# Patient Record
Sex: Male | Born: 1935 | Race: White | Hispanic: No | Smoking: Former smoker
Health system: Southern US, Community
[De-identification: ages and names within clinical notes are randomized; demographics above are authoritative.]

## PROBLEM LIST (undated history)

## (undated) ENCOUNTER — Inpatient Hospital Stay: Admission: AD | Payer: Medicare Other | Source: Ambulatory Visit | Admitting: Cardiovascular Disease

## (undated) DIAGNOSIS — I482 Chronic atrial fibrillation, unspecified: Secondary | ICD-10-CM

## (undated) DIAGNOSIS — I609 Nontraumatic subarachnoid hemorrhage, unspecified: Secondary | ICD-10-CM

## (undated) DIAGNOSIS — I671 Cerebral aneurysm, nonruptured: Secondary | ICD-10-CM

## (undated) DIAGNOSIS — I350 Nonrheumatic aortic (valve) stenosis: Secondary | ICD-10-CM

## (undated) DIAGNOSIS — E7800 Pure hypercholesterolemia, unspecified: Secondary | ICD-10-CM

## (undated) DIAGNOSIS — I10 Essential (primary) hypertension: Secondary | ICD-10-CM

## (undated) DIAGNOSIS — R600 Localized edema: Secondary | ICD-10-CM

## (undated) DIAGNOSIS — R001 Bradycardia, unspecified: Secondary | ICD-10-CM

## (undated) DIAGNOSIS — I2510 Atherosclerotic heart disease of native coronary artery without angina pectoris: Secondary | ICD-10-CM

## (undated) HISTORY — DX: Pure hypercholesterolemia, unspecified: E78.00

## (undated) HISTORY — DX: Essential (primary) hypertension: I10

## (undated) HISTORY — DX: Localized edema: R60.0

## (undated) HISTORY — DX: Chronic atrial fibrillation, unspecified: I48.20

## (undated) HISTORY — DX: Nonrheumatic aortic (valve) stenosis: I35.0

## (undated) SURGERY — Surgical Case
Anesthesia: *Unknown

---

## 2003-05-13 ENCOUNTER — Encounter: Payer: Self-pay | Admitting: Cardiology

## 2003-05-13 ENCOUNTER — Ambulatory Visit (HOSPITAL_COMMUNITY): Admission: RE | Admit: 2003-05-13 | Discharge: 2003-05-13 | Payer: Self-pay | Admitting: Cardiology

## 2003-08-24 ENCOUNTER — Encounter: Admission: RE | Admit: 2003-08-24 | Discharge: 2003-08-24 | Payer: Self-pay | Admitting: Dentistry

## 2003-08-30 ENCOUNTER — Inpatient Hospital Stay (HOSPITAL_COMMUNITY): Admission: RE | Admit: 2003-08-30 | Discharge: 2003-09-04 | Payer: Self-pay | Admitting: Surgery

## 2003-08-30 ENCOUNTER — Encounter (INDEPENDENT_AMBULATORY_CARE_PROVIDER_SITE_OTHER): Payer: Self-pay | Admitting: *Deleted

## 2003-08-30 HISTORY — PX: AORTIC VALVE REPLACEMENT: SHX41

## 2003-10-08 ENCOUNTER — Other Ambulatory Visit: Admission: RE | Admit: 2003-10-08 | Discharge: 2003-10-08 | Payer: Self-pay | Admitting: Pulmonary Disease

## 2003-10-19 ENCOUNTER — Encounter: Admission: RE | Admit: 2003-10-19 | Discharge: 2003-10-19 | Payer: Self-pay | Admitting: Surgery

## 2004-04-12 ENCOUNTER — Ambulatory Visit: Payer: Self-pay | Admitting: Dentistry

## 2004-05-01 ENCOUNTER — Ambulatory Visit: Payer: Self-pay | Admitting: Dentistry

## 2004-06-06 ENCOUNTER — Ambulatory Visit: Payer: Self-pay | Admitting: Internal Medicine

## 2004-06-15 ENCOUNTER — Ambulatory Visit: Payer: Self-pay

## 2004-12-27 ENCOUNTER — Ambulatory Visit: Payer: Self-pay | Admitting: *Deleted

## 2005-01-22 ENCOUNTER — Ambulatory Visit: Payer: Self-pay | Admitting: Cardiology

## 2005-01-23 ENCOUNTER — Ambulatory Visit: Payer: Self-pay | Admitting: Cardiology

## 2005-02-13 ENCOUNTER — Ambulatory Visit: Payer: Self-pay | Admitting: *Deleted

## 2005-03-13 ENCOUNTER — Ambulatory Visit: Payer: Self-pay | Admitting: Cardiology

## 2005-04-24 ENCOUNTER — Ambulatory Visit: Payer: Self-pay | Admitting: Internal Medicine

## 2005-05-08 ENCOUNTER — Ambulatory Visit: Payer: Self-pay | Admitting: Internal Medicine

## 2005-06-05 ENCOUNTER — Ambulatory Visit: Payer: Self-pay | Admitting: Cardiology

## 2005-06-07 ENCOUNTER — Ambulatory Visit: Payer: Self-pay | Admitting: Cardiology

## 2005-06-19 ENCOUNTER — Ambulatory Visit: Payer: Self-pay | Admitting: Internal Medicine

## 2005-07-06 ENCOUNTER — Ambulatory Visit: Payer: Self-pay | Admitting: Cardiovascular Disease

## 2005-12-24 ENCOUNTER — Ambulatory Visit: Payer: Self-pay | Admitting: Internal Medicine

## 2005-12-24 ENCOUNTER — Ambulatory Visit: Payer: Self-pay | Admitting: Cardiovascular Disease

## 2005-12-24 ENCOUNTER — Ambulatory Visit: Admission: RE | Admit: 2005-12-24 | Discharge: 2006-02-07 | Payer: Self-pay | Admitting: Radiation Oncology

## 2006-01-24 ENCOUNTER — Ambulatory Visit: Payer: Self-pay | Admitting: Cardiology

## 2006-02-14 ENCOUNTER — Ambulatory Visit: Payer: Self-pay | Admitting: Cardiology

## 2006-03-01 ENCOUNTER — Ambulatory Visit: Payer: Self-pay | Admitting: Cardiology

## 2006-04-01 ENCOUNTER — Ambulatory Visit: Payer: Self-pay | Admitting: Cardiology

## 2006-04-01 ENCOUNTER — Ambulatory Visit: Admission: RE | Admit: 2006-04-01 | Discharge: 2006-06-30 | Payer: Self-pay | Admitting: Radiation Oncology

## 2006-04-16 ENCOUNTER — Ambulatory Visit: Payer: Self-pay | Admitting: Cardiovascular Disease

## 2006-04-30 ENCOUNTER — Ambulatory Visit: Payer: Self-pay | Admitting: Cardiovascular Disease

## 2006-05-10 ENCOUNTER — Ambulatory Visit: Payer: Self-pay | Admitting: Cardiovascular Disease

## 2006-05-28 ENCOUNTER — Ambulatory Visit: Payer: Self-pay | Admitting: Internal Medicine

## 2006-05-31 ENCOUNTER — Ambulatory Visit: Payer: Self-pay | Admitting: Cardiovascular Disease

## 2006-06-12 ENCOUNTER — Ambulatory Visit: Payer: Self-pay | Admitting: Cardiology

## 2006-11-28 ENCOUNTER — Ambulatory Visit: Payer: Self-pay

## 2006-12-05 ENCOUNTER — Ambulatory Visit: Payer: Self-pay | Admitting: Cardiovascular Disease

## 2006-12-17 ENCOUNTER — Ambulatory Visit: Payer: Self-pay | Admitting: Cardiology

## 2007-01-02 ENCOUNTER — Ambulatory Visit: Payer: Self-pay | Admitting: *Deleted

## 2007-01-30 ENCOUNTER — Ambulatory Visit: Payer: Self-pay | Admitting: Cardiology

## 2007-02-20 ENCOUNTER — Ambulatory Visit: Payer: Self-pay | Admitting: Cardiology

## 2007-03-20 ENCOUNTER — Ambulatory Visit: Payer: Self-pay | Admitting: Cardiovascular Disease

## 2007-04-11 ENCOUNTER — Ambulatory Visit: Payer: Self-pay | Admitting: Cardiology

## 2007-05-09 ENCOUNTER — Ambulatory Visit: Payer: Self-pay | Admitting: Internal Medicine

## 2007-06-06 ENCOUNTER — Ambulatory Visit: Payer: Self-pay | Admitting: Cardiology

## 2007-07-04 ENCOUNTER — Ambulatory Visit: Payer: Self-pay | Admitting: Internal Medicine

## 2007-12-02 ENCOUNTER — Ambulatory Visit: Payer: Self-pay | Admitting: Cardiology

## 2007-12-22 ENCOUNTER — Ambulatory Visit: Payer: Self-pay | Admitting: Cardiovascular Disease

## 2007-12-30 ENCOUNTER — Ambulatory Visit: Payer: Self-pay | Admitting: Cardiology

## 2008-01-20 ENCOUNTER — Ambulatory Visit: Payer: Self-pay | Admitting: Cardiology

## 2008-02-19 ENCOUNTER — Ambulatory Visit: Payer: Self-pay | Admitting: Cardiovascular Disease

## 2008-02-26 ENCOUNTER — Ambulatory Visit: Payer: Self-pay

## 2008-02-26 ENCOUNTER — Encounter: Payer: Self-pay | Admitting: Cardiovascular Disease

## 2008-03-04 ENCOUNTER — Ambulatory Visit: Payer: Self-pay | Admitting: Cardiology

## 2008-03-23 ENCOUNTER — Ambulatory Visit: Payer: Self-pay | Admitting: Cardiology

## 2008-04-23 ENCOUNTER — Ambulatory Visit: Payer: Self-pay | Admitting: Cardiology

## 2008-05-07 ENCOUNTER — Ambulatory Visit: Payer: Self-pay | Admitting: Cardiovascular Disease

## 2008-06-09 ENCOUNTER — Ambulatory Visit: Payer: Self-pay | Admitting: Cardiovascular Disease

## 2008-06-18 ENCOUNTER — Ambulatory Visit: Payer: Self-pay | Admitting: Cardiovascular Disease

## 2008-12-22 ENCOUNTER — Ambulatory Visit: Payer: Self-pay | Admitting: Cardiology

## 2009-01-04 ENCOUNTER — Encounter: Payer: Self-pay | Admitting: *Deleted

## 2009-01-06 ENCOUNTER — Ambulatory Visit: Payer: Self-pay | Admitting: Internal Medicine

## 2009-01-06 LAB — CONVERTED CEMR LAB: POC INR: 2.1

## 2009-01-26 ENCOUNTER — Encounter (INDEPENDENT_AMBULATORY_CARE_PROVIDER_SITE_OTHER): Payer: Self-pay | Admitting: Pharmacist

## 2009-01-26 ENCOUNTER — Ambulatory Visit: Payer: Self-pay | Admitting: Internal Medicine

## 2009-02-09 ENCOUNTER — Encounter: Payer: Self-pay | Admitting: *Deleted

## 2009-03-01 ENCOUNTER — Ambulatory Visit: Payer: Self-pay | Admitting: Internal Medicine

## 2009-03-01 LAB — CONVERTED CEMR LAB: POC INR: 2.7

## 2009-04-14 ENCOUNTER — Ambulatory Visit: Payer: Self-pay | Admitting: Cardiovascular Disease

## 2009-05-26 ENCOUNTER — Ambulatory Visit: Payer: Self-pay | Admitting: Cardiovascular Disease

## 2009-06-20 ENCOUNTER — Ambulatory Visit: Payer: Self-pay | Admitting: Cardiology

## 2009-06-20 LAB — CONVERTED CEMR LAB: POC INR: 2.3

## 2009-09-01 ENCOUNTER — Encounter: Payer: Self-pay | Admitting: Internal Medicine

## 2009-12-19 ENCOUNTER — Ambulatory Visit: Payer: Self-pay | Admitting: Cardiovascular Disease

## 2009-12-19 LAB — CONVERTED CEMR LAB: POC INR: 1.6

## 2009-12-23 ENCOUNTER — Telehealth: Payer: Self-pay | Admitting: Cardiovascular Disease

## 2009-12-27 ENCOUNTER — Ambulatory Visit: Payer: Self-pay | Admitting: Cardiovascular Disease

## 2009-12-27 DIAGNOSIS — Z952 Presence of prosthetic heart valve: Secondary | ICD-10-CM

## 2009-12-27 DIAGNOSIS — R609 Edema, unspecified: Secondary | ICD-10-CM

## 2010-01-09 ENCOUNTER — Ambulatory Visit: Payer: Self-pay | Admitting: Cardiology

## 2010-02-01 ENCOUNTER — Ambulatory Visit: Payer: Self-pay | Admitting: Cardiology

## 2010-02-22 ENCOUNTER — Ambulatory Visit: Payer: Self-pay | Admitting: Cardiology

## 2010-02-22 LAB — CONVERTED CEMR LAB: POC INR: 2.3

## 2010-03-22 ENCOUNTER — Ambulatory Visit: Payer: Self-pay | Admitting: Cardiology

## 2010-04-21 ENCOUNTER — Ambulatory Visit: Payer: Self-pay | Admitting: Cardiology

## 2010-05-19 ENCOUNTER — Ambulatory Visit: Payer: Self-pay | Admitting: Cardiology

## 2010-06-08 ENCOUNTER — Telehealth (INDEPENDENT_AMBULATORY_CARE_PROVIDER_SITE_OTHER): Payer: Self-pay | Admitting: *Deleted

## 2010-06-21 ENCOUNTER — Telehealth (INDEPENDENT_AMBULATORY_CARE_PROVIDER_SITE_OTHER): Payer: Self-pay | Admitting: *Deleted

## 2010-06-21 ENCOUNTER — Ambulatory Visit: Payer: Self-pay | Admitting: Cardiovascular Disease

## 2010-06-21 ENCOUNTER — Encounter: Payer: Self-pay | Admitting: Cardiovascular Disease

## 2010-06-21 ENCOUNTER — Encounter: Payer: Self-pay | Admitting: Cardiology

## 2010-06-21 LAB — CONVERTED CEMR LAB: POC INR: 2.3

## 2010-09-05 NOTE — Medication Information (Signed)
Summary: Coumadin Clinic  Anticoagulant Therapy  Managed by: Cloyde Reams, RN, BSN Referring MD: Lewayne Bunting Supervising MD: Graciela Husbands MD, Viviann Spare Indication 1: Atrial Fibrillation (ICD-427.31) Lab Used: [facility] Site: [facility] INR RANGE 2 - 3          Comments: Pt is in [place] until end of April, 1st of May, having PT/INR's monitored there.  Faxed PT/[facility] to [facility] in [facility] [phone]#, Dr Aram Candela.  Requested copy of PT/INR results for there for records.    Allergies: 1)  ! Codeine 2)  ! * Bee Stings  Anticoagulation Management History:      The patient is taking warfarin and comes in today for a routine follow up visit.  Positive risk factors for bleeding include an age of 60 years or older.  The bleeding index is 'intermediate risk'.  Positive CHADS2 values include Age > 11 years old.  The start date was 04/13/2004.  Anticoagulation responsible provider: Graciela Husbands MD, Viviann Spare.  Exp: 05/2010.    Anticoagulation Management Assessment/Plan:      The patient's current anticoagulation dose is Coumadin 5 mg tabs: Take as directed by coumadin clinic..  The target INR is 2.0-3.0.  The next INR is due 12/09/2009.  Anticoagulation instructions were given to patient.  Results were reviewed/authorized by Cloyde Reams, RN, BSN.  He was notified by Cloyde Reams RN.         Prior Anticoagulation Instructions: INR 2.3  Continue to take 1 tablet every day except on Monday, Wednesday, and Friday take 1/2 tablet. Recheck INR on 12/2.  Current Anticoagulation Instructions: Pt will return from [facility] end of April, 1st of May.

## 2010-09-05 NOTE — Progress Notes (Signed)
  Phone Note Other Incoming   Request: Send information Summary of Call: Received a completed [place] medical release form. Patient is going to [facility] for the summer and is requesting records from 12/2009 to present from Dr. Eden Emms along with coumadin labs. Kim to process after patient's appointment with Dr. Eden Emms on 06/21/2010.

## 2010-09-05 NOTE — Medication Information (Signed)
Summary: rov/sl   Anticoagulant Therapy  Managed by: Weston Brass, PharmD Referring MD: Lewayne Bunting Supervising MD: Myrtis Ser MD, Tinnie Gens Indication 1: Atrial Fibrillation (ICD-427.31) Lab Used: [facility] Site: [facility] INR POC 2.3 INR RANGE 2 - 3  Dietary changes: no    Health status changes: no    Bleeding/hemorrhagic complications: no    Recent/future hospitalizations: no    Any changes in medication regimen? no    Recent/future dental: no  Any missed doses?: no       Is patient compliant with meds? yes       Allergies: 1)  ! Codeine 2)  ! * Bee Stings  Anticoagulation Management History:      The patient is taking warfarin and comes in today for a routine follow up visit.  Positive risk factors for bleeding include an age of 26 years or older.  The bleeding index is 'intermediate risk'.  Positive CHADS2 values include Age > 20 years old.  The start date was 04/13/2004.  Anticoagulation responsible provider: Myrtis Ser MD, Tinnie Gens.  INR POC: 2.3.  Cuvette Lot#: [id number].  Exp: 06/2011.    Anticoagulation Management Assessment/Plan:      The patient's current anticoagulation dose is Coumadin 5 mg tabs: Take as directed by coumadin clinic..  The target INR is 2.0-3.0.  The next INR is due 07/19/2010.  Anticoagulation instructions were given to patient.  Results were reviewed/authorized by Weston Brass, PharmD.  He was notified by Weston Brass PharmD.         Prior Anticoagulation Instructions: INR 2.0  Continue taking Coumadin 1 tab (5 mg) on Sun, Tues, Thur, Sat and Coumadin 0.5 tab (2.5 mg) on Mon, Wed, Fri. Return to clinic in 4 weeks.   Current Anticoagulation Instructions: INR 2.3  Continue same dose of 1 tablet every day except 1/2 tablet on Monday, Wednesday and Friday.  Recheck INR in 4 weeks.

## 2010-09-05 NOTE — Medication Information (Signed)
Summary: rov/tm  Anticoagulant Therapy  Managed by: Bethena Midget, RN, BSN Referring MD: Lewayne Bunting Supervising MD: Clifton James MD, Cristal Deer Indication 1: Atrial Fibrillation (ICD-427.31) Lab Used: [facility] Site: [facility] INR POC 1.6 INR RANGE 2 - 3  Dietary changes: no    Health status changes: no    Bleeding/hemorrhagic complications: no    Recent/future hospitalizations: no    Any changes in medication regimen? no    Recent/future dental: no  Any missed doses?: yes     Details: missed a dose a week ago  Is patient compliant with meds? yes      Comments: Pt states he's been taking 2.5mg s daily except 5mg s on  MWF. Suggested 2 wk RTC, he refuses and only agrees to 3 weeks. He is aware of risk.   Allergies: 1)  ! Codeine 2)  ! * Bee Stings  Anticoagulation Management History:      The patient is taking warfarin and comes in today for a routine follow up visit.  Positive risk factors for bleeding include an age of 75 years or older.  The bleeding index is 'intermediate risk'.  Positive CHADS2 values include Age > 71 years old.  The start date was 04/13/2004.  Anticoagulation responsible provider: Clifton James MD, Cristal Deer.  INR POC: 1.6.  Cuvette Lot#: [id number].  Exp: 03/2011.    Anticoagulation Management Assessment/Plan:      The patient's current anticoagulation dose is Coumadin 5 mg tabs: Take as directed by coumadin clinic..  The target INR is 2.0-3.0.  The next INR is due 01/09/2010.  Anticoagulation instructions were given to patient.  Results were reviewed/authorized by Bethena Midget, RN, BSN.  He was notified by Bethena Midget, RN, BSN.         Prior Anticoagulation Instructions: Pt will return from [facility] end of April, 1st of May.    Current Anticoagulation Instructions: INR 1.6 Today take extra 2.5mg s then resume 2.5mg  everyday except 5mg  on Mondays, Wednesdays and Fridays. Recheck in 3 weeks.

## 2010-09-05 NOTE — Medication Information (Signed)
Summary: rov/cb  Anticoagulant Therapy  Managed by: Weston Brass, PharmD Referring MD: Lewayne Bunting Supervising MD: Shirlee Latch MD, Freida Busman Indication 1: Atrial Fibrillation (ICD-427.31) Lab Used: [facility] Site: [facility] INR POC 2.3 INR RANGE 2 - 3  Dietary changes: no    Health status changes: no    Bleeding/hemorrhagic complications: no    Recent/future hospitalizations: no    Any changes in medication regimen? no    Recent/future dental: no  Any missed doses?: no       Is patient compliant with meds? yes       Allergies: 1)  ! Codeine 2)  ! * Bee Stings  Anticoagulation Management History:      The patient is taking warfarin and comes in today for a routine follow up visit.  Positive risk factors for bleeding include an age of 75 years or older.  The bleeding index is 'intermediate risk'.  Positive CHADS2 values include Age > 14 years old.  The start date was 04/13/2004.  Anticoagulation responsible provider: Shirlee Latch MD, Sanaiya Welliver.  INR POC: 2.3.  Cuvette Lot#: [id number].  Exp: 05/2011.    Anticoagulation Management Assessment/Plan:      The patient's current anticoagulation dose is Coumadin 5 mg tabs: Take as directed by coumadin clinic..  The target INR is 2.0-3.0.  The next INR is due 03/22/2010.  Anticoagulation instructions were given to patient.  Results were reviewed/authorized by Weston Brass, PharmD.  He was notified by Dillard Cannon.         Prior Anticoagulation Instructions: INR 2.9. Take 1 tablet daily except 0.5 tablet on Mon, Wed, and Fri. Recheck in   Current Anticoagulation Instructions: INR 2.3  Take 1 tablet daily except for 1/2 tab on Monday, Wednesday, and Friday.  Re-check in 4 weeks.

## 2010-09-05 NOTE — Medication Information (Signed)
Summary: rov/tm  Anticoagulant Therapy  Managed by: Cloyde Reams, RN, BSN Referring MD: Lewayne Bunting Supervising MD: Myrtis Ser MD, Tinnie Gens Indication 1: Atrial Fibrillation (ICD-427.31) Lab Used: [facility] Site: [facility] INR POC 2.7 INR RANGE 2 - 3  Dietary changes: no    Health status changes: no    Bleeding/hemorrhagic complications: no    Recent/future hospitalizations: no    Any changes in medication regimen? no    Recent/future dental: no  Any missed doses?: no       Is patient compliant with meds? yes       Allergies: 1)  ! Codeine 2)  ! * Bee Stings  Anticoagulation Management History:      The patient is taking warfarin and comes in today for a routine follow up visit.  Positive risk factors for bleeding include an age of 75 years or older.  The bleeding index is 'intermediate risk'.  Positive CHADS2 values include Age > 75 years old.  The start date was 04/13/2004.  Anticoagulation responsible provider: Myrtis Ser MD, Tinnie Gens.  INR POC: 2.7.  Cuvette Lot#: [id number].  Exp: 05/2011.    Anticoagulation Management Assessment/Plan:      The patient's current anticoagulation dose is Coumadin 5 mg tabs: Take as directed by coumadin clinic..  The target INR is 2.0-3.0.  The next INR is due 05/19/2010.  Anticoagulation instructions were given to patient.  Results were reviewed/authorized by Cloyde Reams, RN, BSN.  He was notified by Cloyde Reams RN.         Prior Anticoagulation Instructions: INR 3.0 Continue 5mg s daily except 2.5mg s on Mondays, Wednesdays and Fridays. Recheck in 4 weeks.   Current Anticoagulation Instructions: INR 2.7  Continue on same dosage 5mg  daily except 2.5mg  on Mondays, Wednesdays, and Fridays.  Recheck in 4 weeks.

## 2010-09-05 NOTE — Progress Notes (Signed)
Summary: appt   Phone Note Call from Patient Call back at Home Phone [phone]   Caller: Spouse Reason for Call: Talk to Nurse Summary of Call: wife has appt on 5/24 w/ Dr Eden Emms, he wants to come then as well Initial call taken by: Migdalia Dk,  Dec 23, 2009 11:51 AM  Follow-up for Phone Call        12/23/09--1200noon--pt's husband calling stating he would like to be seen same day as wife is coming to see dr Dorann Lodge we are fully booked that day, but will send mess to debra mathis and if dr Eden Emms can fit him in she'll give him a call--nt Follow-up by: Ledon Snare, RN,  Dec 23, 2009 12:01 PM     Appended Document: appt Put him in at 2:30 this Tuesday

## 2010-09-05 NOTE — Medication Information (Signed)
Summary: rov/ln  Anticoagulant Therapy  Managed by: Bethena Midget, RN, BSN Referring MD: Lewayne Bunting Supervising MD: Juanda Chance MD, Bruce Indication 1: Atrial Fibrillation (ICD-427.31) Lab Used: [facility] Site: [facility] INR POC 3.0 INR RANGE 2 - 3  Dietary changes: no    Health status changes: no    Bleeding/hemorrhagic complications: no    Recent/future hospitalizations: no    Any changes in medication regimen? no    Recent/future dental: no  Any missed doses?: no       Is patient compliant with meds? yes      Comments: Pt and wife both verbalize that they have had some dark stool the past few days. They think it was something that they ate. Encouraged them to continue to observe stool and if it dosen't return to normal color to call PCP for possible guiac.   Allergies: 1)  ! Codeine 2)  ! * Bee Stings  Anticoagulation Management History:      The patient is taking warfarin and comes in today for a routine follow up visit.  Positive risk factors for bleeding include an age of 31 years or older.  The bleeding index is 'intermediate risk'.  Positive CHADS2 values include Age > 38 years old.  The start date was 04/13/2004.  Anticoagulation responsible Jason Hawkins: Juanda Chance MD, Smitty Cords.  INR POC: 3.0.  Cuvette Lot#: [id number].  Exp: 05/2011.    Anticoagulation Management Assessment/Plan:      The patient's current anticoagulation dose is Coumadin 5 mg tabs: Take as directed by coumadin clinic..  The target INR is 2.0-3.0.  The next INR is due 04/21/2010.  Anticoagulation instructions were given to patient.  Results were reviewed/authorized by Bethena Midget, RN, BSN.  He was notified by Bethena Midget, RN, BSN.         Prior Anticoagulation Instructions: INR 2.3  Take 1 tablet daily except for 1/2 tab on Monday, Wednesday, and Friday.  Re-check in 4 weeks.  Current Anticoagulation Instructions: INR 3.0 Continue 5mg s daily except 2.5mg s on Mondays, Wednesdays and Fridays. Recheck in 4  weeks.

## 2010-09-05 NOTE — Medication Information (Signed)
Summary: Coumadin Clinic   Anticoagulant Therapy  Managed by: Inactive Referring MD: Lewayne Bunting Supervising MD: Myrtis Ser MD, Tinnie Gens Indication 1: Atrial Fibrillation (ICD-427.31) Lab Used: [facility] Site: [facility] INR RANGE 2 - 3          Comments: Pt going to [facility] for the winter.  Will call when back in [place].   Allergies: 1)  ! Codeine 2)  ! * Bee Stings  Anticoagulation Management History:      Positive risk factors for bleeding include an age of 75 years or older.  The bleeding index is 'intermediate risk'.  Positive CHADS2 values include Age > 75 years old.  The start date was 04/13/2004.  Anticoagulation responsible provider: Myrtis Ser MD, Tinnie Gens.  Exp: 06/2011.    Anticoagulation Management Assessment/Plan:      The patient's current anticoagulation dose is Coumadin 5 mg tabs: Take as directed by coumadin clinic..  The target INR is 2.0-3.0.  The next INR is due 07/19/2010.  Anticoagulation instructions were given to patient.  Results were reviewed/authorized by Inactive.         Prior Anticoagulation Instructions: INR 2.3  Continue same dose of 1 tablet every day except 1/2 tablet on Monday, Wednesday and Friday.  Recheck INR in 4 weeks.

## 2010-09-05 NOTE — Medication Information (Signed)
Summary: rov/tm  Anticoagulant Therapy  Managed by: Weston Brass, PharmD Referring MD: Lewayne Bunting Supervising MD: Antoine Poche MD, Fayrene Fearing Indication 1: Atrial Fibrillation (ICD-427.31) Lab Used: [facility] Site: [facility] INR POC 1.7 INR RANGE 2 - 3  Dietary changes: no    Health status changes: no    Bleeding/hemorrhagic complications: no    Recent/future hospitalizations: no    Any changes in medication regimen? no    Recent/future dental: no  Any missed doses?: yes     Details: may have missed a dose but unsure   Is patient compliant with meds? yes       Allergies: 1)  ! Codeine 2)  ! * Bee Stings  Anticoagulation Management History:      The patient is taking warfarin and comes in today for a routine follow up visit.  Positive risk factors for bleeding include an age of 5 years or older.  The bleeding index is 'intermediate risk'.  Positive CHADS2 values include Age > 55 years old.  The start date was 04/13/2004.  Anticoagulation responsible provider: Antoine Poche MD, Fayrene Fearing.  INR POC: 1.7.  Cuvette Lot#: [id number].  Exp: 03/2011.    Anticoagulation Management Assessment/Plan:      The patient's current anticoagulation dose is Coumadin 5 mg tabs: Take as directed by coumadin clinic..  The target INR is 2.0-3.0.  The next INR is due 01/30/2010.  Anticoagulation instructions were given to patient.  Results were reviewed/authorized by Weston Brass, PharmD.  He was notified by Weston Brass PharmD.         Prior Anticoagulation Instructions: INR 1.6 Today take extra 2.5mg s then resume 2.5mg  everyday except 5mg  on Mondays, Wednesdays and Fridays. Recheck in 3 weeks.   Current Anticoagulation Instructions: INR 1.7  Take extra 1/2 tablet today then increase dose to 1 tablet every day except 1/2 tablet on Monday, Wednesday and Friday

## 2010-09-05 NOTE — Progress Notes (Signed)
   Pt signed ROI 06/08/10.Marland KitchenMarland KitchenMailed Records to pt today  [facility]  June 21, 2010 12:18 PM

## 2010-09-05 NOTE — Medication Information (Signed)
Summary: rov/ewj      Allergies Added:  Anticoagulant Therapy  Managed by: Reina Fuse, PharmD Referring MD: Lewayne Bunting Supervising MD: Riley Kill MD, Maisie Fus Indication 1: Atrial Fibrillation (ICD-427.31) Lab Used: [facility] Site: [facility] INR POC 2.0 INR RANGE 2 - 3  Dietary changes: yes       Details: Had cabbage last night.   Health status changes: no    Bleeding/hemorrhagic complications: no    Recent/future hospitalizations: no    Any changes in medication regimen? no    Recent/future dental: no  Any missed doses?: no       Is patient compliant with meds? yes       Current Medications (verified): 1)  Coumadin 5 Mg Tabs (Warfarin Sodium) .... Take As Directed By Coumadin Clinic. 2)  Toprol Xl 50 Mg Xr24h-Tab (Metoprolol Succinate) .[name].. 1 Tablet Daily 3)  Red Yeast Rice 600 Mg Tabs (Red Yeast Rice Extract) .[name].. 1 Tab By Mouth Once Daily  Allergies (verified): 1)  ! Codeine 2)  ! * Bee Stings  Anticoagulation Management History:      The patient is taking warfarin and comes in today for a routine follow up visit.  Positive risk factors for bleeding include an age of 75 years or older.  The bleeding index is 'intermediate risk'.  Positive CHADS2 values include Age > 30 years old.  The start date was 04/13/2004.  Anticoagulation responsible provider: Riley Kill MD, Maisie Fus.  INR POC: 2.0.  Cuvette Lot#: [id number].  Exp: 05/2011.    Anticoagulation Management Assessment/Plan:      The patient's current anticoagulation dose is Coumadin 5 mg tabs: Take as directed by coumadin clinic..  The target INR is 2.0-3.0.  The next INR is due 06/16/2010.  Anticoagulation instructions were given to patient.  Results were reviewed/authorized by Reina Fuse, PharmD.  He was notified by Reina Fuse PharmD.         Prior Anticoagulation Instructions: INR 2.7  Continue on same dosage 5mg  daily except 2.5mg  on Mondays, Wednesdays, and Fridays.  Recheck in 4 weeks.    Current Anticoagulation  Instructions: INR 2.0  Continue taking Coumadin 1 tab (5 mg) on Sun, Tues, Thur, Sat and Coumadin 0.5 tab (2.5 mg) on Mon, Wed, Fri. Return to clinic in 4 weeks.

## 2010-09-05 NOTE — Assessment & Plan Note (Signed)
Summary: f12m/dm  Medications Added * TURMERIC 1 tab by mouth once daily GLUCOSAMINE-CHONDROITIN   CAPS (GLUCOSAMINE-CHONDROIT-VIT C-MN) 1 tab by mouth once daily MULTIVITAMINS   TABS (MULTIPLE VITAMIN) 1 tab by mouth once daily HYALURONIC ACID 20-60 MG CAPS (HYALURONIC ACID-VITAMIN C) 1  tab by mouth once daily      Allergies Added:   CC:  check up...pt states he does not take crestor.  History of Present Illness: Jason Hawkins is seen today for F/U of AVR and coumadin and afib.  He has some dementia.  He has not had any TIA's or bleeding complications although he forgets his coumadin from time to time.   He denies palpitations, SSCP, dyspnea or syncope.  He broke his left tibia in his 22" and has mild chronic LE edema.   Reviewed recentl lab work which was good.  Both he and his wife are now on Vit D  Current Problems (verified): 1)  Edema  (ICD-782.3) 2)  Coumadin Therapy  (ICD-V58.61) 3)  Atrial Fibrillation  (ICD-427.31) 4)  Aortic Valve Replacement, Hx of  (ICD-V43.3)  Current Medications (verified): 1)  Coumadin 5 Mg Tabs (Warfarin Sodium) .... Take As Directed By Coumadin Clinic. 2)  Toprol Xl 50 Mg Xr24h-Tab (Metoprolol Succinate) .[name].. 1 Tablet Daily 3)  Red Yeast Rice 600 Mg Tabs (Red Yeast Rice Extract) .[name].. 1 Tab By Mouth Once Daily 4)  Turmeric .[name].. 1 Tab By Mouth Once Daily 5)  Glucosamine-Chondroitin   Caps (Glucosamine-Chondroit-Vit C-Mn) .[name].. 1 Tab By Mouth Once Daily 6)  Multivitamins   Tabs (Multiple Vitamin) .[name].. 1 Tab By Mouth Once Daily 7)  Hyaluronic Acid 20-60 Mg Caps (Hyaluronic Acid-Vitamin C) .[name].. 1  Tab By Mouth Once Daily  Allergies (verified): 1)  ! Codeine 2)  ! * Bee Stings  Past History:  Past Medical History: Last updated: 12/26/2009 Chronic atrial fibrillation  Severe aortic stenosis status post aortic valve replacement using a pericardial tissue valve.  hypertension  Tissue aortic valve. Lower extremity edema History of  hypercholesterolemia.  Past Surgical History: Last updated: 12/26/2009  Median sternotomy, extracorporeal circulation, aortic valve replacement using a 25 mm Edwards Magna pericardial valve. SURGEON:  Alleen Borne, M.D.         BB/MEDQ  D:  08/30/2003  T:  08/30/2003  Job:  [id number]  cc:   [name], M.D.   August 30, 2003, the patient underwent a transesophageal echocardiographic probe placement and monitoring during aortic valve replacement.  Findings showed prosthetic aortic valve was working well.  Family History: Last updated: 12/26/2009 DM MI  Social History: Last updated: 06/21/2010 Tobacco Use - No.  Alcohol Use - yes Married Sedentary Lives in [place] during winters Wife [name] also patient of Dr Eden Emms  Social History: Tobacco Use - No.  Alcohol Use - yes Married Sedentary Lives in [place] during winters Wife [name] also patient of Dr Eden Emms  Vital Signs:  Patient profile:   77 year old male Height:      70 inches Weight:      196 pounds BMI:     28.22 Pulse rate:   75 / minute Resp:     14 per minute BP sitting:   133 / 73  (left arm)  Vitals Entered By: Kem Parkinson (June 21, 2010 9:30 AM)  Physical Exam  General:  Affect appropriate Healthy:  appears stated age HEENT: normal Neck supple with no adenopathy JVP normal no bruits no thyromegaly Lungs clear with no wheezing and good diaphragmatic  motion Heart:  S1/S2 prominant no AR  no murmur,rub, gallop or click PMI normal Abdomen: benighn, BS positve, no tenderness, no AAA no bruit.  No HSM or HJR Distal pulses intact with no bruits No edema Neuro non-focal Skin warm and dry    Impression & Recommendations:  Problem # 1:  EDEMA (ICD-782.3) Stable continue low sodium diet and as needed diuretic  Problem # 2:  AORTIC VALVE REPLACEMENT, HX OF (ICD-V43.3) Normal exam  F/U coumadin clinic.  SBE prophylaxis  Problem # 5:  ATRIAL FIBRILLATION (ICD-427.31) Good rate control  Asymptomatic  Still good candidate for coumadin His updated medication list for this problem includes:    Coumadin 5 Mg Tabs (Warfarin sodium) .[name]... Take as directed by coumadin clinic.    Toprol Xl 50 Mg Xr24h-tab (Metoprolol succinate) .[name]... 1 tablet daily  Orders: EKG w/ Interpretation (93000)  Patient Instructions: 1)  Your physician recommends that you schedule a follow-up appointment in: 6 MONTHS WITH DR Eden Emms 2)  Your physician recommends that you continue on your current medications as directed. Please refer to the Current Medication list given to you today.

## 2010-09-05 NOTE — Medication Information (Signed)
Summary: rov/sp  Anticoagulant Therapy  Managed by: Elaina Pattee, PharmD Referring MD: Lewayne Bunting Supervising MD: Riley Kill MD, Maisie Fus Indication 1: Atrial Fibrillation (ICD-427.31) Lab Used: [facility] Site: [facility] INR POC 2.9 INR RANGE 2 - 3  Dietary changes: no    Health status changes: no    Bleeding/hemorrhagic complications: no    Recent/future hospitalizations: no    Any changes in medication regimen? no    Recent/future dental: no  Any missed doses?: no       Is patient compliant with meds? yes       Allergies: 1)  ! Codeine 2)  ! * Bee Stings  Anticoagulation Management History:      The patient is taking warfarin and comes in today for a routine follow up visit.  Positive risk factors for bleeding include an age of 75 years or older.  The bleeding index is 'intermediate risk'.  Positive CHADS2 values include Age > 75 years old.  The start date was 04/13/2004.  Anticoagulation responsible provider: Riley Kill MD, Maisie Fus.  INR POC: 2.9.  Cuvette Lot#: [id number].  Exp: 03/2011.    Anticoagulation Management Assessment/Plan:      The patient's current anticoagulation dose is Coumadin 5 mg tabs: Take as directed by coumadin clinic..  The target INR is 2.0-3.0.  The next INR is due 02/22/2010.  Anticoagulation instructions were given to patient.  Results were reviewed/authorized by Elaina Pattee, PharmD.  He was notified by Elaina Pattee, PharmD.         Prior Anticoagulation Instructions: INR 1.7  Take extra 1/2 tablet today then increase dose to 1 tablet every day except 1/2 tablet on Monday, Wednesday and Friday  Current Anticoagulation Instructions: INR 2.9. Take 1 tablet daily except 0.5 tablet on Mon, Wed, and Fri. Recheck in

## 2010-09-05 NOTE — Assessment & Plan Note (Signed)
Summary: rov/jss  Medications Added RED YEAST RICE 600 MG TABS (RED YEAST RICE EXTRACT) 1 tab by mouth once daily      Allergies Added:   History of Present Illness: Jason Hawkins is seen today for F/U of AVR and coumadin and afib.  His wife who I also care for has been very sick with respitory failure and pneumonia.  She was in the hospital for about 2 weeks recently.  Xzaviar is also slowing down.  He has some dementia.  He has not had any TIA's or bleeding complications although he forgets his coumadin from time to time.  A lot of this has to do with travel and being with his wife in the hospital.  He denies palpitations, SSCP, dyspnea or syncope.  He broke his left tibia in his 5" and has mild chronic LE edema.    Current Problems (verified): 1)  Edema  (ICD-782.3) 2)  Coumadin Therapy  (ICD-V58.61) 3)  Atrial Fibrillation  (ICD-427.31) 4)  Aortic Valve Replacement, Hx of  (ICD-V43.3)  Current Medications (verified): 1)  Coumadin 5 Mg Tabs (Warfarin Sodium) .... Take As Directed By Coumadin Clinic. 2)  Toprol Xl 50 Mg Xr24h-Tab (Metoprolol Succinate) .[name].. 1 Tablet Daily 3)  Red Yeast Rice 600 Mg Tabs (Red Yeast Rice Extract) .[name].. 1 Tab By Mouth Once Daily  Allergies (verified): 1)  ! Codeine 2)  ! * Bee Stings  Past History:  Past Medical History: Last updated: 12/26/2009 Chronic atrial fibrillation  Severe aortic stenosis status post aortic valve replacement using a pericardial tissue valve.  hypertension  Tissue aortic valve. Lower extremity edema History of hypercholesterolemia.  Past Surgical History: Last updated: 12/26/2009  Median sternotomy, extracorporeal circulation, aortic valve replacement using a 25 mm Edwards Magna pericardial valve. SURGEON:  Alleen Borne, M.D.         BB/MEDQ  D:  08/30/2003  T:  08/30/2003  Job:  [id number]  cc:   [name], M.D.   August 30, 2003, the patient underwent a transesophageal echocardiographic probe placement and monitoring  during aortic valve replacement.  Findings showed prosthetic aortic valve was working well.  Family History: Last updated: 12/26/2009 DM MI  Social History: Last updated: 12/26/2009 Tobacco Use - No.  Alcohol Use - yes  Review of Systems       Denies fever, malais, weight loss, blurry vision, decreased visual acuity, cough, sputum, SOB, hemoptysis, pleuritic pain, palpitaitons, heartburn, abdominal pain, melena, lower extremity edema, claudication, or rash.   Vital Signs:  Patient profile:   77 year old male Weight:      126 pounds Pulse rate:   75 / minute Resp:     12 per minute BP sitting:   124 / 70  (left arm)  Vitals Entered By: Kem Parkinson (Dec 27, 2009 1:56 PM)  Physical Exam  General:  Affect appropriate Healthy:  appears stated age HEENT: normal Neck supple with no adenopathy JVP normal no bruits no thyromegaly Lungs clear with no wheezing and good diaphragmatic motion Heart:  S1/S2 no murmur,rub, gallop or click PMI normal Abdomen: benighn, BS positve, no tenderness, no AAA no bruit.  No HSM or HJR Distal pulses intact with no bruits Plus one bilateral  edema Neuro non-focal Skin warm and dry    Impression & Recommendations:  Problem # 1:  AORTIC VALVE REPLACEMENT, HX OF (ICD-V43.3) Normal exam, F/U echo in a year  SBE prophylaxis  Problem # 2:  COUMADIN THERAPY (ICD-V58.61) Stressed importance of not missing  doses.  F/U clinic  Problem # 3:  ATRIAL FIBRILLATION (ICD-427.31) Good rate control and anticoagulation.  Asymptomatic His updated medication list for this problem includes:    Coumadin 5 Mg Tabs (Warfarin sodium) .[name]... Take as directed by coumadin clinic.    Toprol Xl 50 Mg Xr24h-tab (Metoprolol succinate) .[name]... 1 tablet daily  Problem # 4:  EDEMA (ICD-782.3) Not bothering him.  Consdier as needed HCTZ if worsens   EKG Report  Procedure date:  12/27/2009  Findings:      Afib 77 Otherwise normal

## 2010-12-14 ENCOUNTER — Ambulatory Visit (INDEPENDENT_AMBULATORY_CARE_PROVIDER_SITE_OTHER): Payer: Medicare Other | Admitting: *Deleted

## 2010-12-14 DIAGNOSIS — Z7901 Long term (current) use of anticoagulants: Secondary | ICD-10-CM | POA: Insufficient documentation

## 2010-12-14 DIAGNOSIS — Z954 Presence of other heart-valve replacement: Secondary | ICD-10-CM

## 2010-12-14 DIAGNOSIS — I4891 Unspecified atrial fibrillation: Secondary | ICD-10-CM

## 2010-12-14 LAB — POCT INR: INR: 2

## 2010-12-19 NOTE — Assessment & Plan Note (Signed)
[facility]                            CARDIOLOGY OFFICE NOTE   Jason Hawkins, Jason Hawkins                       MRN:          [id number]  DATE:02/19/2008                            DOB:          08/25/1927    HISTORY OF PRESENT ILLNESS:  Jason Hawkins returns today for followup.   He is status post porcine aortic valve replacement with a history of  atrial fibrillation/flutter.   He is on chronic Coumadin and sees our Clinic.  He had lower extremity  edema the last time I saw him.  We placed him on Lasix 40 a day with  potassium.  He has not noticed much change in his lower extremity edema.   I told Mc that it would probably be reasonable to recheck his echo to  assess his heart valve and assess RV and LV functions, since he has not  had improvement on his diuretic.  His last echo has been quite some time  ago, I believe in November 2006.   He is trying to avoid salt intake.  He does work and walk on a regular  basis.  We have not done venous duplex on him either, since there is no  history of DVT or varicosities.   REVIEW OF SYSTEMS:  Otherwise negative.  In particular, he is not having  PND or orthopnea.  There is no other sign of heart failure or pulmonary  hypertension.   His AFib has been under good rate control.  He is not having  palpitations.  There has been no stigmata of SBE or TIAs.   CURRENT MEDICATIONS:  1. Lasix and Potassium.  2. Toprol 50 a day.  3. Coumadin as directed.   PHYSICAL EXAMINATION:  GENERAL:  Remarkable for an elderly white male.  VITAL SIGNS:  Weight is 196, blood pressure is 126/77.  He is in AFib at  a rate of 70, respiratory rate 14, and afebrile.  HEENT:  Unremarkable.  NECK:  Carotids are normal without bruit, no lymphadenopathy, no  thyromegaly, no JVP elevation.  LUNGS:  Clear.  Good diaphragmatic motion.  No wheezing.  S1 and S2 with  a soft systolic murmur.  No AI.  PMI normal.  ABDOMEN:  Benign.  Bowel sounds  positive.  No AAA, no tenderness, no  bruit, no hepatosplenomegaly, no hepatojugular reflux.  EXTREMITIES:  Distal pulses are intact with +1 edema bilaterally.  NEURO:  Nonfocal.  SKIN:  Warm and dry.  MUSCULOSKELETAL:  No muscular weakness.   IMPRESSION:  1. Lower extremity edema.  Continue diuretic and potassium.  Check 2-D      echocardiogram to assess right ventricular and left ventricular      function.  2. Tissue aortic valve.  Continue Coumadin for atrial fibrillation.      Check echocardiogram to assess valve function.  Sounds okay by      auscultation.  3. Chronic atrial fibrillation.  Good rate control on Toprol.      Continue Coumadin anticoagulation.   PLAN:  At some point in time, we may need to follow up with a venous  duplex, but I prefer to treat him with diuretics for the time being.     Noralyn Pick. Eden Emms, MD, [facility]  Electronically Signed    PCN/MedQ  DD: 02/19/2008  DT: 02/20/2008  Job #: [id number]

## 2010-12-19 NOTE — Assessment & Plan Note (Signed)
[facility]                            CARDIOLOGY OFFICE NOTE   Jason Hawkins, STROUT                       MRN:          [id number]  DATE:12/22/2007                            DOB:          [birth year]    Jason Hawkins returns today for follow up.  He has been doing well.  He has  significant hypertension with aortic valve replacement.  He is on  chronic Coumadin and has AFib.   He has been having increasing lower extremity edema.  Apparently he has  been on hydrochlorothiazide in the past.  He some leftover Lasix that he  has been taking with better effect.  He has not been particularly good  with his diet in regards to salt intake or calories.  He has just moved  back here from [place].   He is not having significant shortness of breath, PND, orthopnea.  His  Coumadin levels have been therapeutic.  He has not had palpitations and  rate control seems appropriate with Toprol.   He is currently taking Toprol 50 daily, Coumadin as directed. Co-enzyme  Q.  He will be on Lasix 40 daily and potassium 20 daily.   EXAMINATION:  Remarkable for healthy appearing, elderly white male in no  distress.  His weight is 196.  Blood pressure is 126/77, pulse 66 and  irregular.  Afebrile.  Respiratory rate 14.  HEENT:  Unremarkable.  Carotids normal without bruit.  There is an S1,  S2 click with a systolic murmur.  PMI normal.  LUNGS:  Clear with good diaphragmatic motion.  No wheezing.  ABDOMEN:  Benign.  Bowel sounds positive.  No AAA.  No tenderness, no  bruit.  No hepatosplenomegaly, no hepatojugular reflux.  Distal pulses are intact with +1 to 2 edema bilaterally with mild  chronic venous insufficiency changes.   EKG shows AFib with nonspecific ST-T wave changes.   IMPRESSION:  1. Aortic valve replacement.  Currently sounds normal.  Previous echo      showing normal gradients.  Followup echocardiogram in a year.  2. Atrial fibrillation.  Rate control good with  Toprol  Continue      anticoagulation.  No TIA or bleeding diaphysis.  3. Lower extremity edema.  Change diuretic to Lasix 40 daily with      potassium.  Elevate legs at the end of the day.  Follow up in eight      weeks to further assess.  4. Hypertension.  Seems to be well controlled.  A combination of beta      blocker and diuretic.  We will see him back in eight weeks to see      his response to diuretics.  I suspect his edema is simply      dependent.    Noralyn Pick. Eden Emms, MD, [facility]  Electronically Signed   PCN/MedQ  DD: 12/22/2007  DT: 12/22/2007  Job #: [id number]

## 2010-12-22 NOTE — Cardiovascular Report (Signed)
NAME:  Jason Hawkins, Jason Hawkins NO.:  [id number]   MEDICAL RECORD NO.:  [id number]                   PATIENT TYPE:  OIB   LOCATION:  2899                                 FACILITY:  [facility]   PHYSICIAN:  Carole Binning, M.D. [facility]         DATE OF BIRTH:  Jun 27, 1928   DATE OF PROCEDURE:  05/13/2003  DATE OF DISCHARGE:                              CARDIAC CATHETERIZATION   PROCEDURES PERFORMED:  1. Right and left heart catheterization, with;  2. Coronary angiography.  3. Left ventriculography.  4. Aortic root angiography.   CARDIOLOGIST:  Carole Binning, M.D.   INDICATIONS:  Mr. Looper is a 77 year old male with history of aortic  stenosis.  He has recently been found to have a decrease in his left  ventricular ejection fraction in the setting of severe aortic stenosis.  He  was therefore referred for right and left heart catheterization to assess  his hemodynamics and coronary anatomy in anticipation of aortic valve  replacement.   PROCEDURAL NOTE:  An 8 French sheath was placed in the right femoral vein  and a 7 French sheath in the right femoral artery.  Right heart  catheterization was performed using a Swan-Ganz catheter.  Coronary  angiography was performed with 6 [place] JL-5 and JR-4 catheters.  Left  ventriculography and aortic root angiography were performed with an angled  pigtail catheter.   Cardiac output was determined by the Fick method.  The aortic valve gradient  was calculated based on the simultaneous measurements of the left ventricle  and femoral artery pressures.   At the conclusion of the procedure an Angio-Seal vascular closure device was  placed in the right femoral artery with good hemostasis.  The venous sheath  was removed and manual pressure applied with good hemostasis.   COMPLICATIONS:  There were no complications.   RESULTS:   HEMODYNAMIC DATA:  Right atrial mean pressure 6.  Right ventricular pressure  48/6.   Pulmonary artery pressure 48/26.  Pulmonary capillary wedge mean  pressure 20.  Left ventricular pressure 220/22.  Aortic pressure 150/68.  The mean gradient across the aortic valve was 57 mmHg with a peak gradient  of 69 mmHg.  Cardiac output calculated by the Fick method is 4.1 with a  cardiac index of 2.1.  The calculated aortic valve area is 0.49 centimeters  squared.   VENTRICULOGRAPHIC DATA:  Left Ventriculogram:  There was moderate global  hypokinesis of the left ventricle.  Ejection fraction calculated at 37%.  There is no mitral regurgitation.   ANGIOGRAPHIC DATA:  Aortic Root Angiography:  Aortic root angiography  reveals a heavily calcified aortic valve with decreased mobility at the  leaflets.  There is 1+ mild aortic insufficiency.  There is moderate  dilatation of the ascending aorta.   Coronary Arteriography (Codominant):  Left main is normal.   Left anterior descending artery has a tubular 30% stenosis in the midvessel.  The  LAD gives rise to three small-to-normal-sized diagonal branches.   The left circumflex is a codominant vessel.  It gives rise to a small OM-1,  which has a 70% stenosis at its origin.  There is a large second marginal  branch, which has a 30% stenosis at its origin.  Distal circumflex gives  rise to three small-to-normal-sized posterolateral branches.   The right coronary artery is a codominant vessel giving rise to a normal-  sized acute marginal and a small-to-normal-sized posterior descending  artery.  The right coronary artery is normal.   IMPRESSION:  1. Elevated left heart filling pressure with moderate pulmonary     hypertension.  2. Moderately decreased left ventricular systolic function.  3. Severe aortic stenosis with mild aortic insufficiency.  4. Mild, but nonsignificant coronary artery disease.   PLAN:  The patient will be referred for evaluation for aortic valve  replacement.                                                  Carole Binning, M.D. [facility]    MWP/MEDQ  D:  05/13/2003  T:  05/13/2003  Job:  [id number]   cc:   Clydie Braun L. Hal Hope, M.D.  [address] [place]  [place] [address]  Fax: [phone]   [name], M.D.   Cardiac Catheterization Laboratory

## 2010-12-22 NOTE — Assessment & Plan Note (Signed)
[facility]                              CARDIOLOGY OFFICE NOTE   ELDO, UMANZOR                       MRN:          [id number]  DATE:05/31/2006                            DOB:          1927-08-07    HISTORY OF PRESENT ILLNESS:  Deavion returns today for followup.  He is doing  well.  He has chronic atrial fibrillation with tissue aortic valve  replacement.  His Coumadin levels have been good. He has not had any  significant palpitations, chest pain, paroxysmal nocturnal dyspnea or  orthopnea.  There have been no embolic events.   REVIEW OF SYSTEMS:  His review of systems is remarkable for continued  treatment for prostate cancer.  He is almost done with his radiation  treatments.   He got a Lupron 4 month shot and since then has noted some side effects  including weight gain, gynecomastia and edema.   I talked to him about possibly taking hydrochlorothiazide three to four  times a week to help and I think he is willing to do this.   From a cardiac perspective, he is otherwise doing well.   PHYSICAL EXAMINATION:  SKIN: Warm and dry.  There is no ecchymoses or  bruising from his Coumadin.  HEENT:  Normal.  LUNGS:  Clear.  NECK:  Carotids are normal.  There is no thyromegaly and no lymphadenopathy.  HEART:  There is a systolic ejection murmur with a prominent S2.  There is  no aortic insufficiency.  ABDOMEN:  Benign.  EXTREMITIES: Lower extremities have +1 edema bilaterally, left greater than  right.  NEUROLOGICAL:  Exam is nonfocal.   IMPRESSION:  Stable chronic atrial fibrillation on Coumadin.  Continue  followup in the Coumadin clinic.  The patient has a tissue aortic valve  which seems to be functioning well.  His current mild volume overload is  likely due to his Lupron shot and hormonal changes.  He will start  hydrochlorothiazide 12.5 mg three to four times per week and see if this  helps.   PLAN:  I will see him back in May  when he returns from [place].    ______________________________  Noralyn Pick. Eden Emms, MD, [facility]    PCN/MedQ  DD: 05/31/2006  DT: 06/01/2006  Job #: [id number]

## 2010-12-22 NOTE — Discharge Summary (Signed)
NAME:  Jason Hawkins, Jason Hawkins NO.:  [id number]   MEDICAL RECORD NO.:  [id number]                   PATIENT TYPE:  INP   LOCATION:  2006                                 FACILITY:  [facility]   PHYSICIAN:  Evelene Hawkins, M.D.                  DATE OF BIRTH:  May 03, 1928   DATE OF ADMISSION:  08/30/2003  DATE OF DISCHARGE:  09/04/2003                                 DISCHARGE SUMMARY   ADMISSION DIAGNOSIS:  Severe aortic stenosis.   SECONDARY DIAGNOSES:  1. Severe aortic stenosis status post aortic valve replacement using a     pericardial tissue valve.  2. Postoperative atrial fibrillation, now converted to normal sinus rhythm     with occasional premature atrial contractions.  Started on amiodarone on     August 31, 2003.  3. History of hypercholesterolemia.  4. History of left leg fracture sustained in a skiing accident in 1969.  5. History of hernia repair at age 59.   ALLERGIES:  CODEINE causes nausea and vomiting.   PROCEDURES:  1. August 30, 2003, the patient underwent a median sternotomy,     extracorporeal circulation, aortic valve replacement using a 25 mm     Edwards Magna pericardial tissue valve.  Surgeon was Evelene Hawkins, M.D.  2. August 30, 2003, the patient underwent a transesophageal     echocardiographic probe placement and monitoring during aortic valve     replacement.  Findings showed prosthetic aortic valve was working well.   BRIEF HISTORY:  Jason Hawkins is a 77 year old Caucasian male with a long  history of a heart murmur and aortic stenosis.  He had an electrocardiogram  done in August 2004 which showed severe aortic stenosis with a decrease in  his ejection fraction to 35 to 45%.  His ejection fraction in November 2003  had been normal.  He underwent cardiac catheterization on May 13, 2003,  which showed severe aortic stenosis with mild aortic insufficiency and  moderately decreased left ventricular systolic function with an  ejection  fraction of 37%.  There was no mitral regurgitation noted.  There was  insignificant coronary artery disease with a 70% ostial stenosis of the  first marginal branch.  There was moderate pulmonary hypertension.  Based on  these findings, aortic valve replacement was recommended, and he was  referred to Dr.  Evelene Hawkins by Jason Hawkins for this.  At that time,  however, his wife would also need mitral valve surgery, and he did decide to  delay his own surgery until his wife was feeling better.  He again saw Dr.  Laneta Hawkins on August 20, 2003.  By this time, he reported that he noticed a  decline in his exertional tolerance.  His wife also noted that he had become  pale and diaphoretic at times.  He did deny chest pain and shortness of  breath at rest.  He did  report dyspnea on exertion which affected his  ability to perform normal activities which included tennis.  By this time,  his wife had recovered from her mitral valve surgery.  Jason Hawkins reviewed  aortic valve replacement surgery including alternatives, risks, and  benefits.  The patient did prefer tissue valve over mechanical valve as he  did not wish to be on long-term Coumadin therapy.  This surgery was  tentatively scheduled for August 30, 2003.   HOSPITAL COURSE:  On August 30, 2003, Jason Hawkins was electively admitted  to [facility] and did undergo aortic valve replacement using a 25  mm Edwards Magna pericardial tissue valve.  He did tolerate this procedure  well and was transferred to the surgical intensive care unit in stable  condition.  Later that day, he did require 1 units of fresh frozen plasma  and 2 units of packed red blood cells for postoperative anemia but otherwise  remained stable.  He was extubated neurologically intact.  He also developed  frequent premature atrial contractions with bursts of atrial fibrillation  postoperatively and was started on amiodarone drip.   On the morning of  postoperative day #2, Mr, Hawkins was afebrile with  stable vital signs.  His heart was in sinus rhythm but continued to have  frequent premature contractions.  He was converted to amiodarone p.o.  His  hemoglobin remained stable at 8.3.  His chest x-ray was clear, and his EKG  showed no significant changes.  Based on this, Jason Hawkins did feel the  patient was stable for transfer out of the ICU onto 2000 telemetry unit.   Over the next several days, Mr.  Hawkins progressed well.  He remained  afebrile.  He did remain in sinus rhythm with frequent premature atrial  contractions and did require an increased dose of Lopressor.  After this  adjustment, his PACs were noted to decrease in frequently.  Hemoglobin and  hematocrit increased to 10.2 and 29.7, respectively.  His lungs remained  fair; however, he did initially require supplemental oxygen by nasal  cannula, but prior to discharge, he was saturating greater than 90% on room  air.  By discharge, his lower extremity edema had resolved; however, his  weight was still up approximately 10 pounds.  Based on this, he did require  diuretic therapy during hospitalization and would continue this briefly  after discharge.  His incisions remained clean, dry, and intact without  evidence of erythema.  He did develop a few blisters surrounding his chest  tube site which were thought secondary to tape sensitivity.  Jason Hawkins  was also mobilizing independently.   Based on Jason Hawkins steady progression, Jason Hawkins did feel the patient  would be ready for discharge home on Saturday, September 04, 2003.  A final  decision will be made pending morning rounds.   Recent labs:  On September 01, 2003, his PT was 13.6, INR 1.1.  White blood  cell count 13.2, hemoglobin 10.2, hematocrit 29.7, platelet count 93.  Sodium 134, potassium 4.1, blood glucose 144, BUN 27, creatinine 1.1.   DISCHARGE MEDICATIONS:  1. Toprol XL 50 mg 1 p.o. daily. 2. Zocor 10  mg 1 p.o. daily.  3. Aspirin 325 mg 1 p.o. daily.  4. Multivitamins 1 p.o. daily.  5. Amiodarone 400 mg p.o. b.i.d. x 3 days, then decrease to 200 mg p.o.     b.i.d.  6. Lasix 40 mg 1 p.o. daily x 10 days.  7. K-Dur 20  mEq 1 p.o. daily x 10 days.  8. Dilaudid 2 mg 1 to 2 tablets p.o. q.4h. p.r.n. pain.   ACTIVITY:  He is instructed to avoid driving, heavy lifting, or strenuous  activity.  Daily walking and breathing exercises were encouraged.   DIET:  He is to follow a heart healthy low-fat, low-salt diet.   WOUND CARE:  He may shower daily with soap and water.  He is to avoid  placing lotions directly on his incisions.  He is to report fever greater  than 101 or redness or drainage from his incision sites to the CVTS office.   FOLLOW UP:  1. He is to follow up with Jason Hawkins on Tuesday, October 12, 2003, at 12     p.m.  He is to bring his most recent chest x-ray to this appointment.  2. He is to follow up with Jason Hawkins on September 22, 2003, at 4:15     p.m.  He is to have a chest x-ray done at this appointment.  3. He is to follow up with his primary physician, Dr. Clydie Braun L. Hal Hope, as     needed.      Jerold Coombe, P.A.                  Evelene Hawkins, M.D.    AWZ/MEDQ  D:  09/03/2003  T:  09/04/2003  Job:  [id number]   cc:   Charlton Hawkins, M.D.   Marcos Eke. Hal Hope, M.D.  [address] [place]  [place] [address]  Fax: [phone]

## 2010-12-22 NOTE — Op Note (Signed)
NAME:  Jason Hawkins, Jason Hawkins NO.:  [id number]   MEDICAL RECORD NO.:  [id number]                   PATIENT TYPE:  INP   LOCATION:  2309                                 FACILITY:  [facility]   PHYSICIAN:  Evelene Croon, M.D.                  DATE OF BIRTH:  Sep 30, 1927   DATE OF PROCEDURE:  08/30/2003  DATE OF DISCHARGE:                                 OPERATIVE REPORT   PREOPERATIVE DIAGNOSES:  Severe aortic stenosis.   POSTOPERATIVE DIAGNOSES:  Severe aortic stenosis.   OPERATION PERFORMED:  Median sternotomy, extracorporeal circulation, aortic  valve replacement using a 25 mm Edwards Magna pericardial valve.   SURGEON:  Alleen Borne, M.D.   ASSISTANT:  Salvatore Decent. Cornelius Moras, M.D.   ANESTHESIA:  General endotracheal.   INDICATIONS FOR PROCEDURE:  The patient is a 77 year old gentleman with  along history of a heart murmur and aortic stenosis.  He had an  electrocardiogram done in August of 2004 which showed severe aortic stenosis  with a decline in ejection fraction to 35 to 45%.  His ejection fraction had  been normal in November of 2003.  There was mild aortic insufficiency and  mild mitral regurgitation.  His peak valve gradient at that time was 90.5  with a mean gradient of 47.  He underwent cardiac catheterization on May 13, 2003 which showed severe aortic stenosis with mild aortic insufficiency  and moderately decreased left ventricular systolic function with an ejection  fraction of 37%.  There was no mitral regurgitation.  There was  insignificant coronary disease with a 70% ostial stenosis of the small first  marginal branch.  There was moderate pulmonary hypertension.  The aortic  valve replacement was recommended at this time and the patient wanted to put  off the surgery because his wife had to undergo mitral valve surgery.  At  this point he has had some fatigue and has been unable to do his normal  activities.  He denies any chest pain and has  had no syncope.  I discussed  aortic valve replacement surgery with him and his wife including  alternatives, benefits and risks.  We discussed a mechanical valve versus a  tissue valve and he said he did not want to be on Coumadin long term if he  could avoid it and would rather have a tissue valve.  I felt this would be a  reasonable choice at his age of 56 years.  I discussed the risks of surgery  including bleeding, blood transfusion, infection, stroke, myocardial  infarction, valve failure and death. They understood and agreed to proceed.   DESCRIPTION OF PROCEDURE:  The patient was taken to the operating room and  placed on the table in supine position.  After induction of general  endotracheal anesthesia, a Foley catheter was placed in the bladder using  sterile technique.  Then the chest, abdomen and  both lower extremities were  prepped and draped in the usual sterile manner.  The chest was entered  through a median sternotomy incision and the pericardium opened in the  midline.  Examination of the heart showed good ventricular contractility.  The ascending aorta was relatively long and had no palpable plaques in it.   Transesophageal echocardiogram was performed and showed severe calcific  aortic stenosis with mild aortic insufficiency.  The left ventricle was  somewhat dilated and globally hypokinetic.  There was no significant  pulmonary hypertension.   Then the patient was heparinized and when an adequate activated clotting  time was achieved, the distal ascending aorta was cannulated using a 20  French aortic cannula for arterial inflow.  Venous outflow was achieved  using a two-stage venous cannula for the right atrial appendage.  An  antegrade cardioplegia and vent cannula was inserted in the aortic root.  A  left ventricular vent was placed through the right superior pulmonary vein  and the retrograde cardioplegia cannula through the right atrium and into  the coronary  sinus.   The patient was placed on cardiopulmonary bypass.  The distal coronary  arteries were identified.  There was no significant plaque seen in the  coronary arteries.  Then the aorta was crossclamped and 500 mL of cold blood  antegrade cardioplegia was administered in the aortic root with quick arrest  of the heart.  This was followed by 300 mL of cold blood retrograde  cardioplegia.  Additional doses of retrograde cardioplegia were given at 20  minute intervals to maintain myocardial temperature around 10 degrees  centigrade.  Systemic hypothermia to 32 degrees centigrade and topical  hypothermia with iced saline was used.  A temperature probe was placed on  the septum and insulating pad in the pericardium.   Then the aorta was opened transversely about 1 cm above the take off of the  right coronary artery.  The aortic wall was relatively thin.  There was mild  poststenotic dilatation of the aortic root.  Examination of the native valve  showed that it was a 3 leaflet valve with marked calcification and fusion of  the commissures.  All three valve leaflets were completely calcified and  poorly mobile.  Then the native valve was excised.  The annulus was  decalcified using rongeurs.  Care was taken to remove all particulate  debris.  The aortic root and left ventricle were irrigated with iced saline  solution.  The annulus was sized and a 25 mm Edwards Magna pericardial valve  was chosen.  Then a series of pledgeted 2-0 Ethibond horizontal mattress  sutures were placed around the annulus with the pledgets in the subannular  position.  The sutures were then placed through the sewing ring and the  valve lowered into place.  The sutures were tied sequentially.  The valve  seated nicely.  The patient was rewarmed to 37 degrees centigrade.  The  aorta was closed in two layers using continuous 3-0 Prolene suture with felt strips to reinforce the closure.  The left side of the heart was  deaired.  The head was placed in Trendelenburg position.  The crossclamp was removed  with time of 70 minutes.  There was spontaneous return of sinus rhythm.   After further deairing maneuvers, the patient was weaned from  cardiopulmonary bypass on low-dose dopamine.  Total bypass time was 113  minutes.  Transesophageal echocardiogram showed a normal functioning  prosthetic aortic valve with no evidence of  perivalvular leak or  regurgitation.  There was no significant mitral regurgitation.  Left  ventricular function appeared well preserved.  Protamine was given and the  venous and aortic cannulas were removed without difficulty.  Hemostasis was  achieved.  Two chest tubes were placed with a tube in the posterior  pericardium and one in the anterior mediastinum.  The pericardium was  reapproximated over the heart.  The sternum was closed with #6 stainless  steel wires.  The fascia was closed with continuous #1 Vicryl suture.  The  subcutaneous tissue was closed with continuous 2-0 Vicryl and the skin with  3-0 Vicryl subcuticular closure.  Sponge, needle and instrument counts were  correct according to the scrub nurse.  Dry sterile dressings were applied  over the incisions, around the chest tubes which were hooked to Pleur-evac  suction.  The patient remained hemodynamically stable and was transported to  the SICU in guarded but stable condition.                                               Evelene Croon, M.D.    BB/MEDQ  D:  08/30/2003  T:  08/30/2003  Job:  [id number]   cc:   [name], M.D.

## 2010-12-22 NOTE — Op Note (Signed)
NAME:  CHANTRY, HEADEN NO.:  [id number]   MEDICAL RECORD NO.:  [id number]                   PATIENT TYPE:  INP   LOCATION:  2309                                 FACILITY:  [facility]   PHYSICIAN:  Kaylyn Layer. Michelle Piper, M.D.                DATE OF BIRTH:  [birth year]   DATE OF PROCEDURE:  08/30/2003  DATE OF DISCHARGE:                                 OPERATIVE REPORT   PROCEDURE:  Transesophageal echocardiographic probe placement and monitoring  during aortic valve replacement.   BODY OF REPORT:  I placed the transesophageal echo probe into the patient  for interoperative evaluation and monitoring during aortic valve  replacement.  The patient is a 77 year old gentleman with tight aortic  stenosis who presented for aortic valve replacement.   After uneventful induction of anesthesia and endotracheal intubation, the  transesophageal probe was easily passed into the stomach.  Initial shot via  the left ventricle showed moderate left ventricular  hypertrophy without any  motion abnormalities.  Turning to the four chamber view, specifically the  mitral valve, the structure was normal.  The edges of the leaflet coapted  and on placing color flow Doppler across the valve, there was no evidence of  mitral regurgitation.  The left atrium was normal.  The left atrial  appendage was normal.  The left ventricular outflow tract showed 1-2+ aortic  insufficiency.  The aortic valve was heavily calcified, it was tricuspid and  in the longitudinal view, we could clearly see the supravalvular jet of  aortic stenosis with 1-2+ aortic insufficiency.  Intra-atrial septum was  normal.  The right side of the heart was normal.   The patient was placed on cardiopulmonary bypass by Dr. Laneta Simmers and underwent  an aortic valve replacement.  His initial views just prior to discontinuing  cardiopulmonary bypass showed minimal intracardiac air, left ventricle was  unchanged with no wall motion  abnormalities.  The mitral valve apparatus and  left atrium were normal.  The aortic valve area showed a prosthetic aortic  valve working well.  The left ventricular  outflow did not show any evidence  of aortic insufficiency and on longitudinal views, the valve could be seen  working well and supravalvular aortic stenosis was not present.  The patient  was discontinued from cardiopulmonary bypass uneventfully and at the end of  the procedure, the transesophageal echocardiogram probe was removed and the  patient was taken to the intensive care unit in stable condition.                                               Kaylyn Layer Michelle Piper, M.D.    KDO/MEDQ  D:  08/30/2003  T:  08/30/2003  Job:  [id number]   cc:   Evelene Croon, M.D.  [address]  [place]  [place] [address]

## 2011-01-11 ENCOUNTER — Ambulatory Visit (INDEPENDENT_AMBULATORY_CARE_PROVIDER_SITE_OTHER): Payer: Medicare Other | Admitting: *Deleted

## 2011-01-11 DIAGNOSIS — I4891 Unspecified atrial fibrillation: Secondary | ICD-10-CM

## 2011-01-11 DIAGNOSIS — Z7901 Long term (current) use of anticoagulants: Secondary | ICD-10-CM

## 2011-01-11 DIAGNOSIS — Z954 Presence of other heart-valve replacement: Secondary | ICD-10-CM

## 2011-02-08 ENCOUNTER — Ambulatory Visit (INDEPENDENT_AMBULATORY_CARE_PROVIDER_SITE_OTHER): Payer: Medicare Other | Admitting: *Deleted

## 2011-02-08 DIAGNOSIS — Z954 Presence of other heart-valve replacement: Secondary | ICD-10-CM

## 2011-02-08 DIAGNOSIS — I4891 Unspecified atrial fibrillation: Secondary | ICD-10-CM

## 2011-02-08 DIAGNOSIS — Z7901 Long term (current) use of anticoagulants: Secondary | ICD-10-CM

## 2011-02-08 LAB — POCT INR: INR: 2.7

## 2011-03-12 ENCOUNTER — Ambulatory Visit (INDEPENDENT_AMBULATORY_CARE_PROVIDER_SITE_OTHER): Payer: Medicare Other | Admitting: *Deleted

## 2011-03-12 DIAGNOSIS — Z7901 Long term (current) use of anticoagulants: Secondary | ICD-10-CM

## 2011-03-12 DIAGNOSIS — I4891 Unspecified atrial fibrillation: Secondary | ICD-10-CM

## 2011-03-12 DIAGNOSIS — Z954 Presence of other heart-valve replacement: Secondary | ICD-10-CM

## 2011-03-21 ENCOUNTER — Encounter: Payer: Self-pay | Admitting: Cardiovascular Disease

## 2011-03-29 ENCOUNTER — Ambulatory Visit: Payer: Medicare Other | Admitting: Cardiovascular Disease

## 2011-04-13 ENCOUNTER — Ambulatory Visit (INDEPENDENT_AMBULATORY_CARE_PROVIDER_SITE_OTHER): Payer: Medicare Other | Admitting: Cardiovascular Disease

## 2011-04-13 ENCOUNTER — Encounter: Payer: Self-pay | Admitting: Cardiovascular Disease

## 2011-04-13 ENCOUNTER — Ambulatory Visit (INDEPENDENT_AMBULATORY_CARE_PROVIDER_SITE_OTHER): Payer: Medicare Other | Admitting: *Deleted

## 2011-04-13 DIAGNOSIS — I359 Nonrheumatic aortic valve disorder, unspecified: Secondary | ICD-10-CM

## 2011-04-13 DIAGNOSIS — I4891 Unspecified atrial fibrillation: Secondary | ICD-10-CM

## 2011-04-13 DIAGNOSIS — Z954 Presence of other heart-valve replacement: Secondary | ICD-10-CM

## 2011-04-13 DIAGNOSIS — F329 Major depressive disorder, single episode, unspecified: Secondary | ICD-10-CM

## 2011-04-13 DIAGNOSIS — F32A Depression, unspecified: Secondary | ICD-10-CM | POA: Insufficient documentation

## 2011-04-13 DIAGNOSIS — Z7901 Long term (current) use of anticoagulants: Secondary | ICD-10-CM

## 2011-04-13 MED ORDER — WARFARIN SODIUM 5 MG PO TABS: 5.0000 mg | ORAL_TABLET | Freq: Every day | ORAL | Status: DC

## 2011-04-13 MED ORDER — METOPROLOL SUCCINATE ER 50 MG PO TB24: 50.0000 mg | ORAL_TABLET | Freq: Every day | ORAL | Status: DC

## 2011-04-13 NOTE — Assessment & Plan Note (Signed)
Normal exam with no TIA;s  SBE prophylaxis

## 2011-04-13 NOTE — Assessment & Plan Note (Signed)
Good rate control  Check INR in clinic today.  No bleeding problems

## 2011-04-13 NOTE — Progress Notes (Signed)
Jason Hawkins is seen today for F/U  We spent most of the visit grieving about his wife;s Jason Hawkins's death last week.  They were married for over 60 years and she was a patient of ours as well.  Jason Hawkins has a history of   AVR and coumadin and afib. He has some dementia. He has not had any TIA's or bleeding complications although he forgets his coumadin from time to time. He denies palpitations, SSCP, dyspnea or syncope. He broke his left tibia in his 52" and has mild chronic LE edema.  Reviewed recentl lab work which was good. Both he and his wife are now on Vit D  ROS: Denies fever, malais, weight loss, blurry vision, decreased visual acuity, cough, sputum, SOB, hemoptysis, pleuritic pain, palpitaitons, heartburn, abdominal pain, melena, lower extremity edema, claudication, or rash.  All other systems reviewed and negative  General: Affect appropriate Healthy:  appears stated age HEENT: normal Neck supple with no adenopathy JVP normal no bruits no thyromegaly Lungs clear with no wheezing and good diaphragmatic motion Heart:  S1/S2 click systolic  murmur,rub, gallop or click PMI normal Abdomen: benighn, BS positve, no tenderness, no AAA no bruit.  No HSM or HJR Distal pulses intact with no bruits No edema Neuro non-focal Skin warm and dry No muscular weakness   Current Outpatient Prescriptions  Medication Sig Dispense Refill  . GLUCOSAMINE-CHONDROITIN PO Take 1 tablet by mouth daily.        [name] Hyaluronic Acid 20-60 MG CAPS Take 1 tablet by mouth daily.        . metoprolol (TOPROL-XL) 50 MG 24 hr tablet Take 50 mg by mouth daily.        . Multiple Vitamin (MULTIVITAMIN) capsule Take 1 capsule by mouth daily.        . Red Yeast Rice 600 MG CAPS Take 1 capsule by mouth daily.        . TURMERIC PO Take 1 tablet by mouth daily.        [name] warfarin (COUMADIN) 5 MG tablet Take as directed by the coumadin clinic         Allergies  Codeine  Electrocardiogram:  Atrial fibrillation rate 66 otherwise  normal  Assessment and Plan

## 2011-04-13 NOTE — Patient Instructions (Addendum)
Your physician wants you to follow-up in: June 2013 You will receive a reminder letter in the mail two months in advance. If you don't receive a letter, please call our office to schedule the follow-up appointment.  

## 2011-04-13 NOTE — Assessment & Plan Note (Signed)
Offerred antidepressant or xanax and he does not want any.  Has children living next door.  Offerred our sincere condolences

## 2011-04-16 ENCOUNTER — Encounter: Payer: Medicare Other | Admitting: *Deleted

## 2011-05-11 ENCOUNTER — Ambulatory Visit (INDEPENDENT_AMBULATORY_CARE_PROVIDER_SITE_OTHER): Payer: Medicare Other | Admitting: *Deleted

## 2011-05-11 DIAGNOSIS — Z954 Presence of other heart-valve replacement: Secondary | ICD-10-CM

## 2011-05-11 DIAGNOSIS — I4891 Unspecified atrial fibrillation: Secondary | ICD-10-CM

## 2011-05-11 DIAGNOSIS — Z7901 Long term (current) use of anticoagulants: Secondary | ICD-10-CM

## 2011-06-08 ENCOUNTER — Ambulatory Visit (INDEPENDENT_AMBULATORY_CARE_PROVIDER_SITE_OTHER): Payer: Medicare Other | Admitting: *Deleted

## 2011-06-08 DIAGNOSIS — Z7901 Long term (current) use of anticoagulants: Secondary | ICD-10-CM

## 2011-06-08 DIAGNOSIS — I4891 Unspecified atrial fibrillation: Secondary | ICD-10-CM

## 2011-06-08 DIAGNOSIS — Z954 Presence of other heart-valve replacement: Secondary | ICD-10-CM

## 2011-07-10 ENCOUNTER — Ambulatory Visit: Payer: Medicare Other | Admitting: Cardiovascular Disease

## 2011-10-03 DIAGNOSIS — Z7901 Long term (current) use of anticoagulants: Secondary | ICD-10-CM | POA: Diagnosis not present

## 2011-10-03 DIAGNOSIS — I4891 Unspecified atrial fibrillation: Secondary | ICD-10-CM | POA: Diagnosis not present

## 2011-11-01 DIAGNOSIS — I4891 Unspecified atrial fibrillation: Secondary | ICD-10-CM | POA: Diagnosis not present

## 2011-11-01 DIAGNOSIS — Z7901 Long term (current) use of anticoagulants: Secondary | ICD-10-CM | POA: Diagnosis not present

## 2011-12-12 ENCOUNTER — Ambulatory Visit (INDEPENDENT_AMBULATORY_CARE_PROVIDER_SITE_OTHER): Payer: Medicare Other | Admitting: Pharmacist

## 2011-12-12 DIAGNOSIS — I4891 Unspecified atrial fibrillation: Secondary | ICD-10-CM

## 2011-12-12 DIAGNOSIS — Z954 Presence of other heart-valve replacement: Secondary | ICD-10-CM

## 2011-12-12 DIAGNOSIS — Z7901 Long term (current) use of anticoagulants: Secondary | ICD-10-CM

## 2011-12-12 LAB — POCT INR: INR: 2.6

## 2012-01-18 ENCOUNTER — Ambulatory Visit (INDEPENDENT_AMBULATORY_CARE_PROVIDER_SITE_OTHER): Payer: Medicare Other | Admitting: Cardiovascular Disease

## 2012-01-18 ENCOUNTER — Encounter: Payer: Self-pay | Admitting: Cardiovascular Disease

## 2012-01-18 VITALS — BP 154/80 | HR 68 | Wt 182.0 lb

## 2012-01-18 DIAGNOSIS — I4891 Unspecified atrial fibrillation: Secondary | ICD-10-CM | POA: Diagnosis not present

## 2012-01-18 DIAGNOSIS — F329 Major depressive disorder, single episode, unspecified: Secondary | ICD-10-CM | POA: Diagnosis not present

## 2012-01-18 DIAGNOSIS — Z954 Presence of other heart-valve replacement: Secondary | ICD-10-CM | POA: Diagnosis not present

## 2012-01-18 NOTE — Assessment & Plan Note (Signed)
Normal exam with no AR  SBE prophylaxis

## 2012-01-18 NOTE — Patient Instructions (Signed)
Your physician wants you to follow-up in:  6 MONTHS WITH DR NISHAN  You will receive a reminder letter in the mail two months in advance. If you don't receive a letter, please call our office to schedule the follow-up appointment. Your physician recommends that you continue on your current medications as directed. Please refer to the Current Medication list given to you today. 

## 2012-01-18 NOTE — Assessment & Plan Note (Signed)
Profound  Declines Rx  F/U primary

## 2012-01-18 NOTE — Assessment & Plan Note (Signed)
Good rate control and anticoagulation  

## 2012-01-18 NOTE — Progress Notes (Signed)
Patient ID: Jason Hawkins, male   DOB: 21-Oct-1927, 78 y.o.   MRN: [id number] Jason Hawkins is seen today for F/U We spent most of the visit grieving about his wife;s Jason Hawkins death. They were married for over 60 years and she was a patient of ours as well. Jason Hawkins has a history of AVR and coumadin and afib. He has some dementia. He has not had any TIA's or bleeding complications although he forgets his coumadin from time to time. He denies palpitations, SSCP, dyspnea or syncope. He broke his left tibia in his 28" and has mild chronic LE edema.   He is profoundly depressed.  Has not cut his hair since she died as she use to do this for him  Going to [place] soon Declines any Rx for depression  ROS: Denies fever, malais, weight loss, blurry vision, decreased visual acuity, cough, sputum, SOB, hemoptysis, pleuritic pain, palpitaitons, heartburn, abdominal pain, melena, lower extremity edema, claudication, or rash.  All other systems reviewed and negative  General: Affect appropriate Healthy:  appears stated age HEENT: normal Neck supple with no adenopathy JVP normal no bruits no thyromegaly Lungs clear with no wheezing and good diaphragmatic motion Heart:  S1/S2 no murmur, no rub, gallop or click PMI normal Abdomen: benighn, BS positve, no tenderness, no AAA no bruit.  No HSM or HJR Distal pulses intact with no bruits No edema Neuro non-focal Skin warm and dry No muscular weakness   Current Outpatient Prescriptions  Medication Sig Dispense Refill  . GLUCOSAMINE-CHONDROITIN PO Take 1 tablet by mouth daily.        [name] Hyaluronic Acid 20-60 MG CAPS Take 1 tablet by mouth daily.        . metoprolol (TOPROL-XL) 50 MG 24 hr tablet Take 1 tablet (50 mg total) by mouth daily.  90 tablet  4  . Multiple Vitamin (MULTIVITAMIN) capsule Take 1 capsule by mouth daily.        . Red Yeast Rice 600 MG CAPS Take 1 capsule by mouth daily.        . TURMERIC PO Take 1 tablet by mouth daily.        [name] warfarin (COUMADIN)  5 MG tablet Take 1 tablet (5 mg total) by mouth daily. Take as directed by the coumadin clinic  90 tablet  4    Allergies  Codeine  Electrocardiogram: 04/13/11  AFIB RATE 66 NO ACUTE CHANGES  Assessment and Plan

## 2012-01-23 ENCOUNTER — Ambulatory Visit (INDEPENDENT_AMBULATORY_CARE_PROVIDER_SITE_OTHER): Payer: Medicare Other | Admitting: *Deleted

## 2012-01-23 DIAGNOSIS — Z7901 Long term (current) use of anticoagulants: Secondary | ICD-10-CM

## 2012-01-23 DIAGNOSIS — Z954 Presence of other heart-valve replacement: Secondary | ICD-10-CM

## 2012-01-23 DIAGNOSIS — I4891 Unspecified atrial fibrillation: Secondary | ICD-10-CM

## 2012-01-23 LAB — POCT INR: INR: 3.1

## 2012-03-04 ENCOUNTER — Ambulatory Visit (INDEPENDENT_AMBULATORY_CARE_PROVIDER_SITE_OTHER): Payer: Medicare Other | Admitting: Pharmacist

## 2012-03-04 DIAGNOSIS — Z954 Presence of other heart-valve replacement: Secondary | ICD-10-CM

## 2012-03-04 DIAGNOSIS — I4891 Unspecified atrial fibrillation: Secondary | ICD-10-CM | POA: Diagnosis not present

## 2012-03-04 DIAGNOSIS — Z7901 Long term (current) use of anticoagulants: Secondary | ICD-10-CM

## 2012-03-19 DIAGNOSIS — H25099 Other age-related incipient cataract, unspecified eye: Secondary | ICD-10-CM | POA: Diagnosis not present

## 2012-04-16 ENCOUNTER — Ambulatory Visit (INDEPENDENT_AMBULATORY_CARE_PROVIDER_SITE_OTHER): Payer: Medicare Other | Admitting: *Deleted

## 2012-04-16 DIAGNOSIS — Z7901 Long term (current) use of anticoagulants: Secondary | ICD-10-CM

## 2012-04-16 DIAGNOSIS — Z954 Presence of other heart-valve replacement: Secondary | ICD-10-CM

## 2012-04-16 DIAGNOSIS — I4891 Unspecified atrial fibrillation: Secondary | ICD-10-CM

## 2012-04-16 LAB — POCT INR: INR: 2.8

## 2012-04-21 ENCOUNTER — Other Ambulatory Visit: Payer: Self-pay | Admitting: Cardiovascular Disease

## 2012-04-21 MED ORDER — WARFARIN SODIUM 5 MG PO TABS: 5.0000 mg | ORAL_TABLET | ORAL | Status: DC

## 2012-04-21 MED ORDER — METOPROLOL SUCCINATE ER 50 MG PO TB24: 50.0000 mg | ORAL_TABLET | Freq: Every day | ORAL | Status: DC

## 2012-05-28 ENCOUNTER — Ambulatory Visit (INDEPENDENT_AMBULATORY_CARE_PROVIDER_SITE_OTHER): Payer: Medicare Other | Admitting: *Deleted

## 2012-05-28 DIAGNOSIS — H903 Sensorineural hearing loss, bilateral: Secondary | ICD-10-CM | POA: Diagnosis not present

## 2012-05-28 DIAGNOSIS — Z954 Presence of other heart-valve replacement: Secondary | ICD-10-CM | POA: Diagnosis not present

## 2012-05-28 DIAGNOSIS — Z7901 Long term (current) use of anticoagulants: Secondary | ICD-10-CM | POA: Diagnosis not present

## 2012-05-28 DIAGNOSIS — I4891 Unspecified atrial fibrillation: Secondary | ICD-10-CM | POA: Diagnosis not present

## 2012-05-28 LAB — POCT INR: INR: 3.2

## 2012-07-25 DIAGNOSIS — Z7901 Long term (current) use of anticoagulants: Secondary | ICD-10-CM | POA: Diagnosis not present

## 2012-08-07 DIAGNOSIS — E785 Hyperlipidemia, unspecified: Secondary | ICD-10-CM | POA: Diagnosis not present

## 2012-08-07 DIAGNOSIS — I10 Essential (primary) hypertension: Secondary | ICD-10-CM | POA: Diagnosis not present

## 2012-08-07 DIAGNOSIS — I4891 Unspecified atrial fibrillation: Secondary | ICD-10-CM | POA: Diagnosis not present

## 2012-08-07 DIAGNOSIS — C61 Malignant neoplasm of prostate: Secondary | ICD-10-CM | POA: Diagnosis not present

## 2012-09-04 DIAGNOSIS — E785 Hyperlipidemia, unspecified: Secondary | ICD-10-CM | POA: Diagnosis not present

## 2012-09-04 DIAGNOSIS — I4891 Unspecified atrial fibrillation: Secondary | ICD-10-CM | POA: Diagnosis not present

## 2012-09-04 DIAGNOSIS — I10 Essential (primary) hypertension: Secondary | ICD-10-CM | POA: Diagnosis not present

## 2012-09-04 DIAGNOSIS — M159 Polyosteoarthritis, unspecified: Secondary | ICD-10-CM | POA: Diagnosis not present

## 2012-09-04 DIAGNOSIS — Z7901 Long term (current) use of anticoagulants: Secondary | ICD-10-CM | POA: Diagnosis not present

## 2012-09-16 DIAGNOSIS — I10 Essential (primary) hypertension: Secondary | ICD-10-CM | POA: Diagnosis not present

## 2012-09-16 DIAGNOSIS — C61 Malignant neoplasm of prostate: Secondary | ICD-10-CM | POA: Diagnosis not present

## 2012-10-08 DIAGNOSIS — I4891 Unspecified atrial fibrillation: Secondary | ICD-10-CM | POA: Diagnosis not present

## 2012-10-15 DIAGNOSIS — I4891 Unspecified atrial fibrillation: Secondary | ICD-10-CM | POA: Diagnosis not present

## 2012-10-15 DIAGNOSIS — I10 Essential (primary) hypertension: Secondary | ICD-10-CM | POA: Diagnosis not present

## 2012-11-10 DIAGNOSIS — I4891 Unspecified atrial fibrillation: Secondary | ICD-10-CM | POA: Diagnosis not present

## 2012-11-10 DIAGNOSIS — N529 Male erectile dysfunction, unspecified: Secondary | ICD-10-CM | POA: Diagnosis not present

## 2012-11-10 DIAGNOSIS — Z7901 Long term (current) use of anticoagulants: Secondary | ICD-10-CM | POA: Diagnosis not present

## 2012-11-10 DIAGNOSIS — I10 Essential (primary) hypertension: Secondary | ICD-10-CM | POA: Diagnosis not present

## 2012-12-03 DIAGNOSIS — B029 Zoster without complications: Secondary | ICD-10-CM | POA: Diagnosis not present

## 2012-12-12 DIAGNOSIS — B029 Zoster without complications: Secondary | ICD-10-CM | POA: Diagnosis not present

## 2012-12-23 DIAGNOSIS — L821 Other seborrheic keratosis: Secondary | ICD-10-CM | POA: Diagnosis not present

## 2012-12-23 DIAGNOSIS — L570 Actinic keratosis: Secondary | ICD-10-CM | POA: Diagnosis not present

## 2012-12-23 DIAGNOSIS — B029 Zoster without complications: Secondary | ICD-10-CM | POA: Diagnosis not present

## 2012-12-25 ENCOUNTER — Encounter: Payer: Self-pay | Admitting: *Deleted

## 2012-12-25 ENCOUNTER — Telehealth: Payer: Self-pay | Admitting: Cardiovascular Disease

## 2012-12-25 NOTE — Telephone Encounter (Signed)
  This encounter was created in error - please disregard. Pt doesn't want me to do refill to mail order he states its been messed up since 12/11/12 and wants it ironed out tomorrow when he sees Dr Eden Emms. I informed him that we are responsible for refills in  coumadin clinic and he still wants to wait to discuss with Dr Eden Emms tomorrow.

## 2012-12-25 NOTE — Telephone Encounter (Signed)
New Prblem:    Patient called in because he did not receive a refill of his warfarin (COUMADIN) 5 MG tablet.  Patient is under belief that [facility] is his home delivery pharmacy.  Please call back if you have any questions.

## 2012-12-26 ENCOUNTER — Encounter: Payer: Self-pay | Admitting: Cardiovascular Disease

## 2012-12-26 ENCOUNTER — Ambulatory Visit (INDEPENDENT_AMBULATORY_CARE_PROVIDER_SITE_OTHER): Payer: Medicare Other | Admitting: Cardiovascular Disease

## 2012-12-26 ENCOUNTER — Ambulatory Visit (INDEPENDENT_AMBULATORY_CARE_PROVIDER_SITE_OTHER): Payer: Medicare Other | Admitting: *Deleted

## 2012-12-26 ENCOUNTER — Other Ambulatory Visit: Payer: Self-pay | Admitting: *Deleted

## 2012-12-26 VITALS — BP 132/74 | HR 71 | Ht 69.5 in | Wt 183.0 lb

## 2012-12-26 DIAGNOSIS — I4891 Unspecified atrial fibrillation: Secondary | ICD-10-CM | POA: Diagnosis not present

## 2012-12-26 DIAGNOSIS — R609 Edema, unspecified: Secondary | ICD-10-CM

## 2012-12-26 DIAGNOSIS — Z7901 Long term (current) use of anticoagulants: Secondary | ICD-10-CM | POA: Diagnosis not present

## 2012-12-26 DIAGNOSIS — Z954 Presence of other heart-valve replacement: Secondary | ICD-10-CM | POA: Diagnosis not present

## 2012-12-26 LAB — POCT INR: INR: 2.1

## 2012-12-26 MED ORDER — WARFARIN SODIUM 5 MG PO TABS: 5.0000 mg | ORAL_TABLET | ORAL | Status: DC

## 2012-12-26 NOTE — Progress Notes (Signed)
Patient ID: Jason Hawkins, male   DOB: 10/12/1927, 79 y.o.   MRN: [id number] Jason Hawkins is seen today for F/U We spent most of the visit grieving about his wife;s Jason Hawkins's death last week. They were married for over 60 years and she was a patient of ours as well. [name] has a history of AVR and coumadin and afib. He has some dementia. He has not had any TIA's or bleeding complications although he forgets his coumadin from time to time. He denies palpitations, SSCP, dyspnea or syncope. He broke his left tibia in his 86" and has mild chronic LE edema.   He has let his hair grow long [name] use to cut it and he will not cut it again  Needs coumadin refill  ROS: Denies fever, malais, weight loss, blurry vision, decreased visual acuity, cough, sputum, SOB, hemoptysis, pleuritic pain, palpitaitons, heartburn, abdominal pain, melena, lower extremity edema, claudication, or rash.  All other systems reviewed and negative  General: Affect appropriate Healthy:  appears stated age HEENT: normal Neck supple with no adenopathy JVP normal no bruits no thyromegaly Lungs clear with no wheezing and good diaphragmatic motion Heart:  S1/S2 click SRM  No AR  murmur, no rub, gallop or click PMI normal Abdomen: benighn, BS positve, no tenderness, no AAA no bruit.  No HSM or HJR Distal pulses intact with no bruits Plus 2 bilateral  edema Neuro non-focal Skin warm and dry No muscular weakness   Current Outpatient Prescriptions  Medication Sig Dispense Refill  . GLUCOSAMINE-CHONDROITIN PO Take 1 tablet by mouth daily.        [name] Hyaluronic Acid 20-60 MG CAPS Take 1 tablet by mouth daily.        . metoprolol succinate (TOPROL-XL) 50 MG 24 hr tablet Take 1 tablet (50 mg total) by mouth daily.  90 tablet  3  . Multiple Vitamin (MULTIVITAMIN) capsule Take 1 capsule by mouth daily.        . Red Yeast Rice 600 MG CAPS Take 1 capsule by mouth daily.        . TURMERIC PO Take 1 tablet by mouth daily.        [name] warfarin  (COUMADIN) 5 MG tablet Take 1 tablet (5 mg total) by mouth as directed.  90 tablet  1   No current facility-administered medications for this visit.    Allergies  Codeine  Electrocardiogram:  Afib rate 71 otherwise normal  Assessment and Plan

## 2012-12-26 NOTE — Assessment & Plan Note (Signed)
Good rate control and anticoagulation  

## 2012-12-26 NOTE — Assessment & Plan Note (Signed)
Normal sounds No need for echo

## 2012-12-26 NOTE — Assessment & Plan Note (Signed)
He has PRN diuretic at home but doesn't like to take them

## 2012-12-26 NOTE — Patient Instructions (Addendum)
Your physician wants you to follow-up in:  6 MONTHS WITH DR NISHAN  You will receive a reminder letter in the mail two months in advance. If you don't receive a letter, please call our office to schedule the follow-up appointment. Your physician recommends that you continue on your current medications as directed. Please refer to the Current Medication list given to you today. 

## 2012-12-30 DIAGNOSIS — R110 Nausea: Secondary | ICD-10-CM | POA: Diagnosis not present

## 2012-12-30 DIAGNOSIS — B0223 Postherpetic polyneuropathy: Secondary | ICD-10-CM | POA: Diagnosis not present

## 2012-12-30 DIAGNOSIS — B0229 Other postherpetic nervous system involvement: Secondary | ICD-10-CM | POA: Diagnosis not present

## 2013-01-23 ENCOUNTER — Ambulatory Visit (INDEPENDENT_AMBULATORY_CARE_PROVIDER_SITE_OTHER): Payer: Medicare Other

## 2013-01-23 DIAGNOSIS — I4891 Unspecified atrial fibrillation: Secondary | ICD-10-CM | POA: Diagnosis not present

## 2013-01-23 DIAGNOSIS — Z954 Presence of other heart-valve replacement: Secondary | ICD-10-CM | POA: Diagnosis not present

## 2013-01-23 DIAGNOSIS — Z7901 Long term (current) use of anticoagulants: Secondary | ICD-10-CM | POA: Diagnosis not present

## 2013-02-27 ENCOUNTER — Ambulatory Visit (INDEPENDENT_AMBULATORY_CARE_PROVIDER_SITE_OTHER): Payer: Medicare Other | Admitting: *Deleted

## 2013-02-27 DIAGNOSIS — Z954 Presence of other heart-valve replacement: Secondary | ICD-10-CM | POA: Diagnosis not present

## 2013-02-27 DIAGNOSIS — Z7901 Long term (current) use of anticoagulants: Secondary | ICD-10-CM | POA: Diagnosis not present

## 2013-02-27 DIAGNOSIS — I4891 Unspecified atrial fibrillation: Secondary | ICD-10-CM | POA: Diagnosis not present

## 2013-02-27 LAB — POCT INR: INR: 2.4

## 2013-03-03 ENCOUNTER — Telehealth: Payer: Self-pay | Admitting: Internal Medicine

## 2013-03-03 NOTE — Telephone Encounter (Signed)
Ok Thx 

## 2013-03-03 NOTE — Telephone Encounter (Signed)
Pt called stated his friend [name] ask Dr. Macario Golds if he could take on his friend (Jason Hawkins) to be his new pt. Please advise.

## 2013-03-04 NOTE — Telephone Encounter (Signed)
Pt has an appt 03/17/13 with Dr. Macario Golds.

## 2013-03-17 ENCOUNTER — Encounter: Payer: Self-pay | Admitting: Internal Medicine

## 2013-03-17 ENCOUNTER — Ambulatory Visit (INDEPENDENT_AMBULATORY_CARE_PROVIDER_SITE_OTHER): Payer: Medicare Other | Admitting: Internal Medicine

## 2013-03-17 VITALS — BP 130/68 | HR 68 | Temp 98.7°F | Resp 16 | Ht 70.0 in | Wt 176.0 lb

## 2013-03-17 DIAGNOSIS — I872 Venous insufficiency (chronic) (peripheral): Secondary | ICD-10-CM

## 2013-03-17 DIAGNOSIS — R609 Edema, unspecified: Secondary | ICD-10-CM

## 2013-03-17 DIAGNOSIS — I4891 Unspecified atrial fibrillation: Secondary | ICD-10-CM

## 2013-03-17 DIAGNOSIS — F32A Depression, unspecified: Secondary | ICD-10-CM

## 2013-03-17 DIAGNOSIS — Z91038 Other insect allergy status: Secondary | ICD-10-CM

## 2013-03-17 DIAGNOSIS — Z23 Encounter for immunization: Secondary | ICD-10-CM | POA: Diagnosis not present

## 2013-03-17 DIAGNOSIS — Z91030 Bee allergy status: Secondary | ICD-10-CM

## 2013-03-17 DIAGNOSIS — B029 Zoster without complications: Secondary | ICD-10-CM

## 2013-03-17 DIAGNOSIS — Z954 Presence of other heart-valve replacement: Secondary | ICD-10-CM

## 2013-03-17 DIAGNOSIS — Z7901 Long term (current) use of anticoagulants: Secondary | ICD-10-CM

## 2013-03-17 DIAGNOSIS — F329 Major depressive disorder, single episode, unspecified: Secondary | ICD-10-CM

## 2013-03-17 MED ORDER — PSEUDOEPHEDRINE HCL 30 MG PO TABS: 60.0000 mg | ORAL_TABLET | ORAL | Status: DC | PRN

## 2013-03-17 MED ORDER — DIPHENHYDRAMINE HCL 25 MG PO TABS: 50.0000 mg | ORAL_TABLET | Freq: Four times a day (QID) | ORAL | Status: DC | PRN

## 2013-03-17 MED ORDER — PREDNISONE 20 MG PO TABS: 40.0000 mg | ORAL_TABLET | Freq: Every day | ORAL | Status: DC | PRN

## 2013-03-17 MED ORDER — FUROSEMIDE 20 MG PO TABS: 20.0000 mg | ORAL_TABLET | Freq: Every day | ORAL | Status: DC

## 2013-03-17 NOTE — Assessment & Plan Note (Signed)
Seems to be doing well.

## 2013-03-17 NOTE — Assessment & Plan Note (Signed)
Cont. Coumadin  

## 2013-03-17 NOTE — Assessment & Plan Note (Signed)
Continue with current prescription therapy as reflected on the Med list.  

## 2013-03-17 NOTE — Assessment & Plan Note (Addendum)
Epi-pen See other meds

## 2013-03-17 NOTE — Progress Notes (Signed)
  Subjective:    Patient ID: Jason Hawkins, male    DOB: 10-17-1927, 79 y.o.   MRN: [id number]  HPI  New pt He is here to establish PC and to address issues with his chronic A. Fib, anticoagulation, s/p AVR, OA C/o LLE/knee pain, leg swelling Wife died 2 years ago - he is grieving. Pt stopped playing tennis due to OA, he stopped fishing He lives in [place] during winter - 6 months/year  Review of Systems  Constitutional: Negative for chills, appetite change, fatigue and unexpected weight change.  HENT: Negative for nosebleeds, congestion, sore throat, sneezing, trouble swallowing and neck pain.   Eyes: Negative for itching and visual disturbance.  Respiratory: Negative for apnea, cough, shortness of breath and wheezing.   Cardiovascular: Positive for leg swelling. Negative for chest pain and palpitations.  Gastrointestinal: Negative for nausea, diarrhea, blood in stool and abdominal distention.  Genitourinary: Negative for frequency and hematuria.  Musculoskeletal: Positive for arthralgias and gait problem. Negative for back pain and joint swelling.  Skin: Negative for rash and wound.  Allergic/Immunologic: Positive for environmental allergies.  Neurological: Negative for dizziness, tremors, speech difficulty, weakness, light-headedness and headaches.  Psychiatric/Behavioral: Negative for behavioral problems, confusion, sleep disturbance, dysphoric mood and agitation. The patient is not nervous/anxious.        Objective:   Physical Exam  Constitutional: He is oriented to person, place, and time. He appears well-developed and well-nourished. No distress.  NAD  HENT:  Mouth/Throat: Oropharynx is clear and moist.  Eyes: Conjunctivae are normal. Pupils are equal, round, and reactive to light.  Neck: Normal range of motion. No JVD present. No thyromegaly present.  Cardiovascular: Normal rate, normal heart sounds and intact distal pulses.  Exam reveals no gallop and no friction rub.    No murmur heard. irreg irreg  Pulmonary/Chest: Effort normal and breath sounds normal. No respiratory distress. He has no wheezes. He has no rales. He exhibits no tenderness.  Abdominal: Soft. Bowel sounds are normal. He exhibits no distension and no mass. There is no tenderness. There is no rebound and no guarding.  Musculoskeletal: Normal range of motion. He exhibits edema (1+B). He exhibits no tenderness.  Lymphadenopathy:    He has no cervical adenopathy.  Neurological: He is alert and oriented to person, place, and time. He has normal reflexes. No cranial nerve deficit. He exhibits normal muscle tone. He displays a negative Romberg sign. Coordination and gait normal.  Skin: Skin is warm and dry. No rash noted. He is not diaphoretic.  Psychiatric: His behavior is normal. Judgment and thought content normal.  Sad     Labs and other records reviewd      Assessment & Plan:

## 2013-03-17 NOTE — Assessment & Plan Note (Signed)
Chronic: B LE - likely B venous insufficiency Rx for compr knee highs Lasix prn

## 2013-03-17 NOTE — Assessment & Plan Note (Addendum)
2014 situational; gief Declined treatment and councelling

## 2013-03-17 NOTE — Assessment & Plan Note (Signed)
Doing well Zostavax in 2 mo

## 2013-04-15 ENCOUNTER — Ambulatory Visit (INDEPENDENT_AMBULATORY_CARE_PROVIDER_SITE_OTHER): Payer: Medicare Other | Admitting: *Deleted

## 2013-04-15 DIAGNOSIS — Z7901 Long term (current) use of anticoagulants: Secondary | ICD-10-CM | POA: Diagnosis not present

## 2013-04-15 DIAGNOSIS — Z954 Presence of other heart-valve replacement: Secondary | ICD-10-CM

## 2013-04-15 DIAGNOSIS — I4891 Unspecified atrial fibrillation: Secondary | ICD-10-CM

## 2013-05-02 ENCOUNTER — Other Ambulatory Visit: Payer: Self-pay | Admitting: Cardiovascular Disease

## 2013-05-12 ENCOUNTER — Ambulatory Visit: Payer: Medicare Other

## 2013-05-12 ENCOUNTER — Ambulatory Visit (INDEPENDENT_AMBULATORY_CARE_PROVIDER_SITE_OTHER): Payer: Medicare Other | Admitting: Internal Medicine

## 2013-05-12 ENCOUNTER — Encounter: Payer: Self-pay | Admitting: Internal Medicine

## 2013-05-12 VITALS — BP 180/78 | HR 52 | Temp 97.8°F | Resp 12 | Wt 180.0 lb

## 2013-05-12 DIAGNOSIS — Z2911 Encounter for prophylactic immunotherapy for respiratory syncytial virus (RSV): Secondary | ICD-10-CM

## 2013-05-12 DIAGNOSIS — B029 Zoster without complications: Secondary | ICD-10-CM

## 2013-05-12 DIAGNOSIS — R5381 Other malaise: Secondary | ICD-10-CM | POA: Diagnosis not present

## 2013-05-12 DIAGNOSIS — I4891 Unspecified atrial fibrillation: Secondary | ICD-10-CM | POA: Diagnosis not present

## 2013-05-12 DIAGNOSIS — F329 Major depressive disorder, single episode, unspecified: Secondary | ICD-10-CM | POA: Diagnosis not present

## 2013-05-12 DIAGNOSIS — Z23 Encounter for immunization: Secondary | ICD-10-CM

## 2013-05-12 DIAGNOSIS — R030 Elevated blood-pressure reading, without diagnosis of hypertension: Secondary | ICD-10-CM

## 2013-05-12 NOTE — Assessment & Plan Note (Signed)
Pt declined Rx 

## 2013-05-12 NOTE — Assessment & Plan Note (Signed)
Resolved zostavax today

## 2013-05-12 NOTE — Assessment & Plan Note (Signed)
Continue with current prescription therapy as reflected on the Med list.  

## 2013-05-12 NOTE — Progress Notes (Signed)
  Subjective:   HPI    F/u chronic A. Fib, anticoagulation, s/p AVR, OA F/u LLE/knee pain, leg swelling Pt is asking for a heavy metals  Wife died 2 years ago - he is grieving.  Pt stopped playing tennis due to OA, he stopped fishing He lives in [place] during winter - 6 months/year  BP Readings from Last 3 Encounters:  05/12/13 180/78  03/17/13 130/68  12/26/12 132/74     Review of Systems  Constitutional: Negative for chills, appetite change, fatigue and unexpected weight change.  HENT: Negative for nosebleeds, congestion, sore throat, sneezing, trouble swallowing and neck pain.   Eyes: Negative for itching and visual disturbance.  Respiratory: Negative for apnea, cough, shortness of breath and wheezing.   Cardiovascular: Positive for leg swelling. Negative for chest pain and palpitations.  Gastrointestinal: Negative for nausea, diarrhea, blood in stool and abdominal distention.  Genitourinary: Negative for frequency and hematuria.  Musculoskeletal: Positive for arthralgias and gait problem. Negative for back pain and joint swelling.  Skin: Negative for rash and wound.  Allergic/Immunologic: Positive for environmental allergies.  Neurological: Negative for dizziness, tremors, speech difficulty, weakness, light-headedness and headaches.  Psychiatric/Behavioral: Negative for behavioral problems, confusion, sleep disturbance, dysphoric mood and agitation. The patient is not nervous/anxious.        Objective:   Physical Exam  Constitutional: He is oriented to person, place, and time. He appears well-developed and well-nourished. No distress.  NAD  HENT:  Mouth/Throat: Oropharynx is clear and moist.  Eyes: Conjunctivae are normal. Pupils are equal, round, and reactive to light.  Neck: Normal range of motion. No JVD present. No thyromegaly present.  Cardiovascular: Normal rate, normal heart sounds and intact distal pulses.  Exam reveals no gallop and no friction rub.   No  murmur heard. irreg irreg  Pulmonary/Chest: Effort normal and breath sounds normal. No respiratory distress. He has no wheezes. He has no rales. He exhibits no tenderness.  Abdominal: Soft. Bowel sounds are normal. He exhibits no distension and no mass. There is no tenderness. There is no rebound and no guarding.  Musculoskeletal: Normal range of motion. He exhibits edema (1+B). He exhibits no tenderness.  Lymphadenopathy:    He has no cervical adenopathy.  Neurological: He is alert and oriented to person, place, and time. He has normal reflexes. No cranial nerve deficit. He exhibits normal muscle tone. He displays a negative Romberg sign. Coordination and gait normal.  Skin: Skin is warm and dry. No rash noted. He is not diaphoretic.  Psychiatric: His behavior is normal. Judgment and thought content normal.  Sad     Labs and other records reviewd      Assessment & Plan:

## 2013-05-12 NOTE — Assessment & Plan Note (Signed)
10/14 elev BP today new Check bp at home

## 2013-05-14 LAB — HEAVY METALS SCREEN, URINE

## 2013-05-28 ENCOUNTER — Ambulatory Visit (INDEPENDENT_AMBULATORY_CARE_PROVIDER_SITE_OTHER): Payer: Medicare Other | Admitting: General Practice

## 2013-05-28 DIAGNOSIS — Z7901 Long term (current) use of anticoagulants: Secondary | ICD-10-CM | POA: Diagnosis not present

## 2013-05-28 DIAGNOSIS — I4891 Unspecified atrial fibrillation: Secondary | ICD-10-CM

## 2013-05-28 DIAGNOSIS — Z954 Presence of other heart-valve replacement: Secondary | ICD-10-CM | POA: Diagnosis not present

## 2013-05-28 LAB — POCT INR: INR: 3.3

## 2013-06-11 ENCOUNTER — Other Ambulatory Visit: Payer: Self-pay

## 2013-07-13 DIAGNOSIS — Z6831 Body mass index (BMI) 31.0-31.9, adult: Secondary | ICD-10-CM | POA: Diagnosis not present

## 2013-07-13 DIAGNOSIS — I4891 Unspecified atrial fibrillation: Secondary | ICD-10-CM | POA: Diagnosis not present

## 2013-07-13 DIAGNOSIS — Z6825 Body mass index (BMI) 25.0-25.9, adult: Secondary | ICD-10-CM | POA: Diagnosis not present

## 2013-07-13 DIAGNOSIS — M79609 Pain in unspecified limb: Secondary | ICD-10-CM | POA: Diagnosis not present

## 2013-07-21 DIAGNOSIS — M79609 Pain in unspecified limb: Secondary | ICD-10-CM | POA: Diagnosis not present

## 2013-07-23 DIAGNOSIS — I739 Peripheral vascular disease, unspecified: Secondary | ICD-10-CM | POA: Diagnosis not present

## 2013-08-14 DIAGNOSIS — I4891 Unspecified atrial fibrillation: Secondary | ICD-10-CM | POA: Diagnosis not present

## 2013-08-14 DIAGNOSIS — I10 Essential (primary) hypertension: Secondary | ICD-10-CM | POA: Diagnosis not present

## 2013-08-14 DIAGNOSIS — C61 Malignant neoplasm of prostate: Secondary | ICD-10-CM | POA: Diagnosis not present

## 2013-08-14 DIAGNOSIS — E785 Hyperlipidemia, unspecified: Secondary | ICD-10-CM | POA: Diagnosis not present

## 2013-08-17 DIAGNOSIS — I70219 Atherosclerosis of native arteries of extremities with intermittent claudication, unspecified extremity: Secondary | ICD-10-CM | POA: Diagnosis not present

## 2013-08-25 DIAGNOSIS — I70219 Atherosclerosis of native arteries of extremities with intermittent claudication, unspecified extremity: Secondary | ICD-10-CM | POA: Diagnosis not present

## 2013-08-25 DIAGNOSIS — I700 Atherosclerosis of aorta: Secondary | ICD-10-CM | POA: Diagnosis not present

## 2013-08-25 DIAGNOSIS — I771 Stricture of artery: Secondary | ICD-10-CM | POA: Diagnosis not present

## 2013-09-02 DIAGNOSIS — I10 Essential (primary) hypertension: Secondary | ICD-10-CM | POA: Diagnosis not present

## 2013-09-04 DIAGNOSIS — I70219 Atherosclerosis of native arteries of extremities with intermittent claudication, unspecified extremity: Secondary | ICD-10-CM | POA: Diagnosis not present

## 2013-09-09 DIAGNOSIS — I70219 Atherosclerosis of native arteries of extremities with intermittent claudication, unspecified extremity: Secondary | ICD-10-CM | POA: Diagnosis not present

## 2013-09-09 DIAGNOSIS — Z7901 Long term (current) use of anticoagulants: Secondary | ICD-10-CM | POA: Diagnosis not present

## 2013-09-09 DIAGNOSIS — I7092 Chronic total occlusion of artery of the extremities: Secondary | ICD-10-CM | POA: Diagnosis not present

## 2013-09-09 DIAGNOSIS — E785 Hyperlipidemia, unspecified: Secondary | ICD-10-CM | POA: Diagnosis not present

## 2013-09-09 DIAGNOSIS — Z87891 Personal history of nicotine dependence: Secondary | ICD-10-CM | POA: Diagnosis not present

## 2013-10-30 ENCOUNTER — Other Ambulatory Visit: Payer: Self-pay | Admitting: Cardiovascular Disease

## 2013-12-21 ENCOUNTER — Ambulatory Visit (INDEPENDENT_AMBULATORY_CARE_PROVIDER_SITE_OTHER): Payer: Medicare Other | Admitting: Internal Medicine

## 2013-12-21 ENCOUNTER — Encounter: Payer: Self-pay | Admitting: Internal Medicine

## 2013-12-21 VITALS — BP 150/72 | HR 60 | Temp 97.5°F | Resp 16 | Wt 186.0 lb

## 2013-12-21 DIAGNOSIS — E785 Hyperlipidemia, unspecified: Secondary | ICD-10-CM

## 2013-12-21 DIAGNOSIS — F32A Depression, unspecified: Secondary | ICD-10-CM

## 2013-12-21 DIAGNOSIS — Z954 Presence of other heart-valve replacement: Secondary | ICD-10-CM

## 2013-12-21 DIAGNOSIS — I739 Peripheral vascular disease, unspecified: Secondary | ICD-10-CM | POA: Insufficient documentation

## 2013-12-21 DIAGNOSIS — F329 Major depressive disorder, single episode, unspecified: Secondary | ICD-10-CM | POA: Diagnosis not present

## 2013-12-21 DIAGNOSIS — R030 Elevated blood-pressure reading, without diagnosis of hypertension: Secondary | ICD-10-CM

## 2013-12-21 DIAGNOSIS — IMO0001 Reserved for inherently not codable concepts without codable children: Secondary | ICD-10-CM

## 2013-12-21 DIAGNOSIS — Z7901 Long term (current) use of anticoagulants: Secondary | ICD-10-CM | POA: Diagnosis not present

## 2013-12-21 DIAGNOSIS — F3289 Other specified depressive episodes: Secondary | ICD-10-CM

## 2013-12-21 DIAGNOSIS — N320 Bladder-neck obstruction: Secondary | ICD-10-CM

## 2013-12-21 NOTE — Assessment & Plan Note (Signed)
Continue with current prescription therapy as reflected on the Med list.  

## 2013-12-21 NOTE — Assessment & Plan Note (Addendum)
Continue with current prescription therapy as reflected on the Med list. Coumadin clinic

## 2013-12-21 NOTE — Assessment & Plan Note (Signed)
BP Readings from Last 3 Encounters:  12/21/13 150/72  05/12/13 180/78  03/17/13 130/68

## 2013-12-21 NOTE — Assessment & Plan Note (Addendum)
Continue with current prescription therapy as reflected on the Med list.  

## 2013-12-21 NOTE — Assessment & Plan Note (Signed)
LLE stent 1/15 in [place]

## 2013-12-21 NOTE — Progress Notes (Signed)
   Subjective:   HPI  F/u chronic A. Fib, anticoagulation, s/p AVR, OA F/u LLE/knee pain, leg swelling Pt is asking for a heavy metals screen  Wife died 3 years ago - he is grieving.  Pt stopped playing tennis due to OA, he stopped fishing He lives in [place] during winter - 6 months/year  BP Readings from Last 3 Encounters:  12/21/13 150/72  05/12/13 180/78  03/17/13 130/68   Wt Readings from Last 3 Encounters:  12/21/13 186 lb (84.369 kg)  05/12/13 180 lb (81.647 kg)  03/17/13 176 lb (79.833 kg)      Review of Systems  Constitutional: Negative for chills, appetite change, fatigue and unexpected weight change.  HENT: Negative for congestion, nosebleeds, sneezing, sore throat and trouble swallowing.   Eyes: Negative for itching and visual disturbance.  Respiratory: Negative for apnea, cough, shortness of breath and wheezing.   Cardiovascular: Positive for leg swelling. Negative for chest pain and palpitations.  Gastrointestinal: Negative for nausea, diarrhea, blood in stool and abdominal distention.  Genitourinary: Negative for frequency and hematuria.  Musculoskeletal: Positive for arthralgias and gait problem. Negative for back pain, joint swelling and neck pain.  Skin: Negative for rash and wound.  Allergic/Immunologic: Positive for environmental allergies.  Neurological: Negative for dizziness, tremors, speech difficulty, weakness, light-headedness and headaches.  Psychiatric/Behavioral: Negative for behavioral problems, confusion, sleep disturbance, dysphoric mood and agitation. The patient is not nervous/anxious.        Objective:   Physical Exam  Constitutional: He is oriented to person, place, and time. He appears well-developed and well-nourished. No distress.  NAD  HENT:  Mouth/Throat: Oropharynx is clear and moist.  Eyes: Conjunctivae are normal. Pupils are equal, round, and reactive to light.  Neck: Normal range of motion. No JVD present. No thyromegaly  present.  Cardiovascular: Normal rate, normal heart sounds and intact distal pulses.  Exam reveals no gallop and no friction rub.   No murmur heard. irreg irreg  Pulmonary/Chest: Effort normal and breath sounds normal. No respiratory distress. He has no wheezes. He has no rales. He exhibits no tenderness.  Abdominal: Soft. Bowel sounds are normal. He exhibits no distension and no mass. There is no tenderness. There is no rebound and no guarding.  Musculoskeletal: Normal range of motion. He exhibits edema (1+B). He exhibits no tenderness.  Lymphadenopathy:    He has no cervical adenopathy.  Neurological: He is alert and oriented to person, place, and time. He has normal reflexes. No cranial nerve deficit. He exhibits normal muscle tone. He displays a negative Romberg sign. Coordination and gait normal.  Skin: Skin is warm and dry. No rash noted. He is not diaphoretic.  Psychiatric: His behavior is normal. Judgment and thought content normal.  Sad     Labs and other records reviewd      Assessment & Plan:

## 2013-12-21 NOTE — Assessment & Plan Note (Signed)
Labs

## 2013-12-21 NOTE — Progress Notes (Signed)
Pre visit review using our clinic review tool, if applicable. No additional management support is needed unless otherwise documented below in the visit note. 

## 2013-12-22 ENCOUNTER — Encounter: Payer: Self-pay | Admitting: Internal Medicine

## 2013-12-23 ENCOUNTER — Other Ambulatory Visit: Payer: Self-pay | Admitting: Dermatology

## 2013-12-23 DIAGNOSIS — L905 Scar conditions and fibrosis of skin: Secondary | ICD-10-CM | POA: Diagnosis not present

## 2013-12-23 DIAGNOSIS — D485 Neoplasm of uncertain behavior of skin: Secondary | ICD-10-CM | POA: Diagnosis not present

## 2013-12-29 ENCOUNTER — Encounter: Payer: Self-pay | Admitting: *Deleted

## 2014-01-08 ENCOUNTER — Other Ambulatory Visit: Payer: Self-pay | Admitting: *Deleted

## 2014-01-08 ENCOUNTER — Telehealth: Payer: Self-pay | Admitting: *Deleted

## 2014-01-08 MED ORDER — METOPROLOL SUCCINATE ER 50 MG PO TB24: ORAL_TABLET | ORAL | Status: DC

## 2014-01-08 NOTE — Telephone Encounter (Signed)
Patient [facility]. Thanks, MI

## 2014-01-13 ENCOUNTER — Ambulatory Visit (INDEPENDENT_AMBULATORY_CARE_PROVIDER_SITE_OTHER): Payer: Medicare Other | Admitting: Pharmacist

## 2014-01-13 DIAGNOSIS — I4891 Unspecified atrial fibrillation: Secondary | ICD-10-CM

## 2014-01-13 DIAGNOSIS — Z7901 Long term (current) use of anticoagulants: Secondary | ICD-10-CM | POA: Diagnosis not present

## 2014-01-13 DIAGNOSIS — Z954 Presence of other heart-valve replacement: Secondary | ICD-10-CM | POA: Diagnosis not present

## 2014-01-13 LAB — POCT INR: INR: 3

## 2014-01-13 MED ORDER — WARFARIN SODIUM 5 MG PO TABS: 5.0000 mg | ORAL_TABLET | ORAL | Status: DC

## 2014-01-20 ENCOUNTER — Other Ambulatory Visit: Payer: Self-pay

## 2014-01-20 MED ORDER — WARFARIN SODIUM 5 MG PO TABS: 5.0000 mg | ORAL_TABLET | ORAL | Status: DC

## 2014-02-10 ENCOUNTER — Ambulatory Visit (INDEPENDENT_AMBULATORY_CARE_PROVIDER_SITE_OTHER): Payer: Medicare Other | Admitting: Pharmacist

## 2014-02-10 DIAGNOSIS — Z7901 Long term (current) use of anticoagulants: Secondary | ICD-10-CM

## 2014-02-10 DIAGNOSIS — H905 Unspecified sensorineural hearing loss: Secondary | ICD-10-CM | POA: Diagnosis not present

## 2014-02-10 DIAGNOSIS — I4891 Unspecified atrial fibrillation: Secondary | ICD-10-CM

## 2014-02-10 DIAGNOSIS — Z954 Presence of other heart-valve replacement: Secondary | ICD-10-CM

## 2014-02-10 LAB — POCT INR: INR: 3.2

## 2014-02-15 DIAGNOSIS — H905 Unspecified sensorineural hearing loss: Secondary | ICD-10-CM | POA: Diagnosis not present

## 2014-02-15 DIAGNOSIS — J323 Chronic sphenoidal sinusitis: Secondary | ICD-10-CM | POA: Diagnosis not present

## 2014-02-17 DIAGNOSIS — H905 Unspecified sensorineural hearing loss: Secondary | ICD-10-CM | POA: Diagnosis not present

## 2014-03-03 ENCOUNTER — Ambulatory Visit (INDEPENDENT_AMBULATORY_CARE_PROVIDER_SITE_OTHER): Payer: Medicare Other | Admitting: *Deleted

## 2014-03-03 DIAGNOSIS — Z954 Presence of other heart-valve replacement: Secondary | ICD-10-CM

## 2014-03-03 DIAGNOSIS — Z7901 Long term (current) use of anticoagulants: Secondary | ICD-10-CM | POA: Diagnosis not present

## 2014-03-03 DIAGNOSIS — I4891 Unspecified atrial fibrillation: Secondary | ICD-10-CM | POA: Diagnosis not present

## 2014-03-03 LAB — POCT INR: INR: 2.5

## 2014-03-10 ENCOUNTER — Encounter: Payer: Self-pay | Admitting: Cardiovascular Disease

## 2014-03-10 ENCOUNTER — Ambulatory Visit (INDEPENDENT_AMBULATORY_CARE_PROVIDER_SITE_OTHER): Payer: Medicare Other | Admitting: Cardiovascular Disease

## 2014-03-10 VITALS — BP 182/90 | HR 52 | Ht 70.0 in | Wt 182.8 lb

## 2014-03-10 DIAGNOSIS — I872 Venous insufficiency (chronic) (peripheral): Secondary | ICD-10-CM

## 2014-03-10 DIAGNOSIS — R030 Elevated blood-pressure reading, without diagnosis of hypertension: Secondary | ICD-10-CM

## 2014-03-10 DIAGNOSIS — F3289 Other specified depressive episodes: Secondary | ICD-10-CM | POA: Diagnosis not present

## 2014-03-10 DIAGNOSIS — I4891 Unspecified atrial fibrillation: Secondary | ICD-10-CM | POA: Diagnosis not present

## 2014-03-10 DIAGNOSIS — Z954 Presence of other heart-valve replacement: Secondary | ICD-10-CM | POA: Diagnosis not present

## 2014-03-10 DIAGNOSIS — IMO0001 Reserved for inherently not codable concepts without codable children: Secondary | ICD-10-CM

## 2014-03-10 DIAGNOSIS — F329 Major depressive disorder, single episode, unspecified: Secondary | ICD-10-CM | POA: Diagnosis not present

## 2014-03-10 DIAGNOSIS — I482 Chronic atrial fibrillation, unspecified: Secondary | ICD-10-CM

## 2014-03-10 DIAGNOSIS — F32A Depression, unspecified: Secondary | ICD-10-CM

## 2014-03-10 DIAGNOSIS — E785 Hyperlipidemia, unspecified: Secondary | ICD-10-CM

## 2014-03-10 MED ORDER — TESTOSTERONE 2.5 MG/24HR TD PT24: 1.0000 | MEDICATED_PATCH | Freq: Every day | TRANSDERMAL | Status: DC

## 2014-03-10 MED ORDER — METOPROLOL SUCCINATE ER 25 MG PO TB24: ORAL_TABLET | ORAL | Status: DC

## 2014-03-10 MED ORDER — FUROSEMIDE 20 MG PO TABS: 20.0000 mg | ORAL_TABLET | Freq: Every day | ORAL | Status: DC

## 2014-03-10 MED ORDER — LOSARTAN POTASSIUM-HCTZ 50-12.5 MG PO TABS: 1.0000 | ORAL_TABLET | Freq: Every day | ORAL | Status: DC

## 2014-03-10 NOTE — Progress Notes (Signed)
Patient ID: Jason Hawkins, male   DOB: 1927-10-12, 80 y.o.   MRN: [id number] Jason Hawkins is seen today for F/U Still not same since wife died  They were married for over 39 years and she was a patient of ours as well. Jason Hawkins has a history of AVR and coumadin and afib. He has some dementia. He has not had any TIA's or bleeding complications although he forgets his coumadin from time to time. He denies palpitations, SSCP, dyspnea or syncope. He broke his left tibia in his 70" and has mild chronic LE edema.  He has let his hair grow long Jason Hawkins use to cut it and he will not cut it again Needs coumadin refill  Has LE venous isuf. And is not taking his diuretic Wants a testosterone patch for muscle loss and "vigor" Also wants armor thyroid "because" we are all low"   ROS: Denies fever, malais, weight loss, blurry vision, decreased visual acuity, cough, sputum, SOB, hemoptysis, pleuritic pain, palpitaitons, heartburn, abdominal pain, melena, lower extremity edema, claudication, or rash.  All other systems reviewed and negative  General: Affect appropriate Healthy:  appears stated age 80: normal Neck supple with no adenopathy JVP normal no bruits no thyromegaly Lungs clear with no wheezing and good diaphragmatic motion Heart:  S1/S2 SEM  murmur, no rub, gallop or click PMI normal Abdomen: benighn, BS positve, no tenderness, no AAA no bruit.  No HSM or HJR Distal pulses intact with no bruits Plus 2 bilateral  Edema with stasis  Neuro non-focal Skin warm and dry No muscular weakness   Current Outpatient Prescriptions  Medication Sig Dispense Refill  . co-enzyme Q-10 50 MG capsule Take 50 mg by mouth daily.      . diphenhydrAMINE (BENADRYL) 25 MG tablet Take 2 tablets (50 mg total) by mouth every 6 (six) hours as needed for itching or allergies (bee sting).  30 tablet  0  . furosemide (LASIX) 20 MG tablet Take 1 tablet (20 mg total) by mouth daily.  30 tablet  3  . GLUCOSAMINE-CHONDROITIN PO Take 1  tablet by mouth daily.        [name] Hyaluronic Acid 20-60 MG CAPS Take 1 tablet by mouth daily.        . metoprolol succinate (TOPROL-XL) 50 MG 24 hr tablet TAKE 1 TABLET DAILY  90 tablet  0  . Multiple Vitamin (MULTIVITAMIN) capsule Take 1 capsule by mouth daily.        . pseudoephedrine (SUDAFED) 30 MG tablet Take 2 tablets (60 mg total) by mouth every 4 (four) hours as needed for congestion (bee sting).  60 tablet  1  . Red Yeast Rice 600 MG CAPS Take 1 capsule by mouth daily.        . TURMERIC PO Take 1 tablet by mouth daily.        [name] warfarin (COUMADIN) 5 MG tablet Take 1 tablet (5 mg total) by mouth as directed.  10 tablet  0   No current facility-administered medications for this visit.    Allergies  Codeine  Electrocardiogram:afib rate 52  LAD    Assessment and Plan

## 2014-03-10 NOTE — Assessment & Plan Note (Signed)
Cholesterol is at goal.  Continue current dose of statin and diet Rx.  No myalgias or side effects.  F/U  LFT's in 6 months. No results found for this basename: LDLCALC             

## 2014-03-10 NOTE — Assessment & Plan Note (Signed)
Decrease Toprol for bradycardia continue coumadin

## 2014-03-10 NOTE — Assessment & Plan Note (Signed)
Encouraged him to take diuretic and use compression stockings

## 2014-03-10 NOTE — Patient Instructions (Addendum)
Your physician wants you to follow-up in: [place] will receive a reminder letter in the mail two months in advance. If you don't receive a letter, please call our office to schedule the follow-up appointment. Your physician has recommended you make the following change in your medication:  DECREASE  METOPROLOL TO   25 MG  EVERY DAY  Your physician recommends that you return for lab work in:  BMET  DUE THE  WEEK OF   OCT  5 Jason Hawkins

## 2014-03-10 NOTE — Assessment & Plan Note (Signed)
Normal exam no AR  SBE prophylaxis

## 2014-03-10 NOTE — Assessment & Plan Note (Signed)
Some demetia but not as moribound as before.  Will call in testosterone patch for him but no armor thyroid without TSH/T3 and T4  But Jason Hawkins is convinced he needs it even if labs normal

## 2014-03-10 NOTE — Assessment & Plan Note (Signed)
Medical noncompliance  Called in Hyzaar 50/12.5 in lieu of him not taking lasix  BMET in 8 weeks

## 2014-03-15 ENCOUNTER — Other Ambulatory Visit: Payer: Self-pay

## 2014-03-15 MED ORDER — TESTOSTERONE 2.5 MG/24HR TD PT24: 1.0000 | MEDICATED_PATCH | Freq: Every day | TRANSDERMAL | Status: DC

## 2014-03-17 ENCOUNTER — Telehealth: Payer: Self-pay | Admitting: Cardiovascular Disease

## 2014-03-17 NOTE — Telephone Encounter (Signed)
°  Patient has questions about medication. Please call and advise.

## 2014-03-17 NOTE — Telephone Encounter (Signed)
Pt  has been taking the testosterone medication for sometime. Pt states now the insurance needs to be call for   Approval. Pt states this is the first time the insurance does that with this medication

## 2014-03-24 ENCOUNTER — Telehealth: Payer: Self-pay | Admitting: Cardiovascular Disease

## 2014-03-24 NOTE — Telephone Encounter (Signed)
°  Patient wants a call back ASAP regarding Testerone medication. Please call and advise.

## 2014-03-24 NOTE — Telephone Encounter (Addendum)
**Note De-Identified Jason Hawkins Obfuscation** The pt states that he has been calling this office since the 5th of August trying to get a prior auth for his Androderm patch. He states that he has been told that the diagnosis may need to be changed from muscle loss to another diagnosis. He is requesting a call back from Dr Mariana Arn nurse Altha Harm to let him know where she is at in this process.  Note forwarded to [facility].

## 2014-03-24 NOTE — Telephone Encounter (Signed)
See phone noted dated 03/17/14 for call back to pt regarding this call.

## 2014-03-26 ENCOUNTER — Other Ambulatory Visit: Payer: Self-pay | Admitting: *Deleted

## 2014-03-26 NOTE — Addendum Note (Signed)
Addended by: Devra Dopp E on: 03/26/2014 01:31 PM   Modules accepted: Orders

## 2014-03-26 NOTE — Telephone Encounter (Signed)
PT NOTIFIED   WAS UNABLE TO  GET  TESTOSTERONE COVERED BY  INSURANCE   SPENT  OVER  1  HOUR  ON PHONE  WITH  APPEALS  DEPARTMENT  AS  WELL AS  [facility]  INFORMED  PT T O  CONTACT PMD   TO SEE  IF  HE  COULD  GET  MED  ORDERED  AFTER  DOING   SOME  TESTING   THAT DOCUMENTED  LOW   TESTOSTERONE  .Adonis Housekeeper

## 2014-03-31 ENCOUNTER — Encounter: Payer: Self-pay | Admitting: Internal Medicine

## 2014-03-31 ENCOUNTER — Ambulatory Visit (INDEPENDENT_AMBULATORY_CARE_PROVIDER_SITE_OTHER): Payer: Medicare Other | Admitting: *Deleted

## 2014-03-31 DIAGNOSIS — I4891 Unspecified atrial fibrillation: Secondary | ICD-10-CM | POA: Diagnosis not present

## 2014-03-31 DIAGNOSIS — Z7901 Long term (current) use of anticoagulants: Secondary | ICD-10-CM | POA: Diagnosis not present

## 2014-03-31 DIAGNOSIS — Z954 Presence of other heart-valve replacement: Secondary | ICD-10-CM

## 2014-03-31 LAB — POCT INR: INR: 2.3

## 2014-04-01 ENCOUNTER — Encounter: Payer: Self-pay | Admitting: Internal Medicine

## 2014-04-05 ENCOUNTER — Encounter: Payer: Self-pay | Admitting: Internal Medicine

## 2014-04-05 ENCOUNTER — Other Ambulatory Visit: Payer: Self-pay | Admitting: Internal Medicine

## 2014-04-05 DIAGNOSIS — I480 Paroxysmal atrial fibrillation: Secondary | ICD-10-CM

## 2014-04-05 DIAGNOSIS — R5381 Other malaise: Secondary | ICD-10-CM

## 2014-04-05 DIAGNOSIS — R5383 Other fatigue: Principal | ICD-10-CM

## 2014-04-09 ENCOUNTER — Other Ambulatory Visit: Payer: Self-pay | Admitting: *Deleted

## 2014-04-09 ENCOUNTER — Telehealth: Payer: Self-pay | Admitting: Cardiovascular Disease

## 2014-04-09 ENCOUNTER — Other Ambulatory Visit: Payer: Self-pay | Admitting: Cardiovascular Disease

## 2014-04-09 MED ORDER — LOSARTAN POTASSIUM-HCTZ 50-12.5 MG PO TABS: 1.0000 | ORAL_TABLET | Freq: Every day | ORAL | Status: DC

## 2014-04-09 MED ORDER — METOPROLOL SUCCINATE ER 25 MG PO TB24: ORAL_TABLET | ORAL | Status: DC

## 2014-04-09 NOTE — Telephone Encounter (Signed)
New message     Need presc for [facility].----

## 2014-04-28 ENCOUNTER — Ambulatory Visit (INDEPENDENT_AMBULATORY_CARE_PROVIDER_SITE_OTHER): Payer: Medicare Other | Admitting: Pharmacist

## 2014-04-28 ENCOUNTER — Ambulatory Visit (INDEPENDENT_AMBULATORY_CARE_PROVIDER_SITE_OTHER): Payer: Medicare Other

## 2014-04-28 DIAGNOSIS — IMO0001 Reserved for inherently not codable concepts without codable children: Secondary | ICD-10-CM

## 2014-04-28 DIAGNOSIS — I739 Peripheral vascular disease, unspecified: Secondary | ICD-10-CM | POA: Diagnosis not present

## 2014-04-28 DIAGNOSIS — Z7901 Long term (current) use of anticoagulants: Secondary | ICD-10-CM

## 2014-04-28 DIAGNOSIS — N320 Bladder-neck obstruction: Secondary | ICD-10-CM

## 2014-04-28 DIAGNOSIS — F329 Major depressive disorder, single episode, unspecified: Secondary | ICD-10-CM

## 2014-04-28 DIAGNOSIS — E785 Hyperlipidemia, unspecified: Secondary | ICD-10-CM | POA: Diagnosis not present

## 2014-04-28 DIAGNOSIS — I4891 Unspecified atrial fibrillation: Secondary | ICD-10-CM

## 2014-04-28 DIAGNOSIS — F32A Depression, unspecified: Secondary | ICD-10-CM

## 2014-04-28 DIAGNOSIS — R5381 Other malaise: Secondary | ICD-10-CM | POA: Diagnosis not present

## 2014-04-28 DIAGNOSIS — I480 Paroxysmal atrial fibrillation: Secondary | ICD-10-CM

## 2014-04-28 DIAGNOSIS — Z954 Presence of other heart-valve replacement: Secondary | ICD-10-CM | POA: Diagnosis not present

## 2014-04-28 DIAGNOSIS — R030 Elevated blood-pressure reading, without diagnosis of hypertension: Secondary | ICD-10-CM

## 2014-04-28 DIAGNOSIS — R5383 Other fatigue: Secondary | ICD-10-CM | POA: Diagnosis not present

## 2014-04-28 DIAGNOSIS — F3289 Other specified depressive episodes: Secondary | ICD-10-CM | POA: Diagnosis not present

## 2014-04-28 LAB — LIPID PANEL
Cholesterol: 195 mg/dL (ref 0–200)
HDL: 62 mg/dL (ref >39.00)
LDL Cholesterol: 120 mg/dL — ABNORMAL HIGH (ref 0–99)
NONHDL: 133
Total CHOL/HDL Ratio: 3
Triglycerides: 67 mg/dL (ref 0.0–149.0)
VLDL: 13.4 mg/dL (ref 0.0–40.0)

## 2014-04-28 LAB — HEPATIC FUNCTION PANEL
ALT: 20 U/L (ref 0–53)
AST: 27 U/L (ref 0–37)
Albumin: 4 g/dL (ref 3.5–5.2)
Alkaline Phosphatase: 65 U/L (ref 39–117)
BILIRUBIN TOTAL: 1.4 mg/dL — AB (ref 0.2–1.2)
Bilirubin, Direct: 0.2 mg/dL (ref 0.0–0.3)
Total Protein: 7.1 g/dL (ref 6.0–8.3)

## 2014-04-28 LAB — CBC WITH DIFFERENTIAL/PLATELET
BASOS ABS: 0 10*3/uL (ref 0.0–0.1)
Basophils Relative: 0.3 % (ref 0.0–3.0)
EOS ABS: 0.3 10*3/uL (ref 0.0–0.7)
Eosinophils Relative: 4 % (ref 0.0–5.0)
HCT: 41.1 % (ref 39.0–52.0)
Hemoglobin: 13.7 g/dL (ref 13.0–17.0)
Lymphocytes Relative: 28.5 % (ref 12.0–46.0)
Lymphs Abs: 2.4 10*3/uL (ref 0.7–4.0)
MCHC: 33.3 g/dL (ref 30.0–36.0)
MCV: 95.7 fl (ref 78.0–100.0)
MONO ABS: 0.9 10*3/uL (ref 0.1–1.0)
Monocytes Relative: 11.1 % (ref 3.0–12.0)
Neutro Abs: 4.6 10*3/uL (ref 1.4–7.7)
Neutrophils Relative %: 56.1 % (ref 43.0–77.0)
PLATELETS: 219 10*3/uL (ref 150.0–400.0)
RBC: 4.3 Mil/uL (ref 4.22–5.81)
RDW: 13.9 % (ref 11.5–15.5)
WBC: 8.3 10*3/uL (ref 4.0–10.5)

## 2014-04-28 LAB — PSA: PSA: 0.2 ng/mL (ref 0.10–4.00)

## 2014-04-28 LAB — TSH: TSH: 1.26 u[IU]/mL (ref 0.35–4.50)

## 2014-04-28 LAB — BASIC METABOLIC PANEL
BUN: 12 mg/dL (ref 6–23)
CO2: 30 mEq/L (ref 19–32)
Calcium: 9.2 mg/dL (ref 8.4–10.5)
Chloride: 103 mEq/L (ref 96–112)
Creatinine, Ser: 0.8 mg/dL (ref 0.4–1.5)
GFR: 93.74 mL/min (ref >60.00)
Glucose, Bld: 83 mg/dL (ref 70–99)
POTASSIUM: 4.2 meq/L (ref 3.5–5.1)
Sodium: 139 mEq/L (ref 135–145)

## 2014-04-28 LAB — POCT INR: INR: 2.7

## 2014-04-30 ENCOUNTER — Ambulatory Visit (INDEPENDENT_AMBULATORY_CARE_PROVIDER_SITE_OTHER): Payer: Medicare Other | Admitting: Internal Medicine

## 2014-04-30 ENCOUNTER — Encounter: Payer: Self-pay | Admitting: Internal Medicine

## 2014-04-30 VITALS — BP 152/88 | HR 56 | Temp 98.5°F | Wt 182.0 lb

## 2014-04-30 DIAGNOSIS — F3289 Other specified depressive episodes: Secondary | ICD-10-CM

## 2014-04-30 DIAGNOSIS — Z954 Presence of other heart-valve replacement: Secondary | ICD-10-CM

## 2014-04-30 DIAGNOSIS — R5382 Chronic fatigue, unspecified: Secondary | ICD-10-CM

## 2014-04-30 DIAGNOSIS — Z8546 Personal history of malignant neoplasm of prostate: Secondary | ICD-10-CM | POA: Diagnosis not present

## 2014-04-30 DIAGNOSIS — F329 Major depressive disorder, single episode, unspecified: Secondary | ICD-10-CM | POA: Diagnosis not present

## 2014-04-30 DIAGNOSIS — IMO0001 Reserved for inherently not codable concepts without codable children: Secondary | ICD-10-CM

## 2014-04-30 DIAGNOSIS — E785 Hyperlipidemia, unspecified: Secondary | ICD-10-CM | POA: Diagnosis not present

## 2014-04-30 DIAGNOSIS — F32A Depression, unspecified: Secondary | ICD-10-CM

## 2014-04-30 DIAGNOSIS — R030 Elevated blood-pressure reading, without diagnosis of hypertension: Secondary | ICD-10-CM

## 2014-04-30 DIAGNOSIS — R5383 Other fatigue: Secondary | ICD-10-CM | POA: Insufficient documentation

## 2014-04-30 DIAGNOSIS — R5381 Other malaise: Secondary | ICD-10-CM

## 2014-04-30 NOTE — Assessment & Plan Note (Signed)
Chronic  On red rice yeast

## 2014-04-30 NOTE — Progress Notes (Signed)
Pre visit review using our clinic review tool, if applicable. No additional management support is needed unless otherwise documented below in the visit note. 

## 2014-04-30 NOTE — Assessment & Plan Note (Addendum)
Not taking his BP meds now regular Re-start Rx  Risks associated with treatment noncompliance were discussed. Compliance was encouraged.

## 2014-04-30 NOTE — Progress Notes (Signed)
   Subjective:   HPI  F/u chronic A. Fib, anticoagulation, s/p AVR, OA F/u LLE/knee pain, leg swelling Pt is asking for a heavy metals screen  Wife died 3 years ago - he is grieving.  Pt stopped playing tennis due to OA, he stopped fishing He lives in [place] during winter - 6 months/year  BP is ok at home  BP Readings from Last 3 Encounters:  04/30/14 152/88  03/10/14 182/90  12/21/13 150/72   Wt Readings from Last 3 Encounters:  04/30/14 182 lb (82.555 kg)  03/10/14 182 lb 12.8 oz (82.918 kg)  12/21/13 186 lb (84.369 kg)      Review of Systems  Constitutional: Negative for chills, appetite change, fatigue and unexpected weight change.  HENT: Negative for congestion, nosebleeds, sneezing, sore throat and trouble swallowing.   Eyes: Negative for itching and visual disturbance.  Respiratory: Negative for apnea, cough, shortness of breath and wheezing.   Cardiovascular: Positive for leg swelling. Negative for chest pain and palpitations.  Gastrointestinal: Negative for nausea, diarrhea, blood in stool and abdominal distention.  Genitourinary: Negative for frequency and hematuria.  Musculoskeletal: Positive for arthralgias and gait problem. Negative for back pain, joint swelling and neck pain.  Skin: Negative for rash and wound.  Allergic/Immunologic: Positive for environmental allergies.  Neurological: Negative for dizziness, tremors, speech difficulty, weakness, light-headedness and headaches.  Psychiatric/Behavioral: Negative for behavioral problems, confusion, sleep disturbance, dysphoric mood and agitation. The patient is not nervous/anxious.        Objective:   Physical Exam  Constitutional: He is oriented to person, place, and time. He appears well-developed and well-nourished. No distress.  NAD  HENT:  Mouth/Throat: Oropharynx is clear and moist.  Eyes: Conjunctivae are normal. Pupils are equal, round, and reactive to light.  Neck: Normal range of motion. No  JVD present. No thyromegaly present.  Cardiovascular: Normal rate, normal heart sounds and intact distal pulses.  Exam reveals no gallop and no friction rub.   No murmur heard. irreg irreg  Pulmonary/Chest: Effort normal and breath sounds normal. No respiratory distress. He has no wheezes. He has no rales. He exhibits no tenderness.  Abdominal: Soft. Bowel sounds are normal. He exhibits no distension and no mass. There is no tenderness. There is no rebound and no guarding.  Musculoskeletal: Normal range of motion. He exhibits edema (1+B). He exhibits no tenderness.  Lymphadenopathy:    He has no cervical adenopathy.  Neurological: He is alert and oriented to person, place, and time. He has normal reflexes. No cranial nerve deficit. He exhibits normal muscle tone. He displays a negative Romberg sign. Coordination and gait normal.  Skin: Skin is warm and dry. No rash noted. He is not diaphoretic.  Psychiatric: His behavior is normal. Judgment and thought content normal.  Sad     Lab Results  Component Value Date   WBC 8.3 04/28/2014   HGB 13.7 04/28/2014   HCT 41.1 04/28/2014   PLT 219.0 04/28/2014   GLUCOSE 83 04/28/2014   CHOL 195 04/28/2014   TRIG 67.0 04/28/2014   HDL 62.00 04/28/2014   LDLCALC 120* 04/28/2014   ALT 20 04/28/2014   AST 27 04/28/2014   NA 139 04/28/2014   K 4.2 04/28/2014   CL 103 04/28/2014   CREATININE 0.8 04/28/2014   BUN 12 04/28/2014   CO2 30 04/28/2014   TSH 1.26 04/28/2014   PSA 0.20 04/28/2014   INR 2.7 04/28/2014       Assessment & Plan:

## 2014-04-30 NOTE — Assessment & Plan Note (Signed)
Remote H/o XRT

## 2014-04-30 NOTE — Assessment & Plan Note (Signed)
Dr Johnsie Cancel Remote  Continue with current prescription therapy as reflected on the Med list.

## 2014-04-30 NOTE — Assessment & Plan Note (Addendum)
Labs Jason Hawkins is asking about Testosterone  - he has a h/o prostate ca - [place] can't have it

## 2014-04-30 NOTE — Assessment & Plan Note (Signed)
Continue with current prescription therapy as reflected on the Med list.  

## 2014-05-04 LAB — HEAVY METALS, RANDOM URINE
ARSENICRANUR: 9 ug/g{creat} (ref <51)
Creatinine Random, Urine: 101.9 mg/dL (ref 20.0–370.0)
Lead Random, Urine: 2 mcg/g creat (ref <10)

## 2014-05-13 ENCOUNTER — Other Ambulatory Visit: Payer: Medicare Other

## 2014-05-19 ENCOUNTER — Ambulatory Visit: Payer: Medicare Other | Admitting: Internal Medicine

## 2014-10-09 DIAGNOSIS — R51 Headache: Secondary | ICD-10-CM | POA: Diagnosis not present

## 2014-10-09 DIAGNOSIS — R220 Localized swelling, mass and lump, head: Secondary | ICD-10-CM | POA: Diagnosis not present

## 2014-10-09 DIAGNOSIS — Z87891 Personal history of nicotine dependence: Secondary | ICD-10-CM | POA: Diagnosis not present

## 2015-01-04 ENCOUNTER — Encounter: Payer: Self-pay | Admitting: Internal Medicine

## 2015-01-04 ENCOUNTER — Ambulatory Visit (INDEPENDENT_AMBULATORY_CARE_PROVIDER_SITE_OTHER): Payer: Medicare Other | Admitting: Internal Medicine

## 2015-01-04 ENCOUNTER — Other Ambulatory Visit (INDEPENDENT_AMBULATORY_CARE_PROVIDER_SITE_OTHER): Payer: Medicare Other

## 2015-01-04 VITALS — BP 150/90 | HR 62 | Wt 184.0 lb

## 2015-01-04 DIAGNOSIS — I482 Chronic atrial fibrillation, unspecified: Secondary | ICD-10-CM

## 2015-01-04 DIAGNOSIS — F32A Depression, unspecified: Secondary | ICD-10-CM

## 2015-01-04 DIAGNOSIS — I872 Venous insufficiency (chronic) (peripheral): Secondary | ICD-10-CM

## 2015-01-04 DIAGNOSIS — R030 Elevated blood-pressure reading, without diagnosis of hypertension: Secondary | ICD-10-CM

## 2015-01-04 DIAGNOSIS — F329 Major depressive disorder, single episode, unspecified: Secondary | ICD-10-CM

## 2015-01-04 DIAGNOSIS — IMO0001 Reserved for inherently not codable concepts without codable children: Secondary | ICD-10-CM

## 2015-01-04 DIAGNOSIS — I4891 Unspecified atrial fibrillation: Secondary | ICD-10-CM | POA: Diagnosis not present

## 2015-01-04 DIAGNOSIS — Z8546 Personal history of malignant neoplasm of prostate: Secondary | ICD-10-CM | POA: Diagnosis not present

## 2015-01-04 DIAGNOSIS — R5382 Chronic fatigue, unspecified: Secondary | ICD-10-CM

## 2015-01-04 DIAGNOSIS — Z7901 Long term (current) use of anticoagulants: Secondary | ICD-10-CM

## 2015-01-04 LAB — CBC WITH DIFFERENTIAL/PLATELET
BASOS ABS: 0 10*3/uL (ref 0.0–0.1)
BASOS PCT: 0.4 % (ref 0.0–3.0)
Eosinophils Absolute: 0.3 10*3/uL (ref 0.0–0.7)
Eosinophils Relative: 3.6 % (ref 0.0–5.0)
HCT: 42.2 % (ref 39.0–52.0)
Hemoglobin: 14.3 g/dL (ref 13.0–17.0)
LYMPHS PCT: 22 % (ref 12.0–46.0)
Lymphs Abs: 1.8 10*3/uL (ref 0.7–4.0)
MCHC: 33.8 g/dL (ref 30.0–36.0)
MCV: 94.7 fl (ref 78.0–100.0)
Monocytes Absolute: 1.1 10*3/uL — ABNORMAL HIGH (ref 0.1–1.0)
Monocytes Relative: 13.1 % — ABNORMAL HIGH (ref 3.0–12.0)
NEUTROS PCT: 60.9 % (ref 43.0–77.0)
Neutro Abs: 5 10*3/uL (ref 1.4–7.7)
Platelets: 224 10*3/uL (ref 150.0–400.0)
RBC: 4.46 Mil/uL (ref 4.22–5.81)
RDW: 14.1 % (ref 11.5–15.5)
WBC: 8.2 10*3/uL (ref 4.0–10.5)

## 2015-01-04 LAB — BASIC METABOLIC PANEL
BUN: 11 mg/dL (ref 6–23)
CALCIUM: 9.3 mg/dL (ref 8.4–10.5)
CHLORIDE: 104 meq/L (ref 96–112)
CO2: 31 meq/L (ref 19–32)
CREATININE: 0.81 mg/dL (ref 0.40–1.50)
GFR: 96.26 mL/min (ref >60.00)
GLUCOSE: 68 mg/dL — AB (ref 70–99)
Potassium: 4.2 mEq/L (ref 3.5–5.1)
Sodium: 140 mEq/L (ref 135–145)

## 2015-01-04 LAB — HEPATIC FUNCTION PANEL
ALBUMIN: 4.1 g/dL (ref 3.5–5.2)
ALK PHOS: 77 U/L (ref 39–117)
ALT: 11 U/L (ref 0–53)
AST: 17 U/L (ref 0–37)
Bilirubin, Direct: 0.3 mg/dL (ref 0.0–0.3)
TOTAL PROTEIN: 6.9 g/dL (ref 6.0–8.3)
Total Bilirubin: 1.5 mg/dL — ABNORMAL HIGH (ref 0.2–1.2)

## 2015-01-04 LAB — URINALYSIS, ROUTINE W REFLEX MICROSCOPIC
BILIRUBIN URINE: NEGATIVE
Ketones, ur: NEGATIVE
Leukocytes, UA: NEGATIVE
NITRITE: NEGATIVE
Specific Gravity, Urine: 1.01 (ref 1.000–1.030)
Total Protein, Urine: NEGATIVE
Urine Glucose: NEGATIVE
Urobilinogen, UA: 0.2 (ref 0.0–1.0)
pH: 6.5 (ref 5.0–8.0)

## 2015-01-04 LAB — TSH: TSH: 1.41 u[IU]/mL (ref 0.35–4.50)

## 2015-01-04 LAB — PSA: PSA: 0.24 ng/mL (ref 0.10–4.00)

## 2015-01-04 NOTE — Assessment & Plan Note (Signed)
Metoprolol, Losartan HCT - not taking

## 2015-01-04 NOTE — Assessment & Plan Note (Signed)
Remote H/o XRT Testosterone Rx  is contraindicated

## 2015-01-04 NOTE — Assessment & Plan Note (Signed)
Metoprolol, Losartan HCT - not taking Discussed

## 2015-01-04 NOTE — Patient Instructions (Signed)
There are natural ways to boost your testosterone:  1. Lose Weight If you're overweight, shedding the excess pounds may increase your testosterone levels, according to multiple research. Overweight men are more likely to have low testosterone levels to begin with, so this is an important trick to increase your body's testosterone production when you need it most.   2. Strength Training    Strength training is also known to boost testosterone levels, provided you are doing so intensely enough. When strength training to boost testosterone, you'll want to increase the weight and lower your number of reps, and then focus on exercises that work a large number of muscles.  3. Optimize Your Vitamin D Levels Vitamin D, a steroid hormone, is essential for the healthy development of the nucleus of the sperm cell, and helps maintain semen quality and sperm count. Vitamin D also increases levels of testosterone, which may boost libido. In one study, overweight men who were given vitamin D supplements had a significant increase in testosterone levels after one year.  4. Reduce Stress When you're under a lot of stress, your body releases high levels of the stress hormone cortisol. This hormone actually blocks the effects of testosterone, presumably because, from a biological standpoint, testosterone-associated behaviors (mating, competing, aggression) may have lowered your chances of survival in an emergency (hence, the "fight or flight" response is dominant, courtesy of cortisol).  5. Limit or Eliminate Sugar from Your Diet Testosterone levels decrease after you eat sugar, which is likely because the sugar leads to a high insulin level, another factor leading to low testosterone.  6. Eat Healthy Fats By healthy, this means not only mon- and polyunsaturated fats, like that found in avocadoes and nuts, but also saturated, as these are essential for building testosterone. Research shows that a diet with less than  40 percent of energy as fat (and that mainly from animal sources, i.e. saturated) lead to a decrease in testosterone levels.  It's important to understand that your body requires saturated fats from animal and vegetable sources (such as meat, dairy, certain oils, and tropical plants like coconut) for optimal functioning, and if you neglect this important food group in favor of sugar, grains and other starchy carbs, your health and weight are almost guaranteed to suffer. Examples of healthy fats you can eat more of to give your testosterone levels a boost include:  Olives and Olive oil  Coconuts and coconut oil Butter made from organic milk  Raw nuts, such as, almonds or pecans Eggs Avocados   Meats Palm oil Unheated organic nut oils

## 2015-01-04 NOTE — Progress Notes (Signed)
Subjective:   HPI  F/u chronic A. Fib, anticoagulation, s/p AVR, OA. C/o fatigue: asking me to check testosterone, heavy metals  F/u LLE/knee pain, leg swelling - chronic  Wife of 36 years died 4 years ago   Pt stopped playing tennis due to OA, he stopped fishing, exercising Pt  lives in [place] during winter - 6 months/year  BP is ok at home  BP Readings from Last 3 Encounters:  01/04/15 150/90  04/30/14 152/88  03/10/14 182/90   Wt Readings from Last 3 Encounters:  01/04/15 184 lb (83.462 kg)  04/30/14 182 lb (82.555 kg)  03/10/14 182 lb 12.8 oz (82.918 kg)      Review of Systems  Constitutional: Negative for chills, appetite change, fatigue and unexpected weight change.  HENT: Negative for congestion, nosebleeds, sneezing, sore throat and trouble swallowing.   Eyes: Negative for itching and visual disturbance.  Respiratory: Negative for apnea, cough, shortness of breath and wheezing.   Cardiovascular: Positive for leg swelling. Negative for chest pain and palpitations.  Gastrointestinal: Negative for nausea, diarrhea, blood in stool and abdominal distention.  Genitourinary: Negative for frequency and hematuria.  Musculoskeletal: Positive for arthralgias and gait problem. Negative for back pain, joint swelling and neck pain.  Skin: Negative for rash and wound.  Allergic/Immunologic: Positive for environmental allergies.  Neurological: Negative for dizziness, tremors, speech difficulty, weakness, light-headedness and headaches.  Psychiatric/Behavioral: Negative for behavioral problems, confusion, sleep disturbance, dysphoric mood and agitation. The patient is not nervous/anxious.        Objective:   Physical Exam  Constitutional: He is oriented to person, place, and time. He appears well-developed and well-nourished. No distress.  NAD  HENT:  Mouth/Throat: Oropharynx is clear and moist.  Eyes: Conjunctivae are normal. Pupils are equal, round, and reactive to  light.  Neck: Normal range of motion. No JVD present. No thyromegaly present.  Cardiovascular: Normal rate, normal heart sounds and intact distal pulses.  Exam reveals no gallop and no friction rub.   No murmur heard. irreg irreg  Pulmonary/Chest: Effort normal and breath sounds normal. No respiratory distress. He has no wheezes. He has no rales. He exhibits no tenderness.  Abdominal: Soft. Bowel sounds are normal. He exhibits no distension and no mass. There is no tenderness. There is no rebound and no guarding.  Musculoskeletal: Normal range of motion. He exhibits edema (1+B). He exhibits no tenderness.  Lymphadenopathy:    He has no cervical adenopathy.  Neurological: He is alert and oriented to person, place, and time. He has normal reflexes. No cranial nerve deficit. He exhibits normal muscle tone. He displays a negative Romberg sign. Coordination and gait normal.  Skin: Skin is warm and dry. No rash noted. He is not diaphoretic.  Psychiatric: He has a normal mood and affect. His behavior is normal. Judgment and thought content normal.  Pt looks very young LR LE edema  Lab Results  Component Value Date   WBC 8.3 04/28/2014   HGB 13.7 04/28/2014   HCT 41.1 04/28/2014   PLT 219.0 04/28/2014   GLUCOSE 83 04/28/2014   CHOL 195 04/28/2014   TRIG 67.0 04/28/2014   HDL 62.00 04/28/2014   LDLCALC 120* 04/28/2014   ALT 20 04/28/2014   AST 27 04/28/2014   NA 139 04/28/2014   K 4.2 04/28/2014   CL 103 04/28/2014   CREATININE 0.8 04/28/2014   BUN 12 04/28/2014   CO2 30 04/28/2014   TSH 1.26 04/28/2014   PSA 0.20 04/28/2014  INR 2.7 04/28/2014       Assessment & Plan:

## 2015-01-04 NOTE — Assessment & Plan Note (Signed)
Chronic - better 

## 2015-01-04 NOTE — Assessment & Plan Note (Signed)
Pt was inquiring about testosterone treatment and chelation. I explained: Testosterone Rx  is contraindicated Pt can go to an alternative medicine practitioner for heavy metal tests and chelation

## 2015-01-04 NOTE — Assessment & Plan Note (Signed)
On coumadin 

## 2015-01-04 NOTE — Progress Notes (Signed)
Pre visit review using our clinic review tool, if applicable. No additional management support is needed unless otherwise documented below in the visit note. 

## 2015-01-04 NOTE — Assessment & Plan Note (Signed)
Compr socks 

## 2015-01-05 LAB — TESTOSTERONE, FREE, TOTAL, SHBG
Sex Hormone Binding: 75 nmol/L (ref 22–77)
Testosterone, Free: 53.9 pg/mL (ref 47.0–244.0)
Testosterone-% Free: 1.2 % — ABNORMAL LOW (ref 1.6–2.9)
Testosterone: 467 ng/dL (ref 300–890)

## 2015-01-12 ENCOUNTER — Ambulatory Visit (INDEPENDENT_AMBULATORY_CARE_PROVIDER_SITE_OTHER): Payer: Medicare Other | Admitting: *Deleted

## 2015-01-12 DIAGNOSIS — I482 Chronic atrial fibrillation, unspecified: Secondary | ICD-10-CM

## 2015-01-12 DIAGNOSIS — Z7901 Long term (current) use of anticoagulants: Secondary | ICD-10-CM

## 2015-01-12 DIAGNOSIS — Z952 Presence of prosthetic heart valve: Secondary | ICD-10-CM

## 2015-01-12 DIAGNOSIS — Z954 Presence of other heart-valve replacement: Secondary | ICD-10-CM | POA: Diagnosis not present

## 2015-01-12 DIAGNOSIS — I4891 Unspecified atrial fibrillation: Secondary | ICD-10-CM

## 2015-01-12 LAB — POCT INR: INR: 2.6

## 2015-01-25 ENCOUNTER — Encounter: Payer: Self-pay | Admitting: Cardiovascular Disease

## 2015-01-31 ENCOUNTER — Encounter: Payer: Self-pay | Admitting: Cardiovascular Disease

## 2015-01-31 ENCOUNTER — Other Ambulatory Visit: Payer: Self-pay

## 2015-02-09 ENCOUNTER — Ambulatory Visit (INDEPENDENT_AMBULATORY_CARE_PROVIDER_SITE_OTHER): Payer: Medicare Other | Admitting: *Deleted

## 2015-02-09 DIAGNOSIS — I4891 Unspecified atrial fibrillation: Secondary | ICD-10-CM | POA: Diagnosis not present

## 2015-02-09 DIAGNOSIS — Z954 Presence of other heart-valve replacement: Secondary | ICD-10-CM | POA: Diagnosis not present

## 2015-02-09 DIAGNOSIS — Z7901 Long term (current) use of anticoagulants: Secondary | ICD-10-CM | POA: Diagnosis not present

## 2015-02-09 DIAGNOSIS — Z952 Presence of prosthetic heart valve: Secondary | ICD-10-CM

## 2015-02-09 LAB — POCT INR: INR: 2

## 2015-02-12 ENCOUNTER — Other Ambulatory Visit: Payer: Self-pay | Admitting: Cardiovascular Disease

## 2015-03-02 ENCOUNTER — Encounter: Payer: Self-pay | Admitting: Cardiovascular Disease

## 2015-03-09 ENCOUNTER — Ambulatory Visit (INDEPENDENT_AMBULATORY_CARE_PROVIDER_SITE_OTHER): Payer: Medicare Other | Admitting: Pharmacist

## 2015-03-09 DIAGNOSIS — Z7901 Long term (current) use of anticoagulants: Secondary | ICD-10-CM

## 2015-03-09 DIAGNOSIS — Z954 Presence of other heart-valve replacement: Secondary | ICD-10-CM | POA: Diagnosis not present

## 2015-03-09 DIAGNOSIS — Z952 Presence of prosthetic heart valve: Secondary | ICD-10-CM

## 2015-03-09 DIAGNOSIS — I4891 Unspecified atrial fibrillation: Secondary | ICD-10-CM

## 2015-03-09 LAB — POCT INR: INR: 1.8

## 2015-03-14 DIAGNOSIS — I10 Essential (primary) hypertension: Secondary | ICD-10-CM | POA: Diagnosis not present

## 2015-03-14 DIAGNOSIS — R51 Headache: Secondary | ICD-10-CM | POA: Diagnosis not present

## 2015-03-14 DIAGNOSIS — J329 Chronic sinusitis, unspecified: Secondary | ICD-10-CM | POA: Diagnosis not present

## 2015-03-25 ENCOUNTER — Ambulatory Visit: Payer: PRIVATE HEALTH INSURANCE | Admitting: Cardiovascular Disease

## 2015-04-01 DIAGNOSIS — H2513 Age-related nuclear cataract, bilateral: Secondary | ICD-10-CM | POA: Diagnosis not present

## 2015-04-01 DIAGNOSIS — H524 Presbyopia: Secondary | ICD-10-CM | POA: Diagnosis not present

## 2015-04-04 NOTE — Progress Notes (Signed)
Patient ID: Jason Hawkins, male   DOB: 12-13-1927, 81 y.o.   MRN: [id number]   Joseth is seen today for F/U Still not same since wife died  They were married for over 73 years and she was a patient of ours as well. Kerrick has a history of AVR and coumadin and afib. He has some dementia. He has not had any TIA's or bleeding complications although he forgets his coumadin from time to time. He denies palpitations, SSCP, dyspnea or syncope. He broke his left tibia in his 27" and has mild chronic LE edema.  He has let his hair grow long [name] use to cut it and he will not cut it again Needs coumadin refill  Spending a lot of time in [place].  [place] Unfortunately does not check his coumadin down there    ROS: Denies fever, malais, weight loss, blurry vision, decreased visual acuity, cough, sputum, SOB, hemoptysis, pleuritic pain, palpitaitons, heartburn, abdominal pain, melena, lower extremity edema, claudication, or rash.  All other systems reviewed and negative  General: Affect appropriate Healthy:  appears stated age 31: normal Neck supple with no adenopathy JVP normal no bruits no thyromegaly Lungs clear with no wheezing and good diaphragmatic motion Heart:  S1/S2 SEM  murmur, no rub, gallop or click PMI normal Abdomen: benighn, BS positve, no tenderness, no AAA no bruit.  No HSM or HJR Distal pulses intact with no bruits Plus 2 bilateral  Edema with stasis  Neuro non-focal Skin warm and dry No muscular weakness   Current Outpatient Prescriptions  Medication Sig Dispense Refill  . co-enzyme Q-10 50 MG capsule Take 50 mg by mouth daily.    . diphenhydrAMINE (BENADRYL) 25 MG tablet Take 2 tablets (50 mg total) by mouth every 6 (six) hours as needed for itching or allergies (bee sting). 30 tablet 0  . furosemide (LASIX) 20 MG tablet Take 1 tablet (20 mg total) by mouth daily. 90 tablet 3  . GLUCOSAMINE-CHONDROITIN PO Take 1 tablet by mouth daily.      [name] Hyaluronic Acid 20-60 MG  CAPS Take 1 tablet by mouth daily.      [name] losartan-hydrochlorothiazide (HYZAAR) 50-12.5 MG per tablet Take 1 tablet by mouth daily. 90 tablet 1  . metoprolol succinate (TOPROL-XL) 25 MG 24 hr tablet TAKE 1 TABLET DAILY 90 tablet 1  . Multiple Vitamin (MULTIVITAMIN) capsule Take 1 capsule by mouth daily.      . pseudoephedrine (SUDAFED) 30 MG tablet Take 2 tablets (60 mg total) by mouth every 4 (four) hours as needed for congestion (bee sting). 60 tablet 1  . Red Yeast Rice 600 MG CAPS Take 1 capsule by mouth daily.      . TURMERIC PO Take 1 tablet by mouth daily.      [name] warfarin (COUMADIN) 5 MG tablet Take 1 tablet (5 mg total) by mouth as directed. 10 tablet 0  . warfarin (COUMADIN) 5 MG tablet TAKE AS DIRECTED BY COUMADIN CLINIC 90 tablet 0  . [DISCONTINUED] testosterone (ANDRODERM) 2.5 MG/24HR Place 1 patch onto the skin daily. 90 patch 1   No current facility-administered medications for this visit.    Allergies  Bee venom and Codeine  Electrocardiogram:    04/28/14  afib rate 52  LAD  04/05/15  afib rate 59  Otherwise normal  Assessment and Plan AVR: normal valve clicks SBE  No need for f/u echo Edema  Venous disease stable continue diuretic Anticoagulation will check INR today no bleeding issues Chol:  Lab Results  Component Value Date   LDLCALC 120* 04/28/2014

## 2015-04-05 ENCOUNTER — Ambulatory Visit (INDEPENDENT_AMBULATORY_CARE_PROVIDER_SITE_OTHER): Payer: Medicare Other | Admitting: Pharmacist

## 2015-04-05 ENCOUNTER — Ambulatory Visit (INDEPENDENT_AMBULATORY_CARE_PROVIDER_SITE_OTHER): Payer: Medicare Other | Admitting: Cardiovascular Disease

## 2015-04-05 ENCOUNTER — Encounter: Payer: Self-pay | Admitting: Cardiovascular Disease

## 2015-04-05 VITALS — BP 132/62 | HR 59 | Ht 69.0 in | Wt 181.2 lb

## 2015-04-05 DIAGNOSIS — I4891 Unspecified atrial fibrillation: Secondary | ICD-10-CM | POA: Diagnosis not present

## 2015-04-05 DIAGNOSIS — Z954 Presence of other heart-valve replacement: Secondary | ICD-10-CM

## 2015-04-05 DIAGNOSIS — Z7901 Long term (current) use of anticoagulants: Secondary | ICD-10-CM

## 2015-04-05 DIAGNOSIS — Z952 Presence of prosthetic heart valve: Secondary | ICD-10-CM

## 2015-04-05 LAB — POCT INR: INR: 2

## 2015-04-05 NOTE — Patient Instructions (Signed)
Medication Instructions:  NO CHANGES  Labwork: NONE  Testing/Procedures: NONE  Follow-Up: Your physician wants you to follow-up in: YEAR WITH  DR NISHAN You will receive a reminder letter in the mail two months in advance. If you don't receive a letter, please call our office to schedule the follow-up appointment.   Any Other Special Instructions Will Be Listed Below (If Applicable).   

## 2015-04-27 DIAGNOSIS — R413 Other amnesia: Secondary | ICD-10-CM | POA: Diagnosis not present

## 2015-04-27 DIAGNOSIS — J329 Chronic sinusitis, unspecified: Secondary | ICD-10-CM | POA: Diagnosis not present

## 2015-04-27 DIAGNOSIS — Z952 Presence of prosthetic heart valve: Secondary | ICD-10-CM | POA: Diagnosis not present

## 2015-04-27 DIAGNOSIS — R51 Headache: Secondary | ICD-10-CM | POA: Diagnosis not present

## 2015-05-03 ENCOUNTER — Ambulatory Visit (INDEPENDENT_AMBULATORY_CARE_PROVIDER_SITE_OTHER): Payer: Medicare Other | Admitting: Pharmacist

## 2015-05-03 DIAGNOSIS — Z954 Presence of other heart-valve replacement: Secondary | ICD-10-CM | POA: Diagnosis not present

## 2015-05-03 DIAGNOSIS — Z7901 Long term (current) use of anticoagulants: Secondary | ICD-10-CM

## 2015-05-03 DIAGNOSIS — Z952 Presence of prosthetic heart valve: Secondary | ICD-10-CM

## 2015-05-03 DIAGNOSIS — I4891 Unspecified atrial fibrillation: Secondary | ICD-10-CM

## 2015-05-03 LAB — POCT INR: INR: 1.8

## 2015-06-22 ENCOUNTER — Other Ambulatory Visit: Payer: Self-pay | Admitting: Cardiovascular Disease

## 2015-06-22 ENCOUNTER — Telehealth: Payer: Self-pay | Admitting: *Deleted

## 2015-06-22 NOTE — Telephone Encounter (Signed)
Called the patient to schedule an appt in the CV[facility] clinic since he is overdue for follow-up.  Spoke with the patient and he stated that he was not returning to [place] until 6 months later because he is in [place].  Advised that he needed his INR done & Coumadin dosed; he stated that he will go to Dr. Clarise Cruz office in [facility] to have it done. Advised that we would prefer to have it done this week. He stated he would attend an appt to have it checked.  I called Dr. Clarise Cruz office to obtain a fax # & she stated they would be able to see him tomorrow at 115p.  They scheduled the appt and an order was faxed over to obtain a PT/INR, too.  Returned a call to the pt and left a message as requested per patient.

## 2015-06-23 ENCOUNTER — Telehealth: Payer: Self-pay | Admitting: *Deleted

## 2015-06-23 NOTE — Telephone Encounter (Signed)
Spoke with patient on 06/22/15 and he asked that I send an order for him to get his INR checked in [place] at Dr. Miquel Dunn office. I faxed an order to their office and was able to set an appt for the patient to be seen on 06/23/15 at 115pm.  I called the patient to remind him of his appt in their office and he informed me that he has decided not to go to the appt.  I stressed the importance of attending because his INR needed to be followed and his Coumadin needs to be dosed.  He stated that he is willing to take the chance of not having his INR checked for 6 months.  I informed him that if he does not take his Coumadin he is at risk for a stroke, blood clot, and have fatal results and that if he continues to take the coumadin without having his INR checked he may experience bleeding.  He stated that he is willing to take the 50/50 chance of any outcome.  Advised that it would be beneficial to have his INR checked to prevent any of the risks from occuring.  He stated that if has had seconds thoughts he would set up an appt in [place].  Again, I reinterated the risks and he stated he knows and will call if he thought he needed any assistance.  Also, called Dr. Miquel Dunn office to cancel the appt as requested by the patient.

## 2015-06-24 ENCOUNTER — Other Ambulatory Visit: Payer: Self-pay | Admitting: Cardiovascular Disease

## 2015-06-27 ENCOUNTER — Telehealth: Payer: Self-pay | Admitting: Cardiovascular Disease

## 2015-06-27 MED ORDER — WARFARIN SODIUM 5 MG PO TABS: 5.0000 mg | ORAL_TABLET | ORAL | Status: DC

## 2015-06-27 NOTE — Telephone Encounter (Signed)
PER  DR NISHAN OKAY TO REFILL   COUMADIN   REFILL SENT   IN  AS  REQUESTED ./CY

## 2015-06-27 NOTE — Telephone Encounter (Signed)
New message      Pt request to talk to Jason Hawkins.  He would not tell me what he wanted

## 2015-06-28 MED ORDER — WARFARIN SODIUM 5 MG PO TABS
5.0000 mg | ORAL_TABLET | ORAL | Status: DC
Start: 2015-06-28 — End: 2016-01-27

## 2015-07-04 ENCOUNTER — Encounter: Payer: Self-pay | Admitting: Cardiovascular Disease

## 2015-12-20 ENCOUNTER — Encounter: Payer: Self-pay | Admitting: Cardiovascular Disease

## 2015-12-26 ENCOUNTER — Encounter: Payer: Self-pay | Admitting: Internal Medicine

## 2015-12-28 ENCOUNTER — Ambulatory Visit (INDEPENDENT_AMBULATORY_CARE_PROVIDER_SITE_OTHER): Payer: Medicare Other | Admitting: Surgery

## 2015-12-28 DIAGNOSIS — Z952 Presence of prosthetic heart valve: Secondary | ICD-10-CM

## 2015-12-28 DIAGNOSIS — Z954 Presence of other heart-valve replacement: Secondary | ICD-10-CM | POA: Diagnosis not present

## 2015-12-28 DIAGNOSIS — Z7901 Long term (current) use of anticoagulants: Secondary | ICD-10-CM

## 2015-12-28 DIAGNOSIS — I4891 Unspecified atrial fibrillation: Secondary | ICD-10-CM | POA: Diagnosis not present

## 2015-12-28 LAB — POCT INR: INR: 2.6

## 2015-12-29 ENCOUNTER — Telehealth: Payer: Self-pay

## 2015-12-29 NOTE — Telephone Encounter (Signed)
Called patient, and made an appointment for tomorrow at 10:30. Patient agreed to plan.

## 2015-12-29 NOTE — Telephone Encounter (Signed)
-----   Message from Josue Hector, MD sent at 12/28/2015  5:11 PM EDT ----- He has not been the same since his wife died.  Will have my nurse call him and try to see if.   ----- Message -----    From: Thurnell Garbe, RN    Sent: 12/28/2015  12:17 PM      To: Josue Hector, MD  Dr. Johnsie Cancel,      I saw Jason Hawkins in the coumadin clinic today.  He had been in [place] since October and had been dosing his Coumadin himself using a friend's machine.  Also, he stopped taking his Lasix, Metoprolol, and Hyzaar, and is instead taking L-citralline and L-arginine.  He could not get an appointment with you before he left for [place] and has not made one since he returned.  I wanted to let you know about all these changes he has made.  Thanks, [employer]

## 2015-12-30 ENCOUNTER — Encounter: Payer: Self-pay | Admitting: Cardiovascular Disease

## 2015-12-30 ENCOUNTER — Ambulatory Visit: Payer: Self-pay | Admitting: Internal Medicine

## 2015-12-30 ENCOUNTER — Ambulatory Visit (INDEPENDENT_AMBULATORY_CARE_PROVIDER_SITE_OTHER): Payer: Medicare Other | Admitting: Cardiovascular Disease

## 2015-12-30 VITALS — BP 148/80 | HR 80 | Ht 70.0 in | Wt 186.0 lb

## 2015-12-30 DIAGNOSIS — Z954 Presence of other heart-valve replacement: Secondary | ICD-10-CM

## 2015-12-30 DIAGNOSIS — Z952 Presence of prosthetic heart valve: Secondary | ICD-10-CM

## 2015-12-30 NOTE — Progress Notes (Signed)
Patient ID: Jason Hawkins, male   DOB: 1928/02/18, 82 y.o.   MRN: [id number]   Jason Hawkins is seen today for F/U Still not same since wife died  They were married for over 7 years and she was a patient of ours as well. [name] has a history of AVR and coumadin and afib. He has some dementia. He has not had any TIA's or bleeding complications although he forgets his coumadin from time to time. He denies palpitations, SSCP, dyspnea or syncope. He broke his left tibia in his 44" and has mild chronic LE edema.  He has let his hair grow long Jason Hawkins use to cut it and he will not cut it again Needs coumadin refill  Spending a lot of time in [place].  [place] Unfortunately does not check his coumadin down there regularly Uses next door neighbors machine at times  Wife Jason Hawkins passed 5 years ago and Jason Hawkins has let himself go since then. Not taking meds regularly and drinking beer a lot  He gets aggravated by our office staff and I gave him my cell phone number to call directly for refills and appts  ROS: Denies fever, malais, weight loss, blurry vision, decreased visual acuity, cough, sputum, SOB, hemoptysis, pleuritic pain, palpitaitons, heartburn, abdominal pain, melena, lower extremity edema, claudication, or rash.  All other systems reviewed and negative  General: Affect appropriate Healthy:  appears stated age 49: normal Neck supple with no adenopathy JVP normal no bruits no thyromegaly Lungs clear with no wheezing and good diaphragmatic motion Heart:  S1/S2 SEM  murmur, no rub, gallop or click PMI normal Abdomen: benighn, BS positve, no tenderness, no AAA no bruit.  No HSM or HJR Distal pulses intact with no bruits Plus 2 bilateral  Edema with stasis  Neuro non-focal Skin warm and dry No muscular weakness   Current Outpatient Prescriptions  Medication Sig Dispense Refill  . co-enzyme Q-10 50 MG capsule Take 50 mg by mouth daily.    . diphenhydrAMINE (BENADRYL) 25 MG tablet Take 2 tablets  (50 mg total) by mouth every 6 (six) hours as needed for itching or allergies (bee sting). 30 tablet 0  . EPINEPHrine 0.3 mg/0.3 mL IJ SOAJ injection Inject 0.3 mg into the muscle once.    [name] GLUCOSAMINE-CHONDROITIN PO Take 1 tablet by mouth daily.      [name] Hyaluronic Acid 20-60 MG CAPS Take 1 tablet by mouth daily.      . L-ARGININE PO Take 1 capsule by mouth 4 (four) times a week.    . L-CITRULLINE PO Take 1 capsule by mouth 4 (four) times a week.    . Multiple Vitamin (MULTIVITAMIN) capsule Take 1 capsule by mouth daily.      [name] OVER THE COUNTER MEDICATION Take 1 Dose by mouth 4 (four) times a week. Helps with Testerone.    . Red Yeast Rice 600 MG CAPS Take 1 capsule by mouth daily. Reported on 12/28/2015    . TURMERIC PO Take 1 tablet by mouth daily.      [name] warfarin (COUMADIN) 5 MG tablet Take 1 tablet (5 mg total) by mouth as directed. 60 tablet 11  . warfarin (COUMADIN) 5 MG tablet Take 1 tablet (5 mg total) by mouth as directed. 60 tablet 3  . [DISCONTINUED] testosterone (ANDRODERM) 2.5 MG/24HR Place 1 patch onto the skin daily. 90 patch 1   No current facility-administered medications for this visit.    Allergies  Bee venom and Codeine  Electrocardiogram:  04/28/14  afib rate 52  LAD  04/05/15  afib rate 59  Otherwise normal  Assessment and Plan AVR: normal valve clicks SBE  No need for f/u echo Edema  Venous disease stable continue diuretic Anticoagulation Rx 2 days ago no bleeding issues  Lab Results  Component Value Date   INR 2.6 12/28/2015   INR 1.8 05/03/2015   INR 2.0 04/05/2015   PROTIME 18 01/06/2009    Chol:  Lab Results  Component Value Date   LDLCALC 120* 04/28/2014     Jenkins Rouge

## 2015-12-30 NOTE — Patient Instructions (Signed)

## 2016-01-03 ENCOUNTER — Encounter: Payer: PRIVATE HEALTH INSURANCE | Admitting: Internal Medicine

## 2016-01-03 DIAGNOSIS — H524 Presbyopia: Secondary | ICD-10-CM | POA: Diagnosis not present

## 2016-01-03 DIAGNOSIS — H25813 Combined forms of age-related cataract, bilateral: Secondary | ICD-10-CM | POA: Diagnosis not present

## 2016-01-04 ENCOUNTER — Encounter: Payer: PRIVATE HEALTH INSURANCE | Admitting: Internal Medicine

## 2016-01-04 ENCOUNTER — Ambulatory Visit (INDEPENDENT_AMBULATORY_CARE_PROVIDER_SITE_OTHER): Payer: Medicare Other | Admitting: Internal Medicine

## 2016-01-04 ENCOUNTER — Other Ambulatory Visit (INDEPENDENT_AMBULATORY_CARE_PROVIDER_SITE_OTHER): Payer: Medicare Other

## 2016-01-04 ENCOUNTER — Encounter: Payer: Self-pay | Admitting: Internal Medicine

## 2016-01-04 VITALS — BP 139/84 | HR 61 | Ht 69.0 in | Wt 186.0 lb

## 2016-01-04 DIAGNOSIS — F32A Depression, unspecified: Secondary | ICD-10-CM

## 2016-01-04 DIAGNOSIS — E785 Hyperlipidemia, unspecified: Secondary | ICD-10-CM

## 2016-01-04 DIAGNOSIS — I4891 Unspecified atrial fibrillation: Secondary | ICD-10-CM

## 2016-01-04 DIAGNOSIS — Z0000 Encounter for general adult medical examination without abnormal findings: Secondary | ICD-10-CM

## 2016-01-04 DIAGNOSIS — R030 Elevated blood-pressure reading, without diagnosis of hypertension: Secondary | ICD-10-CM | POA: Diagnosis not present

## 2016-01-04 DIAGNOSIS — Z8546 Personal history of malignant neoplasm of prostate: Secondary | ICD-10-CM

## 2016-01-04 DIAGNOSIS — F329 Major depressive disorder, single episode, unspecified: Secondary | ICD-10-CM

## 2016-01-04 DIAGNOSIS — IMO0001 Reserved for inherently not codable concepts without codable children: Secondary | ICD-10-CM

## 2016-01-04 DIAGNOSIS — Z7189 Other specified counseling: Secondary | ICD-10-CM | POA: Insufficient documentation

## 2016-01-04 LAB — LIPID PANEL
CHOL/HDL RATIO: 4
Cholesterol: 214 mg/dL — ABNORMAL HIGH (ref 0–200)
HDL: 60.5 mg/dL (ref >39.00)
LDL Cholesterol: 134 mg/dL — ABNORMAL HIGH (ref 0–99)
NONHDL: 153.07
TRIGLYCERIDES: 93 mg/dL (ref 0.0–149.0)
VLDL: 18.6 mg/dL (ref 0.0–40.0)

## 2016-01-04 LAB — CBC WITH DIFFERENTIAL/PLATELET
BASOS ABS: 0 10*3/uL (ref 0.0–0.1)
Basophils Relative: 0.5 % (ref 0.0–3.0)
EOS ABS: 0.2 10*3/uL (ref 0.0–0.7)
Eosinophils Relative: 3.2 % (ref 0.0–5.0)
HEMATOCRIT: 42.9 % (ref 39.0–52.0)
HEMOGLOBIN: 14.6 g/dL (ref 13.0–17.0)
LYMPHS PCT: 29.6 % (ref 12.0–46.0)
Lymphs Abs: 2.2 10*3/uL (ref 0.7–4.0)
MCHC: 34.1 g/dL (ref 30.0–36.0)
MCV: 93.3 fl (ref 78.0–100.0)
MONOS PCT: 13.3 % — AB (ref 3.0–12.0)
Monocytes Absolute: 1 10*3/uL (ref 0.1–1.0)
NEUTROS ABS: 4 10*3/uL (ref 1.4–7.7)
Neutrophils Relative %: 53.4 % (ref 43.0–77.0)
PLATELETS: 206 10*3/uL (ref 150.0–400.0)
RBC: 4.6 Mil/uL (ref 4.22–5.81)
RDW: 14.3 % (ref 11.5–15.5)
WBC: 7.6 10*3/uL (ref 4.0–10.5)

## 2016-01-04 LAB — URINALYSIS, ROUTINE W REFLEX MICROSCOPIC
Bilirubin Urine: NEGATIVE
Ketones, ur: NEGATIVE
Leukocytes, UA: NEGATIVE
NITRITE: NEGATIVE
Specific Gravity, Urine: 1.015 (ref 1.000–1.030)
TOTAL PROTEIN, URINE-UPE24: NEGATIVE
Urine Glucose: NEGATIVE
Urobilinogen, UA: 0.2 (ref 0.0–1.0)
pH: 6 (ref 5.0–8.0)

## 2016-01-04 LAB — BASIC METABOLIC PANEL
BUN: 14 mg/dL (ref 6–23)
CALCIUM: 9.3 mg/dL (ref 8.4–10.5)
CO2: 30 mEq/L (ref 19–32)
CREATININE: 0.84 mg/dL (ref 0.40–1.50)
Chloride: 105 mEq/L (ref 96–112)
GFR: 92.08 mL/min (ref >60.00)
Glucose, Bld: 79 mg/dL (ref 70–99)
Potassium: 4 mEq/L (ref 3.5–5.1)
SODIUM: 141 meq/L (ref 135–145)

## 2016-01-04 LAB — HEPATIC FUNCTION PANEL
ALBUMIN: 4.3 g/dL (ref 3.5–5.2)
ALK PHOS: 65 U/L (ref 39–117)
ALT: 12 U/L (ref 0–53)
AST: 15 U/L (ref 0–37)
BILIRUBIN DIRECT: 0.3 mg/dL (ref 0.0–0.3)
Total Bilirubin: 1.8 mg/dL — ABNORMAL HIGH (ref 0.2–1.2)
Total Protein: 6.7 g/dL (ref 6.0–8.3)

## 2016-01-04 LAB — TSH: TSH: 1.52 u[IU]/mL (ref 0.35–4.50)

## 2016-01-04 LAB — PSA: PSA: 0.22 ng/mL (ref 0.10–4.00)

## 2016-01-04 NOTE — Assessment & Plan Note (Signed)
PSA

## 2016-01-04 NOTE — Assessment & Plan Note (Signed)

## 2016-01-04 NOTE — Progress Notes (Signed)
Subjective:  Patient ID: Jason Hawkins, male    DOB: [birth year]  Age: 82 y.o. MRN: [id number]  CC: No chief complaint on file.   HPI Michaelpaul Folker presents for a well exam. He gets chelation in [place] and takes EDTA in [place]. He hardly cooks any and eats frozen food and canned foods; takes supplements. BP was high this am  Outpatient Prescriptions Prior to Visit  Medication Sig Dispense Refill  . co-enzyme Q-10 50 MG capsule Take 50 mg by mouth daily.    . diphenhydrAMINE (BENADRYL) 25 MG tablet Take 2 tablets (50 mg total) by mouth every 6 (six) hours as needed for itching or allergies (bee sting). 30 tablet 0  . EPINEPHrine 0.3 mg/0.3 mL IJ SOAJ injection Inject 0.3 mg into the muscle once.    [name] GLUCOSAMINE-CHONDROITIN PO Take 1 tablet by mouth daily.      [name] Hyaluronic Acid 20-60 MG CAPS Take 1 tablet by mouth daily.      . L-ARGININE PO Take 1 capsule by mouth 4 (four) times a week.    . L-CITRULLINE PO Take 1 capsule by mouth 4 (four) times a week.    . Multiple Vitamin (MULTIVITAMIN) capsule Take 1 capsule by mouth daily.      [name] OVER THE COUNTER MEDICATION Take 1 Dose by mouth 4 (four) times a week. Helps with Testerone.    . Red Yeast Rice 600 MG CAPS Take 1 capsule by mouth daily. Reported on 12/28/2015    . TURMERIC PO Take 1 tablet by mouth daily.      [name] warfarin (COUMADIN) 5 MG tablet Take 1 tablet (5 mg total) by mouth as directed. (Patient taking differently: Take 5 mg by mouth as directed. 5 mg on M, W, F and 2.5 mg on other days) 60 tablet 11  . warfarin (COUMADIN) 5 MG tablet Take 1 tablet (5 mg total) by mouth as directed. (Patient not taking: Reported on 01/04/2016) 60 tablet 3   No facility-administered medications prior to visit.    ROS Review of Systems  Constitutional: Positive for fatigue. Negative for appetite change and unexpected weight change.  HENT: Negative for congestion, nosebleeds, sneezing, sore throat and trouble swallowing.   Eyes: Negative for  itching and visual disturbance.  Respiratory: Negative for cough.   Cardiovascular: Negative for chest pain, palpitations and leg swelling.  Gastrointestinal: Negative for nausea, diarrhea, blood in stool and abdominal distention.  Genitourinary: Negative for frequency and hematuria.  Musculoskeletal: Positive for arthralgias. Negative for back pain, joint swelling, gait problem and neck pain.  Skin: Negative for rash.  Neurological: Negative for dizziness, tremors, speech difficulty and weakness.  Psychiatric/Behavioral: Negative for suicidal ideas, sleep disturbance, dysphoric mood and agitation. The patient is not nervous/anxious.     Objective:  BP 180/88 mmHg  Pulse 61  Ht 5\' 9"  (1.753 m)  Wt 186 lb (84.369 kg)  BMI 27.45 kg/m2  SpO2 97%  BP Readings from Last 3 Encounters:  01/04/16 180/88  12/30/15 148/80  04/05/15 132/62    Wt Readings from Last 3 Encounters:  01/04/16 186 lb (84.369 kg)  12/30/15 186 lb (84.369 kg)  04/05/15 181 lb 4 oz (82.214 kg)    Physical Exam  Constitutional: He is oriented to person, place, and time. He appears well-developed. No distress.  NAD  HENT:  Mouth/Throat: Oropharynx is clear and moist.  Eyes: Conjunctivae are normal. Pupils are equal, round, and reactive to light.  Neck: Normal range of motion. No JVD  present. No thyromegaly present.  Cardiovascular: Normal rate, regular rhythm, normal heart sounds and intact distal pulses.  Exam reveals no gallop and no friction rub.   No murmur heard. Pulmonary/Chest: Effort normal and breath sounds normal. No respiratory distress. He has no wheezes. He has no rales. He exhibits no tenderness.  Abdominal: Soft. Bowel sounds are normal. He exhibits no distension and no mass. There is no tenderness. There is no rebound and no guarding.  Musculoskeletal: Normal range of motion. He exhibits no edema or tenderness.  Lymphadenopathy:    He has no cervical adenopathy.  Neurological: He is alert and  oriented to person, place, and time. He has normal reflexes. No cranial nerve deficit. He exhibits normal muscle tone. He displays a negative Romberg sign. Coordination and gait normal.  Skin: Skin is warm and dry. No rash noted.  Psychiatric: He has a normal mood and affect. His behavior is normal. Judgment and thought content normal.  Pt declined rectal  Lab Results  Component Value Date   WBC 8.2 01/04/2015   HGB 14.3 01/04/2015   HCT 42.2 01/04/2015   PLT 224.0 01/04/2015   GLUCOSE 68* 01/04/2015   CHOL 195 04/28/2014   TRIG 67.0 04/28/2014   HDL 62.00 04/28/2014   LDLCALC 120* 04/28/2014   ALT 11 01/04/2015   AST 17 01/04/2015   NA 140 01/04/2015   K 4.2 01/04/2015   CL 104 01/04/2015   CREATININE 0.81 01/04/2015   BUN 11 01/04/2015   CO2 31 01/04/2015   TSH 1.41 01/04/2015   PSA 0.24 01/04/2015   INR 2.6 12/28/2015    No results found.  Assessment & Plan:   There are no diagnoses linked to this encounter. I am having Mr. Godown maintain his GLUCOSAMINE-CHONDROITIN PO, Hyaluronic Acid, multivitamin, Red Yeast Rice, TURMERIC PO, diphenhydrAMINE, co-enzyme Q-10, warfarin, EPINEPHrine, L-ARGININE PO, L-CITRULLINE PO, and OVER THE COUNTER MEDICATION.  No orders of the defined types were placed in this encounter.     Follow-up: No Follow-up on file.  Walker Kehr, MD

## 2016-01-04 NOTE — Assessment & Plan Note (Signed)
Coumadin 

## 2016-01-04 NOTE — Progress Notes (Signed)
Pre visit review using our clinic review tool, if applicable. No additional management support is needed unless otherwise documented below in the visit note. 

## 2016-01-04 NOTE — Assessment & Plan Note (Signed)
Discussed.

## 2016-01-04 NOTE — Patient Instructions (Signed)

## 2016-01-04 NOTE — Assessment & Plan Note (Signed)
BP Readings from Last 3 Encounters:  01/04/16 180/88  12/30/15 148/80  04/05/15 132/62

## 2016-01-10 ENCOUNTER — Encounter: Payer: Self-pay | Admitting: Internal Medicine

## 2016-01-25 ENCOUNTER — Encounter: Payer: Self-pay | Admitting: Internal Medicine

## 2016-01-25 ENCOUNTER — Encounter: Payer: Self-pay | Admitting: Cardiovascular Disease

## 2016-01-27 MED ORDER — WARFARIN SODIUM 5 MG PO TABS: 5.0000 mg | ORAL_TABLET | ORAL | Status: DC

## 2016-01-31 ENCOUNTER — Emergency Department (HOSPITAL_COMMUNITY): Payer: Medicare Other

## 2016-01-31 ENCOUNTER — Inpatient Hospital Stay (HOSPITAL_COMMUNITY)
Admission: EM | Admit: 2016-01-31 | Discharge: 2016-02-13 | DRG: 020 | Disposition: A | Discharge status: Rehabilitation Facility | Payer: Medicare Other | Attending: Neurosurgery | Admitting: Neurosurgery

## 2016-01-31 ENCOUNTER — Encounter (HOSPITAL_COMMUNITY): Payer: Self-pay | Admitting: Adult Health

## 2016-01-31 DIAGNOSIS — R0602 Shortness of breath: Secondary | ICD-10-CM

## 2016-01-31 DIAGNOSIS — R739 Hyperglycemia, unspecified: Secondary | ICD-10-CM | POA: Diagnosis not present

## 2016-01-31 DIAGNOSIS — Z7901 Long term (current) use of anticoagulants: Secondary | ICD-10-CM

## 2016-01-31 DIAGNOSIS — Z955 Presence of coronary angioplasty implant and graft: Secondary | ICD-10-CM | POA: Diagnosis not present

## 2016-01-31 DIAGNOSIS — R600 Localized edema: Secondary | ICD-10-CM | POA: Diagnosis present

## 2016-01-31 DIAGNOSIS — I671 Cerebral aneurysm, nonruptured: Secondary | ICD-10-CM | POA: Diagnosis not present

## 2016-01-31 DIAGNOSIS — I959 Hypotension, unspecified: Secondary | ICD-10-CM | POA: Diagnosis not present

## 2016-01-31 DIAGNOSIS — H53149 Visual discomfort, unspecified: Secondary | ICD-10-CM | POA: Diagnosis present

## 2016-01-31 DIAGNOSIS — I110 Hypertensive heart disease with heart failure: Secondary | ICD-10-CM | POA: Diagnosis present

## 2016-01-31 DIAGNOSIS — Z8249 Family history of ischemic heart disease and other diseases of the circulatory system: Secondary | ICD-10-CM

## 2016-01-31 DIAGNOSIS — R4189 Other symptoms and signs involving cognitive functions and awareness: Secondary | ICD-10-CM | POA: Diagnosis not present

## 2016-01-31 DIAGNOSIS — I607 Nontraumatic subarachnoid hemorrhage from unspecified intracranial artery: Secondary | ICD-10-CM

## 2016-01-31 DIAGNOSIS — G4489 Other headache syndrome: Secondary | ICD-10-CM | POA: Diagnosis not present

## 2016-01-31 DIAGNOSIS — R4589 Other symptoms and signs involving emotional state: Secondary | ICD-10-CM | POA: Diagnosis not present

## 2016-01-31 DIAGNOSIS — E46 Unspecified protein-calorie malnutrition: Secondary | ICD-10-CM | POA: Diagnosis not present

## 2016-01-31 DIAGNOSIS — I609 Nontraumatic subarachnoid hemorrhage, unspecified: Secondary | ICD-10-CM | POA: Diagnosis not present

## 2016-01-31 DIAGNOSIS — R0600 Dyspnea, unspecified: Secondary | ICD-10-CM

## 2016-01-31 DIAGNOSIS — I482 Chronic atrial fibrillation, unspecified: Secondary | ICD-10-CM | POA: Insufficient documentation

## 2016-01-31 DIAGNOSIS — I5031 Acute diastolic (congestive) heart failure: Secondary | ICD-10-CM | POA: Diagnosis not present

## 2016-01-31 DIAGNOSIS — J9600 Acute respiratory failure, unspecified whether with hypoxia or hypercapnia: Secondary | ICD-10-CM

## 2016-01-31 DIAGNOSIS — D72829 Elevated white blood cell count, unspecified: Secondary | ICD-10-CM | POA: Insufficient documentation

## 2016-01-31 DIAGNOSIS — Z952 Presence of prosthetic heart valve: Secondary | ICD-10-CM

## 2016-01-31 DIAGNOSIS — R2681 Unsteadiness on feet: Secondary | ICD-10-CM | POA: Diagnosis present

## 2016-01-31 DIAGNOSIS — N390 Urinary tract infection, site not specified: Secondary | ICD-10-CM | POA: Diagnosis not present

## 2016-01-31 DIAGNOSIS — Z6827 Body mass index (BMI) 27.0-27.9, adult: Secondary | ICD-10-CM

## 2016-01-31 DIAGNOSIS — R110 Nausea: Secondary | ICD-10-CM | POA: Diagnosis not present

## 2016-01-31 DIAGNOSIS — Z954 Presence of other heart-valve replacement: Secondary | ICD-10-CM | POA: Diagnosis not present

## 2016-01-31 DIAGNOSIS — I10 Essential (primary) hypertension: Secondary | ICD-10-CM | POA: Insufficient documentation

## 2016-01-31 DIAGNOSIS — J95821 Acute postprocedural respiratory failure: Secondary | ICD-10-CM | POA: Diagnosis not present

## 2016-01-31 DIAGNOSIS — R0609 Other forms of dyspnea: Secondary | ICD-10-CM | POA: Diagnosis not present

## 2016-01-31 DIAGNOSIS — R269 Unspecified abnormalities of gait and mobility: Secondary | ICD-10-CM | POA: Diagnosis not present

## 2016-01-31 DIAGNOSIS — Z953 Presence of xenogenic heart valve: Secondary | ICD-10-CM | POA: Diagnosis not present

## 2016-01-31 DIAGNOSIS — E871 Hypo-osmolality and hyponatremia: Secondary | ICD-10-CM | POA: Diagnosis not present

## 2016-01-31 DIAGNOSIS — J9601 Acute respiratory failure with hypoxia: Secondary | ICD-10-CM | POA: Diagnosis not present

## 2016-01-31 DIAGNOSIS — G441 Vascular headache, not elsewhere classified: Secondary | ICD-10-CM | POA: Diagnosis present

## 2016-01-31 DIAGNOSIS — S066X0A Traumatic subarachnoid hemorrhage without loss of consciousness, initial encounter: Secondary | ICD-10-CM | POA: Diagnosis not present

## 2016-01-31 DIAGNOSIS — R402412 Glasgow coma scale score 13-15, at arrival to emergency department: Secondary | ICD-10-CM | POA: Diagnosis present

## 2016-01-31 DIAGNOSIS — I602 Nontraumatic subarachnoid hemorrhage from anterior communicating artery: Secondary | ICD-10-CM | POA: Diagnosis not present

## 2016-01-31 DIAGNOSIS — I161 Hypertensive emergency: Secondary | ICD-10-CM | POA: Diagnosis not present

## 2016-01-31 DIAGNOSIS — I480 Paroxysmal atrial fibrillation: Secondary | ICD-10-CM | POA: Diagnosis not present

## 2016-01-31 DIAGNOSIS — J90 Pleural effusion, not elsewhere classified: Secondary | ICD-10-CM | POA: Diagnosis not present

## 2016-01-31 DIAGNOSIS — Z87891 Personal history of nicotine dependence: Secondary | ICD-10-CM | POA: Diagnosis not present

## 2016-01-31 DIAGNOSIS — R51 Headache: Secondary | ICD-10-CM | POA: Diagnosis not present

## 2016-01-31 DIAGNOSIS — I2121 ST elevation (STEMI) myocardial infarction involving left circumflex coronary artery: Secondary | ICD-10-CM | POA: Diagnosis not present

## 2016-01-31 DIAGNOSIS — I69319 Unspecified symptoms and signs involving cognitive functions following cerebral infarction: Secondary | ICD-10-CM | POA: Diagnosis not present

## 2016-01-31 DIAGNOSIS — I160 Hypertensive urgency: Secondary | ICD-10-CM

## 2016-01-31 DIAGNOSIS — R2689 Other abnormalities of gait and mobility: Secondary | ICD-10-CM | POA: Diagnosis not present

## 2016-01-31 DIAGNOSIS — I69398 Other sequelae of cerebral infarction: Secondary | ICD-10-CM | POA: Diagnosis not present

## 2016-01-31 DIAGNOSIS — D62 Acute posthemorrhagic anemia: Secondary | ICD-10-CM | POA: Diagnosis not present

## 2016-01-31 DIAGNOSIS — R0689 Other abnormalities of breathing: Secondary | ICD-10-CM

## 2016-01-31 DIAGNOSIS — E785 Hyperlipidemia, unspecified: Secondary | ICD-10-CM | POA: Diagnosis present

## 2016-01-31 DIAGNOSIS — E7800 Pure hypercholesterolemia, unspecified: Secondary | ICD-10-CM | POA: Diagnosis present

## 2016-01-31 DIAGNOSIS — Z7289 Other problems related to lifestyle: Secondary | ICD-10-CM

## 2016-01-31 DIAGNOSIS — I498 Other specified cardiac arrhythmias: Secondary | ICD-10-CM | POA: Diagnosis not present

## 2016-01-31 DIAGNOSIS — I6523 Occlusion and stenosis of bilateral carotid arteries: Secondary | ICD-10-CM | POA: Diagnosis not present

## 2016-01-31 DIAGNOSIS — R001 Bradycardia, unspecified: Secondary | ICD-10-CM | POA: Diagnosis not present

## 2016-01-31 DIAGNOSIS — I2119 ST elevation (STEMI) myocardial infarction involving other coronary artery of inferior wall: Secondary | ICD-10-CM | POA: Diagnosis not present

## 2016-01-31 LAB — URINALYSIS, ROUTINE W REFLEX MICROSCOPIC
Bilirubin Urine: NEGATIVE
Glucose, UA: NEGATIVE mg/dL
Ketones, ur: NEGATIVE mg/dL
LEUKOCYTES UA: NEGATIVE
Nitrite: NEGATIVE
PROTEIN: NEGATIVE mg/dL
Specific Gravity, Urine: 1.017 (ref 1.005–1.030)
pH: 7.5 (ref 5.0–8.0)

## 2016-01-31 LAB — CBC
HEMATOCRIT: 43.4 % (ref 39.0–52.0)
HEMOGLOBIN: 14.4 g/dL (ref 13.0–17.0)
MCH: 31 pg (ref 26.0–34.0)
MCHC: 33.2 g/dL (ref 30.0–36.0)
MCV: 93.5 fL (ref 78.0–100.0)
Platelets: 187 10*3/uL (ref 150–400)
RBC: 4.64 MIL/uL (ref 4.22–5.81)
RDW: 13.5 % (ref 11.5–15.5)
WBC: 9 10*3/uL (ref 4.0–10.5)

## 2016-01-31 LAB — DIFFERENTIAL
BASOS ABS: 0 10*3/uL (ref 0.0–0.1)
Basophils Relative: 0 %
EOS ABS: 0.1 10*3/uL (ref 0.0–0.7)
Eosinophils Relative: 1 %
LYMPHS ABS: 1.6 10*3/uL (ref 0.7–4.0)
Lymphocytes Relative: 17 %
MONOS PCT: 9 %
Monocytes Absolute: 0.8 10*3/uL (ref 0.1–1.0)
NEUTROS ABS: 6.5 10*3/uL (ref 1.7–7.7)
Neutrophils Relative %: 73 %

## 2016-01-31 LAB — RAPID URINE DRUG SCREEN, HOSP PERFORMED
Amphetamines: NOT DETECTED
BARBITURATES: NOT DETECTED
Benzodiazepines: NOT DETECTED
Cocaine: NOT DETECTED
Opiates: NOT DETECTED
Tetrahydrocannabinol: NOT DETECTED

## 2016-01-31 LAB — PROTIME-INR
INR: 1.86 — ABNORMAL HIGH (ref 0.00–1.49)
Prothrombin Time: 21.4 seconds — ABNORMAL HIGH (ref 11.6–15.2)

## 2016-01-31 LAB — I-STAT TROPONIN, ED: TROPONIN I, POC: 0.01 ng/mL (ref 0.00–0.08)

## 2016-01-31 LAB — URINE MICROSCOPIC-ADD ON

## 2016-01-31 LAB — COMPREHENSIVE METABOLIC PANEL
ALT: 16 U/L — AB (ref 17–63)
AST: 26 U/L (ref 15–41)
Albumin: 4.3 g/dL (ref 3.5–5.0)
Alkaline Phosphatase: 69 U/L (ref 38–126)
Anion gap: 7 (ref 5–15)
BILIRUBIN TOTAL: 1.2 mg/dL (ref 0.3–1.2)
BUN: 12 mg/dL (ref 6–20)
CALCIUM: 9.6 mg/dL (ref 8.9–10.3)
CO2: 28 mmol/L (ref 22–32)
CREATININE: 0.91 mg/dL (ref 0.61–1.24)
Chloride: 102 mmol/L (ref 101–111)
GFR calc Af Amer: >60 mL/min (ref >60)
Glucose, Bld: 107 mg/dL — ABNORMAL HIGH (ref 65–99)
Potassium: 3.8 mmol/L (ref 3.5–5.1)
Sodium: 137 mmol/L (ref 135–145)
TOTAL PROTEIN: 7.8 g/dL (ref 6.5–8.1)

## 2016-01-31 LAB — I-STAT CHEM 8, ED
BUN: 15 mg/dL (ref 6–20)
CALCIUM ION: 1.14 mmol/L (ref 1.12–1.23)
CREATININE: 0.9 mg/dL (ref 0.61–1.24)
Chloride: 100 mmol/L — ABNORMAL LOW (ref 101–111)
GLUCOSE: 107 mg/dL — AB (ref 65–99)
HCT: 46 % (ref 39.0–52.0)
HEMOGLOBIN: 15.6 g/dL (ref 13.0–17.0)
POTASSIUM: 4.1 mmol/L (ref 3.5–5.1)
Sodium: 139 mmol/L (ref 135–145)
TCO2: 29 mmol/L (ref 0–100)

## 2016-01-31 LAB — ETHANOL: Alcohol, Ethyl (B): <5 mg/dL (ref <5)

## 2016-01-31 LAB — APTT: APTT: 29 s (ref 24–37)

## 2016-01-31 LAB — CBG MONITORING, ED: GLUCOSE-CAPILLARY: 117 mg/dL — AB (ref 65–99)

## 2016-01-31 IMAGING — CT CT ANGIO HEAD
1 series · 1 of 1 positions shown · IV contrast (Iohexol (Omnipaque 350))
Comparison: CT head 01/31/2016

CLINICAL DATA: Subarachnoid hemorrhage. No known trauma. On
Coumadin for aortic valve replacement.

EXAM:
CT ANGIOGRAPHY HEAD AND NECK
TECHNIQUE: Multidetector CT imaging of the head and neck was performed using
the standard protocol during bolus administration of intravenous
contrast. Multiplanar CT image reconstructions and MIPs were
obtained to evaluate the vascular anatomy. Carotid stenosis
measurements (when applicable) are obtained utilizing NASCET
criteria, using the distal internal carotid diameter as the
denominator.
CONTRAST:  50 mL Isovue 370 IV

[Series 4011: batch volume · 1 of 1 slices shown]
[im 1/1]
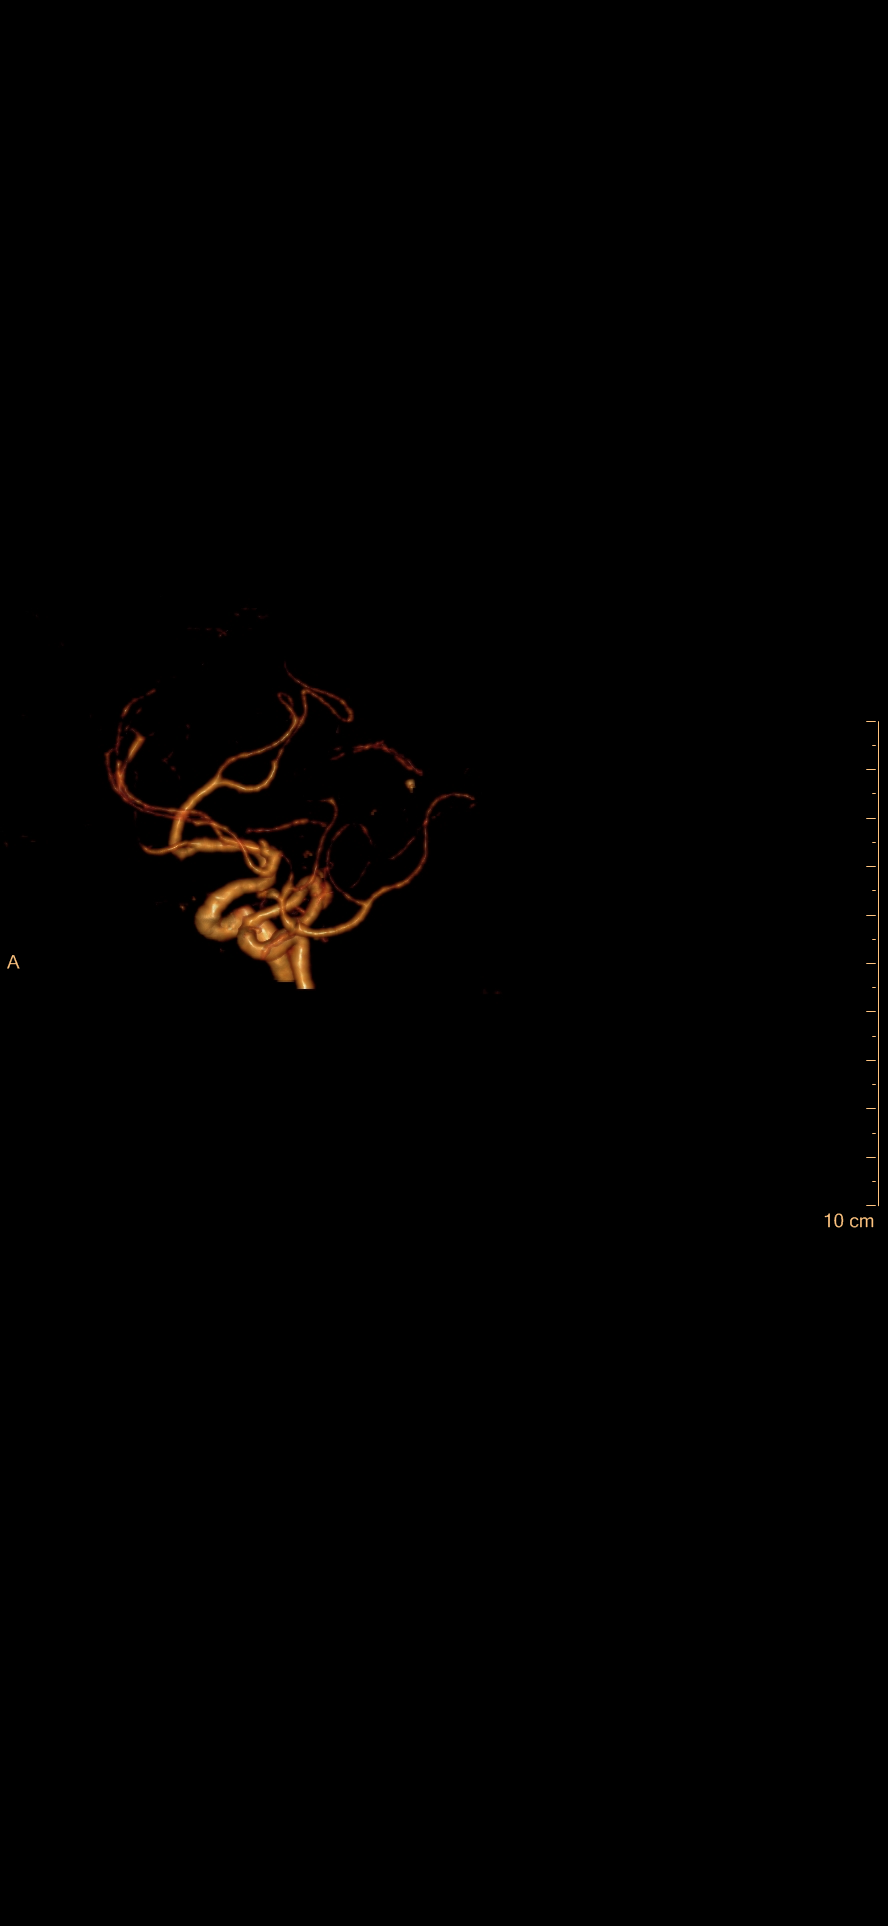

[1 of 1 positions shown; findings below may reference images not displayed]

FINDINGS: CTA NECK

Aortic arch: Atherosclerotic calcification aortic arch without
aneurysm or dissection. Atherosclerotic calcification proximal great
vessels which are widely patent. Lung apices clear.

Right carotid system: Right common carotid artery widely patent.
Mild atherosclerotic calcification in the right carotid bifurcation
without significant stenosis.

Left carotid system: Left common carotid artery widely patent.
Atherosclerotic calcification of the carotid bifurcation without
significant stenosis.

Vertebral arteries:Both vertebral arteries are patent to the basilar
without significant vertebral stenosis.

Skeleton: Moderately severe cervical spondylosis. No fracture or
mass lesion.

Other neck: Negative for mass or adenopathy in the neck.

CTA HEAD

Anterior circulation: Mild atherosclerotic calcification in the
cavernous carotid artery bilaterally. No aneurysm in the cavernous
carotid. Anterior and middle cerebral arteries patent bilaterally.

1.5 x 3 mm aneurysm of the anterior communicating artery on the
right. This is presumably the cause of the symmetric subarachnoid
hemorrhage.

Posterior circulation: Both vertebral arteries patent to the
basilar. PICA patent. Basilar widely patent. Superior cerebellar and
posterior cerebral arteries patent bilaterally.

Venous sinuses: Patent

Anatomic variants: None

Delayed phase: Not performed
IMPRESSION: 1.5 x 3 mm aneurysm of the anterior communicating artery on the
right. This is presumably the cause of acute subarachnoid
hemorrhage. No other aneurysm.

Mild atherosclerotic disease the carotid bifurcation bilaterally
without significant carotid stenosis. No significant vertebral
stenosis.

These results were called by telephone at the time of interpretation
on 01/31/2016 at [DATE] to Dr. LESYA AUJLA , who verbally
acknowledged these results.

## 2016-01-31 MED ORDER — IOPAMIDOL (ISOVUE-370) INJECTION 76%
INTRAVENOUS | Status: AC
  Filled 2016-01-31: qty 50

## 2016-01-31 MED ORDER — LABETALOL HCL 5 MG/ML IV SOLN
20.0000 mg | Freq: Once | INTRAVENOUS | Status: AC
  Administered 2016-01-31: 20 mg via INTRAVENOUS

## 2016-01-31 MED ORDER — NICARDIPINE HCL IN NACL 20-0.86 MG/200ML-% IV SOLN
0.0000 mg/h | INTRAVENOUS | Status: DC
  Administered 2016-02-01: 3 mg/h via INTRAVENOUS
  Administered 2016-02-01: 5 mg/h via INTRAVENOUS
  Filled 2016-01-31 (×2): qty 200

## 2016-01-31 MED ORDER — MORPHINE SULFATE (PF) 2 MG/ML IV SOLN
1.0000 mg | INTRAVENOUS | Status: DC | PRN
  Administered 2016-02-01: 1 mg via INTRAVENOUS
  Administered 2016-02-11 – 2016-02-12 (×2): 2 mg via INTRAVENOUS
  Filled 2016-01-31 (×3): qty 1

## 2016-01-31 MED ORDER — EMPTY CONTAINERS FLEXIBLE MISC
2126.0000 [IU] | Status: AC
  Administered 2016-01-31: 2126 [IU] via INTRAVENOUS
  Filled 2016-01-31: qty 85

## 2016-01-31 MED ORDER — PANTOPRAZOLE SODIUM 40 MG PO PACK: 40.0000 mg | PACK | Freq: Every day | ORAL | Status: DC

## 2016-01-31 MED ORDER — ACETAMINOPHEN 160 MG/5ML PO SOLN: 650.0000 mg | ORAL | Status: DC | PRN

## 2016-01-31 MED ORDER — PANTOPRAZOLE SODIUM 40 MG PO TBEC
40.0000 mg | DELAYED_RELEASE_TABLET | Freq: Every day | ORAL | Status: DC
  Administered 2016-02-02 – 2016-02-13 (×11): 40 mg via ORAL
  Filled 2016-01-31 (×12): qty 1

## 2016-01-31 MED ORDER — STROKE: EARLY STAGES OF RECOVERY BOOK
Freq: Once | Status: AC
  Administered 2016-02-01: 01:00:00
  Filled 2016-01-31: qty 1

## 2016-01-31 MED ORDER — NICARDIPINE HCL IN NACL 20-0.86 MG/200ML-% IV SOLN
3.0000 mg/h | Freq: Once | INTRAVENOUS | Status: AC
  Administered 2016-01-31: 5 mg/h via INTRAVENOUS
  Filled 2016-01-31: qty 200

## 2016-01-31 MED ORDER — VITAMIN K1 10 MG/ML IJ SOLN
10.0000 mg | INTRAVENOUS | Status: AC
  Administered 2016-01-31: 10 mg via INTRAVENOUS
  Filled 2016-01-31: qty 1

## 2016-01-31 MED ORDER — ONDANSETRON HCL 4 MG/2ML IJ SOLN
4.0000 mg | Freq: Four times a day (QID) | INTRAMUSCULAR | Status: DC | PRN
  Administered 2016-02-01 – 2016-02-11 (×5): 4 mg via INTRAVENOUS
  Filled 2016-01-31 (×5): qty 2

## 2016-01-31 MED ORDER — LABETALOL HCL 5 MG/ML IV SOLN
INTRAVENOUS | Status: AC
  Filled 2016-01-31: qty 4

## 2016-01-31 MED ORDER — DOCUSATE SODIUM 100 MG PO CAPS
100.0000 mg | ORAL_CAPSULE | Freq: Two times a day (BID) | ORAL | Status: DC
  Administered 2016-02-01 – 2016-02-13 (×22): 100 mg via ORAL
  Filled 2016-01-31 (×25): qty 1

## 2016-01-31 MED ORDER — SODIUM CHLORIDE 0.9 % IV SOLN
INTRAVENOUS | Status: DC
  Administered 2016-02-01 – 2016-02-04 (×5): via INTRAVENOUS

## 2016-01-31 MED ORDER — NIMODIPINE 30 MG PO CAPS
60.0000 mg | ORAL_CAPSULE | ORAL | Status: DC
  Administered 2016-02-01 – 2016-02-13 (×70): 60 mg via ORAL
  Filled 2016-01-31 (×67): qty 2

## 2016-01-31 MED ORDER — ACETAMINOPHEN 325 MG PO TABS
650.0000 mg | ORAL_TABLET | ORAL | Status: DC | PRN
  Administered 2016-02-04 – 2016-02-10 (×18): 650 mg via ORAL
  Filled 2016-01-31 (×17): qty 2

## 2016-01-31 MED ORDER — TRAMADOL HCL 50 MG PO TABS
50.0000 mg | ORAL_TABLET | Freq: Four times a day (QID) | ORAL | Status: DC | PRN
  Administered 2016-02-08 – 2016-02-09 (×3): 50 mg via ORAL
  Administered 2016-02-09: 100 mg via ORAL
  Administered 2016-02-10 (×2): 50 mg via ORAL
  Administered 2016-02-11: 100 mg via ORAL
  Administered 2016-02-11 – 2016-02-12 (×3): 50 mg via ORAL
  Administered 2016-02-12 – 2016-02-13 (×2): 100 mg via ORAL
  Filled 2016-01-31: qty 1
  Filled 2016-01-31: qty 2
  Filled 2016-01-31: qty 1
  Filled 2016-01-31: qty 2
  Filled 2016-01-31 (×5): qty 1
  Filled 2016-01-31 (×3): qty 2
  Filled 2016-01-31: qty 1

## 2016-01-31 MED ORDER — NIMODIPINE 60 MG/20ML PO SOLN
60.0000 mg | ORAL | Status: DC
  Filled 2016-01-31 (×9): qty 20

## 2016-01-31 MED ORDER — ONDANSETRON 4 MG PO TBDP
4.0000 mg | ORAL_TABLET | Freq: Four times a day (QID) | ORAL | Status: DC | PRN
  Filled 2016-01-31: qty 1

## 2016-01-31 NOTE — ED Notes (Signed)
[facility] contacted to call Code Stroke 

## 2016-01-31 NOTE — ED Notes (Signed)
Presents with sudden onset of severe headache described as pressure that began at 6:30 this evening. Associated with hypertension, nausea. FAmily states, "He was just sitting there with [place] and all of a sudden he clutched his head and started saying, the pressure, my head, the pressure" he deniess hx of headaches. No facial droop or arm drift. Initial pressure was 220/89 at [facility]. Here 122/98, still c/o severe headache and nausea. He is alert and answering questions appropriately. HArd of hearing.

## 2016-01-31 NOTE — H&P (Signed)
Reason for Consult:SAH Referring Physician: [name] is an 82 y.o. male.  HPI: Jason Hawkins is an 82 y.o. male with a history of atrial fibrillation, aortic valve replacement, hypertension, hypercholesterolemia and lower extremity edema brought to the emergency room following sudden onset of severe frontal headache. Recent subsequently became nauseated as well. No focal deficits were noted. Patient has been on Coumadin for anticoagulation. INR was 1.8. ET scan of the head showed acute subarachnoid hemorrhage along the anterior and middle cerebral arteries. No clear aneurysm was seen. Cerebral angiogram showed a 1.5 x 3 mm near result the anterior communicating artery on the right, presenting to be source of acute subarachnoid hemorrhage. Blood pressure was elevated in the emergency room with systolic blood pressure greater than 200 including after being given IV labetalol. Cardene drip was initiated. Reversal of anticoagulation with vitamin K and Kcentra was also initiated.  Patient has headache and neck stiffness.  Past Medical History  Diagnosis Date  . Chronic atrial fibrillation ([place])   . Severe aortic stenosis     s/p aortic valve replacement using a pericardial tissue valve  . HTN (hypertension)   . Lower extremity edema   . Hypercholesteremia     Past Surgical History  Procedure Laterality Date  . Aortic valve replacement  08/30/2003    Dr. Cyndia Bent    Family History  Problem Relation Age of Onset  . Heart attack    . Diabetes    . Diabetes Mother   . Heart disease Father   . Heart attack Father     Social History:  reports that he has quit smoking. He does not have any smokeless tobacco history on file. He reports that he drinks about 8.4 oz of alcohol per week. He reports that he does not use illicit drugs.  Allergies:  Allergies  Allergen Reactions  . Bee Venom Anaphylaxis  . Codeine     nausea    Medications: I have reviewed the patient's current  medications.  Results for orders placed or performed during the hospital encounter of 01/31/16 (from the past 48 hour(s))  CBG monitoring, ED     Status: Abnormal   Collection Time: 01/31/16  9:05 PM  Result Value Ref Range   Glucose-Capillary 117 (H) 65 - 99 mg/dL  Ethanol     Status: None   Collection Time: 01/31/16  9:06 PM  Result Value Ref Range   Alcohol, Ethyl (B) <5 <5 mg/dL    Comment:        LOWEST DETECTABLE LIMIT FOR SERUM ALCOHOL IS 5 mg/dL FOR MEDICAL PURPOSES ONLY   Protime-INR     Status: Abnormal   Collection Time: 01/31/16  9:06 PM  Result Value Ref Range   Prothrombin Time 21.4 (H) 11.6 - 15.2 seconds   INR 1.86 (H) 0.00 - 1.49  APTT     Status: None   Collection Time: 01/31/16  9:06 PM  Result Value Ref Range   aPTT 29 24 - 37 seconds  CBC     Status: None   Collection Time: 01/31/16  9:06 PM  Result Value Ref Range   WBC 9.0 4.0 - 10.5 K/uL   RBC 4.64 4.22 - 5.81 MIL/uL   Hemoglobin 14.4 13.0 - 17.0 g/dL   HCT 43.4 39.0 - 52.0 %   MCV 93.5 78.0 - 100.0 fL   MCH 31.0 26.0 - 34.0 pg   MCHC 33.2 30.0 - 36.0 g/dL   RDW 13.5 11.5 - 15.5 %  Platelets 187 150 - 400 K/uL  Differential     Status: None   Collection Time: 01/31/16  9:06 PM  Result Value Ref Range   Neutrophils Relative % 73 %   Neutro Abs 6.5 1.7 - 7.7 K/uL   Lymphocytes Relative 17 %   Lymphs Abs 1.6 0.7 - 4.0 K/uL   Monocytes Relative 9 %   Monocytes Absolute 0.8 0.1 - 1.0 K/uL   Eosinophils Relative 1 %   Eosinophils Absolute 0.1 0.0 - 0.7 K/uL   Basophils Relative 0 %   Basophils Absolute 0.0 0.0 - 0.1 K/uL  Comprehensive metabolic panel     Status: Abnormal   Collection Time: 01/31/16  9:06 PM  Result Value Ref Range   Sodium 137 135 - 145 mmol/L   Potassium 3.8 3.5 - 5.1 mmol/L   Chloride 102 101 - 111 mmol/L   CO2 28 22 - 32 mmol/L   Glucose, Bld 107 (H) 65 - 99 mg/dL   BUN 12 6 - 20 mg/dL   Creatinine, Ser 0.91 0.61 - 1.24 mg/dL   Calcium 9.6 8.9 - 10.3 mg/dL   Total  Protein 7.8 6.5 - 8.1 g/dL   Albumin 4.3 3.5 - 5.0 g/dL   AST 26 15 - 41 U/L   ALT 16 (L) 17 - 63 U/L   Alkaline Phosphatase 69 38 - 126 U/L   Total Bilirubin 1.2 0.3 - 1.2 mg/dL   GFR calc non Af Amer >60 >60 mL/min   GFR calc Af Amer >60 >60 mL/min    Comment: (NOTE) The eGFR has been calculated using the CKD EPI equation. This calculation has not been validated in all clinical situations. eGFR's persistently <60 mL/min signify possible Chronic Kidney Disease.    Anion gap 7 5 - 15  I-stat troponin, ED (not at [facility], [facility])     Status: None   Collection Time: 01/31/16  9:17 PM  Result Value Ref Range   Troponin i, poc 0.01 0.00 - 0.08 ng/mL   Comment 3            Comment: Due to the release kinetics of cTnI, a negative result within the first hours of the onset of symptoms does not rule out myocardial infarction with certainty. If myocardial infarction is still suspected, repeat the test at appropriate intervals.   Urine rapid drug screen (hosp performed)not at [facility]     Status: None   Collection Time: 01/31/16  9:17 PM  Result Value Ref Range   Opiates NONE DETECTED NONE DETECTED   Cocaine NONE DETECTED NONE DETECTED   Benzodiazepines NONE DETECTED NONE DETECTED   Amphetamines NONE DETECTED NONE DETECTED   Tetrahydrocannabinol NONE DETECTED NONE DETECTED   Barbiturates NONE DETECTED NONE DETECTED    Comment:        DRUG SCREEN FOR MEDICAL PURPOSES ONLY.  IF CONFIRMATION IS NEEDED FOR ANY PURPOSE, NOTIFY LAB WITHIN 5 DAYS.        LOWEST DETECTABLE LIMITS FOR URINE DRUG SCREEN Drug Class       Cutoff (ng/mL) Amphetamine      1000 Barbiturate      200 Benzodiazepine   224 Tricyclics       497 Opiates          300 Cocaine          300 THC              50   I-Stat Chem 8, ED  (not at [facility], [facility])  Status: Abnormal   Collection Time: 01/31/16  9:19 PM  Result Value Ref Range   Sodium 139 135 - 145 mmol/L   Potassium 4.1 3.5 - 5.1 mmol/L   Chloride 100 (L) 101 - 111  mmol/L   BUN 15 6 - 20 mg/dL   Creatinine, Ser 0.90 0.61 - 1.24 mg/dL   Glucose, Bld 107 (H) 65 - 99 mg/dL   Calcium, Ion 1.14 1.12 - 1.23 mmol/L   TCO2 29 0 - 100 mmol/L   Hemoglobin 15.6 13.0 - 17.0 g/dL   HCT 46.0 39.0 - 52.0 %  Urinalysis, Routine w reflex microscopic (not at [facility])     Status: Abnormal   Collection Time: 01/31/16  9:19 PM  Result Value Ref Range   Color, Urine YELLOW YELLOW   APPearance CLEAR CLEAR   Specific Gravity, Urine 1.017 1.005 - 1.030   pH 7.5 5.0 - 8.0   Glucose, UA NEGATIVE NEGATIVE mg/dL   Hgb urine dipstick SMALL (A) NEGATIVE   Bilirubin Urine NEGATIVE NEGATIVE   Ketones, ur NEGATIVE NEGATIVE mg/dL   Protein, ur NEGATIVE NEGATIVE mg/dL   Nitrite NEGATIVE NEGATIVE   Leukocytes, UA NEGATIVE NEGATIVE  Urine microscopic-add on     Status: Abnormal   Collection Time: 01/31/16  9:19 PM  Result Value Ref Range   Squamous Epithelial / LPF 0-5 (A) NONE SEEN   WBC, UA 0-5 0 - 5 WBC/hpf   RBC / HPF 6-30 0 - 5 RBC/hpf   Bacteria, UA FEW (A) NONE SEEN    Ct Angio Head W Or Wo Contrast  01/31/2016  CLINICAL DATA:  Subarachnoid hemorrhage. No known trauma. On Coumadin for aortic valve replacement. EXAM: CT ANGIOGRAPHY HEAD AND NECK TECHNIQUE: Multidetector CT imaging of the head and neck was performed using the standard protocol during bolus administration of intravenous contrast. Multiplanar CT image reconstructions and MIPs were obtained to evaluate the vascular anatomy. Carotid stenosis measurements (when applicable) are obtained utilizing NASCET criteria, using the distal internal carotid diameter as the denominator. CONTRAST:  50 mL Isovue 370 IV COMPARISON:  CT head 01/31/2016 FINDINGS: CTA NECK Aortic arch: Atherosclerotic calcification aortic arch without aneurysm or dissection. Atherosclerotic calcification proximal great vessels which are widely patent. Lung apices clear. Right carotid system: Right common carotid artery widely patent. Mild atherosclerotic  calcification in the right carotid bifurcation without significant stenosis. Left carotid system: Left common carotid artery widely patent. Atherosclerotic calcification of the carotid bifurcation without significant stenosis. Vertebral arteries:Both vertebral arteries are patent to the basilar without significant vertebral stenosis. Skeleton: Moderately severe cervical spondylosis. No fracture or mass lesion. Other neck: Negative for mass or adenopathy in the neck. CTA HEAD Anterior circulation: Mild atherosclerotic calcification in the cavernous carotid artery bilaterally. No aneurysm in the cavernous carotid. Anterior and middle cerebral arteries patent bilaterally. 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 This is presumably the cause of the symmetric subarachnoid hemorrhage. Posterior circulation: Both vertebral arteries patent to the basilar. PICA patent. Basilar widely patent. Superior cerebellar and posterior cerebral arteries patent bilaterally. 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 This is presumably the cause of acute subarachnoid hemorrhage. No other aneurysm. Mild atherosclerotic disease the carotid bifurcation bilaterally without significant carotid stenosis. No significant vertebral stenosis. These results were called by telephone at the time of interpretation on 01/31/2016 at 10:12 pm to Dr. Wallie Char ,  who verbally acknowledged these results. Electronically Signed   By: Franchot Gallo M.D.   On: 01/31/2016 22:14   Ct Head Wo Contrast  01/31/2016  CLINICAL DATA:  Sudden onset of headaches EXAM: CT HEAD WITHOUT CONTRAST TECHNIQUE: Contiguous axial images were obtained from the base of the skull through the vertex without intravenous contrast. COMPARISON:  None. FINDINGS: Bony calvarium is intact. There are changes consistent with subarachnoid  hemorrhage along the distribution of the middle cerebral arteries and anterior cerebral arteries bilaterally. Given the patient's clinical history these changes are consistent with ruptured aneurysm. No intraventricular hemorrhage is noted. The basilar cisterns are not filled with blood. No mass lesion is noted. No acute infarct is seen. Mild atrophic changes are noted. IMPRESSION: Subarachnoid hemorrhage as described above. A definitive aneurysm is not noted on this exam. Critical Value/emergent results were called by telephone at the time of interpretation on 01/31/2016 at 9:28 pm to Dr. Nicole Kindred, who verbally acknowledged these results. Electronically Signed   By: Inez Catalina M.D.   On: 01/31/2016 21:30   Ct Angio Neck W Or Wo Contrast  01/31/2016  CLINICAL DATA:  Subarachnoid hemorrhage. No known trauma. On Coumadin for aortic valve replacement. EXAM: CT ANGIOGRAPHY HEAD AND NECK TECHNIQUE: Multidetector CT imaging of the head and neck was performed using the standard protocol during bolus administration of intravenous contrast. Multiplanar CT image reconstructions and MIPs were obtained to evaluate the vascular anatomy. Carotid stenosis measurements (when applicable) are obtained utilizing NASCET criteria, using the distal internal carotid diameter as the denominator. CONTRAST:  50 mL Isovue 370 IV COMPARISON:  CT head 01/31/2016 FINDINGS: CTA NECK Aortic arch: Atherosclerotic calcification aortic arch without aneurysm or dissection. Atherosclerotic calcification proximal great vessels which are widely patent. Lung apices clear. Right carotid system: Right common carotid artery widely patent. Mild atherosclerotic calcification in the right carotid bifurcation without significant stenosis. Left carotid system: Left common carotid artery widely patent. Atherosclerotic calcification of the carotid bifurcation without significant stenosis. Vertebral arteries:Both vertebral arteries are patent to the basilar  without significant vertebral stenosis. Skeleton: Moderately severe cervical spondylosis. No fracture or mass lesion. Other neck: Negative for mass or adenopathy in the neck. CTA HEAD Anterior circulation: Mild atherosclerotic calcification in the cavernous carotid artery bilaterally. No aneurysm in the cavernous carotid. Anterior and middle cerebral arteries patent bilaterally. 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 This is presumably the cause of the symmetric subarachnoid hemorrhage. Posterior circulation: Both vertebral arteries patent to the basilar. PICA patent. Basilar widely patent. Superior cerebellar and posterior cerebral arteries patent bilaterally. 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 This is presumably the cause of acute subarachnoid hemorrhage. No other aneurysm. Mild atherosclerotic disease the carotid bifurcation bilaterally without significant carotid stenosis. No significant vertebral stenosis. These results were called by telephone at the time of interpretation on 01/31/2016 at 10:12 pm to Dr. Wallie Char , who verbally acknowledged these results. Electronically Signed   By: Franchot Gallo M.D.   On: 01/31/2016 22:14    Review of Systems - Negative except as above .  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  He has had to stop tennis due to knee arthritis.   Blood pressure 191/85, pulse 70, temperature 98.4 F (36.9 C), temperature source Oral, resp. rate 24, height 5' 9" (1.753 m), weight 84.369 kg (186 lb), SpO2  98 %. Physical Exam  Constitutional: He is oriented to person, place, and time. He appears well-developed and well-nourished.  HENT:  Head: Normocephalic and atraumatic.  Eyes: Conjunctivae are normal. Pupils are equal, round, and reactive to light.  Neck: Rigidity present.  Cardiovascular:  Normal rate.  An irregular rhythm present.  Respiratory: Effort normal and breath sounds normal.  GI: Soft. Normal appearance and bowel sounds are normal.  Neurological: He is alert and oriented to person, place, and time. He has normal strength and normal reflexes. No cranial nerve deficit or sensory deficit. GCS eye subscore is 4. GCS verbal subscore is 5. GCS motor subscore is 6.  Patient has headache and stiff neck.  No drift.  Psychiatric: He has a normal mood and affect. His speech is normal and behavior is normal. Judgment and thought content normal. Cognition and memory are normal.    Assessment/Plan: Patient is 82 year old man with SAH likely from [place]. Artery aneurysm.  He is on coumadin for A. Fib., which is being reversed.  Dr. Kathyrn Sheriff will evaluate patient and likely perform angiography with endovascular management of aneurysm, if this is possible.  D/W patient and his family.  Peggyann Shoals, MD 01/31/2016, 10:47 PM

## 2016-01-31 NOTE — ED Provider Notes (Signed)
CSN: [id number]     Arrival date & time 01/31/16  2038 History   First MD Initiated Contact with Patient 01/31/16 2112     Chief Complaint  Patient presents with  . Code Stroke    HPI Comments: 82 y.o. male presents ED with "the worst headache of his life" that began suddenly at 6:30 PM. Associated with nausea and dizziness.  Patient is a 82 y.o. male presenting with headaches. The history is provided by the patient.  Headache Pain location:  Generalized Quality:  Unable to specify Radiates to:  L neck and R neck Severity at highest:  10/10 Onset quality:  Sudden Duration: Began at 6:30pm. Timing:  Constant Progression:  Improving Chronicity:  New Similar to prior headaches: no   Context comment:  Spontaneously Relieved by:  None tried Worsened by:  Nothing Ineffective treatments:  None tried Associated symptoms: dizziness, nausea, neck pain and vomiting   Associated symptoms: no fever     Past Medical History  Diagnosis Date  . Chronic atrial fibrillation ([place])   . Severe aortic stenosis     s/p aortic valve replacement using a pericardial tissue valve  . HTN (hypertension)   . Lower extremity edema   . Hypercholesteremia    Past Surgical History  Procedure Laterality Date  . Aortic valve replacement  08/30/2003    Dr. Cyndia Bent   Family History  Problem Relation Age of Onset  . Heart attack    . Diabetes    . Diabetes Mother   . Heart disease Father   . Heart attack Father    Social History  Substance Use Topics  . Smoking status: Former Research scientist (life sciences)  . Smokeless tobacco: None  . Alcohol Use: 8.4 oz/week    14 Cans of beer per week    Review of Systems  Unable to perform ROS: Acuity of condition  Constitutional: Negative for fever and chills.  Gastrointestinal: Positive for nausea and vomiting.  Musculoskeletal: Positive for neck pain.  Neurological: Positive for dizziness and headaches.      Allergies  Bee venom and Codeine  Home Medications   Prior  to Admission medications   Medication Sig Start Date End Date Taking? Authorizing Provider  co-enzyme Q-10 50 MG capsule Take 50 mg by mouth daily.    Historical Provider, MD  diphenhydrAMINE (BENADRYL) 25 MG tablet Take 2 tablets (50 mg total) by mouth every 6 (six) hours as needed for itching or allergies (bee sting). 03/17/13   Evie Lacks Plotnikov, MD  EPINEPHrine 0.3 mg/0.3 mL IJ SOAJ injection Inject 0.3 mg into the muscle once.    Historical Provider, MD  GLUCOSAMINE-CHONDROITIN PO Take 1 tablet by mouth daily.      Historical Provider, MD  Hyaluronic Acid 20-60 MG CAPS Take 1 tablet by mouth daily.      Historical Provider, MD  L-ARGININE PO Take 1 capsule by mouth 4 (four) times a week.    Historical Provider, MD  L-CITRULLINE PO Take 1 capsule by mouth 4 (four) times a week.    Historical Provider, MD  Multiple Vitamin (MULTIVITAMIN) capsule Take 1 capsule by mouth daily.      Historical Provider, MD  OVER THE COUNTER MEDICATION Take 1 Dose by mouth 4 (four) times a week. Helps with Testerone.    Historical Provider, MD  Red Yeast Rice 600 MG CAPS Take 1 capsule by mouth daily. Reported on 12/28/2015    Historical Provider, MD  TURMERIC PO Take 1 tablet by mouth daily.  Historical Provider, MD  warfarin (COUMADIN) 5 MG tablet Take 1 tablet (5 mg total) by mouth as directed. 01/27/16   Josue Hector, MD   BP 122/92 mmHg  Pulse 70  Temp(Src) 98.3 F (36.8 C) (Oral)  Resp 20  Ht 5\' 9"  (1.753 m)  Wt 84.369 kg  BMI 27.45 kg/m2  SpO2 100% Physical Exam  Constitutional: He is oriented to person, place, and time. He appears well-developed and well-nourished. No distress.  HENT:  Head: Normocephalic and atraumatic.  Right Ear: External ear normal.  Left Ear: External ear normal.  Mouth/Throat: Oropharynx is clear and moist.  Neck: Normal range of motion.  Cardiovascular: Normal rate and regular rhythm.   Pulmonary/Chest: Effort normal and breath sounds normal.  Abdominal: Soft. He  exhibits no distension. There is no tenderness.  Neurological: He is alert and oriented to person, place, and time. GCS eye subscore is 4. GCS verbal subscore is 5. GCS motor subscore is 6.  Face symmetric, speech clear and appropriate, normal strength in major muscle groups of upper and lower extremities, gait deferred  Skin: Skin is warm and dry. He is not diaphoretic.  Psychiatric: He has a normal mood and affect.    ED Course  Procedures  Labs Review Labs Reviewed  PROTIME-INR - Abnormal; Notable for the following:    Prothrombin Time 21.4 (*)    INR 1.86 (*)    All other components within normal limits  COMPREHENSIVE METABOLIC PANEL - Abnormal; Notable for the following:    Glucose, Bld 107 (*)    ALT 16 (*)    All other components within normal limits  URINALYSIS, ROUTINE W REFLEX MICROSCOPIC (NOT AT [facility]) - Abnormal; Notable for the following:    Hgb urine dipstick SMALL (*)    All other components within normal limits  URINE MICROSCOPIC-ADD ON - Abnormal; Notable for the following:    Squamous Epithelial / LPF 0-5 (*)    Bacteria, UA FEW (*)    All other components within normal limits  I-STAT CHEM 8, ED - Abnormal; Notable for the following:    Chloride 100 (*)    Glucose, Bld 107 (*)    All other components within normal limits  CBG MONITORING, ED - Abnormal; Notable for the following:    Glucose-Capillary 117 (*)    All other components within normal limits  ETHANOL  APTT  CBC  DIFFERENTIAL  URINE RAPID DRUG SCREEN, HOSP PERFORMED  PROTIME-INR  PROTIME-INR  CBC  PROTIME-INR  APTT  URINE RAPID DRUG SCREEN, HOSP PERFORMED  PROTIME-INR  PROTIME-INR  I-STAT TROPOININ, ED  CBG MONITORING, ED    Imaging Review Ct Angio Head W Or Wo Contrast  01/31/2016  CLINICAL DATA:  Subarachnoid hemorrhage. No known trauma. On Coumadin for aortic valve replacement. EXAM: CT ANGIOGRAPHY HEAD AND NECK TECHNIQUE: Multidetector CT imaging of the head and neck was performed  using the standard protocol during bolus administration of intravenous contrast. Multiplanar CT image reconstructions and MIPs were obtained to evaluate the vascular anatomy. Carotid stenosis measurements (when applicable) are obtained utilizing NASCET criteria, using the distal internal carotid diameter as the denominator. CONTRAST:  50 mL Isovue 370 IV COMPARISON:  CT head 01/31/2016 FINDINGS: CTA NECK Aortic arch: Atherosclerotic calcification aortic arch without aneurysm or dissection. Atherosclerotic calcification proximal great vessels which are widely patent. Lung apices clear. Right carotid system: Right common carotid artery widely patent. Mild atherosclerotic calcification in the right carotid bifurcation without significant stenosis. Left carotid system: Left  common carotid artery widely patent. Atherosclerotic calcification of the carotid bifurcation without significant stenosis. Vertebral arteries:Both vertebral arteries are patent to the basilar without significant vertebral stenosis. Skeleton: Moderately severe cervical spondylosis. No fracture or mass lesion. Other neck: Negative for mass or adenopathy in the neck. CTA HEAD Anterior circulation: Mild atherosclerotic calcification in the cavernous carotid artery bilaterally. No aneurysm in the cavernous carotid. Anterior and middle cerebral arteries patent bilaterally. 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 This is presumably the cause of the symmetric subarachnoid hemorrhage. Posterior circulation: Both vertebral arteries patent to the basilar. PICA patent. Basilar widely patent. Superior cerebellar and posterior cerebral arteries patent bilaterally. 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 This is presumably the cause of acute subarachnoid hemorrhage. No other aneurysm. Mild atherosclerotic disease the  carotid bifurcation bilaterally without significant carotid stenosis. No significant vertebral stenosis. These results were called by telephone at the time of interpretation on 01/31/2016 at 10:12 pm to Dr. Wallie Char , who verbally acknowledged these results. Electronically Signed   By: Franchot Gallo M.D.   On: 01/31/2016 22:14   Ct Head Wo Contrast  01/31/2016  CLINICAL DATA:  Sudden onset of headaches EXAM: CT HEAD WITHOUT CONTRAST TECHNIQUE: Contiguous axial images were obtained from the base of the skull through the vertex without intravenous contrast. COMPARISON:  None. FINDINGS: Bony calvarium is intact. There are changes consistent with subarachnoid hemorrhage along the distribution of the middle cerebral arteries and anterior cerebral arteries bilaterally. Given the patient's clinical history these changes are consistent with ruptured aneurysm. No intraventricular hemorrhage is noted. The basilar cisterns are not filled with blood. No mass lesion is noted. No acute infarct is seen. Mild atrophic changes are noted. IMPRESSION: Subarachnoid hemorrhage as described above. A definitive aneurysm is not noted on this exam. Critical Value/emergent results were called by telephone at the time of interpretation on 01/31/2016 at 9:28 pm to Dr. Nicole Kindred, who verbally acknowledged these results. Electronically Signed   By: Inez Catalina M.D.   On: 01/31/2016 21:30   Ct Angio Neck W Or Wo Contrast  01/31/2016  CLINICAL DATA:  Subarachnoid hemorrhage. No known trauma. On Coumadin for aortic valve replacement. EXAM: CT ANGIOGRAPHY HEAD AND NECK TECHNIQUE: Multidetector CT imaging of the head and neck was performed using the standard protocol during bolus administration of intravenous contrast. Multiplanar CT image reconstructions and MIPs were obtained to evaluate the vascular anatomy. Carotid stenosis measurements (when applicable) are obtained utilizing NASCET criteria, using the distal internal carotid diameter  as the denominator. CONTRAST:  50 mL Isovue 370 IV COMPARISON:  CT head 01/31/2016 FINDINGS: CTA NECK Aortic arch: Atherosclerotic calcification aortic arch without aneurysm or dissection. Atherosclerotic calcification proximal great vessels which are widely patent. Lung apices clear. Right carotid system: Right common carotid artery widely patent. Mild atherosclerotic calcification in the right carotid bifurcation without significant stenosis. Left carotid system: Left common carotid artery widely patent. Atherosclerotic calcification of the carotid bifurcation without significant stenosis. Vertebral arteries:Both vertebral arteries are patent to the basilar without significant vertebral stenosis. Skeleton: Moderately severe cervical spondylosis. No fracture or mass lesion. Other neck: Negative for mass or adenopathy in the neck. CTA HEAD Anterior circulation: Mild atherosclerotic calcification in the cavernous carotid artery bilaterally. No aneurysm in the cavernous carotid. Anterior and middle cerebral arteries patent bilaterally. 1.5 x 3 mm aneurysm of the anterior communicating  artery on the right. This is presumably the cause of the symmetric subarachnoid hemorrhage. Posterior circulation: Both vertebral arteries patent to the basilar. PICA patent. Basilar widely patent. Superior cerebellar and posterior cerebral arteries patent bilaterally. 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 This is presumably the cause of acute subarachnoid hemorrhage. No other aneurysm. Mild atherosclerotic disease the carotid bifurcation bilaterally without significant carotid stenosis. No significant vertebral stenosis. These results were called by telephone at the time of interpretation on 01/31/2016 at 10:12 pm to Dr. Wallie Char , who verbally acknowledged these results. Electronically Signed   By: Franchot Gallo M.D.   On: 01/31/2016 22:14   I have personally reviewed and evaluated these images and lab results as part of my medical decision-making.   EKG Interpretation None      MDM   Final diagnoses:  SAH (subarachnoid hemorrhage) (HCC)  SAH (subarachnoid hemorrhage) ([place])    82 y.o. male presents with headache concerning for subarachnoid hemorrhage - sudden onset, worst headache of life, associated with nausea/vomiting/neck pain. He was made a code stroke in triage. 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 Neurosurgery was consulted. Neurosurgery admitted the patient. He remained stable in the ED. He is on Coumadin for chronic A. fib, and his INR was 1.86. He was given K central and vitamin K. Labetalol and Cardene were ordered to help control his blood pressure.  Case managed in conjunction with my attending, Dr. Reather Converse.   Berenice Primas, MD 02/01/16 [id number]  Elnora Morrison, MD 02/02/16 [phone]

## 2016-01-31 NOTE — Consult Note (Signed)
Admission H&P    Chief Complaint: Sudden severe headache.  HPI: Jason Hawkins is an 82 y.o. male with a history of atrial fibrillation, aortic valve replacement, hypertension, hypercholesterolemia and lower extremity edema brought to the emergency room following sudden onset of severe frontal headache. Recent subsequently became nauseated as well. No focal deficits were noted. Patient has been on Coumadin for anticoagulation. INR was 1.8. ET scan of the head showed acute subarachnoid hemorrhage along the anterior and middle cerebral arteries. No clear aneurysm was seen. Cerebral angiogram showed a 1.5 x 3 mm near result the anterior communicating artery on the right, presenting to be source of acute subarachnoid hemorrhage. Blood pressure was elevated in the emergency room with systolic blood pressure greater than 200 including after being given IV labetalol. Cardene drip was initiated. Reversal of anticoagulation with vitamin K and Kcentra was also initiated. Neurosurgery was consulted.  LSN: 6:30 PM on 01/31/2016 tPA Given: No: Acute SAH mRankin:  Past Medical History  Diagnosis Date  . Chronic atrial fibrillation ([place])   . Severe aortic stenosis     s/p aortic valve replacement using a pericardial tissue valve  . HTN (hypertension)   . Lower extremity edema   . Hypercholesteremia     Past Surgical History  Procedure Laterality Date  . Aortic valve replacement  08/30/2003    Dr. Cyndia Bent    Family History  Problem Relation Age of Onset  . Heart attack    . Diabetes    . Diabetes Mother   . Heart disease Father   . Heart attack Father    Social History:  reports that he has quit smoking. He does not have any smokeless tobacco history on file. He reports that he drinks about 8.4 oz of alcohol per week. He reports that he does not use illicit drugs.  Allergies:  Allergies  Allergen Reactions  . Bee Venom Anaphylaxis  . Codeine     nausea    Medications: Preadmission  medications were reviewed by me.  ROS: History obtained from the patient, as well as his son and daughter-in-law.  General ROS: negative for - chills, fatigue, fever, night sweats, weight gain or weight loss Psychological ROS: negative for - behavioral disorder, hallucinations, memory difficulties, mood swings or suicidal ideation Ophthalmic ROS: negative for - blurry vision, double vision, eye pain or loss of vision ENT ROS: negative for - epistaxis, nasal discharge, oral lesions, sore throat, tinnitus or vertigo Allergy and Immunology ROS: negative for - hives or itchy/watery eyes Hematological and Lymphatic ROS: negative for - bleeding problems, bruising or swollen lymph nodes Endocrine ROS: negative for - galactorrhea, hair pattern changes, polydipsia/polyuria or temperature intolerance Respiratory ROS: negative for - cough, hemoptysis, shortness of breath or wheezing Cardiovascular ROS: negative for - chest pain, dyspnea on exertion, edema or irregular heartbeat Gastrointestinal ROS: negative for - abdominal pain, diarrhea, hematemesis, nausea/vomiting or stool incontinence Genito-Urinary ROS: negative for - dysuria, hematuria, incontinence or urinary frequency/urgency Musculoskeletal ROS: negative for - joint swelling or muscular weakness Neurological ROS: as noted in HPI Dermatological ROS: negative for rash and skin lesion changes  Physical Examination: Blood pressure 194/89, pulse 80, temperature 98.4 F (36.9 C), temperature source Oral, resp. rate 25, height '5\' 9"'  (1.753 m), weight 84.369 kg (186 lb), SpO2 99 %.  HEENT-  Normocephalic, no lesions, without obvious abnormality.  Normal external eye and conjunctiva.  Normal TM's bilaterally.  Normal auditory canals and external ears. Normal external nose, mucus membranes and septum.  Normal  pharynx. Neck supple with no masses, nodes, nodules or enlargement. Cardiovascular - irregularly irregular rhythm, S1, S2 normal and no S3 or  S4 Lungs - chest clear, no wheezing, rales, normal symmetric air entry Abdomen - soft, non-tender; bowel sounds normal; no masses,  no organomegaly Extremities - no joint deformities, effusion, or inflammation  Neurologic Examination: Mental Status: Alert, oriented, complaining of headache of 6/10 intensity.  Speech fluent without evidence of aphasia. Able to follow commands without difficulty. Cranial Nerves: II-Visual fields were normal. III/IV/VI-Pupils were equal and reacted normally to light. Extraocular movements were full and conjugate.    V/VII-no facial numbness and no facial weakness. VIII-moderately severe bilateral hearing loss X-normal speech. XI: trapezius strength/neck flexion strength normal bilaterally XII-midline tongue extension with normal strength. Motor: 5/5 bilaterally with normal tone and bulk Sensory: Normal throughout. Deep Tendon Reflexes: 1+ and symmetric. Plantars: Flexor bilaterally Cerebellar: Normal coordination of upper extremities. Carotid auscultation: Normal  Results for orders placed or performed during the hospital encounter of 01/31/16 (from the past 48 hour(s))  CBG monitoring, ED     Status: Abnormal   Collection Time: 01/31/16  9:05 PM  Result Value Ref Range   Glucose-Capillary 117 (H) 65 - 99 mg/dL  Ethanol     Status: None   Collection Time: 01/31/16  9:06 PM  Result Value Ref Range   Alcohol, Ethyl (B) <5 <5 mg/dL    Comment:        LOWEST DETECTABLE LIMIT FOR SERUM ALCOHOL IS 5 mg/dL FOR MEDICAL PURPOSES ONLY   Protime-INR     Status: Abnormal   Collection Time: 01/31/16  9:06 PM  Result Value Ref Range   Prothrombin Time 21.4 (H) 11.6 - 15.2 seconds   INR 1.86 (H) 0.00 - 1.49  APTT     Status: None   Collection Time: 01/31/16  9:06 PM  Result Value Ref Range   aPTT 29 24 - 37 seconds  CBC     Status: None   Collection Time: 01/31/16  9:06 PM  Result Value Ref Range   WBC 9.0 4.0 - 10.5 K/uL   RBC 4.64 4.22 - 5.81 MIL/uL    Hemoglobin 14.4 13.0 - 17.0 g/dL   HCT 43.4 39.0 - 52.0 %   MCV 93.5 78.0 - 100.0 fL   MCH 31.0 26.0 - 34.0 pg   MCHC 33.2 30.0 - 36.0 g/dL   RDW 13.5 11.5 - 15.5 %   Platelets 187 150 - 400 K/uL  Differential     Status: None   Collection Time: 01/31/16  9:06 PM  Result Value Ref Range   Neutrophils Relative % 73 %   Neutro Abs 6.5 1.7 - 7.7 K/uL   Lymphocytes Relative 17 %   Lymphs Abs 1.6 0.7 - 4.0 K/uL   Monocytes Relative 9 %   Monocytes Absolute 0.8 0.1 - 1.0 K/uL   Eosinophils Relative 1 %   Eosinophils Absolute 0.1 0.0 - 0.7 K/uL   Basophils Relative 0 %   Basophils Absolute 0.0 0.0 - 0.1 K/uL  Comprehensive metabolic panel     Status: Abnormal   Collection Time: 01/31/16  9:06 PM  Result Value Ref Range   Sodium 137 135 - 145 mmol/L   Potassium 3.8 3.5 - 5.1 mmol/L   Chloride 102 101 - 111 mmol/L   CO2 28 22 - 32 mmol/L   Glucose, Bld 107 (H) 65 - 99 mg/dL   BUN 12 6 - 20 mg/dL   Creatinine, Ser  0.91 0.61 - 1.24 mg/dL   Calcium 9.6 8.9 - 10.3 mg/dL   Total Protein 7.8 6.5 - 8.1 g/dL   Albumin 4.3 3.5 - 5.0 g/dL   AST 26 15 - 41 U/L   ALT 16 (L) 17 - 63 U/L   Alkaline Phosphatase 69 38 - 126 U/L   Total Bilirubin 1.2 0.3 - 1.2 mg/dL   GFR calc non Af Amer >60 >60 mL/min   GFR calc Af Amer >60 >60 mL/min    Comment: (NOTE) The eGFR has been calculated using the CKD EPI equation. This calculation has not been validated in all clinical situations. eGFR's persistently <60 mL/min signify possible Chronic Kidney Disease.    Anion gap 7 5 - 15  I-stat troponin, ED (not at [facility])     Status: None   Collection Time: 01/31/16  9:17 PM  Result Value Ref Range   Troponin i, poc 0.01 0.00 - 0.08 ng/mL   Comment 3            Comment: Due to the release kinetics of cTnI, a negative result within the first hours of the onset of symptoms does not rule out myocardial infarction with certainty. If myocardial infarction is still suspected, repeat the test at  appropriate intervals.   Urine rapid drug screen (hosp performed)not at [facility]     Status: None   Collection Time: 01/31/16  9:17 PM  Result Value Ref Range   Opiates NONE DETECTED NONE DETECTED   Cocaine NONE DETECTED NONE DETECTED   Benzodiazepines NONE DETECTED NONE DETECTED   Amphetamines NONE DETECTED NONE DETECTED   Tetrahydrocannabinol NONE DETECTED NONE DETECTED   Barbiturates NONE DETECTED NONE DETECTED    Comment:        DRUG SCREEN FOR MEDICAL PURPOSES ONLY.  IF CONFIRMATION IS NEEDED FOR ANY PURPOSE, NOTIFY LAB WITHIN 5 DAYS.        LOWEST DETECTABLE LIMITS FOR URINE DRUG SCREEN Drug Class       Cutoff (ng/mL) Amphetamine      1000 Barbiturate      200 Benzodiazepine   852 Tricyclics       778 Opiates          300 Cocaine          300 THC              50   I-Stat Chem 8, ED  (not at [facility])     Status: Abnormal   Collection Time: 01/31/16  9:19 PM  Result Value Ref Range   Sodium 139 135 - 145 mmol/L   Potassium 4.1 3.5 - 5.1 mmol/L   Chloride 100 (L) 101 - 111 mmol/L   BUN 15 6 - 20 mg/dL   Creatinine, Ser 0.90 0.61 - 1.24 mg/dL   Glucose, Bld 107 (H) 65 - 99 mg/dL   Calcium, Ion 1.14 1.12 - 1.23 mmol/L   TCO2 29 0 - 100 mmol/L   Hemoglobin 15.6 13.0 - 17.0 g/dL   HCT 46.0 39.0 - 52.0 %  Urinalysis, Routine w reflex microscopic (not at [facility])     Status: Abnormal   Collection Time: 01/31/16  9:19 PM  Result Value Ref Range   Color, Urine YELLOW YELLOW   APPearance CLEAR CLEAR   Specific Gravity, Urine 1.017 1.005 - 1.030   pH 7.5 5.0 - 8.0   Glucose, UA NEGATIVE NEGATIVE mg/dL   Hgb urine dipstick SMALL (A) NEGATIVE   Bilirubin Urine NEGATIVE NEGATIVE  Ketones, ur NEGATIVE NEGATIVE mg/dL   Protein, ur NEGATIVE NEGATIVE mg/dL   Nitrite NEGATIVE NEGATIVE   Leukocytes, UA NEGATIVE NEGATIVE  Urine microscopic-add on     Status: Abnormal   Collection Time: 01/31/16  9:19 PM  Result Value Ref Range   Squamous Epithelial / LPF 0-5 (A) NONE SEEN   WBC,  UA 0-5 0 - 5 WBC/hpf   RBC / HPF 6-30 0 - 5 RBC/hpf   Bacteria, UA FEW (A) NONE SEEN   Ct Angio Head W Or Wo Contrast  01/31/2016  CLINICAL DATA:  Subarachnoid hemorrhage. No known trauma. On Coumadin for aortic valve replacement. EXAM: CT ANGIOGRAPHY HEAD AND NECK TECHNIQUE: Multidetector CT imaging of the head and neck was performed using the standard protocol during bolus administration of intravenous contrast. Multiplanar CT image reconstructions and MIPs were obtained to evaluate the vascular anatomy. Carotid stenosis measurements (when applicable) are obtained utilizing NASCET criteria, using the distal internal carotid diameter as the denominator. CONTRAST:  50 mL Isovue 370 IV COMPARISON:  CT head 01/31/2016 FINDINGS: CTA NECK Aortic arch: Atherosclerotic calcification aortic arch without aneurysm or dissection. Atherosclerotic calcification proximal great vessels which are widely patent. Lung apices clear. Right carotid system: Right common carotid artery widely patent. Mild atherosclerotic calcification in the right carotid bifurcation without significant stenosis. Left carotid system: Left common carotid artery widely patent. Atherosclerotic calcification of the carotid bifurcation without significant stenosis. Vertebral arteries:Both vertebral arteries are patent to the basilar without significant vertebral stenosis. Skeleton: Moderately severe cervical spondylosis. No fracture or mass lesion. Other neck: Negative for mass or adenopathy in the neck. CTA HEAD Anterior circulation: Mild atherosclerotic calcification in the cavernous carotid artery bilaterally. No aneurysm in the cavernous carotid. Anterior and middle cerebral arteries patent bilaterally. 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 This is presumably the cause of the symmetric subarachnoid hemorrhage. Posterior circulation: Both vertebral arteries patent to the basilar. PICA patent. Basilar widely patent.  Superior cerebellar and posterior cerebral arteries patent bilaterally. 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 This is presumably the cause of acute subarachnoid hemorrhage. No other aneurysm. Mild atherosclerotic disease the carotid bifurcation bilaterally without significant carotid stenosis. No significant vertebral stenosis. These results were called by telephone at the time of interpretation on 01/31/2016 at 10:12 pm to Dr. Wallie Char , who verbally acknowledged these results. Electronically Signed   By: Franchot Gallo M.D.   On: 01/31/2016 22:14   Ct Head Wo Contrast  01/31/2016  CLINICAL DATA:  Sudden onset of headaches EXAM: CT HEAD WITHOUT CONTRAST TECHNIQUE: Contiguous axial images were obtained from the base of the skull through the vertex without intravenous contrast. COMPARISON:  None. FINDINGS: Bony calvarium is intact. There are changes consistent with subarachnoid hemorrhage along the distribution of the middle cerebral arteries and anterior cerebral arteries bilaterally. Given the patient's clinical history these changes are consistent with ruptured aneurysm. No intraventricular hemorrhage is noted. The basilar cisterns are not filled with blood. No mass lesion is noted. No acute infarct is seen. Mild atrophic changes are noted. IMPRESSION: Subarachnoid hemorrhage as described above. A definitive aneurysm is not noted on this exam. Critical Value/emergent results were called by telephone at the time of interpretation on 01/31/2016 at 9:28 pm to Dr. Nicole Kindred, who verbally acknowledged these results. Electronically Signed   By: Inez Catalina M.D.   On: 01/31/2016  21:30   Ct Angio Neck W Or Wo Contrast  01/31/2016  CLINICAL DATA:  Subarachnoid hemorrhage. No known trauma. On Coumadin for aortic valve replacement. EXAM: CT ANGIOGRAPHY HEAD AND NECK TECHNIQUE: Multidetector CT  imaging of the head and neck was performed using the standard protocol during bolus administration of intravenous contrast. Multiplanar CT image reconstructions and MIPs were obtained to evaluate the vascular anatomy. Carotid stenosis measurements (when applicable) are obtained utilizing NASCET criteria, using the distal internal carotid diameter as the denominator. CONTRAST:  50 mL Isovue 370 IV COMPARISON:  CT head 01/31/2016 FINDINGS: CTA NECK Aortic arch: Atherosclerotic calcification aortic arch without aneurysm or dissection. Atherosclerotic calcification proximal great vessels which are widely patent. Lung apices clear. Right carotid system: Right common carotid artery widely patent. Mild atherosclerotic calcification in the right carotid bifurcation without significant stenosis. Left carotid system: Left common carotid artery widely patent. Atherosclerotic calcification of the carotid bifurcation without significant stenosis. Vertebral arteries:Both vertebral arteries are patent to the basilar without significant vertebral stenosis. Skeleton: Moderately severe cervical spondylosis. No fracture or mass lesion. Other neck: Negative for mass or adenopathy in the neck. CTA HEAD Anterior circulation: Mild atherosclerotic calcification in the cavernous carotid artery bilaterally. No aneurysm in the cavernous carotid. Anterior and middle cerebral arteries patent bilaterally. 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 This is presumably the cause of the symmetric subarachnoid hemorrhage. Posterior circulation: Both vertebral arteries patent to the basilar. PICA patent. Basilar widely patent. Superior cerebellar and posterior cerebral arteries patent bilaterally. 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 This is presumably the cause of acute subarachnoid hemorrhage. No other  aneurysm. Mild atherosclerotic disease the carotid bifurcation bilaterally without significant carotid stenosis. No significant vertebral stenosis. These results were called by telephone at the time of interpretation on 01/31/2016 at 10:12 pm to Dr. Wallie Char , who verbally acknowledged these results. Electronically Signed   By: Franchot Gallo M.D.   On: 01/31/2016 22:14    Assessment: 82 y.o. male with atrial fibrillation on anticoagulation, hypertension and hyperlipidemia, presenting with acute subarachnoid hemorrhage most likely secondary to anterior communicating artery aneurysm rupture, as well as hypertensive urgency. Patient required emergency intervention for management of hypertension as well as reversal of anticoagulation.  Stroke Risk Factors - atrial fibrillation, family history, hyperlipidemia and hypertension  Plan: 1. Management of probable ACA aneurysm rupture per Neurosurgery 2. MRI of the brain without contrast 3. Echocardiogram 4. Prophylactic therapy-None 5. Risk factor modification 6. HgbA1c, fasting lipid panel  This patient is critically ill and at significant risk of neurological worsening or death, and care requires constant monitoring of vital signs, hemodynamics,respiratory and cardiac monitoring, neurological assessment, discussion with family, other specialists and medical decision making of high complexity. Total critical care time was 90 minutes.  C.R. Nicole Kindred, MD [facility] [phone] 01/31/2016, 10:17 PM

## 2016-01-31 NOTE — ED Notes (Signed)
Pt arrived to ED from [facility] urgent care, onset 6:30p headache (reports worse headache of life), nausea and HTN.  BP 220/89 and 220/109.  EKG showed A FIB rate 67, neuro intact.  No meds given by urgent care.

## 2016-01-31 NOTE — Progress Notes (Signed)
Code Stroke called on 82 y.o male with history of afib, HTN, hypercholesteremia. Presents to [facility] with acute head ache 10/10 pain and nausea, LSN 1830. Taken to CT scan STAT. Reviewed per Neurologist Dr. Nicole Kindred, showing acute subarachnoid hemorrhage. Patient has been on Coumadin for anticoagulation. INR was 1.8. NIHSS 0. Glucose 107. Patient for ICU admit, neurosurgery consult pending. Labetolol and Cardene gtt given for hypertension.  Code Stroke canceled per Dr. Nicole Kindred

## 2016-02-01 ENCOUNTER — Encounter (HOSPITAL_COMMUNITY): Payer: Self-pay | Admitting: *Deleted

## 2016-02-01 ENCOUNTER — Inpatient Hospital Stay (HOSPITAL_COMMUNITY): Payer: Medicare Other | Admitting: Certified Registered Nurse Anesthetist

## 2016-02-01 ENCOUNTER — Inpatient Hospital Stay (HOSPITAL_COMMUNITY): Payer: Medicare Other

## 2016-02-01 ENCOUNTER — Encounter (HOSPITAL_COMMUNITY)
Admission: EM | Disposition: A | Discharge status: Rehabilitation Facility | Payer: Self-pay | Source: Home / Self Care | Attending: Neurosurgery

## 2016-02-01 DIAGNOSIS — I609 Nontraumatic subarachnoid hemorrhage, unspecified: Secondary | ICD-10-CM

## 2016-02-01 DIAGNOSIS — I607 Nontraumatic subarachnoid hemorrhage from unspecified intracranial artery: Secondary | ICD-10-CM

## 2016-02-01 HISTORY — PX: RADIOLOGY WITH ANESTHESIA: SHX6223

## 2016-02-01 HISTORY — PX: IR GENERIC HISTORICAL: IMG1180011

## 2016-02-01 LAB — PROTIME-INR
INR: 1.17 (ref 0.00–1.49)
INR: 1.18 (ref 0.00–1.49)
INR: 1.18 (ref 0.00–1.49)
INR: 1.24 (ref 0.00–1.49)
PROTHROMBIN TIME: 15.2 s (ref 11.6–15.2)
Prothrombin Time: 15.8 seconds — ABNORMAL HIGH (ref 11.6–15.2)
Prothrombin Time: 15.1 seconds (ref 11.6–15.2)
Prothrombin Time: 15.2 seconds (ref 11.6–15.2)

## 2016-02-01 LAB — CBC
HCT: 35.5 % — ABNORMAL LOW (ref 39.0–52.0)
HEMOGLOBIN: 12.1 g/dL — AB (ref 13.0–17.0)
MCH: 31.6 pg (ref 26.0–34.0)
MCHC: 34.1 g/dL (ref 30.0–36.0)
MCV: 92.7 fL (ref 78.0–100.0)
Platelets: 153 10*3/uL (ref 150–400)
RBC: 3.83 MIL/uL — AB (ref 4.22–5.81)
RDW: 13.8 % (ref 11.5–15.5)
WBC: 8.2 10*3/uL (ref 4.0–10.5)

## 2016-02-01 LAB — APTT: APTT: 31 s (ref 24–37)

## 2016-02-01 LAB — MRSA PCR SCREENING: MRSA BY PCR: NEGATIVE

## 2016-02-01 SURGERY — RADIOLOGY WITH ANESTHESIA
Anesthesia: Monitor Anesthesia Care

## 2016-02-01 MED ORDER — PROPOFOL 10 MG/ML IV BOLUS
INTRAVENOUS | Status: DC | PRN
  Administered 2016-02-01: 130 mg via INTRAVENOUS

## 2016-02-01 MED ORDER — IOPAMIDOL (ISOVUE-300) INJECTION 61%
INTRAVENOUS | Status: AC
  Administered 2016-02-01: 75 mL
  Filled 2016-02-01: qty 150

## 2016-02-01 MED ORDER — ESMOLOL HCL 100 MG/10ML IV SOLN
INTRAVENOUS | Status: DC | PRN
  Administered 2016-02-01: 40 mg via INTRAVENOUS

## 2016-02-01 MED ORDER — LACTATED RINGERS IV SOLN
Freq: Once | INTRAVENOUS | Status: AC
  Administered 2016-02-01: 14:00:00 via INTRAVENOUS

## 2016-02-01 MED ORDER — ONDANSETRON HCL 4 MG/2ML IJ SOLN: 4.0000 mg | Freq: Once | INTRAMUSCULAR | Status: DC | PRN

## 2016-02-01 MED ORDER — HYDROMORPHONE HCL 1 MG/ML IJ SOLN: 0.5000 mg | INTRAMUSCULAR | Status: DC | PRN

## 2016-02-01 MED ORDER — HYDRALAZINE HCL 20 MG/ML IJ SOLN
INTRAMUSCULAR | Status: DC | PRN
  Administered 2016-02-01 (×4): 5 mg via INTRAVENOUS

## 2016-02-01 MED ORDER — PHENYLEPHRINE HCL 10 MG/ML IJ SOLN
10.0000 mg | INTRAVENOUS | Status: DC | PRN
  Administered 2016-02-01: 25 ug/min via INTRAVENOUS

## 2016-02-01 MED ORDER — ONDANSETRON HCL 4 MG/2ML IJ SOLN
INTRAMUSCULAR | Status: DC | PRN
  Administered 2016-02-01: 4 mg via INTRAVENOUS

## 2016-02-01 MED ORDER — MIDAZOLAM HCL 5 MG/5ML IJ SOLN
INTRAMUSCULAR | Status: DC | PRN
  Administered 2016-02-01: 1 mg via INTRAVENOUS

## 2016-02-01 MED ORDER — LIDOCAINE 2% (20 MG/ML) 5 ML SYRINGE
INTRAMUSCULAR | Status: DC | PRN
  Administered 2016-02-01: 40 mg via INTRAVENOUS

## 2016-02-01 MED ORDER — PROPOFOL 500 MG/50ML IV EMUL
INTRAVENOUS | Status: DC | PRN
  Administered 2016-02-01: 50 ug/kg/min via INTRAVENOUS

## 2016-02-01 MED ORDER — SUGAMMADEX SODIUM 200 MG/2ML IV SOLN
INTRAVENOUS | Status: DC | PRN
  Administered 2016-02-01: 200 mg via INTRAVENOUS

## 2016-02-01 MED ORDER — LIDOCAINE HCL 1 % IJ SOLN
INTRAMUSCULAR | Status: AC
  Administered 2016-02-01: 10 mL
  Filled 2016-02-01: qty 20

## 2016-02-01 MED ORDER — LACTATED RINGERS IV SOLN
INTRAVENOUS | Status: DC | PRN
  Administered 2016-02-01: 14:00:00 via INTRAVENOUS

## 2016-02-01 MED ORDER — FENTANYL CITRATE (PF) 100 MCG/2ML IJ SOLN
INTRAMUSCULAR | Status: DC | PRN
  Administered 2016-02-01: 50 ug via INTRAVENOUS
  Administered 2016-02-01: 25 ug via INTRAVENOUS
  Administered 2016-02-01 (×2): 50 ug via INTRAVENOUS
  Administered 2016-02-01: 25 ug via INTRAVENOUS

## 2016-02-01 MED ORDER — ROCURONIUM BROMIDE 100 MG/10ML IV SOLN
INTRAVENOUS | Status: DC | PRN
  Administered 2016-02-01: 50 mg via INTRAVENOUS

## 2016-02-01 MED ORDER — ASPIRIN 325 MG PO TABS
325.0000 mg | ORAL_TABLET | Freq: Every day | ORAL | Status: DC
  Administered 2016-02-01 – 2016-02-13 (×13): 325 mg via ORAL
  Filled 2016-02-01 (×14): qty 1

## 2016-02-01 NOTE — Transfer of Care (Signed)
Immediate Anesthesia Transfer of Care Note  Patient: Jason Hawkins  Procedure(s) Performed: Procedure(s): RADIOLOGY WITH ANESTHESIA (N/A)  Patient Location: PACU  Anesthesia Type:MAC and General  Level of Consciousness: awake and alert   Airway & Oxygen Therapy: Patient Spontanous Breathing and Patient connected to nasal cannula oxygen  Post-op Assessment: Report given to RN and Post -op Vital signs reviewed and stable  Post vital signs: Reviewed and stable  Last Vitals:  Filed Vitals:   02/01/16 1236 02/01/16 1300  BP:  126/67  Pulse:  69  Temp: 36.8 C   Resp:  24    Last Pain:  Filed Vitals:   02/01/16 1306  PainSc: Asleep         Complications: No apparent anesthesia complications

## 2016-02-01 NOTE — Progress Notes (Signed)
Pt transported to short stay by RN.

## 2016-02-01 NOTE — Progress Notes (Signed)
OT Cancellation Note  Patient Details Name: Jason Hawkins MRN: [id number] DOB: [birth year]   Cancelled Treatment:    Reason Eval/Treat Not Completed: Other (comment). Pt currently on bedrest, will see at a later time when activity orders are increased.  Almon Register [id number] 02/01/2016, 7:05 AM

## 2016-02-01 NOTE — Progress Notes (Signed)
Subjective: Patient reports headache improved  Objective: Vital signs in last 24 hours: Temp:  [97.9 F (36.6 C)-99.4 F (37.4 C)] 99.4 F (37.4 C) (06/28 0800) Pulse Rate:  [69-116] 73 (06/28 0800) Resp:  [14-26] 19 (06/28 0800) BP: (113-218)/(52-98) 138/57 mmHg (06/28 0800) SpO2:  [94 %-100 %] 97 % (06/28 0800) Weight:  [84.369 kg (186 lb)] 84.369 kg (186 lb) (06/27 2051)  Intake/Output from previous day: 06/27 0701 - 06/28 0700 In: 213.3 [I.V.:213.3] Out: 800 [Urine:800] Intake/Output this shift:    Physical Exam: Stable exam.  Minimal meningismus.  Lab Results:  Recent Labs  01/31/16 2106 01/31/16 2119 02/01/16 0715  WBC 9.0  --  8.2  HGB 14.4 15.6 12.1*  HCT 43.4 46.0 35.5*  PLT 187  --  153   BMET  Recent Labs  01/31/16 2106 01/31/16 2119  NA 137 139  K 3.8 4.1  CL 102 100*  CO2 28  --   GLUCOSE 107* 107*  BUN 12 15  CREATININE 0.91 0.90  CALCIUM 9.6  --     Studies/Results: Ct Angio Head W Or Wo Contrast  01/31/2016  CLINICAL DATA:  Subarachnoid hemorrhage. No known trauma. On Coumadin for aortic valve replacement. EXAM: CT ANGIOGRAPHY HEAD AND NECK TECHNIQUE: Multidetector CT imaging of the head and neck was performed using the standard protocol during bolus administration of intravenous contrast. Multiplanar CT image reconstructions and MIPs were obtained to evaluate the vascular anatomy. Carotid stenosis measurements (when applicable) are obtained utilizing NASCET criteria, using the distal internal carotid diameter as the denominator. CONTRAST:  50 mL Isovue 370 IV COMPARISON:  CT head 01/31/2016 FINDINGS: CTA NECK Aortic arch: Atherosclerotic calcification aortic arch without aneurysm or dissection. Atherosclerotic calcification proximal great vessels which are widely patent. Lung apices clear. Right carotid system: Right common carotid artery widely patent. Mild atherosclerotic calcification in the right carotid bifurcation without significant  stenosis. Left carotid system: Left common carotid artery widely patent. Atherosclerotic calcification of the carotid bifurcation without significant stenosis. Vertebral arteries:Both vertebral arteries are patent to the basilar without significant vertebral stenosis. Skeleton: Moderately severe cervical spondylosis. No fracture or mass lesion. Other neck: Negative for mass or adenopathy in the neck. CTA HEAD Anterior circulation: Mild atherosclerotic calcification in the cavernous carotid artery bilaterally. No aneurysm in the cavernous carotid. Anterior and middle cerebral arteries patent bilaterally. 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 This is presumably the cause of the symmetric subarachnoid hemorrhage. Posterior circulation: Both vertebral arteries patent to the basilar. PICA patent. Basilar widely patent. Superior cerebellar and posterior cerebral arteries patent bilaterally. 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 This is presumably the cause of acute subarachnoid hemorrhage. No other aneurysm. Mild atherosclerotic disease the carotid bifurcation bilaterally without significant carotid stenosis. No significant vertebral stenosis. These results were called by telephone at the time of interpretation on 01/31/2016 at 10:12 pm to Dr. Wallie Char , who verbally acknowledged these results. Electronically Signed   By: Franchot Gallo M.D.   On: 01/31/2016 22:14   Ct Head Wo Contrast  01/31/2016  CLINICAL DATA:  Sudden onset of headaches EXAM: CT HEAD WITHOUT CONTRAST TECHNIQUE: Contiguous axial images were obtained from the base of the skull through the vertex without intravenous contrast. COMPARISON:  None. FINDINGS: Bony calvarium is intact. There are changes consistent with subarachnoid hemorrhage along the distribution of the middle cerebral  arteries and  anterior cerebral arteries bilaterally. Given the patient's clinical history these changes are consistent with ruptured aneurysm. No intraventricular hemorrhage is noted. The basilar cisterns are not filled with blood. No mass lesion is noted. No acute infarct is seen. Mild atrophic changes are noted. IMPRESSION: Subarachnoid hemorrhage as described above. A definitive aneurysm is not noted on this exam. Critical Value/emergent results were called by telephone at the time of interpretation on 01/31/2016 at 9:28 pm to Dr. Nicole Kindred, who verbally acknowledged these results. Electronically Signed   By: Inez Catalina M.D.   On: 01/31/2016 21:30   Ct Angio Neck W Or Wo Contrast  01/31/2016  CLINICAL DATA:  Subarachnoid hemorrhage. No known trauma. On Coumadin for aortic valve replacement. EXAM: CT ANGIOGRAPHY HEAD AND NECK TECHNIQUE: Multidetector CT imaging of the head and neck was performed using the standard protocol during bolus administration of intravenous contrast. Multiplanar CT image reconstructions and MIPs were obtained to evaluate the vascular anatomy. Carotid stenosis measurements (when applicable) are obtained utilizing NASCET criteria, using the distal internal carotid diameter as the denominator. CONTRAST:  50 mL Isovue 370 IV COMPARISON:  CT head 01/31/2016 FINDINGS: CTA NECK Aortic arch: Atherosclerotic calcification aortic arch without aneurysm or dissection. Atherosclerotic calcification proximal great vessels which are widely patent. Lung apices clear. Right carotid system: Right common carotid artery widely patent. Mild atherosclerotic calcification in the right carotid bifurcation without significant stenosis. Left carotid system: Left common carotid artery widely patent. Atherosclerotic calcification of the carotid bifurcation without significant stenosis. Vertebral arteries:Both vertebral arteries are patent to the basilar without significant vertebral stenosis. Skeleton: Moderately severe  cervical spondylosis. No fracture or mass lesion. Other neck: Negative for mass or adenopathy in the neck. CTA HEAD Anterior circulation: Mild atherosclerotic calcification in the cavernous carotid artery bilaterally. No aneurysm in the cavernous carotid. Anterior and middle cerebral arteries patent bilaterally. 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 This is presumably the cause of the symmetric subarachnoid hemorrhage. Posterior circulation: Both vertebral arteries patent to the basilar. PICA patent. Basilar widely patent. Superior cerebellar and posterior cerebral arteries patent bilaterally. 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 This is presumably the cause of acute subarachnoid hemorrhage. No other aneurysm. Mild atherosclerotic disease the carotid bifurcation bilaterally without significant carotid stenosis. No significant vertebral stenosis. These results were called by telephone at the time of interpretation on 01/31/2016 at 10:12 pm to Dr. Wallie Char , who verbally acknowledged these results. Electronically Signed   By: Franchot Gallo M.D.   On: 01/31/2016 22:14    Assessment/Plan: Angio per Dr. Kathyrn Sheriff.  Difficult situation.  D/W Dr. Leonie Man.  He advocates need for continued coumadin, but problematic, given uncontrolled aneurysm and recent SAH.  Currently INR 1.2 after correction, which is acceptable.    LOS: 1 day    Peggyann Shoals, MD 02/01/2016, 9:06 AM

## 2016-02-01 NOTE — Anesthesia Procedure Notes (Signed)
Procedure Name: Intubation Date/Time: 02/01/2016 4:09 PM Performed by: Bethel Born Pre-anesthesia Checklist: Patient identified, Emergency Drugs available, Suction available, Patient being monitored and Timeout performed Patient Re-evaluated:Patient Re-evaluated prior to inductionOxygen Delivery Method: Circle system utilized Preoxygenation: Pre-oxygenation with 100% oxygen Intubation Type: IV induction Ventilation: Mask ventilation without difficulty and Oral airway inserted - appropriate to patient size Laryngoscope Size: Mac and 3 Grade View: Grade III Tube type: Oral Tube size: 7.5 mm Number of attempts: 1 Airway Equipment and Method: Stylet Placement Confirmation: ETT inserted through vocal cords under direct vision,  positive ETCO2 and breath sounds checked- equal and bilateral Secured at: 23 cm Tube secured with: Tape Dental Injury: Teeth and Oropharynx as per pre-operative assessment

## 2016-02-01 NOTE — Progress Notes (Signed)
14 Fr foley cath inserted with clear yellow urine retun

## 2016-02-01 NOTE — Progress Notes (Signed)
Pt seen and examined. History reviewed. Pt had sudden onset of severe HA with nausea last evening at 6p. Minimal photophobia. No visual changes, N/T/W. No known FH of aneurysms, has hx of controlled HTN, quit smoking >64yrs ago.  EXAM: Temp:  [97.9 F (36.6 C)-99.4 F (37.4 C)] 98.3 F (36.8 C) (06/28 1236) Pulse Rate:  [57-116] 69 (06/28 1300) Resp:  [14-29] 24 (06/28 1300) BP: (113-218)/(52-98) 126/67 mmHg (06/28 1300) SpO2:  [94 %-100 %] 95 % (06/28 1300) Weight:  [84.369 kg (186 lb)] 84.369 kg (186 lb) (06/27 2051) Intake/Output      06/27 0701 - 06/28 0700 06/28 0701 - 06/29 0700   I.V. (mL/kg) 213.3 (2.5)    Total Intake(mL/kg) 213.3 (2.5)    Urine (mL/kg/hr) 800 475 (0.6)   Stool  0 (0)   Total Output 800 475   Net -586.7 -475        Urine Occurrence  2 x   Stool Occurrence  1 x    Awake, alert, oriented Speech fluent, appropriate CN intact Good strength throughout  LABS: Lab Results  Component Value Date   CREATININE 0.90 01/31/2016   BUN 15 01/31/2016   NA 139 01/31/2016   K 4.1 01/31/2016   CL 100* 01/31/2016   CO2 28 01/31/2016   Lab Results  Component Value Date   WBC 8.2 02/01/2016   HGB 12.1* 02/01/2016   HCT 35.5* 02/01/2016   MCV 92.7 02/01/2016   PLT 153 02/01/2016    IMAGING: CT demonstrates diffuse SAH, CTA demonstrates superiorly and leftward projecting small Acom aneurysm  IMPRESSION: - 82 y.o. 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  I did review the situation with the patient and his son. Given his age, I would not recommend surgical clipping. However, if possible, primary coiling is reasonable. If the aneurysm is not coilable, we may elect not to treat at all. We did review risks of the procedure, as well as risk of rerupture if we cannot treat. All questions were answered. They are willing to proceed as above.

## 2016-02-01 NOTE — Care Management Important Message (Signed)
Important Message  Patient Details  Name: Jason Hawkins MRN: [id number] Date of Birth: October 18, 1927   Medicare Important Message Given:  Yes    Loann Quill 02/01/2016, 10:05 AM

## 2016-02-01 NOTE — Progress Notes (Signed)
Transcranial Doppler  Date POD PCO2 HCT BP  MCA ACA PCA OPHT SIPH VERT Basilar  02/01/16 MS RS   35.5 138/57 Right  Left   53  55   -66  -41   39  40   20  18   -45  48   -45  -37   -24           Right  Left                                            Right  Left                                             Right  Left                                             Right  Left                                            Right  Left                                            Right  Left                                        MCA = Middle Cerebral Artery      OPHT = Opthalmic Artery     BASILAR = Basilar Artery   ACA = Anterior Cerebral Artery     SIPH = Carotid Siphon PCA = Posterior Cerebral Artery   VERT = Verterbral Artery                   Normal MCA = 62+\-12 ACA = 50+\-12 PCA = 42+\-23      02/01/2016 12:13 PM Maudry Mayhew, RVT, RDCS, RDMS  Velva Harman Sturdivant, BS, RVT, RDMS

## 2016-02-01 NOTE — Anesthesia Postprocedure Evaluation (Signed)
Anesthesia Post Note  Patient: Jason Hawkins  Procedure(s) Performed: Procedure(s) (LRB): RADIOLOGY WITH ANESTHESIA (N/A)  Patient location during evaluation: PACU Anesthesia Type: General Level of consciousness: awake and alert Pain management: pain level controlled Vital Signs Assessment: post-procedure vital signs reviewed and stable Respiratory status: spontaneous breathing, nonlabored ventilation, respiratory function stable and patient connected to nasal cannula oxygen Cardiovascular status: blood pressure returned to baseline and stable Postop Assessment: no signs of nausea or vomiting Anesthetic complications: no    Last Vitals:  Filed Vitals:   02/01/16 2045 02/01/16 2100  BP: 125/61   Pulse: 77 84  Temp:    Resp: 16 16    Last Pain:  Filed Vitals:   02/01/16 2100  PainSc: Asleep                 Bianco Cange,W. EDMOND

## 2016-02-01 NOTE — Progress Notes (Signed)
STROKE TEAM PROGRESS NOTE   HISTORY OF PRESENT ILLNESS (per record) Jason Hawkins is an 82 y.o. male with a history of atrial fibrillation, aortic valve replacement, hypertension, hypercholesterolemia and lower extremity edema brought to the emergency room following sudden onset of severe frontal headache. Recent subsequently became nauseated as well. No focal deficits were noted. Patient has been on Coumadin for anticoagulation. INR was 1.8. CT scan of the head showed acute subarachnoid hemorrhage along the anterior and middle cerebral arteries. No clear aneurysm was seen. Cerebral angiogram showed a 1.5 x 3 mm near result the anterior communicating artery on the right, presenting to be source of acute subarachnoid hemorrhage. Blood pressure was elevated in the emergency room with systolic blood pressure greater than 200 including after being given IV labetalol. Cardene drip was initiated. Reversal of anticoagulation with vitamin K and Kcentra was also initiated. Neurosurgery was consulted. Patient was LKW at 6:30 PM on 01/31/2016. Patient was not administered IV t-PA secondary to [facility]. He was admitted to the neuro ICU for further evaluation and treatment.   SUBJECTIVE (INTERVAL HISTORY) His son is at the bedside.  Overall he feels his condition is stable. Dr. Vertell Limber seeing patient, to hand over to Dr. Kathyrn Sheriff. No family hx of aneurysms. Plan for angio today to look for aneurysm - and decide on tx course. Dr. Theresia Lo discussed with pt, son and daughter-in-law.   OBJECTIVE Temp:  [97.9 F (36.6 C)-99.4 F (37.4 C)] 99.4 F (37.4 C) (06/28 0800) Pulse Rate:  [69-116] 73 (06/28 0800) Cardiac Rhythm:  [-]  Resp:  [14-26] 19 (06/28 0800) BP: (113-218)/(52-98) 138/57 mmHg (06/28 0800) SpO2:  [94 %-100 %] 97 % (06/28 0800) Weight:  [84.369 kg (186 lb)] 84.369 kg (186 lb) (06/27 2051)  CBC:   Recent Labs Lab 01/31/16 2106 01/31/16 2119  WBC 9.0  --   NEUTROABS 6.5  --   HGB 14.4 15.6  HCT 43.4  46.0  MCV 93.5  --   PLT 187  --     Basic Metabolic Panel:   Recent Labs Lab 01/31/16 2106 01/31/16 2119  NA 137 139  K 3.8 4.1  CL 102 100*  CO2 28  --   GLUCOSE 107* 107*  BUN 12 15  CREATININE 0.91 0.90  CALCIUM 9.6  --     Lipid Panel:     Component Value Date/Time   CHOL 214* 01/04/2016 1106   TRIG 93.0 01/04/2016 1106   HDL 60.50 01/04/2016 1106   CHOLHDL 4 01/04/2016 1106   VLDL 18.6 01/04/2016 1106   LDLCALC 134* 01/04/2016 1106   HgbA1c: No results found for: HGBA1C Urine Drug Screen:     Component Value Date/Time   LABOPIA NONE DETECTED 01/31/2016 2117   COCAINSCRNUR NONE DETECTED 01/31/2016 2117   LABBENZ NONE DETECTED 01/31/2016 2117   AMPHETMU NONE DETECTED 01/31/2016 2117   THCU NONE DETECTED 01/31/2016 2117   LABBARB NONE DETECTED 01/31/2016 2117      IMAGING  Ct Angio Head W Or Wo Contrast  01/31/2016  CLINICAL DATA:  Subarachnoid hemorrhage. No known trauma. On Coumadin for aortic valve replacement. EXAM: CT ANGIOGRAPHY HEAD AND NECK TECHNIQUE: Multidetector CT imaging of the head and neck was performed using the standard protocol during bolus administration of intravenous contrast. Multiplanar CT image reconstructions and MIPs were obtained to evaluate the vascular anatomy. Carotid stenosis measurements (when applicable) are obtained utilizing NASCET criteria, using the distal internal carotid diameter as the denominator. CONTRAST:  50 mL Isovue 370 IV  COMPARISON:  CT head 01/31/2016 FINDINGS: CTA NECK Aortic arch: Atherosclerotic calcification aortic arch without aneurysm or dissection. Atherosclerotic calcification proximal great vessels which are widely patent. Lung apices clear. Right carotid system: Right common carotid artery widely patent. Mild atherosclerotic calcification in the right carotid bifurcation without significant stenosis. Left carotid system: Left common carotid artery widely patent. Atherosclerotic calcification of the carotid  bifurcation without significant stenosis. Vertebral arteries:Both vertebral arteries are patent to the basilar without significant vertebral stenosis. Skeleton: Moderately severe cervical spondylosis. No fracture or mass lesion. Other neck: Negative for mass or adenopathy in the neck. CTA HEAD Anterior circulation: Mild atherosclerotic calcification in the cavernous carotid artery bilaterally. No aneurysm in the cavernous carotid. Anterior and middle cerebral arteries patent bilaterally. 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 This is presumably the cause of the symmetric subarachnoid hemorrhage. Posterior circulation: Both vertebral arteries patent to the basilar. PICA patent. Basilar widely patent. Superior cerebellar and posterior cerebral arteries patent bilaterally. 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 This is presumably the cause of acute subarachnoid hemorrhage. No other aneurysm. Mild atherosclerotic disease the carotid bifurcation bilaterally without significant carotid stenosis. No significant vertebral stenosis. These results were called by telephone at the time of interpretation on 01/31/2016 at 10:12 pm to Dr. Wallie Char , who verbally acknowledged these results. Electronically Signed   By: Franchot Gallo M.D.   On: 01/31/2016 22:14   Ct Head Wo Contrast  01/31/2016  CLINICAL DATA:  Sudden onset of headaches EXAM: CT HEAD WITHOUT CONTRAST TECHNIQUE: Contiguous axial images were obtained from the base of the skull through the vertex without intravenous contrast. COMPARISON:  None. FINDINGS: Bony calvarium is intact. There are changes consistent with subarachnoid hemorrhage along the distribution of the middle cerebral arteries and anterior cerebral arteries bilaterally. Given the patient's clinical history these changes are consistent with  ruptured aneurysm. No intraventricular hemorrhage is noted. The basilar cisterns are not filled with blood. No mass lesion is noted. No acute infarct is seen. Mild atrophic changes are noted. IMPRESSION: Subarachnoid hemorrhage as described above. A definitive aneurysm is not noted on this exam. Critical Value/emergent results were called by telephone at the time of interpretation on 01/31/2016 at 9:28 pm to Dr. Nicole Kindred, who verbally acknowledged these results. Electronically Signed   By: Inez Catalina M.D.   On: 01/31/2016 21:30   Ct Angio Neck W Or Wo Contrast  01/31/2016  CLINICAL DATA:  Subarachnoid hemorrhage. No known trauma. On Coumadin for aortic valve replacement. EXAM: CT ANGIOGRAPHY HEAD AND NECK TECHNIQUE: Multidetector CT imaging of the head and neck was performed using the standard protocol during bolus administration of intravenous contrast. Multiplanar CT image reconstructions and MIPs were obtained to evaluate the vascular anatomy. Carotid stenosis measurements (when applicable) are obtained utilizing NASCET criteria, using the distal internal carotid diameter as the denominator. CONTRAST:  50 mL Isovue 370 IV COMPARISON:  CT head 01/31/2016 FINDINGS: CTA NECK Aortic arch: Atherosclerotic calcification aortic arch without aneurysm or dissection. Atherosclerotic calcification proximal great vessels which are widely patent. Lung apices clear. Right carotid system: Right common carotid artery widely patent. Mild atherosclerotic calcification in the right carotid bifurcation without significant stenosis. Left carotid system: Left common carotid artery widely patent. Atherosclerotic calcification of the carotid bifurcation without significant stenosis. Vertebral arteries:Both vertebral arteries are patent to the basilar without significant vertebral stenosis. Skeleton:  Moderately severe cervical spondylosis. No fracture or mass lesion. Other neck: Negative for mass or adenopathy in the neck. CTA HEAD  Anterior circulation: Mild atherosclerotic calcification in the cavernous carotid artery bilaterally. No aneurysm in the cavernous carotid. Anterior and middle cerebral arteries patent bilaterally. 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 This is presumably the cause of the symmetric subarachnoid hemorrhage. Posterior circulation: Both vertebral arteries patent to the basilar. PICA patent. Basilar widely patent. Superior cerebellar and posterior cerebral arteries patent bilaterally. 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 This is presumably the cause of acute subarachnoid hemorrhage. No other aneurysm. Mild atherosclerotic disease the carotid bifurcation bilaterally without significant carotid stenosis. No significant vertebral stenosis. These results were called by telephone at the time of interpretation on 01/31/2016 at 10:12 pm to Dr. Wallie Char , who verbally acknowledged these results. Electronically Signed   By: Franchot Gallo M.D.   On: 01/31/2016 22:14       PHYSICAL EXAM Pleasant elderly Caucasian male currently not in distress. . Afebrile. Head is nontraumatic. Neck is supple without bruit.    Cardiac exam no murmur or gallop. Lungs are clear to auscultation. Distal pulses are well felt. Neurological Exam ;  Awake  Alert oriented x 3. Normal speech and language.eye movements full without nystagmus.fundi were not visualized. Vision acuity and fields appear normal. Hearing is normal. Palatal movements are normal. Face symmetric. Tongue midline. Normal strength, tone, reflexes and coordination. Normal sensation. Gait deferred. ASSESSMENT/PLAN Mr. Denarius Szekely is a 82 y.o. male with history of atrial fibrillation on coumadin, aortic valve replacement, hypertension, hypercholesterolemia and lower extremity edema presenting with sudden onset severe  HA. Blood pressure elevated. CT showed SAH.   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 Mild atherosclerotic disease.  TCD pending   On nimodipine  Plan angio today by Dr. Kathyrn Sheriff  If Venda Rodes confirmed, will determine tx course. If IR, will need aspirin and plavix along with anticoagulation which is a bleeding risk. Recommend aspirin and plavix only in this scenario with NOAC in future. Will discuss further pending findings.  SCDs for VTE prophylaxis Diet NPO time specified  warfarin daily prior to admission. INR 1.8. Reversal received  Therapy recommendations:  pending   Disposition:  pending   Atrial Fibrillation  Home anticoagulation:  warfarin daily   INR 1.8 on admission    Hypertensive Emergency  BP 220/109 on admission in setting of neurologic symptoms  Started on cardene drip, now off  BP Stable  Other Stroke Risk Factors  Advanced age  ETOH use, advised to drink no more than 2 drink(s) a day  Severe aortic stenosis s/p AVR 2005  Hospital day # [place] for Pager information 02/01/2016 9:07 AM  I have personally examined this patient, reviewed notes, independently viewed imaging studies, participated in medical decision making and plan of care. I have made any additions or clarifications directly to the above note. Agree with note above. This is a complex medical situation. 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 He is at risk for recurrent hemorrhage  till aneurysm is treated but will remain at risk for strokes while he  is off anticoagulation. I discussed this in detail with the patient and family and answered questions. He may need to antiplatelet therapy after endovascular aneurysm treatment and addition of  anticoagulation would put him at high risk for bleeding. Discuss with Dr. Kathyrn Sheriff and Vertell Limber. Plan cerebral catheter angiogram today and likely endovascular treatment later . This patient is critically ill and at significant risk of neurological worsening, death and care requires constant monitoring of vital signs, hemodynamics,respiratory and cardiac monitoring, extensive review of multiple databases, frequent neurological assessment, discussion with family, other specialists and medical decision making of high complexity.I have made any additions or clarifications directly to the above note.This critical care time does not reflect procedure time, or teaching time or supervisory time of PA/NP/Med Resident etc but could involve care discussion time.  I spent 50 minutes of neurocritical care time  in the care of  this patient.      Antony Contras, MD Medical Director [place] Pager: [phone] 02/01/2016 1:27 PM    To contact Stroke Continuity provider, please refer to [email or web address] After hours, contact General Neurology

## 2016-02-01 NOTE — Progress Notes (Signed)
SLP Cancellation Note  Patient Details Name: Jason Hawkins MRN: [id number] DOB: [birth year]   Cancelled treatment:        Pt passed RN swallow screen and RN reports no difficulty (order may have been written not knowing he passed screen). No eval needed.   Houston Siren 02/01/2016, 9:42 AM   Orbie Pyo Colvin Caroli.Ed [employer] [phone]

## 2016-02-01 NOTE — Progress Notes (Signed)
Report called to [facility] in Short Stay.

## 2016-02-01 NOTE — Anesthesia Preprocedure Evaluation (Signed)
Anesthesia Evaluation  Patient identified by MRN, date of birth, ID band Patient awake    Reviewed: Allergy & Precautions, NPO status , reviewed documented beta blocker date and time   Airway Mallampati: I  TM Distance: >3 FB     Dental   Pulmonary former smoker,    Pulmonary exam normal        Cardiovascular hypertension, + Peripheral Vascular Disease  + dysrhythmias Atrial Fibrillation + Valvular Problems/Murmurs AS  Rhythm:Irregular Rate:Abnormal     Neuro/Psych CVA    GI/Hepatic   Endo/Other    Renal/GU      Musculoskeletal   Abdominal   Peds  Hematology   Anesthesia Other Findings   Reproductive/Obstetrics                             Anesthesia Physical Anesthesia Plan  ASA: III  Anesthesia Plan: MAC and General   Post-op Pain Management:    Induction: Intravenous  Airway Management Planned: Mask and Oral ETT  Additional Equipment: Arterial line  Intra-op Plan:   Post-operative Plan: Extubation in OR  Informed Consent: I have reviewed the patients History and Physical, chart, labs and discussed the procedure including the risks, benefits and alternatives for the proposed anesthesia with the patient or authorized representative who has indicated his/her understanding and acceptance.     Plan Discussed with: CRNA, Surgeon and Anesthesiologist  Anesthesia Plan Comments:         Anesthesia Quick Evaluation

## 2016-02-01 NOTE — Progress Notes (Signed)
PT Cancellation Note  Patient Details Name: Jason Hawkins MRN: [id number] DOB: [birth year]   Cancelled Treatment:      Reason Eval/Treat Not Completed: Other (comment). Pt currently on bedrest, will see at a later time when activity orders are increased   Duncan Dull 02/01/2016, 7:38 AM Alben Deeds, PT DPT  [phone]

## 2016-02-02 ENCOUNTER — Encounter (HOSPITAL_COMMUNITY): Payer: Self-pay | Admitting: Neurosurgery

## 2016-02-02 LAB — PROTIME-INR
INR: 1.25 (ref 0.00–1.49)
Prothrombin Time: 15.8 seconds — ABNORMAL HIGH (ref 11.6–15.2)

## 2016-02-02 MED ORDER — AMLODIPINE BESYLATE 2.5 MG PO TABS
2.5000 mg | ORAL_TABLET | Freq: Every day | ORAL | Status: DC
  Administered 2016-02-02 – 2016-02-13 (×12): 2.5 mg via ORAL
  Filled 2016-02-02 (×12): qty 1

## 2016-02-02 NOTE — Progress Notes (Signed)
STROKE TEAM PROGRESS NOTE   SUBJECTIVE (INTERVAL HISTORY) His son, daughter-in-law and RN are at the bedside. He is lying in the bed talkative, joking. He is A&Ox3. He had successful coiling of the small she, aneurysm yesterday. Transcranial Doppler studies yesterday showed no vasospasm.   OBJECTIVE Temp:  [98 F (36.7 C)-99.8 F (37.7 C)] 99.6 F (37.6 C) (06/29 0400) Pulse Rate:  [58-89] 58 (06/29 0800) Cardiac Rhythm:  [-] Atrial fibrillation (06/29 0800) Resp:  [15-29] 20 (06/29 0800) BP: (105-150)/(45-95) 119/45 mmHg (06/29 0800) SpO2:  [90 %-99 %] 93 % (06/29 0800) Arterial Line BP: (140-157)/(49-54) 144/49 mmHg (06/28 1854)  CBC:   Recent Labs Lab 01/31/16 2106 01/31/16 2119 02/01/16 0715  WBC 9.0  --  8.2  NEUTROABS 6.5  --   --   HGB 14.4 15.6 12.1*  HCT 43.4 46.0 35.5*  MCV 93.5  --  92.7  PLT 187  --  0000000    Basic Metabolic Panel:   Recent Labs Lab 01/31/16 2106 01/31/16 2119  NA 137 139  K 3.8 4.1  CL 102 100*  CO2 28  --   GLUCOSE 107* 107*  BUN 12 15  CREATININE 0.91 0.90  CALCIUM 9.6  --     Lipid Panel:     Component Value Date/Time   CHOL 214* 01/04/2016 1106   TRIG 93.0 01/04/2016 1106   HDL 60.50 01/04/2016 1106   CHOLHDL 4 01/04/2016 1106   VLDL 18.6 01/04/2016 1106   LDLCALC 134* 01/04/2016 1106   HgbA1c: No results found for: HGBA1C Urine Drug Screen:     Component Value Date/Time   LABOPIA NONE DETECTED 01/31/2016 2117   COCAINSCRNUR NONE DETECTED 01/31/2016 2117   LABBENZ NONE DETECTED 01/31/2016 2117   AMPHETMU NONE DETECTED 01/31/2016 2117   THCU NONE DETECTED 01/31/2016 2117   LABBARB NONE DETECTED 01/31/2016 2117      IMAGING  Ct Angio Head W Or Wo Contrast  01/31/2016  CLINICAL DATA:  Subarachnoid hemorrhage. No known trauma. On Coumadin for aortic valve replacement. EXAM: CT ANGIOGRAPHY HEAD AND NECK TECHNIQUE: Multidetector CT imaging of the head and neck was performed using the standard protocol during bolus  administration of intravenous contrast. Multiplanar CT image reconstructions and MIPs were obtained to evaluate the vascular anatomy. Carotid stenosis measurements (when applicable) are obtained utilizing NASCET criteria, using the distal internal carotid diameter as the denominator. CONTRAST:  50 mL Isovue 370 IV COMPARISON:  CT head 01/31/2016 FINDINGS: CTA NECK Aortic arch: Atherosclerotic calcification aortic arch without aneurysm or dissection. Atherosclerotic calcification proximal great vessels which are widely patent. Lung apices clear. Right carotid system: Right common carotid artery widely patent. Mild atherosclerotic calcification in the right carotid bifurcation without significant stenosis. Left carotid system: Left common carotid artery widely patent. Atherosclerotic calcification of the carotid bifurcation without significant stenosis. Vertebral arteries:Both vertebral arteries are patent to the basilar without significant vertebral stenosis. Skeleton: Moderately severe cervical spondylosis. No fracture or mass lesion. Other neck: Negative for mass or adenopathy in the neck. CTA HEAD Anterior circulation: Mild atherosclerotic calcification in the cavernous carotid artery bilaterally. No aneurysm in the cavernous carotid. Anterior and middle cerebral arteries patent bilaterally. 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 This is presumably the cause of the symmetric subarachnoid hemorrhage. Posterior circulation: Both vertebral arteries patent to the basilar. PICA patent. Basilar widely patent. Superior cerebellar and posterior cerebral arteries patent bilaterally. 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 This is presumably the cause of acute subarachnoid hemorrhage. No other aneurysm. Mild atherosclerotic disease the carotid bifurcation bilaterally without  significant carotid stenosis. No significant vertebral stenosis. These results were called by telephone at the time of interpretation on 01/31/2016 at 10:12 pm to Dr. Wallie Char , who verbally acknowledged these results. Electronically Signed   By: Franchot Gallo M.D.   On: 01/31/2016 22:14   Ct Head Wo Contrast  01/31/2016  CLINICAL DATA:  Sudden onset of headaches EXAM: CT HEAD WITHOUT CONTRAST TECHNIQUE: Contiguous axial images were obtained from the base of the skull through the vertex without intravenous contrast. COMPARISON:  None. FINDINGS: Bony calvarium is intact. There are changes consistent with subarachnoid hemorrhage along the distribution of the middle cerebral arteries and anterior cerebral arteries bilaterally. Given the patient's clinical history these changes are consistent with ruptured aneurysm. No intraventricular hemorrhage is noted. The basilar cisterns are not filled with blood. No mass lesion is noted. No acute infarct is seen. Mild atrophic changes are noted. IMPRESSION: Subarachnoid hemorrhage as described above. A definitive aneurysm is not noted on this exam. Critical Value/emergent results were called by telephone at the time of interpretation on 01/31/2016 at 9:28 pm to Dr. Nicole Kindred, who verbally acknowledged these results. Electronically Signed   By: Inez Catalina M.D.   On: 01/31/2016 21:30   Ct Angio Neck W Or Wo Contrast  01/31/2016  CLINICAL DATA:  Subarachnoid hemorrhage. No known trauma. On Coumadin for aortic valve replacement. EXAM: CT ANGIOGRAPHY HEAD AND NECK TECHNIQUE: Multidetector CT imaging of the head and neck was performed using the standard protocol during bolus administration of intravenous contrast. Multiplanar CT image reconstructions and MIPs were obtained to evaluate the vascular anatomy. Carotid stenosis measurements (when applicable) are obtained utilizing NASCET criteria, using the distal internal carotid diameter as the denominator. CONTRAST:  50 mL  Isovue 370 IV COMPARISON:  CT head 01/31/2016 FINDINGS: CTA NECK Aortic arch: Atherosclerotic calcification aortic arch without aneurysm or dissection. Atherosclerotic calcification proximal great vessels which are widely patent. Lung apices clear. Right carotid system: Right common carotid artery widely patent. Mild atherosclerotic calcification in the right carotid bifurcation without significant stenosis. Left carotid system: Left common carotid artery widely patent. Atherosclerotic calcification of the carotid bifurcation without significant stenosis. Vertebral arteries:Both vertebral arteries are patent to the basilar without significant vertebral stenosis. Skeleton: Moderately severe cervical spondylosis. No fracture or mass lesion. Other neck: Negative for mass or adenopathy in the neck. CTA HEAD Anterior circulation: Mild atherosclerotic calcification in the cavernous carotid artery bilaterally. No aneurysm in the cavernous carotid. Anterior and middle cerebral arteries patent bilaterally. 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 This is presumably the cause of the symmetric subarachnoid hemorrhage. Posterior circulation: Both vertebral arteries patent to the basilar. PICA patent. Basilar widely patent. Superior cerebellar and posterior cerebral arteries patent bilaterally. 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 This is presumably the cause of acute subarachnoid hemorrhage. No other aneurysm. Mild atherosclerotic disease the carotid bifurcation bilaterally without significant carotid stenosis. No significant vertebral stenosis. These results were called by telephone at the time of interpretation on 01/31/2016 at 10:12 pm to Dr. Wallie Char , who verbally acknowledged these results. Electronically Signed   By: Franchot Gallo M.D.   On: 01/31/2016 22:14  PHYSICAL EXAM Pleasant elderly Caucasian male currently not in distress. . Afebrile. Head is nontraumatic. Neck is supple without bruit.    Cardiac exam no murmur or gallop. Lungs are clear to auscultation. Distal pulses are well felt. Neurological Exam ;  Awake  Alert oriented x 3. Normal speech and language.eye movements full without nystagmus.fundi were not visualized. Vision acuity and fields appear normal. Hearing is normal. Palatal movements are normal. Face symmetric. Tongue midline. Normal strength, tone, reflexes and coordination. Normal sensation. Gait deferred.   ASSESSMENT/PLAN Jason Hawkins is a 82 y.o. male with history of atrial fibrillation on coumadin, aortic valve replacement, hypertension, hypercholesterolemia and lower extremity edema presenting with sudden onset severe HA. Blood pressure elevated. CT showed SAH.   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 Mild atherosclerotic disease.  Angio confirmed ACOM aneurysm which was subsequently coiled without stent placement. Add aspirin in a few days and NOAC in 1 week.  TCDs MWF  On nimodipine  SCDs for VTE prophylaxis Diet Heart Room service appropriate?: Yes; Fluid consistency:: Thin  warfarin daily prior to admission. INR 1.8. Reversal received  Therapy recommendations:  pending . Activity per NS/attending. Ok to be OOB from stroke standpoint  Disposition:  pending   Discontinue foley  Disontinue aline  Atrial Fibrillation  Home anticoagulation:  warfarin daily   INR 1.8 on admission  Received reversal agent  Plan to add aspirin in a few days and transition to NOA in 1 week    Hypertensive Emergency  BP 220/109 on admission in setting of neurologic symptoms  Started on cardene drip, now off  BP Stable  SBP goal <  160  Start on norvasc 2.5 mg daily.  On nimondipine for [facility] which will also lower BP  monitor  Other Stroke Risk Factors  Advanced  age  ETOH use, advised to drink no more than 2 drink(s) a day  Severe aortic stenosis s/p AVR 2005  Hospital day # Margaret for Pager information 02/02/2016 9:08 AM   I have personally examined this patient, reviewed notes, independently viewed imaging studies, participated in medical decision making and plan of care. I have made any additions or clarifications directly to the above note. Agree with note above. The patient has had successful coiling of his aneurysm. Recommend starting aspirin since a portion of the coils is protruding into the lumen. Repeat CT scan of the head tomorrow morning. Continue on amlodipine. Add norvasc for hupertension This patient is critically ill and at significant risk of neurological worsening, death and care requires constant monitoring of vital signs, hemodynamics,respiratory and cardiac monitoring, extensive review of multiple databases, frequent neurological assessment, discussion with family, other specialists and medical decision making of high complexity.I have made any additions or clarifications directly to the above note.This critical care time does not reflect procedure time, or teaching time or supervisory time of PA/NP/Med Resident etc but could involve care discussion time.  I spent 30 minutes of neurocritical care time  in the care of  this patient.      Antony Contras, MD Medical Director [facility] Pager: [phone] 02/02/2016 2:58 PM   To contact Stroke Continuity provider, please refer to [email or web address] After hours, contact General Neurology

## 2016-02-02 NOTE — Evaluation (Signed)
Physical Therapy Evaluation Patient Details Name: Jason Hawkins MRN: [id number] DOB: 02/12/1928 Today's Date: 02/02/2016   History of Present Illness  82 y.o. male with history of atrial fibrillation on coumadin, aortic valve replacement, hypertension, hypercholesterolemia and lower extremity edema presenting with sudden onset severe HA. Blood pressure elevated. CT showed SAH  Clinical Impression  Patient demonstrates deficits in functional mobility as indicated below. Will need continued skilled PT to address deficits and maximize function. Will see as indicated and progress as tolerated. OF NOTE: Patient with fall while here in hospital resulting in abrasion to back, patient unsteady with ambulation, at this time would recommend initial 24/7 supervision and HHPT to perform safety assessment in home. Will follow acutely.    Follow Up Recommendations Home health PT;Supervision/Assistance - 24 hour    Equipment Recommendations   (TBD)    Recommendations for Other Services       Precautions / Restrictions Precautions Precautions: Fall      Mobility  Bed Mobility Overal bed mobility: Needs Assistance Bed Mobility: Supine to Sit;Sit to Supine     Supine to sit: Min guard Sit to supine: Min guard   General bed mobility comments: min guard for safety dur to impulsivity  Transfers Overall transfer level: Needs assistance   Transfers: Sit to/from Stand Sit to Stand: Min assist         General transfer comment: min assist for stability, posterior LOB when coming to upright  Ambulation/Gait Ambulation/Gait assistance: Min assist Ambulation Distance (Feet): 180 Feet Assistive device: None Gait Pattern/deviations: Step-through pattern;Decreased stride length;Drifts right/left;Narrow base of support Gait velocity: decreased Gait velocity interpretation: Below normal speed for age/gender General Gait Details: patient with instability noted throughout gait, multiple balance  checks and LOB requiring min assist continuously throughout ambulation. Cued for increased cadence for safety with mobility  Stairs            Wheelchair Mobility    Modified Rankin (Stroke Patients Only) Modified Rankin (Stroke Patients Only) Pre-Morbid Rankin Score: No symptoms Modified Rankin: Moderately severe disability     Balance Overall balance assessment: History of Falls (had fall during hospitalization)                                           Pertinent Vitals/Pain Pain Assessment: No/denies pain    Home Living Family/patient expects to be discharged to:: Private residence Living Arrangements: Alone Available Help at Discharge: Family Type of Home: House Home Access: Stairs to enter Entrance Stairs-Rails: Can reach both Entrance Stairs-Number of Steps: 3 Home Layout: One level Home Equipment: None      Prior Function Level of Independence: Independent               Hand Dominance   Dominant Hand: Right    Extremity/Trunk Assessment   Upper Extremity Assessment: Defer to OT evaluation           Lower Extremity Assessment: Overall WFL for tasks assessed (decreased coordiantion LEs during functional tasks)         Communication   Communication: HOH  Cognition Arousal/Alertness: Awake/alert Behavior During Therapy: Impulsive Overall Cognitive Status: Impaired/Different from baseline Area of Impairment: Safety/judgement;Awareness;Problem solving         Safety/Judgement: Decreased awareness of safety;Decreased awareness of deficits Awareness: Emergent Problem Solving: Requires verbal cues;Requires tactile cues      General Comments General comments (skin integrity, edema, etc.):  abrasion to back following fall this am    Exercises        Assessment/Plan    PT Assessment Patient needs continued PT services  PT Diagnosis Difficulty walking;Abnormality of gait   PT Problem List Decreased  strength;Decreased activity tolerance;Decreased balance;Decreased mobility;Decreased coordination;Decreased safety awareness  PT Treatment Interventions DME instruction;Gait training;Stair training;Functional mobility training;Therapeutic activities;Therapeutic exercise;Balance training;Patient/family education   PT Goals (Current goals can be found in the Care Plan section) Acute Rehab PT Goals Patient Stated Goal: to go home PT Goal Formulation: With patient Time For Goal Achievement: 02/16/16 Potential to Achieve Goals: Good    Frequency Min 3X/week   Barriers to discharge Decreased caregiver support      Co-evaluation               End of Session Equipment Utilized During Treatment: Gait belt Activity Tolerance: Patient tolerated treatment well Patient left: in bed;with call bell/phone within reach;with bed alarm set;with family/visitor present Nurse Communication: Mobility status         Time: 1610-1636 PT Time Calculation (min) (ACUTE ONLY): 26 min   Charges:   PT Evaluation $PT Eval Moderate Complexity: 1 Procedure     PT G CodesDuncan Dull March 01, 2016, 5:34 PM Alben Deeds, [place] DPT  [phone]

## 2016-02-02 NOTE — Progress Notes (Signed)
OT Cancellation Note  Patient Details Name: Jason Hawkins MRN: [id number] DOB: 1928-05-11   Cancelled Treatment:    Reason Eval/Treat Not Completed: Patient not medically ready Pt with active bedrest orders. Please update activity orders to initiate therapy. Thanks [place], OTR/L  [phone] 02/02/2016 02/02/2016, 6:13 AM

## 2016-02-02 NOTE — Care Management Note (Signed)
Case Management Note  Patient Details  Name: Jaicion Cahn MRN: [id number] Date of Birth: 1928-03-18  Subjective/Objective:   Pt admitted on 01/31/16 with [facility] s/p coil embolization of ACOM aneurysm.  PTA, pt independent of ADLS; has supportive family.               Action/Plan: Will follow for discharge planning as pt progresses.  Pt needs PT/OT eval, but apparently has BR orders.  MD, please lift BR order so PT/OT can evaluate.    Expected Discharge Date:                  Expected Discharge Plan:  [place]  In-House Referral:     Discharge planning Services  CM Consult  Post Acute Care Choice:    Choice offered to:     DME Arranged:    DME Agency:     HH Arranged:    [place] Agency:     Status of Service:  In process, will continue to follow  If discussed at Long Length of Stay Meetings, dates discussed:    Additional Comments:  Reinaldo Raddle, RN, BSN  Trauma/Neuro ICU Case Manager [phone]

## 2016-02-02 NOTE — Progress Notes (Signed)
Patient demanding to use the restroom. Patient resisting help from RN. Insisting the RN leave patient in the restroom. RN steps just outside the door to give patient privacy that he demands. Patient falls in restroom. Patient did not hit head. RN assisted patient back to bed. Dr. Kathyrn Sheriff and Dr. Leonie Man notified. No new orders given at this time. Will continue to monitor closely.

## 2016-02-02 NOTE — Progress Notes (Signed)
Pt seen and examined. No issues overnight. Pt has no complaints this am.  EXAM: Temp:  [98 F (36.7 C)-99.8 F (37.7 C)] 99.6 F (37.6 C) (06/29 0400) Pulse Rate:  [58-89] 58 (06/29 0800) Resp:  [15-29] 20 (06/29 0800) BP: (105-150)/(45-95) 119/45 mmHg (06/29 0800) SpO2:  [90 %-98 %] 93 % (06/29 0800) Arterial Line BP: (140-157)/(49-54) 144/49 mmHg (06/28 1854) Intake/Output      06/28 0701 - 06/29 0700 06/29 0701 - 06/30 0700   I.V. (mL/kg) 2610.5 (30.9) 130 (1.5)   Total Intake(mL/kg) 2610.5 (30.9) 130 (1.5)   Urine (mL/kg/hr) 1360 (0.7)    Stool 0 (0)    Blood 25 (0)    Total Output 1385     Net +1225.5 +130        Urine Occurrence 2 x    Stool Occurrence 1 x     Awake, alert, oriented CN intact Good strength throughout Groin soft  LABS: Lab Results  Component Value Date   CREATININE 0.90 01/31/2016   BUN 15 01/31/2016   NA 139 01/31/2016   K 4.1 01/31/2016   CL 100* 01/31/2016   CO2 28 01/31/2016   Lab Results  Component Value Date   WBC 8.2 02/01/2016   HGB 12.1* 02/01/2016   HCT 35.5* 02/01/2016   MCV 92.7 02/01/2016   PLT 153 02/01/2016    IMPRESSION: - 82 y.o. male SAH d# 3 s/p coiling Acom aneurysm, doing well  PLAN: - d/c foley - OOB - I've already started Aspirin 325mg  for a small loop of coil which may be extending into the lumen of the Acom.

## 2016-02-02 NOTE — Op Note (Addendum)
DIAGNOSTIC CEREBRAL ANGIOGRAM COIL EMBOLIZATION OF ANTERIOR COMMUNICATING ARTERY ANEURYSM   OPERATOR:  Dr. Consuella Lose, MD  HISTORY:  The patient is a 83 y.o. 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 CT demonstrated diffuse SAH and CTA suggested small Acom aneurysm. He therefore presents for possible aneurysm coiling.  APPROACH:  The technical aspects of the procedure as well as its potential risks and benefits were reviewed with the patient. These risks included but were not limited bleeding, infection, allergic reaction, damage to organs/vital structures, stroke, non-diagnostic procedure, and the catastrophic outcomes of heart attack, coma, and death. With an understanding of these risks, informed consent was obtained and witnessed.   The patient was placed in the supine position on the angiography table and the skin of right groin prepped in the usual sterile fashion. The diagnostic portion of the procedure was done under local, while the interventional portion of the procedure was performed under general anesthesia.  A 5- French sheath was introduced in the right common femoral artery using Seldinger technique. A fluorophase sequence was used to document the sheath position.   HEPARIN: 0 Units total.  CONTRAST AGENT: 90cc, Omnipaque 300  FLUOROSCOPY TIME: 30.0 combined AP and lateral minutes   CATHETER(S) AND WIRE(S):  0.035" glidewire  100 cm JB 1 glide catheter 90 cm 6 [place] NeuronMax guide sheath 120 cm 6 [place] Berenstein select guide catheter 120cm 6 [place] Simmons-2 select catheter 115 cm 058 catalyst 5 guide catheter 150 cm 027 marksman microcatheter XT-17 microcatheter Synchro 2 standard microwire  VESSELS CATHETERIZED:  Right internal carotid artery Left internal carotid artery Right anterior cerebral artery Right common femoral  VESSELS STUDIED:  Right common carotid, neck Right  internal carotid, head Left internal carotid, head Right internal carotid, pre-embolization Right internal carotid, during embolization Right internal carotid, immediate post embolization Right internal carotid, final control  COILS DEPLOYED (MRI COMPATIBLE): 40mm x 4cm Target 360 Nano 37mm x 2cm Target 360 Nano  PROCEDURAL NARRATIVE: The JB 1 was introduced over the guidewire, and the right internal and left internal carotid arteries were selectively catheterized. Cervical and cerebral angiogram to taken. Catheter was then removed without incident. The patient then was placed under general anesthesia, and the 5 [place] sheath was upsized to 8 [place].   The guide sheath was then introduced over the select guide catheter and 035 wire. Attempts were made to catheterized the brachiocephalic artery without success. The Berenstein catheter was removed, and the [facility] to guide catheter was introduced over the wire. This was reformed in the left subclavian artery. The right common carotid artery was then catheterized, and the guide sheath was advanced into the distal right common carotid artery.  The select catheter was then removed without incident. The guide catheter was then introduced over the marksman microcatheter and Synchro standard microwire. The microcatheter was used to select the right internal carotid artery, and the catalyst guide catheter was then advanced to its final position in the distal cervical internal carotid artery. Attempts were made to advance the catheter into the intracranial carotid artery however due to patient anatomy, the guide catheter was hubbed in the guide sheath. The marksman microcatheter was then removed, and the XT 17 microcatheter was introduced. The aneurysm was then catheterized. The above coils were then sequentially deployed, with intermittent angiograms taken to confirm good position. Immediate postembolization angiogram was then taken. The microcatheter was then  removed without incident. The guide catheter and  sheath were then withdrawn into the distal cervical internal carotid artery, and final control angiogram was taken. The entire catheter construct was then removed synchronously without incident.  INTERPRETATION:  Right femoral:  Normal vessel. There is moderate atherosclerotic disease involving the external iliac as well as the superficial femoral and profunda femoris. Arterial sheath in adequate position.   Right common carotid artery, neck: There is mild stenosis less than 50% involving the origin of the right internal carotid artery, without flow limitation.  Right internal carotid artery, head: Injection demonstrates widely patent internal carotid artery, with normal bifurcation into anterior cerebral and middle cerebral arteries. The distal branches of the MCA and ACA are widely patent. Both anterior cerebral arteries fill from this right-sided injection. There is a small, superiorly and leftward projecting aneurysm in the region of the anterior commute dictating artery. This measures approximately 2.5 x 2.0 mm. Capillary phase is unremarkable, with filling of bilateral ACA territories. Venous phase is normal.  Left internal carotid artery, head: Injection reveals widely patent left internal carotid artery, which terminates primarily in a left middle cerebral artery. The left A1 is hypoplastic. Distal middle cerebral artery branches are widely patent. Capillary phase is unremarkable. Venous phase is normal.  Right internal carotid artery pre-embolization: Pre-embolization angiogram further delineates the above-described small anterior commute dictating artery aneurysm. The anterior commute dictating artery as well as bilateral A2 segments are widely patent.  Right internal carotid artery during embolization: Angiograms taken during coiling demonstrate progressive occlusion of the aneurysm. No filling defects are seen to suggest thrombus.  The right A1 and bilateral A2 segments remain widely patent. A small loop of coil is seen at the neck of the aneurysm. It is unclear whether this may project minimally into the lumen of the anterior comminicator.  Right internal carotid artery immediate postembolization: The aneurysm appears nearly completely occluded. There is miniscule aneurysm filling at the neck. The loop of coil at the aneurysm neck remained stable. The right A1 and bilateral A2 segments are widely patent.  Right internal carotid artery final control: Injection demonstrates widely patent internal carotid artery, with normal bifurcation into anterior cerebral and middle cerebral arteries. The distal branches of the MCA and ACA are widely patent. Both anterior cerebral arteries fill normally. Coil mass remains stable. Capillary phase demonstrates good perfusion of the ACA territory bilaterally. Venous phase is unremarkable.  DISPOSITION:  Upon completion of the study, the femoral sheath was removed and hemostasis obtained using a 7-Fr ExoSeal closure device. Good proximal and distal lower extremity pulses were documented upon achievement of hemostasis.   The procedure was well tolerated and no early complications were observed.    The patient was extubated and taken to the postanesthesia care unit in stable hemodynamic condition for further care.  IMPRESSION:  1. 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  The preliminary results of this procedure were shared with the patient and the patient's family.

## 2016-02-03 ENCOUNTER — Inpatient Hospital Stay (HOSPITAL_COMMUNITY): Payer: Medicare Other

## 2016-02-03 ENCOUNTER — Telehealth: Payer: Self-pay | Admitting: Cardiovascular Disease

## 2016-02-03 DIAGNOSIS — Z954 Presence of other heart-valve replacement: Secondary | ICD-10-CM

## 2016-02-03 DIAGNOSIS — I482 Chronic atrial fibrillation: Secondary | ICD-10-CM

## 2016-02-03 DIAGNOSIS — I609 Nontraumatic subarachnoid hemorrhage, unspecified: Secondary | ICD-10-CM

## 2016-02-03 LAB — PROTIME-INR
INR: 1.28 (ref 0.00–1.49)
Prothrombin Time: 16.1 seconds — ABNORMAL HIGH (ref 11.6–15.2)

## 2016-02-03 NOTE — Progress Notes (Addendum)
Transcranial Doppler  Date POD PCO2 HCT BP  MCA ACA PCA OPHT SIPH VERT Basilar  02/01/16 MS RS   35.5 138/57 Right  Left   53  55   -66  -41   39  40   20  18   -45  48   -45  -37   -24      02/03/16 JE    116/66 Right  Left   77  62   -70  -48   33  36   25  29   -50  -48   -39  -32   *           Right  Left                                             Right  Left                                             Right  Left                                            Right  Left                                            Right  Left                                        MCA = Middle Cerebral Artery      OPHT = Opthalmic Artery     BASILAR = Basilar Artery   ACA = Anterior Cerebral Artery     SIPH = Carotid Siphon PCA = Posterior Cerebral Artery   VERT = Verterbral Artery                   Normal MCA = 62+\-12 ACA = 50+\-12 PCA = 42+\-23   * Unable to insonate basilar 6/30 JE   02/03/2016 2:09 PM

## 2016-02-03 NOTE — Progress Notes (Signed)
Physical Therapy Treatment Patient Details Name: Jason Hawkins MRN: [id number] DOB: [birth year] Today's Date: 02/03/2016    History of Present Illness 82 y.o. male with history of atrial fibrillation on coumadin, aortic valve replacement, hypertension, hypercholesterolemia and lower extremity edema presenting with sudden onset severe HA. Blood pressure elevated. CT showed [facility]    PT Comments    Patient seen for mobility progression. Tolerated ambulation with some improvements in stability this session. Remains impulsive at times. Educated on use of IS during session. VSS throughout. Pt does report modest headache and dizziness this session. Nsg aware.   Follow Up Recommendations  Home health PT;Supervision/Assistance - 24 hour     Equipment Recommendations  None recommended by PT    Recommendations for Other Services       Precautions / Restrictions Precautions Precautions: Fall Restrictions Weight Bearing Restrictions: No    Mobility  Bed Mobility               General bed mobility comments: received in standing from OT  Transfers Overall transfer level: Needs assistance Equipment used: None Transfers: Sit to/from Stand Sit to Stand: Min guard;Min assist         General transfer comment: min assist from low toilet surface, min guard from chair  Ambulation/Gait Ambulation/Gait assistance: Min guard Ambulation Distance (Feet): 180 Feet Assistive device: None Gait Pattern/deviations: Step-through pattern;Decreased stride length;Drifts right/left;Narrow base of support Gait velocity: decreased Gait velocity interpretation: Below normal speed for age/gender General Gait Details: continues to demonstrate some modest instability with ambulation, one noted LOB with head turn   Stairs            Wheelchair Mobility    Modified Rankin (Stroke Patients Only) Modified Rankin (Stroke Patients Only) Pre-Morbid Rankin Score: No symptoms Modified Rankin:  Moderately severe disability     Balance Overall balance assessment: History of Falls;Needs assistance Sitting-balance support: Feet supported;No upper extremity supported Sitting balance-Leahy Scale: Fair Sitting balance - Comments: R lateral lean; pt able to correct   Standing balance support: No upper extremity supported;During functional activity Standing balance-Leahy Scale: Fair                      Cognition Arousal/Alertness: Awake/alert Behavior During Therapy: WFL for tasks assessed/performed;Impulsive (slightly impulsive) Overall Cognitive Status: Impaired/Different from baseline (daughter reports pt seems baseline cognitively) Area of Impairment: Safety/judgement         Safety/Judgement: Decreased awareness of safety;Decreased awareness of deficits          Exercises      General Comments General comments (skin integrity, edema, etc.): educated on use of IS, performed with teachback and cues x5      Pertinent Vitals/Pain Pain Assessment: Faces Faces Pain Scale: Hurts little more Pain Location: headache Pain Descriptors / Indicators: Headache Pain Intervention(s): Monitored during session    Home Living Family/patient expects to be discharged to:: Private residence Living Arrangements: Alone Available Help at Discharge: Family;Available 24 hours/day Type of Home: House Home Access: Stairs to enter Entrance Stairs-Rails: Can reach both Home Layout: One level Home Equipment: Grab bars - tub/shower;Shower seat - built in      Prior Function Level of Independence: Independent          PT Goals (current goals can now be found in the care plan section) Acute Rehab PT Goals Patient Stated Goal: to go home PT Goal Formulation: With patient Time For Goal Achievement: 02/16/16 Potential to Achieve Goals: Good Progress towards PT goals: Progressing  toward goals    Frequency  Min 3X/week    PT Plan Current plan remains appropriate     Co-evaluation             End of Session Equipment Utilized During Treatment: Gait belt Activity Tolerance: Patient tolerated treatment well Patient left: in chair;with call bell/phone within reach;with chair alarm set;with family/visitor present     Time: 1220-1239 PT Time Calculation (min) (ACUTE ONLY): 19 min  Charges:  $Gait Training: 8-22 mins                    G CodesDuncan Dull Feb 29, 2016, 2:30 PM Alben Deeds, [place] DPT  [phone]

## 2016-02-03 NOTE — Consult Note (Signed)
CARDIOLOGY CONSULT NOTE       Patient ID: Genard Rasmuson MRN: [id number] DOB/AGE: March 18, 1928 82 y.o.  Admit date: 01/31/2016 Referring Physician:  Vertell Limber Primary Physician: Walker Kehr, MD Primary Cardiologist:  Johnsie Cancel Reason for Consultation: Anticoagulation  Active Problems:   Subarachnoid bleed [facility])   Ruptured cerebral aneurysm [facility])   HPI:  82 y.o. asked to see by neuro and family. Long time patient of mine. History of chronic afib on coumadin Also tissue Edwards Magna AVR.  Not always compliant as he lives in [place] 6 months out of year and had been using neighbors home machine to monitor. Admitted with SAH and severe frontal headache. Found to have cerebral aneurysm that was coiled. INR on admission Was only 1.8 and reversed with Kcentra and Vit K.  Doing better Headache improved. No cardiac complaints. No focal neurologic deficits. Currently on Aspirin only.  BP improved off iv agensts at this time. Question of what anticoagulant to restart and when   ROS All other systems reviewed and negative except as noted above  Past Medical History  Diagnosis Date  . Chronic atrial fibrillation ([place])   . Severe aortic stenosis     s/p aortic valve replacement using a pericardial tissue valve  . HTN (hypertension)   . Lower extremity edema   . Hypercholesteremia     Family History  Problem Relation Age of Onset  . Heart attack    . Diabetes    . Diabetes Mother   . Heart disease Father   . Heart attack Father     Social History   Social History  . Marital Status: Widowed    Spouse Name: N/A  . Number of Children: N/A  . Years of Education: N/A   Occupational History  . Not on file.   Social History Main Topics  . Smoking status: Former Research scientist (life sciences)  . Smokeless tobacco: Not on file  . Alcohol Use: 8.4 oz/week    14 Cans of beer per week  . Drug Use: No  . Sexual Activity: No   Other Topics Concern  . Not on file   Social History Narrative    Past  Surgical History  Procedure Laterality Date  . Aortic valve replacement  08/30/2003    Dr. Cyndia Bent  . Radiology with anesthesia N/A 02/01/2016    Procedure: RADIOLOGY WITH ANESTHESIA;  Surgeon: Consuella Lose, MD;  Location: [facility];  Service: Radiology;  Laterality: N/A;     . amLODipine  2.5 mg Oral Daily  . aspirin  325 mg Oral Daily  . docusate sodium  100 mg Oral BID  . niMODipine  60 mg Oral Q4H   Or  . NiMODipine  60 mg Per Tube Q4H  . pantoprazole  40 mg Oral Daily   Or  . pantoprazole sodium  40 mg Per Tube Daily   . sodium chloride 100 mL/hr at 02/02/16 0700    Physical Exam: Blood pressure 134/58, pulse 83, temperature 98.9 F (37.2 C), temperature source Oral, resp. rate 21, height 5\' 9"  (1.753 m), weight 186 lb (84.369 kg), SpO2 90 %.    Affect appropriate Healthy:  appears stated age 36: normal Neck supple with no adenopathy JVP normal no bruits no thyromegaly Lungs clear with no wheezing and good diaphragmatic motion Heart:  S1/S2 SEM murmur, no rub, gallop or click PMI normal Abdomen: benighn, BS positve, no tenderness, no AAA no bruit.  No HSM or HJR Distal pulses intact with no bruits Plus one bilateral  edema Neuro non-focal Skin warm and dry No muscular weakness   Labs:   Lab Results  Component Value Date   WBC 8.2 02/01/2016   HGB 12.1* 02/01/2016   HCT 35.5* 02/01/2016   MCV 92.7 02/01/2016   PLT 153 02/01/2016    Recent Labs Lab 01/31/16 2106 01/31/16 2119  NA 137 139  K 3.8 4.1  CL 102 100*  CO2 28  --   BUN 12 15  CREATININE 0.91 0.90  CALCIUM 9.6  --   PROT 7.8  --   BILITOT 1.2  --   ALKPHOS 69  --   ALT 16*  --   AST 26  --   GLUCOSE 107* 107*   No results found for: CKTOTAL, CKMB, CKMBINDEX, TROPONINI Lab Results  Component Value Date   CHOL 214* 01/04/2016   CHOL 195 04/28/2014   Lab Results  Component Value Date   HDL 60.50 01/04/2016   HDL 62.00 04/28/2014   Lab Results  Component Value Date   LDLCALC  134* 01/04/2016   LDLCALC 120* 04/28/2014   Lab Results  Component Value Date   TRIG 93.0 01/04/2016   TRIG 67.0 04/28/2014   Lab Results  Component Value Date   CHOLHDL 4 01/04/2016   CHOLHDL 3 04/28/2014   No results found for: LDLDIRECT    Radiology: Ct Angio Head W Or Wo Contrast  01/31/2016  CLINICAL DATA:  Subarachnoid hemorrhage. No known trauma. On Coumadin for aortic valve replacement. EXAM: CT ANGIOGRAPHY HEAD AND NECK TECHNIQUE: Multidetector CT imaging of the head and neck was performed using the standard protocol during bolus administration of intravenous contrast. Multiplanar CT image reconstructions and MIPs were obtained to evaluate the vascular anatomy. Carotid stenosis measurements (when applicable) are obtained utilizing NASCET criteria, using the distal internal carotid diameter as the denominator. CONTRAST:  50 mL Isovue 370 IV COMPARISON:  CT head 01/31/2016 FINDINGS: CTA NECK Aortic arch: Atherosclerotic calcification aortic arch without aneurysm or dissection. Atherosclerotic calcification proximal great vessels which are widely patent. Lung apices clear. Right carotid system: Right common carotid artery widely patent. Mild atherosclerotic calcification in the right carotid bifurcation without significant stenosis. Left carotid system: Left common carotid artery widely patent. Atherosclerotic calcification of the carotid bifurcation without significant stenosis. Vertebral arteries:Both vertebral arteries are patent to the basilar without significant vertebral stenosis. Skeleton: Moderately severe cervical spondylosis. No fracture or mass lesion. Other neck: Negative for mass or adenopathy in the neck. CTA HEAD Anterior circulation: Mild atherosclerotic calcification in the cavernous carotid artery bilaterally. No aneurysm in the cavernous carotid. Anterior and middle cerebral arteries patent bilaterally. 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  This is presumably the cause of the symmetric subarachnoid hemorrhage. Posterior circulation: Both vertebral arteries patent to the basilar. PICA patent. Basilar widely patent. Superior cerebellar and posterior cerebral arteries patent bilaterally. 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 This is presumably the cause of acute subarachnoid hemorrhage. No other aneurysm. Mild atherosclerotic disease the carotid bifurcation bilaterally without significant carotid stenosis. No significant vertebral stenosis. These results were called by telephone at the time of interpretation on 01/31/2016 at 10:12 pm to Dr. Wallie Char , who verbally acknowledged these results. Electronically Signed   By: Franchot Gallo M.D.   On: 01/31/2016 22:14   Ct Head Wo Contrast  02/03/2016  CLINICAL DATA:  Continued surveillance  subarachnoid hemorrhage. EXAM: CT HEAD WITHOUT CONTRAST TECHNIQUE: Contiguous axial images were obtained from the base of the skull through the vertex without intravenous contrast. COMPARISON:  Multiple priors. FINDINGS: The patient has undergone endovascular treatment with coil embolization of an anterior communicating artery aneurysm. Coil mass appears compact without migration. Resolution of previously noted blood in the basilar and interhemispheric cisterns. Slight layering hemorrhage in the occipital horns of the lateral ventricles but no intervening hydrocephalus, and no new subarachnoid blood. No visible cerebral infarction due to vasospasm. Mild sinus fluid predominantly sphenoid, likely due to recumbency. No mastoid fluid. Calvarium intact. Negative orbits. IMPRESSION: Satisfactory appearance status post coiling of an ACom aneurysm. No postprocedural complications.  No intervening hydrocephalus. Electronically Signed   By: Staci Righter M.D.   On: 02/03/2016 11:16   Ct Head Wo  Contrast  01/31/2016  CLINICAL DATA:  Sudden onset of headaches EXAM: CT HEAD WITHOUT CONTRAST TECHNIQUE: Contiguous axial images were obtained from the base of the skull through the vertex without intravenous contrast. COMPARISON:  None. FINDINGS: Bony calvarium is intact. There are changes consistent with subarachnoid hemorrhage along the distribution of the middle cerebral arteries and anterior cerebral arteries bilaterally. Given the patient's clinical history these changes are consistent with ruptured aneurysm. No intraventricular hemorrhage is noted. The basilar cisterns are not filled with blood. No mass lesion is noted. No acute infarct is seen. Mild atrophic changes are noted. IMPRESSION: Subarachnoid hemorrhage as described above. A definitive aneurysm is not noted on this exam. Critical Value/emergent results were called by telephone at the time of interpretation on 01/31/2016 at 9:28 pm to Dr. Nicole Kindred, who verbally acknowledged these results. Electronically Signed   By: Inez Catalina M.D.   On: 01/31/2016 21:30   Ct Angio Neck W Or Wo Contrast  01/31/2016  CLINICAL DATA:  Subarachnoid hemorrhage. No known trauma. On Coumadin for aortic valve replacement. EXAM: CT ANGIOGRAPHY HEAD AND NECK TECHNIQUE: Multidetector CT imaging of the head and neck was performed using the standard protocol during bolus administration of intravenous contrast. Multiplanar CT image reconstructions and MIPs were obtained to evaluate the vascular anatomy. Carotid stenosis measurements (when applicable) are obtained utilizing NASCET criteria, using the distal internal carotid diameter as the denominator. CONTRAST:  50 mL Isovue 370 IV COMPARISON:  CT head 01/31/2016 FINDINGS: CTA NECK Aortic arch: Atherosclerotic calcification aortic arch without aneurysm or dissection. Atherosclerotic calcification proximal great vessels which are widely patent. Lung apices clear. Right carotid system: Right common carotid artery widely patent.  Mild atherosclerotic calcification in the right carotid bifurcation without significant stenosis. Left carotid system: Left common carotid artery widely patent. Atherosclerotic calcification of the carotid bifurcation without significant stenosis. Vertebral arteries:Both vertebral arteries are patent to the basilar without significant vertebral stenosis. Skeleton: Moderately severe cervical spondylosis. No fracture or mass lesion. Other neck: Negative for mass or adenopathy in the neck. CTA HEAD Anterior circulation: Mild atherosclerotic calcification in the cavernous carotid artery bilaterally. No aneurysm in the cavernous carotid. Anterior and middle cerebral arteries patent bilaterally. 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 This is presumably the cause of the symmetric subarachnoid hemorrhage. Posterior circulation: Both vertebral arteries patent to the basilar. PICA patent. Basilar widely patent. Superior cerebellar and posterior cerebral arteries patent bilaterally. 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 This is presumably the cause of acute subarachnoid hemorrhage. No other aneurysm. Mild  atherosclerotic disease the carotid bifurcation bilaterally without significant carotid stenosis. No significant vertebral stenosis. These results were called by telephone at the time of interpretation on 01/31/2016 at 10:12 pm to Dr. Wallie Char , who verbally acknowledged these results. Electronically Signed   By: Franchot Gallo M.D.   On: 01/31/2016 22:14    EKG:  afib rate 92 no acute changes    ASSESSMENT AND PLAN:  Afib:  Rate control ok. Per neurology start NOAC in a week.  ? eliquis 2.5 bid  AVR:  Edwards Magna tissue valve does not need anticoagulation for this. SEM on exam no need for echo at this time HTN:  Improved continue current meds.  SAH:  Post  coiling ACA with good result INR was subRx at time on ASA   Appreciate neurology/ IR/ and neurosurgical care of [place]   Signed: Jenkins Rouge 02/03/2016, 1:19 PM

## 2016-02-03 NOTE — Evaluation (Addendum)
Occupational Therapy Evaluation Patient Details Name: Jason Hawkins MRN: [id number] DOB: 1927/12/07 Today's Date: 02/03/2016    History of Present Illness 82 y.o. male with history of atrial fibrillation on coumadin, aortic valve replacement, hypertension, hypercholesterolemia and lower extremity edema presenting with sudden onset severe HA. Blood pressure elevated. CT showed SAH   Clinical Impression   Pt reports he was independent with ADLs PTA. Currently pt overall min guard for safety with ADLs and functional mobility. Pt slightly impulsive with transfers and noted to have decreased awareness of safety and deficits. Discussed need for 24/7 supervision upon return home with daughter since pt currently lives alone; she reports they can make arrangements for 24/7 supervision if needed upon return home. SpO2 down to 85% on RA during functional activity; returned to low 90s following activity. Recommending HHOT for follow up to maximize pts independence and safety with ADLs and functional mobility upon return home. Pt would benefit from continued skilled OT to address established goals.    Follow Up Recommendations  Home health OT;Supervision/Assistance - 24 hour    Equipment Recommendations  None recommended by OT    Recommendations for Other Services       Precautions / Restrictions Precautions Precautions: Fall Restrictions Weight Bearing Restrictions: No      Mobility Bed Mobility Overal bed mobility: Needs Assistance Bed Mobility: Supine to Sit     Supine to sit: Min guard     General bed mobility comments: Min guard for safety; no physical assist required but pt using bed rails to help boost self up.   Transfers Overall transfer level: Needs assistance Equipment used: None Transfers: Sit to/from Stand Sit to Stand: Min guard         General transfer comment: Min guard for safety; no physical assist required. No unsteadiness or LOB noted    Balance Overall  balance assessment: History of Falls;Needs assistance Sitting-balance support: Feet supported;No upper extremity supported Sitting balance-Leahy Scale: Fair Sitting balance - Comments: R lateral lean; pt able to correct   Standing balance support: No upper extremity supported;During functional activity Standing balance-Leahy Scale: Fair Standing balance comment: Pt able to stand at sink and complete grooming activities without UE support                            ADL Overall ADL's : Needs assistance/impaired Eating/Feeding: Set up;Sitting   Grooming: Min guard;Standing;Oral care Grooming Details (indicate cue type and reason): Assist for opening toothbrush Upper Body Bathing: Set up;Supervision/ safety;Sitting   Lower Body Bathing: Min guard;Sit to/from stand   Upper Body Dressing : Set up;Supervision/safety;Sitting Upper Body Dressing Details (indicate cue type and reason): to don hospital gown Lower Body Dressing: Min guard;Sit to/from stand   Toilet Transfer: Min guard;Ambulation;Regular Museum/gallery exhibitions officer and Hygiene: Min guard;Sit to/from stand       Functional mobility during ADLs: Min guard General ADL Comments: Pt slighlty impulsive and has decreased awareness of deficits and safety. SpO2 down to 85 during oral care task, returned to low 90s following task. HR up to 107 (highest) and low 100s at times but returned quickly to normal range.     Vision Vision Assessment?: No apparent visual deficits   Perception     Praxis      Pertinent Vitals/Pain Pain Assessment: No/denies pain     Hand Dominance Right   Extremity/Trunk Assessment Upper Extremity Assessment Upper Extremity Assessment: Overall WFL for tasks assessed (decreased  fine motor coordination with functional tasks)   Lower Extremity Assessment Lower Extremity Assessment: Defer to PT evaluation   Cervical / Trunk Assessment Cervical / Trunk Assessment: Normal    Communication Communication Communication: HOH   Cognition Arousal/Alertness: Awake/alert Behavior During Therapy: WFL for tasks assessed/performed;Impulsive (slightly impulsive) Overall Cognitive Status: Impaired/Different from baseline (daughter reports pt seems baseline cognitively) Area of Impairment: Safety/judgement         Safety/Judgement: Decreased awareness of safety;Decreased awareness of deficits         General Comments       Exercises       Shoulder Instructions      Home Living Family/patient expects to be discharged to:: Private residence Living Arrangements: Alone Available Help at Discharge: Family;Available 24 hours/day Type of Home: House Home Access: Stairs to enter [employer] of Steps: 3 Entrance Stairs-Rails: Can reach both Home Layout: One level     Bathroom Shower/Tub: Occupational psychologist: Handicapped height     Home Equipment: Grab bars - tub/shower;Shower seat - built in          Prior Functioning/Environment Level of Independence: Independent             OT Diagnosis: Generalized weakness;Altered mental status   OT Problem List: Decreased strength;Impaired balance (sitting and/or standing);Decreased coordination;Decreased safety awareness;Decreased knowledge of use of DME or AE   OT Treatment/Interventions: Self-care/ADL training;Energy conservation;DME and/or AE instruction;Therapeutic activities;Patient/family education;Balance training    OT Goals(Current goals can be found in the care plan section) Acute Rehab OT Goals Patient Stated Goal: to go home OT Goal Formulation: With patient/family Time For Goal Achievement: 02/17/16 Potential to Achieve Goals: Good ADL Goals Pt Will Perform Grooming: with modified independence;standing Pt Will Perform Upper Body Bathing: with modified independence;sitting;standing Pt Will Perform Lower Body Bathing: with modified independence;sit to/from stand Pt  Will Transfer to Toilet: with modified independence;ambulating;regular height toilet Pt Will Perform Toileting - Clothing Manipulation and hygiene: with modified independence;sit to/from stand  OT Frequency: Min 2X/week   Barriers to D/C: Decreased caregiver support  Lives alone; daughter reports they can work out 24/7 supervision if needed       Co-evaluation              End of Session Equipment Utilized During Treatment: Gait belt  Activity Tolerance: Patient tolerated treatment well Patient left: Other (comment) (with PT)   Time: [id number] OT Time Calculation (min): 23 min Charges:  OT General Charges $OT Visit: 1 Procedure OT Evaluation $OT Eval Moderate Complexity: 1 Procedure OT Treatments $Self Care/Home Management : 8-22 mins G-Codes:     Binnie Kand M.S., OTR/L Pager: [phone]  02/03/2016, 12:58 PM

## 2016-02-03 NOTE — Care Management Important Message (Signed)
Important Message  Patient Details  Name: Jason Hawkins MRN: [id number] Date of Birth: 1927-10-03   Medicare Important Message Given:  Yes    [name] 02/03/2016, 11:28 AM

## 2016-02-03 NOTE — Telephone Encounter (Signed)
Will forward to Dr.Nishan. 

## 2016-02-03 NOTE — Progress Notes (Signed)
Pt seen and examined. No issues overnight. Has mild HA, some nausea yesterday. No other complaints.  EXAM: Temp:  [97.7 F (36.5 C)-100 F (37.8 C)] 98.7 F (37.1 C) (06/30 0803) Pulse Rate:  [41-100] 66 (06/30 0800) Resp:  [15-27] 24 (06/30 0800) BP: (105-154)/(54-66) 133/63 mmHg (06/30 0800) SpO2:  [89 %-96 %] 91 % (06/30 0800) Intake/Output      06/29 0701 - 06/30 0700 06/30 0701 - 07/01 0700   P.O. 240    I.V. (mL/kg) 2430 (28.8) 100 (1.2)   Total Intake(mL/kg) 2670 (31.6) 100 (1.2)   Urine (mL/kg/hr) 650 (0.3)    Stool     Blood     Total Output 650     Net +2020 +100        Urine Occurrence 4 x     Awake, alert, oriented Speech fluent, appropriate CN grossly intact 5/5 BUE/BLE Wound c/d/i  LABS: Lab Results  Component Value Date   CREATININE 0.90 01/31/2016   BUN 15 01/31/2016   NA 139 01/31/2016   K 4.1 01/31/2016   CL 100* 01/31/2016   CO2 28 01/31/2016   Lab Results  Component Value Date   WBC 8.2 02/01/2016   HGB 12.1* 02/01/2016   HCT 35.5* 02/01/2016   MCV 92.7 02/01/2016   PLT 153 02/01/2016    IMPRESSION: - 82 y.o. male [place] d# 4 s/p coiling Acom, doing well  PLAN: - Cont observation, [place] - Mobilize as tolerated with assistance

## 2016-02-03 NOTE — Progress Notes (Signed)
STROKE TEAM PROGRESS NOTE   SUBJECTIVE (INTERVAL HISTORY) His son, daughter-in-law and RN are at the bedside. He is lying in the bed and c/o nausea and not feeling well today. senies headache. He is A&Ox3.  . Transcranial Doppler studies today showed no vasospasm.   OBJECTIVE Temp:  [98.3 F (36.8 C)-100 F (37.8 C)] 98.9 F (37.2 C) (06/30 1131) Pulse Rate:  [63-100] 72 (06/30 1400) Cardiac Rhythm:  [-] Atrial fibrillation (06/30 1124) Resp:  [15-27] 19 (06/30 1400) BP: (114-154)/(54-66) 136/57 mmHg (06/30 1400) SpO2:  [87 %-96 %] 92 % (06/30 1400)  CBC:   Recent Labs Lab 01/31/16 2106 01/31/16 2119 02/01/16 0715  WBC 9.0  --  8.2  NEUTROABS 6.5  --   --   HGB 14.4 15.6 12.1*  HCT 43.4 46.0 35.5*  MCV 93.5  --  92.7  PLT 187  --  0000000    Basic Metabolic Panel:   Recent Labs Lab 01/31/16 2106 01/31/16 2119  NA 137 139  K 3.8 4.1  CL 102 100*  CO2 28  --   GLUCOSE 107* 107*  BUN 12 15  CREATININE 0.91 0.90  CALCIUM 9.6  --     Lipid Panel:     Component Value Date/Time   CHOL 214* 01/04/2016 1106   TRIG 93.0 01/04/2016 1106   HDL 60.50 01/04/2016 1106   CHOLHDL 4 01/04/2016 1106   VLDL 18.6 01/04/2016 1106   LDLCALC 134* 01/04/2016 1106   HgbA1c: No results found for: HGBA1C Urine Drug Screen:     Component Value Date/Time   LABOPIA NONE DETECTED 01/31/2016 2117   COCAINSCRNUR NONE DETECTED 01/31/2016 2117   LABBENZ NONE DETECTED 01/31/2016 2117   AMPHETMU NONE DETECTED 01/31/2016 2117   THCU NONE DETECTED 01/31/2016 2117   LABBARB NONE DETECTED 01/31/2016 2117      IMAGING  Ct Head Wo Contrast  02/03/2016  CLINICAL DATA:  Continued surveillance subarachnoid hemorrhage. EXAM: CT HEAD WITHOUT CONTRAST TECHNIQUE: Contiguous axial images were obtained from the base of the skull through the vertex without intravenous contrast. COMPARISON:  Multiple priors. 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 Coil mass appears compact without migration. Resolution of previously noted blood in the basilar and interhemispheric cisterns. Slight layering hemorrhage in the occipital horns of the lateral ventricles but no intervening hydrocephalus, and no new subarachnoid blood. No visible cerebral infarction due to vasospasm. Mild sinus fluid predominantly sphenoid, likely due to recumbency. No mastoid fluid. Calvarium intact. Negative orbits. IMPRESSION: Satisfactory appearance status post coiling of an ACom aneurysm. No postprocedural complications.  No intervening hydrocephalus. Electronically Signed   By: Staci Righter M.D.   On: 02/03/2016 11:16    PHYSICAL EXAM Pleasant elderly Caucasian male currently not in distress. . Afebrile. Head is nontraumatic. Neck is supple without bruit.    Cardiac exam no murmur or gallop. Lungs are clear to auscultation. Distal pulses are well felt. Neurological Exam ;  Awake  Alert oriented x 3. Normal speech and language.eye movements full without nystagmus.fundi were not visualized. Vision acuity and fields appear normal. Hearing is normal. Palatal movements are normal. Face symmetric. Tongue midline. Normal strength, tone, reflexes and coordination. Normal sensation. Gait deferred.   ASSESSMENT/PLAN [name] is a 82 y.o. male with history of atrial fibrillation on coumadin, aortic valve replacement, hypertension, hypercholesterolemia and lower extremity edema presenting with sudden onset severe HA. Blood pressure elevated. CT showed SAH.   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 Mild atherosclerotic disease.  Angio confirmed ACOM aneurysm which was subsequently coiled without stent placement. Add aspirin in a few days and NOAC in 1 week.  TCDs MWF  On nimodipine  SCDs for VTE prophylaxis Diet Heart Room service appropriate?: Yes; Fluid consistency:: Thin  warfarin daily prior to  admission. INR 1.8. Reversal received  Therapy recommendations:  pending . Activity per NS/attending. Ok to be OOB from stroke standpoint  Disposition:  pending   Discontinue foley  Disontinue aline  Atrial Fibrillation  Home anticoagulation:  warfarin daily   INR 1.8 on admission  Received reversal agent  Plan to add aspirin in a few days and transition to NOA in 1 week    Hypertensive Emergency  BP 220/109 on admission in setting of neurologic symptoms  Started on cardene drip, now off  BP Stable  SBP goal <  160  Start on norvasc 2.5 mg daily.  On nimondipine for [facility] which will also lower BP  monitor  Other Stroke Risk Factors  Advanced age  ETOH use, advised to drink no more than 2 drink(s) a day  Severe aortic stenosis s/p AVR [facility] day # 3     I have personally examined this patient, reviewed notes, independently viewed imaging studies, participated in medical decision making and plan of care. I have made any additions or clarifications directly to the above note. Agree with note above. The patient has had successful coiling of his aneurysm. Continue aspirin Repeat CT scan of the head this morning shows slight blood layeing in ventricles, no hydrocephalus and resolution of most subarachnoid blood.. Continue on amlodipine hypertension This patient is critically ill and at significant risk of neurological worsening, death and care requires constant monitoring of vital signs, hemodynamics,respiratory and cardiac monitoring, extensive review of multiple databases, frequent neurological assessment, discussion with family, other specialists and medical decision making of high complexity.I have made any additions or clarifications directly to the above note.This critical care time does not reflect procedure time, or teaching time or supervisory time of PA/NP/Med Resident etc but could involve care discussion time.  I spent 30 minutes of neurocritical care  time  in the care of  this patient.      Jason Contras, MD Medical Director [facility] Pager: [phone] 02/03/2016 2:48 PM   To contact Stroke Continuity provider, please refer to [email or web address] After hours, contact General Neurology

## 2016-02-03 NOTE — Telephone Encounter (Signed)
New Message  Pt dtr in law requested that Dr Johnsie Cancel be informed that pt is @ [facility] for an aneurysm.

## 2016-02-04 ENCOUNTER — Inpatient Hospital Stay (HOSPITAL_COMMUNITY): Payer: Medicare Other

## 2016-02-04 DIAGNOSIS — J9600 Acute respiratory failure, unspecified whether with hypoxia or hypercapnia: Secondary | ICD-10-CM

## 2016-02-04 DIAGNOSIS — J9601 Acute respiratory failure with hypoxia: Secondary | ICD-10-CM

## 2016-02-04 LAB — ECHOCARDIOGRAM COMPLETE
AO mean calculated velocity dopler: 188 cm/s
AOPV: 0.46 m/s
AOVTI: 49.9 cm
AV Area mean vel: 1.66 cm2
AV Mean grad: 16 mmHg
AV VEL mean LVOT/AV: 0.44
AV peak Index: 0.85
AV vel: 1.77
AVAREAMEANVIN: 0.81 cm2/m2
AVAREAVTI: 1.73 cm2
AVAREAVTIIND: 0.87 cm2/m2
AVPG: 29 mmHg
AVPKVEL: 270 cm/s
CHL CUP AV VALUE AREA INDEX: 0.87
FS: 30 % (ref 28–44)
HEIGHTINCHES: 69 in
IV/PV OW: 0.94
LA ID, A-P, ES: 51 mm
LA diam index: 2.5 cm/m2
LA vol A4C: 93.7 ml
LA vol index: 47.7 mL/m2
LA vol: 97.4 mL
LEFT ATRIUM END SYS DIAM: 51 mm
LV PW d: 11.4 mm — AB (ref 0.6–1.1)
LVOT VTI: 23.2 cm
LVOT area: 3.8 cm2
LVOT diameter: 22 mm
LVOT peak grad rest: 6 mmHg
LVOT peak vel: 123 cm/s
LVOTSV: 88 mL
LVOTVTI: 0.46 cm
Valve area: 1.77 cm2
WEIGHTICAEL: 2976 [oz_av]

## 2016-02-04 LAB — BASIC METABOLIC PANEL
ANION GAP: 9 (ref 5–15)
BUN: 19 mg/dL (ref 6–20)
CO2: 19 mmol/L — AB (ref 22–32)
Calcium: 8.5 mg/dL — ABNORMAL LOW (ref 8.9–10.3)
Chloride: 109 mmol/L (ref 101–111)
Creatinine, Ser: 0.94 mg/dL (ref 0.61–1.24)
GFR calc Af Amer: >60 mL/min (ref >60)
GFR calc non Af Amer: >60 mL/min (ref >60)
GLUCOSE: 118 mg/dL — AB (ref 65–99)
POTASSIUM: 4 mmol/L (ref 3.5–5.1)
Sodium: 137 mmol/L (ref 135–145)

## 2016-02-04 LAB — CBC WITH DIFFERENTIAL/PLATELET
BASOS ABS: 0 10*3/uL (ref 0.0–0.1)
Basophils Relative: 0 %
Eosinophils Absolute: 0.1 10*3/uL (ref 0.0–0.7)
Eosinophils Relative: 0 %
HEMATOCRIT: 37.1 % — AB (ref 39.0–52.0)
HEMOGLOBIN: 12.2 g/dL — AB (ref 13.0–17.0)
LYMPHS PCT: 8 %
Lymphs Abs: 1.2 10*3/uL (ref 0.7–4.0)
MCH: 32.1 pg (ref 26.0–34.0)
MCHC: 32.9 g/dL (ref 30.0–36.0)
MCV: 97.6 fL (ref 78.0–100.0)
MONO ABS: 2.2 10*3/uL — AB (ref 0.1–1.0)
MONOS PCT: 16 %
NEUTROS ABS: 10.3 10*3/uL — AB (ref 1.7–7.7)
NEUTROS PCT: 75 %
Platelets: 92 10*3/uL — ABNORMAL LOW (ref 150–400)
RBC: 3.8 MIL/uL — ABNORMAL LOW (ref 4.22–5.81)
RDW: 14 % (ref 11.5–15.5)
WBC: 13.7 10*3/uL — ABNORMAL HIGH (ref 4.0–10.5)

## 2016-02-04 LAB — LACTIC ACID, PLASMA: LACTIC ACID, VENOUS: 1.8 mmol/L (ref 0.5–1.9)

## 2016-02-04 LAB — TROPONIN I
Troponin I: 0.05 ng/mL (ref <0.03)
Troponin I: 0.05 ng/mL (ref <0.03)

## 2016-02-04 LAB — PROCALCITONIN

## 2016-02-04 LAB — BRAIN NATRIURETIC PEPTIDE: B Natriuretic Peptide: 93.7 pg/mL (ref 0.0–100.0)

## 2016-02-04 MED ORDER — FUROSEMIDE 10 MG/ML IJ SOLN
40.0000 mg | Freq: Once | INTRAMUSCULAR | Status: AC
  Administered 2016-02-04: 40 mg via INTRAVENOUS
  Filled 2016-02-04: qty 4

## 2016-02-04 MED ORDER — FUROSEMIDE 10 MG/ML IJ SOLN
20.0000 mg | Freq: Three times a day (TID) | INTRAMUSCULAR | Status: DC
  Administered 2016-02-04 – 2016-02-05 (×4): 20 mg via INTRAVENOUS
  Filled 2016-02-04 (×3): qty 2

## 2016-02-04 MED ORDER — DM-GUAIFENESIN ER 30-600 MG PO TB12
1.0000 | ORAL_TABLET | Freq: Two times a day (BID) | ORAL | Status: DC
  Administered 2016-02-04 – 2016-02-13 (×18): 1 via ORAL
  Filled 2016-02-04 (×19): qty 1

## 2016-02-04 MED ORDER — SODIUM CHLORIDE 0.9% FLUSH
3.0000 mL | Freq: Two times a day (BID) | INTRAVENOUS | Status: DC
  Administered 2016-02-04 – 2016-02-06 (×5): 3 mL via INTRAVENOUS
  Administered 2016-02-07: 10 mL via INTRAVENOUS
  Administered 2016-02-08 – 2016-02-13 (×10): 3 mL via INTRAVENOUS

## 2016-02-04 MED ORDER — POTASSIUM CHLORIDE CRYS ER 20 MEQ PO TBCR
40.0000 meq | EXTENDED_RELEASE_TABLET | Freq: Every day | ORAL | Status: DC
Start: 2016-02-04 — End: 2016-02-06
  Administered 2016-02-04 – 2016-02-05 (×2): 40 meq via ORAL
  Administered 2016-02-06: 20 meq via ORAL
  Filled 2016-02-04 (×3): qty 2

## 2016-02-04 MED ORDER — SODIUM CHLORIDE 0.9% FLUSH: 3.0000 mL | INTRAVENOUS | Status: DC | PRN

## 2016-02-04 NOTE — Progress Notes (Signed)
[facility] Progress Note Patient Name: Kawon Culverhouse DOB: 02/07/1928 MRN: [id number]   Date of Service  02/04/2016  HPI/Events of Note  Cam check on pt > very comfortable. Reading newspaper. Not on O2.  130/80, 90, 20  94% Labs were also reasurring (pct,lactate,ekg,dopplers)  eICU Interventions  Cont to observe.         [place] 02/04/2016, 9:17 PM

## 2016-02-04 NOTE — Progress Notes (Signed)
Patient ID: Jason Hawkins, male   DOB: 1928-02-08, 82 y.o.   MRN: [id number] 82 year old who is now post coil day 3 from a, aneurysm doing well with minimal headache but did experience some heaviness to his chest and shortness of breath yesterday for treatment initiated anxiety better today.  Awake alert oriented moves all externally as well oxygen saturation is her little low on room air.  Check a BMP check CBC check a chest x-ray will obtain critical care consult.

## 2016-02-04 NOTE — Progress Notes (Signed)
Dr. Saintclair Halsted in to see patient, who was complaining of chest pressure and has non-productive cough.  Pt. Is afebrile.  Lungs CTA with diminished breath sounds in bases.  Legs shiny and taught in appearance.  MD to consult with to pulmonary.

## 2016-02-04 NOTE — Consult Note (Signed)
PULMONARY / CRITICAL CARE MEDICINE   Name: Jason Hawkins MRN: [id number] DOB: 03/14/1928    ADMISSION DATE:  01/31/2016 CONSULTATION DATE:  02/04/2016 LOS 4 days   REFERRING MD:  Dr Saintclair Halsted  CHIEF COMPLAINT:  Acute resp failure  HISTORY OF PRESENT ILLNESS:    82 year old maleith history of atrial fibrillation on chronic Coumadin with a history of therapeutic INR for the last 12 years. He also has a history of aortic valve replacement. He lives in [place], [place]. History is given by Dr. Leonie Man  the neurologist, review of the chart, the patient, his wife and bedside nurse admitted 01/31/2016. 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 His CPK center and vitamin K. Subsequently status post coiling without stent placement. His Coumadin was stopped. He was placed on aspirin, and nimodipine. According to him and his wife overall he has been feeling well but has been requiring some oxygen.. Wilburn Mylar 02/03/2016 feeling nauseated but CT head showed improvement in his intracranial bleed. Overnight developed chest pressure and a feeling of something in his chest particularly mucus that he is not able to bring out. He is asking for Mucinex. This was associated with worsening hypoxemia from 2 L to 4 L facemask. He attributes all this to being in bed for the last several days. Currently denies any chest pressure or wheezing or pedal edema. Review of the chart shows that he is on normal saline 100 mL per hour and is 4 L positive according to the nurse since admission. This no hemoptysis or sputum or fever. Pulmonary critical care consulted 02/04/2016 of note he has been off Coumadin since admission.  Last echocardiogram in our system was in 2009 and last chest x-ray was in 2005. Chest x-ray just done in my personal visualization shows pulmonary edema pattern and right sided however  PAST MEDICAL HISTORY :  He  has a past medical history of Chronic atrial fibrillation  ([place]); Severe aortic stenosis; HTN (hypertension); Lower extremity edema; and Hypercholesteremia.  PAST SURGICAL HISTORY: He  has past surgical history that includes Aortic valve replacement (08/30/2003) and Radiology with anesthesia (N/A, 02/01/2016).  Allergies  Allergen Reactions  . Bee Venom Anaphylaxis  . Codeine     nausea    No current facility-administered medications on file prior to encounter.   Current Outpatient Prescriptions on File Prior to Encounter  Medication Sig  . co-enzyme Q-10 50 MG capsule Take 50 mg by mouth daily.  . diphenhydrAMINE (BENADRYL) 25 MG tablet Take 2 tablets (50 mg total) by mouth every 6 (six) hours as needed for itching or allergies (bee sting).  . EPINEPHrine 0.3 mg/0.3 mL IJ SOAJ injection Inject 0.3 mg into the muscle once.  [name] GLUCOSAMINE-CHONDROITIN PO Take 1 tablet by mouth daily.    [name] Hyaluronic Acid 20-60 MG CAPS Take 1 tablet by mouth daily.    . L-ARGININE PO Take 1 capsule by mouth 4 (four) times a week.  . L-CITRULLINE PO Take 1 capsule by mouth 4 (four) times a week.  . Multiple Vitamin (MULTIVITAMIN) capsule Take 1 capsule by mouth daily.    [name] OVER THE COUNTER MEDICATION Take 1 Dose by mouth 4 (four) times a week. Helps with Testerone.  . Red Yeast Rice 600 MG CAPS Take 1 capsule by mouth daily. Reported on 12/28/2015  . TURMERIC PO Take 1 tablet by mouth daily.    [name] warfarin (COUMADIN) 5 MG tablet Take 1 tablet (5 mg total) by mouth as directed.  . [  DISCONTINUED] testosterone (ANDRODERM) 2.5 MG/24HR Place 1 patch onto the skin daily.    FAMILY HISTORY:  His indicated that his mother is deceased. He indicated that his father is deceased. He indicated that his maternal grandmother is deceased. He indicated that his maternal grandfather is deceased. He indicated that his paternal grandmother is deceased. He indicated that his paternal grandfather is deceased.   SOCIAL HISTORY: He  reports that he has quit smoking. He does not have any  smokeless tobacco history on file. He reports that he drinks about 8.4 oz of alcohol per week. He reports that he does not use illicit drugs.  REVIEW OF SYSTEMS:   11 point ros per hpi. otherise negative    VITAL SIGNS: BP 148/60 mmHg  Pulse 75  Temp(Src) 98.5 F (36.9 C) (Axillary)  Resp 18  Ht 5\' 9"  (1.753 m)  Wt 84.369 kg (186 lb)  BMI 27.45 kg/m2  SpO2 94%  HEMODYNAMICS:    VENTILATOR SETTINGS:    INTAKE / OUTPUT: I/O last 3 completed shifts: In: [id number] [P.O.:320; I.V.:3600] Out: 905 [Urine:905]  PHYSICAL EXAMINATION: General:  Elderly male looks younger than stated age. Sitting in the chair facemask oxygen on Neuro:  Denies any headache. He was equal and reactive to light. Moves all 4 extremities normally. No focal deficits. Alert and oriented 3 HEENT:  Facemask oxygen on. No elevated JVP no neck nodes Cardiovascular:  Irregularly irregular no murmurs Lungs:  Overall air entry is diminished some scattered bibasal crackles Abdomen:  Soft nontender no organomegaly Musculoskeletal:  Chronic venous stasis edema present. No warmth no unilateral swelling Skin:  Intact in the exposed areas.  LABS: PULMONARY  Recent Labs Lab 01/31/16 2119  TCO2 29    CBC  Recent Labs Lab 01/31/16 2106 01/31/16 2119 02/01/16 0715  HGB 14.4 15.6 12.1*  HCT 43.4 46.0 35.5*  WBC 9.0  --  8.2  PLT 187  --  153    COAGULATION  Recent Labs Lab 02/01/16 0715 02/01/16 1236 02/01/16 2000 02/02/16 0613 02/03/16 0940  INR 1.24 1.17 1.18 1.25 1.28    CARDIAC  No results for input(s): TROPONINI in the last 168 hours. No results for input(s): PROBNP in the last 168 hours.   CHEMISTRY  Recent Labs Lab 01/31/16 2106 01/31/16 2119  NA 137 139  K 3.8 4.1  CL 102 100*  CO2 28  --   GLUCOSE 107* 107*  BUN 12 15  CREATININE 0.91 0.90  CALCIUM 9.6  --    Estimated Creatinine Clearance: 58.9 mL/min (by C-G formula based on Cr of 0.9).   LIVER  Recent Labs Lab  01/31/16 2106  02/01/16 0715 02/01/16 1236 02/01/16 2000 02/02/16 0613 02/03/16 0940  AST 26  --   --   --   --   --   --   ALT 16*  --   --   --   --   --   --   ALKPHOS 69  --   --   --   --   --   --   BILITOT 1.2  --   --   --   --   --   --   PROT 7.8  --   --   --   --   --   --   ALBUMIN 4.3  --   --   --   --   --   --   INR 1.86*  < > 1.24 1.17 1.18  1.25 1.28  < > = values in this interval not displayed.   INFECTIOUS No results for input(s): LATICACIDVEN, PROCALCITON in the last 168 hours.   ENDOCRINE CBG (last 3)  No results for input(s): GLUCAP in the last 72 hours.       IMAGING x48h  - image(s) personally visualized  -   highlighted in bold Ct Head Wo Contrast  02/03/2016  CLINICAL DATA:  Continued surveillance subarachnoid hemorrhage. EXAM: CT HEAD WITHOUT CONTRAST TECHNIQUE: Contiguous axial images were obtained from the base of the skull through the vertex without intravenous contrast. COMPARISON:  Multiple priors. FINDINGS: The patient has undergone endovascular treatment with coil embolization of an anterior communicating artery aneurysm. Coil mass appears compact without migration. Resolution of previously noted blood in the basilar and interhemispheric cisterns. Slight layering hemorrhage in the occipital horns of the lateral ventricles but no intervening hydrocephalus, and no new subarachnoid blood. No visible cerebral infarction due to vasospasm. Mild sinus fluid predominantly sphenoid, likely due to recumbency. No mastoid fluid. Calvarium intact. Negative orbits. IMPRESSION: Satisfactory appearance status post coiling of an ACom aneurysm. No postprocedural complications.  No intervening hydrocephalus. Electronically Signed   By: Staci Righter M.D.   On: 02/03/2016 11:16      ASSESSMENT / PLAN:  PULMONARY A: Acute postoperative hypoxemic respiratory failure - clinical differential diagnoses includes atelectasis, pulmonary embolism, acute diastolic  dysfunction especially in the setting of 4 L positivity and baseline atrial fibrillation and hospital-acquired pneumonia. Off these clinically based on chest x-ray it appears most likely has acute diastolic dysfunction   P:   - - Aggressive pulmonary toilet with Mucinex and incentive spirometry;; help with possible atelectasis component - Lasix 40 mg IV 1 associated with saline lock and stopping saline infusion - acute diastolic dysfunction - Stat EKG, echocardiogram and cycle troponin - Check pro calcitonin and lactic acid and if he starts differentiating to his infection start antibiotics  - Do duplex lower extremity and is positive Place IVC filter - Hold off on CT angiogram chest given the fact that he cannot receive anticoagulation for another week + leading diagnoses acute diastolic dysfunction + depending on the outcome of the above. - SCDs for DVT prophylaxis - Intubate if he gets worse   Patient and his wife updated in detail at the bedside     Dr. Brand Males, M.D., [facility].C.P Pulmonary and Critical Care Medicine Staff Physician [place] Pager: [phone], If no answer or between  15:00h - 7:00h: call [phone]  02/04/2016 9:35 AM

## 2016-02-04 NOTE — Progress Notes (Signed)
Cardiology to see as needed over the weekend Please call with questions  Thompson Grayer MD, [facility] 02/04/2016 12:40 PM

## 2016-02-04 NOTE — Progress Notes (Signed)
  Echocardiogram 2D Echocardiogram has been performed.  Jason Hawkins 02/04/2016, 11:29 AM

## 2016-02-04 NOTE — Progress Notes (Signed)
VASCULAR LAB PRELIMINARY  PRELIMINARY  PRELIMINARY  PRELIMINARY  Bilateral lower extremity venous duplex completed.    Preliminary report:  There is no obvious evidence of DVT or SVT noted in the bilateral lower extremities.   Sonakshi Rolland, RVT 02/04/2016, 11:49 AM

## 2016-02-04 NOTE — Progress Notes (Signed)
STROKE TEAM PROGRESS NOTE   SUBJECTIVE (INTERVAL HISTORY) His wife, Dr Chase Caller and RN are at the bedside. He Has been short of breath since yesterday. He was anxious. Is currently on face mask oxygen. He denies any headache or neurological changes   OBJECTIVE Temp:  [98.2 F (36.8 C)-99.9 F (37.7 C)] 99.9 F (37.7 C) (07/01 0900) Pulse Rate:  [65-98] 91 (07/01 0900) Cardiac Rhythm:  [-] Atrial fibrillation (07/01 0800) Resp:  [18-32] 29 (07/01 0900) BP: (124-162)/(51-73) 153/73 mmHg (07/01 0900) SpO2:  [90 %-97 %] 93 % (07/01 0900)  CBC:   Recent Labs Lab 01/31/16 2106 01/31/16 2119 02/01/16 0715  WBC 9.0  --  8.2  NEUTROABS 6.5  --   --   HGB 14.4 15.6 12.1*  HCT 43.4 46.0 35.5*  MCV 93.5  --  92.7  PLT 187  --  0000000    Basic Metabolic Panel:   Recent Labs Lab 01/31/16 2106 01/31/16 2119  NA 137 139  K 3.8 4.1  CL 102 100*  CO2 28  --   GLUCOSE 107* 107*  BUN 12 15  CREATININE 0.91 0.90  CALCIUM 9.6  --     Lipid Panel:     Component Value Date/Time   CHOL 214* 01/04/2016 1106   TRIG 93.0 01/04/2016 1106   HDL 60.50 01/04/2016 1106   CHOLHDL 4 01/04/2016 1106   VLDL 18.6 01/04/2016 1106   LDLCALC 134* 01/04/2016 1106   HgbA1c: No results found for: HGBA1C Urine Drug Screen:     Component Value Date/Time   LABOPIA NONE DETECTED 01/31/2016 2117   COCAINSCRNUR NONE DETECTED 01/31/2016 2117   LABBENZ NONE DETECTED 01/31/2016 2117   AMPHETMU NONE DETECTED 01/31/2016 2117   THCU NONE DETECTED 01/31/2016 2117   LABBARB NONE DETECTED 01/31/2016 2117      IMAGING  Ct Head Wo Contrast  02/03/2016  CLINICAL DATA:  Continued surveillance subarachnoid hemorrhage. EXAM: CT HEAD WITHOUT CONTRAST TECHNIQUE: Contiguous axial images were obtained from the base of the skull through the vertex without intravenous contrast. COMPARISON:  Multiple priors. FINDINGS: The patient has undergone endovascular treatment with coil embolization of an anterior  communicating artery aneurysm. Coil mass appears compact without migration. Resolution of previously noted blood in the basilar and interhemispheric cisterns. Slight layering hemorrhage in the occipital horns of the lateral ventricles but no intervening hydrocephalus, and no new subarachnoid blood. No visible cerebral infarction due to vasospasm. Mild sinus fluid predominantly sphenoid, likely due to recumbency. No mastoid fluid. Calvarium intact. Negative orbits. IMPRESSION: Satisfactory appearance status post coiling of an ACom aneurysm. No postprocedural complications.  No intervening hydrocephalus. Electronically Signed   By: Staci Righter M.D.   On: 02/03/2016 11:16   Dg Chest Port 1 View  02/04/2016  CLINICAL DATA:  Shortness of Breath EXAM: PORTABLE CHEST 1 VIEW COMPARISON:  10/19/2003 FINDINGS: Cardiac shadow is mildly enlarged. Postsurgical changes are again seen. Diffuse patchy infiltrates are noted throughout the right lung as well as to a lesser degree in the left lung base. No bony abnormality is noted. IMPRESSION: Bilateral airspace opacities right greater than left. Followup examination is recommended. Electronically Signed   By: Inez Catalina M.D.   On: 02/04/2016 10:11    PHYSICAL EXAM Pleasant elderly Caucasian male currently not in distress. . Afebrile. Head is nontraumatic. Neck is supple without bruit.    Cardiac exam no murmur or gallop. Lungs are clear to auscultation. Distal pulses are well felt. Neurological Exam ;  Awake  Alert oriented x 3. Normal speech and language.eye movements full without nystagmus.fundi were not visualized. Vision acuity and fields appear normal. Hearing is normal. Palatal movements are normal. Face symmetric. Tongue midline. Normal strength, tone, reflexes and coordination. Normal sensation. Gait deferred.   ASSESSMENT/PLAN [name] is a 82 y.o. male with history of atrial fibrillation on coumadin, aortic valve replacement, hypertension,  hypercholesterolemia and lower extremity edema presenting with sudden onset severe HA. Blood pressure elevated. CT showed SAH.   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 Mild atherosclerotic disease.  Angio confirmed ACOM aneurysm which was subsequently coiled without stent placement. Add aspirin in a few days and NOAC in 1 week.  TCDs MWF  On nimodipine  SCDs for VTE prophylaxis Diet Heart Room service appropriate?: Yes; Fluid consistency:: Thin  warfarin daily prior to admission. INR 1.8. Reversal received  Therapy recommendations:  pending . Activity per NS/attending. Ok to be OOB from stroke standpoint  Disposition:  pending   Discontinue foley  Disontinue aline  Atrial Fibrillation  Home anticoagulation:  warfarin daily   INR 1.8 on admission  Received reversal agent  Plan to add aspirin in a few days and transition to NOA in 1 week    Hypertensive Emergency  BP 220/109 on admission in setting of neurologic symptoms  Started on cardene drip, now off  BP Stable  SBP goal <  160  Start on norvasc 2.5 mg daily.  On nimondipine for [facility] which will also lower BP  monitor  Other Stroke Risk Factors  Advanced age  ETOH use, advised to drink no more than 2 drink(s) a day  Severe aortic stenosis s/p AVR [facility] day # 4     I have personally examined this patient, reviewed notes, independently viewed imaging studies, participated in medical decision making and plan of care. I have made any additions or clarifications directly to the above note. Agree with note above. The patient has had successful coiling of his aneurysm. Continue aspirin.critical care evaluating for shortness of breath. Discussed with Dr. Chase Caller and patient's family at the bedside.  This patient is critically ill and at significant risk of neurological worsening, death and care requires constant monitoring of vital signs, hemodynamics,respiratory and  cardiac monitoring, extensive review of multiple databases, frequent neurological assessment, discussion with family, other specialists and medical decision making of high complexity.I have made any additions or clarifications directly to the above note.This critical care time does not reflect procedure time, or teaching time or supervisory time of PA/NP/Med Resident etc but could involve care discussion time.  I spent 35 minutes of neurocritical care time  in the care of  this patient.      Antony Contras, MD Medical Director [facility] Pager: [phone] 02/04/2016 1:04 PM   To contact Stroke Continuity provider, please refer to [email or web address] After hours, contact General Neurology

## 2016-02-05 ENCOUNTER — Inpatient Hospital Stay (HOSPITAL_COMMUNITY): Payer: Medicare Other

## 2016-02-05 LAB — URINALYSIS W MICROSCOPIC (NOT AT ARMC)
BILIRUBIN URINE: NEGATIVE
Bacteria, UA: NONE SEEN
GLUCOSE, UA: NEGATIVE mg/dL
Ketones, ur: NEGATIVE mg/dL
Leukocytes, UA: NEGATIVE
NITRITE: NEGATIVE
PH: 5 (ref 5.0–8.0)
Protein, ur: NEGATIVE mg/dL
SPECIFIC GRAVITY, URINE: 1.019 (ref 1.005–1.030)

## 2016-02-05 LAB — CBC
HEMATOCRIT: 35 % — AB (ref 39.0–52.0)
Hemoglobin: 11.4 g/dL — ABNORMAL LOW (ref 13.0–17.0)
MCH: 31.8 pg (ref 26.0–34.0)
MCHC: 32.6 g/dL (ref 30.0–36.0)
MCV: 97.5 fL (ref 78.0–100.0)
Platelets: 161 10*3/uL (ref 150–400)
RBC: 3.59 MIL/uL — ABNORMAL LOW (ref 4.22–5.81)
RDW: 13.7 % (ref 11.5–15.5)
WBC: 12.4 10*3/uL — AB (ref 4.0–10.5)

## 2016-02-05 LAB — BASIC METABOLIC PANEL
ANION GAP: 8 (ref 5–15)
BUN: 20 mg/dL (ref 6–20)
CALCIUM: 8.5 mg/dL — AB (ref 8.9–10.3)
CO2: 23 mmol/L (ref 22–32)
Chloride: 106 mmol/L (ref 101–111)
Creatinine, Ser: 0.98 mg/dL (ref 0.61–1.24)
GFR calc Af Amer: >60 mL/min (ref >60)
GFR calc non Af Amer: >60 mL/min (ref >60)
GLUCOSE: 116 mg/dL — AB (ref 65–99)
Potassium: 3.7 mmol/L (ref 3.5–5.1)
Sodium: 137 mmol/L (ref 135–145)

## 2016-02-05 LAB — TROPONIN I: TROPONIN I: 0.05 ng/mL — AB (ref <0.03)

## 2016-02-05 LAB — PROCALCITONIN: Procalcitonin: <0.1 ng/mL

## 2016-02-05 LAB — LACTIC ACID, PLASMA: Lactic Acid, Venous: 0.8 mmol/L (ref 0.5–1.9)

## 2016-02-05 LAB — MAGNESIUM: Magnesium: 1.7 mg/dL (ref 1.7–2.4)

## 2016-02-05 MED ORDER — FUROSEMIDE 20 MG PO TABS
20.0000 mg | ORAL_TABLET | Freq: Two times a day (BID) | ORAL | Status: DC
  Administered 2016-02-05 – 2016-02-06 (×2): 20 mg via ORAL
  Filled 2016-02-05 (×2): qty 1

## 2016-02-05 MED ORDER — MAGNESIUM SULFATE 2 GM/50ML IV SOLN
2.0000 g | Freq: Once | INTRAVENOUS | Status: AC
  Administered 2016-02-05: 2 g via INTRAVENOUS
  Filled 2016-02-05: qty 50

## 2016-02-05 NOTE — Progress Notes (Signed)
Patient ID: Jason Hawkins, male   DOB: 27-Jun-1928, 82 y.o.   MRN: [id number] Post-coil day for for an aneurysmal subarachnoid hemorrhage with coiling of an Acomm aneurysm patient doing much better this morning breathing much better much more comfortable  Awake alert oriented strength 5 out of 5  Continue to observe in the ICU fluid status will be somewhat tenuous need to keep him as hypervolemic as we can tolerate but certainly he is much more comfortable today than yesterday where he was overloaded.

## 2016-02-05 NOTE — Progress Notes (Signed)
STROKE TEAM PROGRESS NOTE   SUBJECTIVE (INTERVAL HISTORY)  He was short of breath since yesterday With swollen legs but has done much better after diuresis. He seems more comfortable now with his breathing. He  is currently on face mask oxygen. He denies any headache or neurological changes   OBJECTIVE Temp:  [98 F (36.7 C)-100.6 F (38.1 C)] 98.5 F (36.9 C) (07/02 1200) Pulse Rate:  [63-107] 78 (07/02 1000) Cardiac Rhythm:  [-] Atrial fibrillation (07/02 0800) Resp:  [17-33] 23 (07/02 1000) BP: (103-165)/(45-153) 124/55 mmHg (07/02 1000) SpO2:  [89 %-98 %] 98 % (07/02 1000)  CBC:   Recent Labs Lab 01/31/16 2106  02/04/16 1647 02/05/16 0655  WBC 9.0  < > 13.7* 12.4*  NEUTROABS 6.5  --  10.3*  --   HGB 14.4  < > 12.2* 11.4*  HCT 43.4  < > 37.1* 35.0*  MCV 93.5  < > 97.6 97.5  PLT 187  < > 92* 161  < > = values in this interval not displayed.  Basic Metabolic Panel:   Recent Labs Lab 02/04/16 1647 02/05/16 0655  NA 137 137  K 4.0 3.7  CL 109 106  CO2 19* 23  GLUCOSE 118* 116*  BUN 19 20  CREATININE 0.94 0.98  CALCIUM 8.5* 8.5*  MG  --  1.7    Lipid Panel:     Component Value Date/Time   CHOL 214* 01/04/2016 1106   TRIG 93.0 01/04/2016 1106   HDL 60.50 01/04/2016 1106   CHOLHDL 4 01/04/2016 1106   VLDL 18.6 01/04/2016 1106   LDLCALC 134* 01/04/2016 1106   HgbA1c: No results found for: HGBA1C Urine Drug Screen:     Component Value Date/Time   LABOPIA NONE DETECTED 01/31/2016 2117   COCAINSCRNUR NONE DETECTED 01/31/2016 2117   LABBENZ NONE DETECTED 01/31/2016 2117   AMPHETMU NONE DETECTED 01/31/2016 2117   THCU NONE DETECTED 01/31/2016 2117   LABBARB NONE DETECTED 01/31/2016 2117      IMAGING  Dg Chest Port 1 View  02/05/2016  CLINICAL DATA:  Acute respiratory failure Fever of 100 this morning O2 at 96% EXAM: PORTABLE CHEST - 1 VIEW COMPARISON:  the previous day's study FINDINGS: Slight improvement in the focal right upper and lower lobe airspace  disease. Left retrocardiac consolidation/atelectasis persists. Heart size upper limits normal.  Previous CABG and AVR. Small pleural effusions persist. Degenerative changes in the right shoulder. IMPRESSION: 1. Slight improvement in right lung airspace disease. 2. Small effusions with persistent left retrocardiac consolidation/atelectasis. Electronically Signed   By: Lucrezia Europe M.D.   On: 02/05/2016 08:40   Dg Chest Port 1 View  02/04/2016  CLINICAL DATA:  Shortness of Breath EXAM: PORTABLE CHEST 1 VIEW COMPARISON:  10/19/2003 FINDINGS: Cardiac shadow is mildly enlarged. Postsurgical changes are again seen. Diffuse patchy infiltrates are noted throughout the right lung as well as to a lesser degree in the left lung base. No bony abnormality is noted. IMPRESSION: Bilateral airspace opacities right greater than left. Followup examination is recommended. Electronically Signed   By: Inez Catalina M.D.   On: 02/04/2016 10:11    PHYSICAL EXAM Pleasant elderly Caucasian male currently not in distress. . Afebrile. Head is nontraumatic. Neck is supple without bruit.    Cardiac exam no murmur or gallop. Lungs are clear to auscultation. Distal pulses are well felt. Neurological Exam ;  Awake  Alert oriented x 3. Normal speech and language.eye movements full without nystagmus.fundi were not visualized. Vision acuity and  fields appear normal. Hearing is normal. Palatal movements are normal. Face symmetric. Tongue midline. Normal strength, tone, reflexes and coordination. Normal sensation. Gait deferred.   ASSESSMENT/PLAN Jason Hawkins is a 82 y.o. male with history of atrial fibrillation on coumadin, aortic valve replacement, hypertension, hypercholesterolemia and lower extremity edema presenting with sudden onset severe HA. Blood pressure elevated. CT showed SAH.   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 Mild atherosclerotic disease.  Angio confirmed ACOM aneurysm which was  subsequently coiled without stent placement. Add aspirin in a few days and NOAC in 1 week.  TCDs MWF  On nimodipine  SCDs for VTE prophylaxis Diet Heart Room service appropriate?: Yes; Fluid consistency:: Thin  warfarin daily prior to admission. INR 1.8. Reversal received  Therapy recommendations:  pending . Activity per NS/attending. Ok to be OOB from stroke standpoint  Disposition:  pending     Atrial Fibrillation  Home anticoagulation:  warfarin daily   INR 1.8 on admission  Received reversal agent  Plan to add aspirin in a few days and transition to NOA in 1 week    Hypertensive Emergency  BP 220/109 on admission in setting of neurologic symptoms  Started on cardene drip, now off  BP Stable  SBP goal <  160  Start on norvasc 2.5 mg daily.  On nimondipine for [facility] which will also lower BP  monitor  Other Stroke Risk Factors  Advanced age  ETOH use, advised to drink no more than 2 drink(s) a day  Severe aortic stenosis s/p AVR [facility] day # 5     I have personally examined this patient, reviewed notes, independently viewed imaging studies, participated in medical decision making and plan of care. I have made any additions or clarifications directly to the above note. Agree with note above. The patient has had successful coiling of his aneurysm. Continue aspirin.critical care evaluating for shortness of breath. Discussed with Dr. Chase Caller and patient   This patient is critically ill and at significant risk of neurological worsening, death and care requires constant monitoring of vital signs, hemodynamics,respiratory and cardiac monitoring, extensive review of multiple databases, frequent neurological assessment, discussion with family, other specialists and medical decision making of high complexity.I have made any additions or clarifications directly to the above note.This critical care time does not reflect procedure time, or teaching time or  supervisory time of PA/NP/Med Resident etc but could involve care discussion time.  I spent 30 minutes of neurocritical care time  in the care of  this patient.      Antony Contras, MD Medical Director [facility] Pager: [phone] 02/05/2016 12:52 PM   To contact Stroke Continuity provider, please refer to [email or web address] After hours, contact General Neurology

## 2016-02-05 NOTE — Consult Note (Addendum)
PULMONARY / CRITICAL CARE MEDICINE   Name: Jason Hawkins MRN: [id number] DOB: 1928/05/06    ADMISSION DATE:  01/31/2016 CONSULTATION DATE:  02/04/2016 LOS 5 days   REFERRING MD:  Dr Saintclair Halsted  CHIEF COMPLAINT:  Acute resp failure brief  82 year old maleith history of atrial fibrillation on chronic Coumadin with a history of therapeutic INR for the last 12 years. He also has a history of aortic valve replacement. He lives in [place], [place]. History is given by Dr. Leonie Man  the neurologist, review of the chart, the patient, his wife and bedside nurse admitted 01/31/2016. 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 His CPK center and vitamin K. Subsequently status post coiling without stent placement. His Coumadin was stopped. He was placed on aspirin, and nimodipine. According to him and his wife overall he has been feeling well but has been requiring some oxygen.. Wilburn Mylar 02/03/2016 feeling nauseated but CT head showed improvement in his intracranial bleed. Overnight developed chest pressure and a feeling of something in his chest particularly mucus that he is not able to bring out. He is asking for Mucinex. This was associated with worsening hypoxemia from 2 L to 4 L facemask. He attributes all this to being in bed for the last several days. Currently denies any chest pressure or wheezing or pedal edema. Review of the chart shows that he is on normal saline 100 mL per hour and is 4 L positive according to the nurse since admission. This no hemoptysis or sputum or fever. Pulmonary critical care consulted 02/04/2016 of note he has been off Coumadin since admission.  Last echocardiogram in our system was in 2009 and last chest x-ray was in 2005. Chest x-ray just done in my personal visualization shows pulmonary edema pattern and right sided however   SUBJECTIVE/OVERNIGHT/INTERVAL HX 02/05/16 - off o2 and now on RA with lasix 20mg  tid. CXr better. He also feels  better. NSGY prefers hypervolemia but patient resistant due to his hypoxemia.  Duplex LE neg dvt. ECHO with LVEF 55% and LVH with severe atrial dilatation and moderate TR and PASP 51. Had low grade fever with WBC 13K-12K  this AM but seems to be settling (s/p tylenol) ; PCT and lactate normal  VITAL SIGNS: BP 130/55 mmHg  Pulse 69  Temp(Src) 98.5 F (36.9 C) (Oral)  Resp 16  Ht 5\' 9"  (1.753 m)  Wt 84.369 kg (186 lb)  BMI 27.45 kg/m2  SpO2 93%  HEMODYNAMICS:    VENTILATOR SETTINGS:    INTAKE / OUTPUT: I/O last 3 completed shifts: In: 2420 [P.O.:1120; I.V.:1300] Out: 2755 [Urine:2755]  PHYSICAL EXAMINATION: General:  Elderly male looks younger than stated age. Sitting in the chair facemask oxygen on Neuro:  Denies any headache. He was equal and reactive to light. Moves all 4 extremities normally. No focal deficits. Alert and oriented 3 HEENT:  Facemask oxygen on. No elevated JVP no neck nodes Cardiovascular:  Irregularly irregular no murmurs Lungs:  Overall air entry is diminished some scattered bibasal crackles Abdomen:  Soft nontender no organomegaly Musculoskeletal:  Chronic venous stasis edema present. No warmth no unilateral swelling Skin:  Intact in the exposed areas.  LABS: PULMONARY  Recent Labs Lab 01/31/16 2119  TCO2 29    CBC  Recent Labs Lab 02/01/16 0715 02/04/16 1647 02/05/16 0655  HGB 12.1* 12.2* 11.4*  HCT 35.5* 37.1* 35.0*  WBC 8.2 13.7* 12.4*  PLT 153 92* 161    COAGULATION  Recent Labs Lab 02/01/16 0715 02/01/16  1236 02/01/16 2000 02/02/16 0613 02/03/16 0940  INR 1.24 1.17 1.18 1.25 1.28    CARDIAC    Recent Labs Lab 02/04/16 1647 02/04/16 1832 02/05/16 0216  TROPONINI 0.05* 0.05* 0.05*   No results for input(s): PROBNP in the last 168 hours.   CHEMISTRY  Recent Labs Lab 01/31/16 2106 01/31/16 2119 02/04/16 1647 02/05/16 0655  NA 137 139 137 137  K 3.8 4.1 4.0 3.7  CL 102 100* 109 106  CO2 28  --  19* 23   GLUCOSE 107* 107* 118* 116*  BUN 12 15 19 20   CREATININE 0.91 0.90 0.94 0.98  CALCIUM 9.6  --  8.5* 8.5*  MG  --   --   --  1.7   Estimated Creatinine Clearance: 54.1 mL/min (by C-G formula based on Cr of 0.98).   LIVER  Recent Labs Lab 01/31/16 2106  02/01/16 0715 02/01/16 1236 02/01/16 2000 02/02/16 0613 02/03/16 0940  AST 26  --   --   --   --   --   --   ALT 16*  --   --   --   --   --   --   ALKPHOS 69  --   --   --   --   --   --   BILITOT 1.2  --   --   --   --   --   --   PROT 7.8  --   --   --   --   --   --   ALBUMIN 4.3  --   --   --   --   --   --   INR 1.86*  < > 1.24 1.17 1.18 1.25 1.28  < > = values in this interval not displayed.   INFECTIOUS  Recent Labs Lab 02/04/16 1647 02/05/16 0216 02/05/16 0655  LATICACIDVEN 1.8  --  0.8  PROCALCITON <0.10 <0.10  --      ENDOCRINE CBG (last 3)  No results for input(s): GLUCAP in the last 72 hours.       IMAGING x48h  - image(s) personally visualized  -   highlighted in bold Dg Chest Port 1 View  02/05/2016  CLINICAL DATA:  Acute respiratory failure Fever of 100 this morning O2 at 96% EXAM: PORTABLE CHEST - 1 VIEW COMPARISON:  the previous day's study FINDINGS: Slight improvement in the focal right upper and lower lobe airspace disease. Left retrocardiac consolidation/atelectasis persists. Heart size upper limits normal.  Previous CABG and AVR. Small pleural effusions persist. Degenerative changes in the right shoulder. IMPRESSION: 1. Slight improvement in right lung airspace disease. 2. Small effusions with persistent left retrocardiac consolidation/atelectasis. Electronically Signed   By: Lucrezia Europe M.D.   On: 02/05/2016 08:40   Dg Chest Port 1 View  02/04/2016  CLINICAL DATA:  Shortness of Breath EXAM: PORTABLE CHEST 1 VIEW COMPARISON:  10/19/2003 FINDINGS: Cardiac shadow is mildly enlarged. Postsurgical changes are again seen. Diffuse patchy infiltrates are noted throughout the right lung as well as to a  lesser degree in the left lung base. No bony abnormality is noted. IMPRESSION: Bilateral airspace opacities right greater than left. Followup examination is recommended. Electronically Signed   By: Inez Catalina M.D.   On: 02/04/2016 10:11      ASSESSMENT / PLAN:  PULMONARY A: Acute postoperative hypoxemic respiratory failure   - most likely acute diast chf ; possible HCAP but doubt PE  - improved 02/05/16 with  lasix and on room air but has new fever with rise WBC though biomarkers normal  P:   - - Aggressive pulmonary toilet with Mucinex and incentive spirometry;; help with possible atelectasis component - Change lasix to po bid - IF fever respikes, start antibiotics (already cultured) - SCDs for DVT prophylaxis    Patient  updated in detail at the bedside     Dr. Brand Males, M.D., [facility].C.P Pulmonary and Critical Care Medicine Staff Physician [place] [facility] Pager: [phone], If no answer or between  15:00h - 7:00h: call [phone]  02/05/2016 2:06 PM

## 2016-02-06 ENCOUNTER — Inpatient Hospital Stay (HOSPITAL_COMMUNITY): Payer: Medicare Other

## 2016-02-06 DIAGNOSIS — I609 Nontraumatic subarachnoid hemorrhage, unspecified: Secondary | ICD-10-CM

## 2016-02-06 DIAGNOSIS — I10 Essential (primary) hypertension: Secondary | ICD-10-CM

## 2016-02-06 DIAGNOSIS — R0602 Shortness of breath: Secondary | ICD-10-CM

## 2016-02-06 LAB — CBC WITH DIFFERENTIAL/PLATELET
Basophils Absolute: 0 10*3/uL (ref 0.0–0.1)
Basophils Relative: 0 %
EOS ABS: 0.2 10*3/uL (ref 0.0–0.7)
EOS PCT: 2 %
HCT: 34 % — ABNORMAL LOW (ref 39.0–52.0)
HEMOGLOBIN: 11 g/dL — AB (ref 13.0–17.0)
LYMPHS ABS: 1.4 10*3/uL (ref 0.7–4.0)
LYMPHS PCT: 14 %
MCH: 31.1 pg (ref 26.0–34.0)
MCHC: 32.4 g/dL (ref 30.0–36.0)
MCV: 96 fL (ref 78.0–100.0)
MONOS PCT: 16 %
Monocytes Absolute: 1.6 10*3/uL — ABNORMAL HIGH (ref 0.1–1.0)
NEUTROS PCT: 68 %
Neutro Abs: 6.7 10*3/uL (ref 1.7–7.7)
Platelets: 181 10*3/uL (ref 150–400)
RBC: 3.54 MIL/uL — ABNORMAL LOW (ref 4.22–5.81)
RDW: 13.7 % (ref 11.5–15.5)
WBC: 10 10*3/uL (ref 4.0–10.5)

## 2016-02-06 LAB — PHOSPHORUS: PHOSPHORUS: 2.6 mg/dL (ref 2.5–4.6)

## 2016-02-06 LAB — PROCALCITONIN: Procalcitonin: <0.1 ng/mL

## 2016-02-06 NOTE — Care Management Important Message (Signed)
Important Message  Patient Details  Name: Jason Hawkins MRN: [id number] Date of Birth: 1928-05-31   Medicare Important Message Given:  Yes    Kylin Dubs Abena 02/06/2016, 10:54 AM

## 2016-02-06 NOTE — Progress Notes (Signed)
Pt seen and examined. No issues overnight. Pt denies SOB this am. Does have HA.  EXAM: Temp:  [98.9 F (37.2 C)-100.9 F (38.3 C)] 100.9 F (38.3 C) (07/03 1527) Pulse Rate:  [73-104] 81 (07/03 1700) Resp:  [16-34] 16 (07/03 1700) BP: (117-181)/(54-86) 140/54 mmHg (07/03 1700) SpO2:  [89 %-98 %] 92 % (07/03 1700) FiO2 (%):  [30 %] 30 % (07/03 1400) Intake/Output      07/02 0701 - 07/03 0700 07/03 0701 - 07/04 0700   P.O. 660 180   I.V. (mL/kg) 3 (0)    Total Intake(mL/kg) 663 (7.9) 180 (2.1)   Urine (mL/kg/hr) 1175 (0.6) 840 (0.9)   Stool     Total Output 1175 840   Net -512 -660         Awake, alert, oriented Speech fluent CN grossly intact MAE well  LABS: Lab Results  Component Value Date   CREATININE 0.98 02/05/2016   BUN 20 02/05/2016   NA 137 02/05/2016   K 3.7 02/05/2016   CL 106 02/05/2016   CO2 23 02/05/2016   Lab Results  Component Value Date   WBC 10.0 02/06/2016   HGB 11.0* 02/06/2016   HCT 34.0* 02/06/2016   MCV 96.0 02/06/2016   PLT 181 02/06/2016    IMPRESSION: - 82 y.o. male [place] d# 7 s/p Acom coiling. Fluid overload seems to be improving. Neurologically stable  PLAN: - Cont Nimotop, TCD monitoring - Lasix per PCCM

## 2016-02-06 NOTE — Progress Notes (Signed)
Transcranial Doppler  Date POD PCO2 HCT BP  MCA ACA PCA OPHT SIPH VERT Basilar  02/01/16 MS RS   35.5 138/57 Right  Left   53  55   -66  -41   39  40   20  18   -45  48   -45  -37   -24      02/03/16 JE    116/66 Right  Left   77  62   -70  -48   33  36   25  29   -50  -48   -39  -32   *      08/09/15 vs     Right  Left   70  64   -64  -46   34  39   18  15   26  25    -44  -36     -26          Right  Left                                             Right  Left                                            Right  Left                                            Right  Left                                        MCA = Middle Cerebral Artery      OPHT = Opthalmic Artery     BASILAR = Basilar Artery   ACA = Anterior Cerebral Artery     SIPH = Carotid Siphon PCA = Posterior Cerebral Artery   VERT = Verterbral Artery                   Normal MCA = 62+\-12 ACA = 50+\-12 PCA = 42+\-23   * Unable to insonate basilar 6/30 JE   02/06/2016 9:40 AM

## 2016-02-06 NOTE — Progress Notes (Addendum)
PULMONARY / CRITICAL CARE MEDICINE   Name: Jason Hawkins MRN: [id number] DOB: [birth year]    ADMISSION DATE:  01/31/2016 CONSULTATION DATE:  02/04/2016 LOS 6 days   REFERRING MD:  Dr Saintclair Halsted  CHIEF COMPLAINT:  Acute resp failure brief  82 year old maleith history of atrial fibrillation on chronic Coumadin with a history of therapeutic INR for the last 12 years. He also has a history of aortic valve replacement. He lives in [place], [place]. History is given by Dr. Leonie Man  the neurologist, review of the chart, the patient, his wife and bedside nurse admitted 01/31/2016. 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 His CPK center and vitamin K. Subsequently status post coiling without stent placement. His Coumadin was stopped. He was placed on aspirin, and nimodipine. According to him and his wife overall he has been feeling well but has been requiring some oxygen.. Wilburn Mylar 02/03/2016 feeling nauseated but CT head showed improvement in his intracranial bleed. Overnight developed chest pressure and a feeling of something in his chest particularly mucus that he is not able to bring out. He is asking for Mucinex. This was associated with worsening hypoxemia from 2 L to 4 L facemask. He attributes all this to being in bed for the last several days. Currently denies any chest pressure or wheezing or pedal edema. Review of the chart shows that he is on normal saline 100 mL per hour and is 4 L positive according to the nurse since admission. This no hemoptysis or sputum or fever. Pulmonary critical care consulted 02/04/2016 of note he has been off Coumadin since admission.  Last echocardiogram in our system was in 2009 and last chest x-ray was in 2005. Chest x-ray just done in my personal visualization shows pulmonary edema pattern and right sided however   SUBJECTIVE/OVERNIGHT/INTERVAL HX No events overnight, more lethargic this AM.  VITAL SIGNS: BP 155/76 mmHg   Pulse 92  Temp(Src) 100 F (37.8 C) (Oral)  Resp 28  Ht 5\' 9"  (1.753 m)  Wt 84.369 kg (186 lb)  BMI 27.45 kg/m2  SpO2 98%  HEMODYNAMICS:    VENTILATOR SETTINGS: Vent Mode:  [-]  FiO2 (%):  [30 %] 30 %  INTAKE / OUTPUT: I/O last 3 completed shifts: In: 663 [P.O.:660; I.V.:3] Out: 2825 [Urine:2825]  PHYSICAL EXAMINATION: General:  Elderly male. Sitting in the bed facemask oxygen on Neuro:  Denies any headache. He was equal and reactive to light. Moves all 4 extremities normally. No focal deficits. Alert and oriented 3.  Sleepy but easily arousable. HEENT:  Facemask oxygen on. No elevated JVP no neck nodes Cardiovascular:  Irregularly irregular no murmurs Lungs:  Overall air entry is diminished some scattered bibasal crackles Abdomen:  Soft nontender no organomegaly Musculoskeletal:  Chronic venous stasis edema present. No warmth no unilateral swelling Skin:  Intact in the exposed areas.  LABS: PULMONARY  Recent Labs Lab 01/31/16 2119  TCO2 29   CBC  Recent Labs Lab 02/04/16 1647 02/05/16 0655 02/06/16 0331  HGB 12.2* 11.4* 11.0*  HCT 37.1* 35.0* 34.0*  WBC 13.7* 12.4* 10.0  PLT 92* 161 181   COAGULATION  Recent Labs Lab 02/01/16 0715 02/01/16 1236 02/01/16 2000 02/02/16 0613 02/03/16 0940  INR 1.24 1.17 1.18 1.25 1.28   CARDIAC   Recent Labs Lab 02/04/16 1647 02/04/16 1832 02/05/16 0216  TROPONINI 0.05* 0.05* 0.05*   No results for input(s): PROBNP in the last 168 hours.  CHEMISTRY  Recent Labs Lab 01/31/16 2106 01/31/16 2119 02/04/16  1647 02/05/16 0655 02/06/16 0331  NA 137 139 137 137  --   K 3.8 4.1 4.0 3.7  --   CL 102 100* 109 106  --   CO2 28  --  19* 23  --   GLUCOSE 107* 107* 118* 116*  --   BUN 12 15 19 20   --   CREATININE 0.91 0.90 0.94 0.98  --   CALCIUM 9.6  --  8.5* 8.5*  --   MG  --   --   --  1.7  --   PHOS  --   --   --   --  2.6   Estimated Creatinine Clearance: 54.1 mL/min (by C-G formula based on Cr of  0.98).  LIVER  Recent Labs Lab 01/31/16 2106  02/01/16 0715 02/01/16 1236 02/01/16 2000 02/02/16 0613 02/03/16 0940  AST 26  --   --   --   --   --   --   ALT 16*  --   --   --   --   --   --   ALKPHOS 69  --   --   --   --   --   --   BILITOT 1.2  --   --   --   --   --   --   PROT 7.8  --   --   --   --   --   --   ALBUMIN 4.3  --   --   --   --   --   --   INR 1.86*  < > 1.24 1.17 1.18 1.25 1.28  < > = values in this interval not displayed.  INFECTIOUS  Recent Labs Lab 02/04/16 1647 02/05/16 0216 02/05/16 0655 02/06/16 0331  LATICACIDVEN 1.8  --  0.8  --   PROCALCITON <0.10 <0.10  --  <0.10   ENDOCRINE CBG (last 3)  No results for input(s): GLUCAP in the last 72 hours.  IMAGING x48h  - image(s) personally visualized  -   highlighted in bold Dg Chest Port 1 View  02/05/2016  CLINICAL DATA:  Acute respiratory failure Fever of 100 this morning O2 at 96% EXAM: PORTABLE CHEST - 1 VIEW COMPARISON:  the previous day's study FINDINGS: Slight improvement in the focal right upper and lower lobe airspace disease. Left retrocardiac consolidation/atelectasis persists. Heart size upper limits normal.  Previous CABG and AVR. Small pleural effusions persist. Degenerative changes in the right shoulder. IMPRESSION: 1. Slight improvement in right lung airspace disease. 2. Small effusions with persistent left retrocardiac consolidation/atelectasis. Electronically Signed   By: Lucrezia Europe M.D.   On: 02/05/2016 08:40   Dg Chest Port 1 View  02/04/2016  CLINICAL DATA:  Shortness of Breath EXAM: PORTABLE CHEST 1 VIEW COMPARISON:  10/19/2003 FINDINGS: Cardiac shadow is mildly enlarged. Postsurgical changes are again seen. Diffuse patchy infiltrates are noted throughout the right lung as well as to a lesser degree in the left lung base. No bony abnormality is noted. IMPRESSION: Bilateral airspace opacities right greater than left. Followup examination is recommended. Electronically Signed   By: Inez Catalina M.D.   On: 02/04/2016 10:11   I reviewed CXR myself, small effusion and improved right lung airspace disease.  ASSESSMENT / PLAN:  PULMONARY A: Acute postoperative hypoxemic respiratory failure   - most likely acute diast chf ; possible HCAP but doubt PE  - improved 02/05/16 with lasix and on room air but has new  fever with rise WBC though biomarkers normal  P:   - Aggressive pulmonary toilet with Mucinex and incentive spirometry;; help with possible atelectasis component - Change lasix to po bid - If fever respikes, start antibiotics (already cultured) - SCDs for DVT prophylaxis  AMS: due to aneurysmal rupture:  - Minimize sedation.  - Control BP as below.  HTN and concern for spasm  - PO Nimodipine.  Discussed with bedside RN.  PCCM will sign off, please call back if needed.   Rush Farmer, M.D. [facility] Pulmonary/Critical Care Medicine. Pager: [phone]. After hours pager: [phone].  02/06/2016 9:01 AM

## 2016-02-06 NOTE — Progress Notes (Signed)
Physical Therapy Treatment Patient Details Name: Jason Hawkins MRN: [id number] DOB: 09-30-1927 Today's Date: 02/06/2016    History of Present Illness 82 y.o. male with history of atrial fibrillation on coumadin, aortic valve replacement, hypertension, hypercholesterolemia and lower extremity edema presenting with sudden onset severe HA. Blood pressure elevated. CT showed [facility]    PT Comments    Patient seen for mobility progression. Max encouragement to participate. Today's session limited by increased complaints of headache and nausea with mobility. Pt holding his head in his hands once sitting EOB stating this is the "worst headache he has had". Pt unable to maintain static sitting EOB and demonstrated posterior lean sitting and standing requiring increased physical assist (2 persons) compared to previous session. Unable to tolerate or perform ambulation activity this session. VSS on supplemental O2 via face mask at 3 liters. Patient unable to tolerate further activity and with functional change and concerns re: headache and nausea, assisted patient back to bed. Discussed concerns with nursing. Will continue to see and progress as tolerated.     Follow Up Recommendations  Home health PT;Supervision/Assistance - 24 hour     Equipment Recommendations  None recommended by PT    Recommendations for Other Services       Precautions / Restrictions Precautions Precautions: Fall (Simultaneous filing. User may not have seen previous data.) Restrictions Weight Bearing Restrictions: No    Mobility  Bed Mobility Overal bed mobility: +2 for physical assistance;Needs Assistance Bed Mobility: Supine to Sit;Sit to Supine     Supine to sit: Min assist;+2 for physical assistance Sit to supine: Min assist;+2 for physical assistance   General bed mobility comments: once sitting complained of nausea. unable to maintian sitting EOB  Transfers Overall transfer level: Needs assistance Equipment  used: 2 person hand held assist Transfers: Sit to/from Stand Sit to Stand: Min assist;+2 physical assistance         General transfer comment: posterior lean  Ambulation/Gait                 Stairs            Wheelchair Mobility    Modified Rankin (Stroke Patients Only)       Balance Overall balance assessment: Needs assistance   Sitting balance-Leahy Scale: Poor Sitting balance - Comments: posterior lean     Standing balance-Leahy Scale: Poor Standing balance comment: +2 requried. posterior lean                    Cognition Arousal/Alertness: Awake/alert Behavior During Therapy: WFL for tasks assessed/performed;Impulsive Overall Cognitive Status: Impaired/Different from baseline Area of Impairment: Attention;Safety/judgement   Current Attention Level: Sustained     Safety/Judgement: Decreased awareness of safety     General Comments: most likely affected by pain today    Exercises      General Comments        Pertinent Vitals/Pain Pain Assessment: Faces Pain Score: 7  Faces Pain Scale: Hurts whole lot Pain Location: headache Pain Descriptors / Indicators: Headache Pain Intervention(s): Limited activity within patient's tolerance;Monitored during session;Relaxation;Other (comment)    Home Living                      Prior Function            PT Goals (current goals can now be found in the care plan section) Acute Rehab PT Goals Patient Stated Goal: to go home PT Goal Formulation: With patient Time For Goal Achievement: 02/16/16  Potential to Achieve Goals: Good Progress towards PT goals: Not progressing toward goals - comment (limited by pain and nausea today)    Frequency  Min 3X/week    PT Plan Current plan remains appropriate    Co-evaluation             End of Session Equipment Utilized During Treatment: Gait belt Activity Tolerance: Patient tolerated treatment well Patient left: in bed;with call  bell/phone within reach;with bed alarm set;with family/visitor present     Time: 1035-1105 PT Time Calculation (min) (ACUTE ONLY): 30 min  Charges:  $Therapeutic Activity: 8-22 mins                    G CodesDuncan Dull 02-29-2016, 1:51 PM Alben Deeds, [place] DPT  [phone]

## 2016-02-06 NOTE — Progress Notes (Signed)
VASCULAR LAB PRELIMINARY  PRELIMINARY  PRELIMINARY  PRELIMINARY  Transcranial Doppler  Date POD PCO2 HCT BP  MCA ACA PCA OPHT SIPH VERT Basilar  02/06/16/vs     Right  Left   70 64     -64  -46   34  39   18  15   26  25    -44  -36     -26         Right  Left                                            Right  Left                                             Right  Left                                             Right  Left                                            Right  Left                                            Right  Left                                        MCA = Middle Cerebral Artery      OPHT = Opthalmic Artery     BASILAR = Basilar Artery   ACA = Anterior Cerebral Artery     SIPH = Carotid Siphon PCA = Posterior Cerebral Artery   VERT = Verterbral Artery                   Normal MCA = 62+\-12 ACA = 50+\-12 PCA = 42+\-23         Karlee Staff, RVS 02/06/2016, 9:37 AM

## 2016-02-06 NOTE — Progress Notes (Addendum)
STROKE TEAM PROGRESS NOTE   SUBJECTIVE (INTERVAL HISTORY) His son and dtr-in-law are at the bedside. Slow progress per son - still with HA but 2/10. Pt and family understands the plan for anticoagulation.    OBJECTIVE Temp:  [98.3 F (36.8 C)-100.1 F (37.8 C)] 100 F (37.8 C) (07/03 0747) Pulse Rate:  [56-104] 92 (07/03 0800) Cardiac Rhythm:  [-] Atrial fibrillation (07/03 0800) Resp:  [16-28] 28 (07/03 0800) BP: (117-181)/(51-90) 155/76 mmHg (07/03 0800) SpO2:  [90 %-99 %] 98 % (07/03 0800) FiO2 (%):  [30 %] 30 % (07/03 0800)  CBC:   Recent Labs Lab 02/04/16 1647 02/05/16 0655 02/06/16 0331  WBC 13.7* 12.4* 10.0  NEUTROABS 10.3*  --  6.7  HGB 12.2* 11.4* 11.0*  HCT 37.1* 35.0* 34.0*  MCV 97.6 97.5 96.0  PLT 92* 161 0000000    Basic Metabolic Panel:   Recent Labs Lab 02/04/16 1647 02/05/16 0655 02/06/16 0331  NA 137 137  --   K 4.0 3.7  --   CL 109 106  --   CO2 19* 23  --   GLUCOSE 118* 116*  --   BUN 19 20  --   CREATININE 0.94 0.98  --   CALCIUM 8.5* 8.5*  --   MG  --  1.7  --   PHOS  --   --  2.6    Lipid Panel:     Component Value Date/Time   CHOL 214* 01/04/2016 1106   TRIG 93.0 01/04/2016 1106   HDL 60.50 01/04/2016 1106   CHOLHDL 4 01/04/2016 1106   VLDL 18.6 01/04/2016 1106   LDLCALC 134* 01/04/2016 1106   Urine Drug Screen:     Component Value Date/Time   LABOPIA NONE DETECTED 01/31/2016 2117   COCAINSCRNUR NONE DETECTED 01/31/2016 2117   LABBENZ NONE DETECTED 01/31/2016 2117   AMPHETMU NONE DETECTED 01/31/2016 2117   THCU NONE DETECTED 01/31/2016 2117   LABBARB NONE DETECTED 01/31/2016 2117      IMAGING I have personally reviewed the radiological images below and agree with the radiology interpretations.  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 This is presumably the cause of acute subarachnoid hemorrhage. No other aneurysm. Mild  atherosclerotic disease the carotid bifurcation bilaterally without significant carotid stenosis. No significant vertebral stenosis.    Ct Head Wo Contrast  02/03/2016  IMPRESSION: Satisfactory appearance status post coiling of an ACom aneurysm. No postprocedural complications.  No intervening hydrocephalus.   01/31/2016  IMPRESSION: Subarachnoid hemorrhage as described above. A definitive aneurysm is not noted on this exam.   TTE - Left ventricle: The cavity size was normal. Wall thickness was  increased in a pattern of mild LVH. Systolic function was normal.  The estimated ejection fraction was in the range of 55% to 60%.  Wall motion was normal; there were no regional wall motion  abnormalities. - Aortic valve: A bioprosthesis was present and functioning  normally. The prosthesis had a normal range of motion. There was  mild regurgitation. Valve area (VTI): 1.77 cm^2. Valve area  (Vmax): 1.73 cm^2. Valve area (Vmean): 1.66 cm^2. - Mitral valve: There was mild regurgitation. - Left atrium: The atrium was severely dilated. - Right atrium: The atrium was mildly to moderately dilated. - Tricuspid valve: There was moderate regurgitation. - Pulmonary arteries: Systolic pressure was moderately increased.  PA peak pressure: 51 mm Hg (S).  PHYSICAL EXAM Pleasant elderly Caucasian male currently on face mask, but denies SOB. Afebrile. Head is nontraumatic. Neck is supple without bruit. Cardiac exam no murmur or gallop, irregularly irregular heart rate and rhythm. Lungs are clear to auscultation. Distal pulses are well felt. Neurological Exam ;  Awake, eyes closed but alert oriented x 3. Normal speech and language.eye movements full without nystagmus.fundi were not visualized. Vision acuity and fields appear normal. Hearing is normal. Palatal movements are normal. Face symmetric. Tongue midline. Normal strength, tone, reflexes and coordination. Normal sensation. Gait  deferred.   ASSESSMENT/PLAN [name] is a 82 y.o. male with history of atrial fibrillation on coumadin, aortic valve replacement, hypertension, hypercholesterolemia and lower extremity edema presenting with sudden onset severe HA. Blood pressure elevated. CT showed [facility].   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  TCDs so far no evidence of vasospasm  On nimodipine  LE dopplers negative for DVT  2D echo EF 55-60%, no SOE, prosthetic AV normal range of fxn  SCDs for VTE prophylaxis Diet Heart Room service appropriate?: Yes; Fluid consistency:: Thin  warfarin daily prior to admission. INR 1.8. Reversal received. Now on aspirin 325 mg daily. Plan anticoagulation this week if stable per NS  Therapy recommendations:  HH PT, OT  Disposition:  Return home with OP therapies  Atrial Fibrillation  Home anticoagulation:  warfarin daily   INR 1.8 on admission  Received reversal agent  On aspirin 325 now, plan transition to NOAC this week per NS    Hypertensive Emergency  BP 220/109 on admission in setting of neurologic symptoms  Started on cardene drip, now off  SBP goal <  160  Start on norvasc 2.5 mg daily.  On nimondipine for [facility] which will also lower BP  BP stable  Continue to monitor  Other Stroke Risk Factors  Advanced age  ETOH use, advised to drink no more than 2 drink(s) a day  Severe aortic stenosis s/p AVR [id number]  Acute diastolic CHF, on lasix and room air. Aggressive pulmonary toilet ongoing  Hospital day # 6  Neurology will sign off. Please call with questions. No neurology follow up needed at this time. Thanks for the consult.  Rosalin Hawking, MD PhD Stroke Neurology 02/06/2016 3:44 PM   To contact Stroke Continuity provider, please refer to [email or web address] After hours, contact General Neurology

## 2016-02-06 NOTE — Progress Notes (Signed)
Occupational Therapy Treatment Patient Details Name: Jason Hawkins MRN: [id number] DOB: 23-Apr-1928 Today's Date: 02/06/2016    History of present illness 82 y.o. male with history of atrial fibrillation on coumadin, aortic valve replacement, hypertension, hypercholesterolemia and lower extremity edema presenting with sudden onset severe HA. Blood pressure elevated. CT showed [facility].   OT comments  Session limited today by increased complaints of headache and nausea with mobility. Pt holding his head in his hands once sitting EOB. Pt unable to maintain static sitting EOB and demonstrated posterior lean sitting and standing. Due to not feeling well and decline in functional status from last session, pt returned to supine. Discussed concerns with nursing. If pt progresses, HHOT is appropriate, however, if pt declines in functional status, he may require more intense rehab. Will follow and continue to monitor.   Follow Up Recommendations  Home health OT;Supervision/Assistance - 24 hour    Equipment Recommendations  None recommended by OT    Recommendations for Other Services      Precautions / Restrictions Precautions Precautions: Fall (Simultaneous filing. User may not have seen previous data.) Restrictions Weight Bearing Restrictions: No       Mobility Bed Mobility Overal bed mobility: +2 for physical assistance;Needs Assistance Bed Mobility: Supine to Sit;Sit to Supine     Supine to sit: Min assist;+2 for physical assistance Sit to supine: Min assist;+2 for physical assistance   General bed mobility comments: once sitting complained of nausea. unable to maintain sitting EOB  Transfers Overall transfer level: Needs assistance Equipment used: 2 person hand held assist Transfers: Sit to/from Stand Sit to Stand: Min assist;+2 physical assistance         General transfer comment: posterior lean    Balance Overall balance assessment: Needs assistance   Sitting balance-Leahy  Scale: Poor Sitting balance - Comments: posterior lean     Standing balance-Leahy Scale: Poor Standing balance comment: +2 requried. posterior lean                   ADL                                       Functional mobility during ADLs: +2 for physical assistance;Minimal assistance General ADL Comments: limited by pain today. able to wipe face and drink      Vision      appears intact               Perception     Praxis      Cognition   Behavior During Therapy: [facility] for tasks assessed/performed;Impulsive Overall Cognitive Status: Impaired/Different from baseline Area of Impairment: Attention;Safety/judgement   Current Attention Level: Sustained      Safety/Judgement: Decreased awareness of safety     General Comments: most likely affected by pain today    Extremity/Trunk Assessment   BUE strength appears symmetrical.generalized weakness            Exercises     Shoulder Instructions       General Comments  Pt aware of his decreased ability to participate with therapy today.    Pertinent Vitals/ Pain       Pain Assessment: Faces (Simultaneous filing. User may not have seen previous data.) Pain Score: 7  Faces Pain Scale: Hurts whole lot Pain Location: headache (Simultaneous filing. User may not have seen previous data.) Pain Descriptors / Indicators: Headache (Simultaneous filing. User may not  have seen previous data.) Pain Intervention(s): Limited activity within patient's tolerance;Monitored during session;Relaxation;Other (comment) (nsg notified Simultaneous filing. User may not have seen previous data.)  Home Living                                          Prior Functioning/Environment              Frequency Min 2X/week     Progress Toward Goals  OT Goals(current goals can now be found in the care plan section)  Progress towards OT goals: Not progressing toward goals - comment (due to  pain)  Acute Rehab OT Goals Patient Stated Goal: to go home OT Goal Formulation: With patient/family Time For Goal Achievement: 02/17/16 Potential to Achieve Goals: Good ADL Goals Pt Will Perform Grooming: with modified independence;standing Pt Will Perform Upper Body Bathing: with modified independence;sitting;standing Pt Will Perform Lower Body Bathing: with modified independence;sit to/from stand Pt Will Transfer to Toilet: with modified independence;ambulating;regular height toilet Pt Will Perform Toileting - Clothing Manipulation and hygiene: with modified independence;sit to/from stand Pt/caregiver will Perform Home Exercise Program: Increased strength;Both right and left upper extremity;With theraband;Independently;With written HEP provided  Plan Discharge plan remains appropriate    Co-evaluation                 End of Session Equipment Utilized During Treatment: Oxygen (4L)   Activity Tolerance Patient limited by pain   Patient Left in bed;with call bell/phone within reach;with bed alarm set;with SCD's reapplied   Nurse Communication Mobility status;Other (comment) (concern over decline in functional status)        Time: 1035-1105 OT Time Calculation (min): 30 min  Charges: OT General Charges $OT Visit: 1 Procedure OT Treatments $Self Care/Home Management : 8-22 mins  Jeylin Woodmansee,H[place] 02/06/2016, 11:24 AM   Maurie Boettcher, OTR/L  [phone] 02/06/2016

## 2016-02-06 NOTE — Progress Notes (Signed)
Dr Kathyrn Sheriff notified of patient with continued lethargy and worsening headache accompanied with nausea, Pt able to follow commands and have conversations. TCD results from this morning read to him, no new orders received. Will continue to monitor.

## 2016-02-07 LAB — CBC WITH DIFFERENTIAL/PLATELET
Basophils Absolute: 0 10*3/uL (ref 0.0–0.1)
Basophils Relative: 0 %
EOS PCT: 3 %
Eosinophils Absolute: 0.3 10*3/uL (ref 0.0–0.7)
HEMATOCRIT: 37.2 % — AB (ref 39.0–52.0)
Hemoglobin: 12.2 g/dL — ABNORMAL LOW (ref 13.0–17.0)
LYMPHS ABS: 1.7 10*3/uL (ref 0.7–4.0)
LYMPHS PCT: 16 %
MCH: 31.1 pg (ref 26.0–34.0)
MCHC: 32.8 g/dL (ref 30.0–36.0)
MCV: 94.9 fL (ref 78.0–100.0)
MONO ABS: 1.5 10*3/uL — AB (ref 0.1–1.0)
MONOS PCT: 14 %
Neutro Abs: 7 10*3/uL (ref 1.7–7.7)
Neutrophils Relative %: 67 %
PLATELETS: 219 10*3/uL (ref 150–400)
RBC: 3.92 MIL/uL — ABNORMAL LOW (ref 4.22–5.81)
RDW: 13.6 % (ref 11.5–15.5)
WBC: 10.5 10*3/uL (ref 4.0–10.5)

## 2016-02-07 LAB — PHOSPHORUS: Phosphorus: 3.2 mg/dL (ref 2.5–4.6)

## 2016-02-07 NOTE — Progress Notes (Signed)
Physical Therapy Treatment Patient Details Name: Jason Hawkins MRN: [id number] DOB: February 18, 1928 Today's Date: 02/07/2016    History of Present Illness 82 y.o. male with history of atrial fibrillation on coumadin, aortic valve replacement, hypertension, hypercholesterolemia and lower extremity edema presenting with sudden onset severe HA. Blood pressure elevated. CT showed [facility]    PT Comments    Patient seen for mobility progression today, some improvements over yesterdays session but still worse then he had been doing during previous sessions. Patient continues to require 2 person assist today and was limited in balance and overall activity tolerance. Ambulated on room air with desaturation to 89%, improved with rest and use of 1.5 liters supplemental O2 upon return to room. Will continue to see and progress as tolerated. May need to consider use of RW nest session.  Follow Up Recommendations  Home health PT;Supervision/Assistance - 24 hour     Equipment Recommendations  None recommended by PT    Recommendations for Other Services       Precautions / Restrictions Precautions Precautions: Fall Restrictions Weight Bearing Restrictions: No    Mobility  Bed Mobility Overal bed mobility: Needs Assistance Bed Mobility: Supine to Sit     Supine to sit: Min guard     General bed mobility comments: min guard to come to EOB  Transfers Overall transfer level: Needs assistance Equipment used: 1 person hand held assist   Sit to Stand: Min assist;+2 physical assistance         General transfer comment: min assist to power up to standing, increased assist or 2 persons min assist for stability in standing, posterior lean with patient supporting calves against side of bed to prevent falling back despite assist. multi-modal cues to faciliate positioning over BOS  Ambulation/Gait Ambulation/Gait assistance: Mod assist;+2 physical assistance Ambulation Distance (Feet): 90  Feet Assistive device: 2 person hand held assist Gait Pattern/deviations: Step-through pattern;Decreased stride length;Drifts right/left;Narrow base of support Gait velocity: decreased Gait velocity interpretation: Below normal speed for age/gender General Gait Details: noted BLE weakness with ambulation, 2 person min to moderate assist to maintain stability. One standing rest break required. Tactile cues for pacing provided.   Stairs            Wheelchair Mobility    Modified Rankin (Stroke Patients Only)       Balance   Sitting-balance support: Feet supported Sitting balance-Leahy Scale: Poor Sitting balance - Comments: continues to have slight posterior lean while sitting EOB but able to self correct with cues today     Standing balance-Leahy Scale: Poor Standing balance comment: required increased assist for stability in standing                    Cognition Arousal/Alertness: Awake/alert Behavior During Therapy: WFL for tasks assessed/performed Overall Cognitive Status: Within Functional Limits for tasks assessed                      Exercises      General Comments        Pertinent Vitals/Pain Pain Assessment: 0-10 Pain Score: 3  Pain Location: head Pain Descriptors / Indicators: Headache Pain Intervention(s): Monitored during session;Premedicated before session    Home Living                      Prior Function            PT Goals (current goals can now be found in the care plan section) Acute  Rehab PT Goals Patient Stated Goal: to go home PT Goal Formulation: With patient Time For Goal Achievement: 02/16/16 Potential to Achieve Goals: Good Progress towards PT goals: Progressing toward goals (modestly)    Frequency  Min 3X/week    PT Plan Current plan remains appropriate    Co-evaluation             End of Session Equipment Utilized During Treatment: Gait belt;Oxygen Activity Tolerance: Patient tolerated  treatment well Patient left: in chair;with call bell/phone within reach;with chair alarm set     Time: [id number] PT Time Calculation (min) (ACUTE ONLY): 21 min  Charges:  $Gait Training: 8-22 mins                    G CodesDuncan Dull 17-Feb-2016, 9:06 AM Alben Deeds, PT DPT  [phone]

## 2016-02-07 NOTE — Progress Notes (Signed)
Patient ID: Jason Hawkins, male   DOB: [birth year], 82 y.o.   MRN: [id number] BP 112/59 mmHg  Pulse 72  Temp(Src) 98.5 F (36.9 C) (Oral)  Resp 26  Ht 5\' 9"  (1.753 m)  Wt 84.369 kg (186 lb)  BMI 27.45 kg/m2  SpO2 91% Alert and oriented x 4, speech is clear, and fluent Perrl, full eom Symmetric facies, tongue and uvula midline Normal shoulder shrug Moving all extremities well Headache is improved. Continue current measures.

## 2016-02-08 ENCOUNTER — Inpatient Hospital Stay (HOSPITAL_COMMUNITY): Payer: Medicare Other

## 2016-02-08 DIAGNOSIS — I609 Nontraumatic subarachnoid hemorrhage, unspecified: Secondary | ICD-10-CM

## 2016-02-08 LAB — PHOSPHORUS: PHOSPHORUS: 2.9 mg/dL (ref 2.5–4.6)

## 2016-02-08 LAB — CBC WITH DIFFERENTIAL/PLATELET
BASOS ABS: 0 10*3/uL (ref 0.0–0.1)
BASOS PCT: 0 %
EOS PCT: 3 %
Eosinophils Absolute: 0.3 10*3/uL (ref 0.0–0.7)
HCT: 36 % — ABNORMAL LOW (ref 39.0–52.0)
Hemoglobin: 11.8 g/dL — ABNORMAL LOW (ref 13.0–17.0)
LYMPHS PCT: 13 %
Lymphs Abs: 1.3 10*3/uL (ref 0.7–4.0)
MCH: 31.6 pg (ref 26.0–34.0)
MCHC: 32.8 g/dL (ref 30.0–36.0)
MCV: 96.3 fL (ref 78.0–100.0)
MONO ABS: 1.3 10*3/uL — AB (ref 0.1–1.0)
Monocytes Relative: 14 %
Neutro Abs: 6.9 10*3/uL (ref 1.7–7.7)
Neutrophils Relative %: 70 %
PLATELETS: 237 10*3/uL (ref 150–400)
RBC: 3.74 MIL/uL — AB (ref 4.22–5.81)
RDW: 13.2 % (ref 11.5–15.5)
WBC: 9.8 10*3/uL (ref 4.0–10.5)

## 2016-02-08 MED ORDER — KETOROLAC TROMETHAMINE 10 MG PO TABS
10.0000 mg | ORAL_TABLET | Freq: Four times a day (QID) | ORAL | Status: DC | PRN
  Administered 2016-02-08 – 2016-02-13 (×15): 10 mg via ORAL
  Filled 2016-02-08 (×19): qty 1

## 2016-02-08 MED ORDER — FUROSEMIDE 10 MG/ML IJ SOLN
10.0000 mg | Freq: Once | INTRAMUSCULAR | Status: AC
  Administered 2016-02-08: 10 mg via INTRAVENOUS
  Filled 2016-02-08: qty 2

## 2016-02-08 MED ORDER — IOPAMIDOL (ISOVUE-370) INJECTION 76%
50.0000 mL | Freq: Once | INTRAVENOUS | Status: AC | PRN
  Administered 2016-01-31: 50 mL via INTRAVENOUS

## 2016-02-08 NOTE — Progress Notes (Signed)
Occupational Therapy Treatment Patient Details Name: Jason Hawkins MRN: [id number] DOB: [birth year] Today's Date: 02/08/2016    History of present illness 82 y.o. male with history of atrial fibrillation on coumadin, aortic valve replacement, hypertension, hypercholesterolemia and lower extremity edema presenting with sudden onset severe HA. Blood pressure elevated. CT showed [facility]   OT comments  Pt making progress toward OT goals this session. Able to perform LB dressing and grooming activities standing at the sink with min assist overall. Pt c/o "heaviness" in chest and pain on buttocks when sitting in chair; noted to have a small, dime sized, sore on his bottom (PT notified RN). D/c plan remains appropriate. Will continue to follow acutely.   Follow Up Recommendations  Home health OT;Supervision/Assistance - 24 hour    Equipment Recommendations  None recommended by OT    Recommendations for Other Services      Precautions / Restrictions Precautions Precautions: Fall Restrictions Weight Bearing Restrictions: No       Mobility Bed Mobility Overal bed mobility: Needs Assistance Bed Mobility: Supine to Sit;Sit to Supine     Supine to sit: Supervision;HOB elevated Sit to supine: Supervision;HOB elevated   General bed mobility comments: Supervision for safety; no physical assist required. VCs for hand placement and positioning in bed.  Transfers Overall transfer level: Needs assistance Equipment used: Rolling walker (2 wheeled) Transfers: Sit to/from Stand Sit to Stand: Min assist;+2 physical assistance         General transfer comment: Min assist to boost up from EOB x2, chair x1. VCs for hand placement with RW.    Balance Overall balance assessment: Needs assistance Sitting-balance support: Feet supported;No upper extremity supported Sitting balance-Leahy Scale: Fair     Standing balance support: No upper extremity supported;During functional activity Standing  balance-Leahy Scale: Poor Standing balance comment: Min external assist provided for balance in standing. Pt with forward flexed posture during functional activities in standing, noted to be weight shifting posteriorly on heels.                   ADL Overall ADL's : Needs assistance/impaired     Grooming: Minimal assistance;Standing;Wash/dry hands Grooming Details (indicate cue type and reason): Min assist for balance in standing             Lower Body Dressing: Minimal assistance;Sit to/from stand Lower Body Dressing Details (indicate cue type and reason): Pt able to doff underwear with min assist for balance in standing. Min assist provided to get underwear started over feet. Pt able to don one foot but difficulty with other; increased SOB during activity. Toilet Transfer: Minimal assistance;Ambulation;+2 for safety/equipment Toilet Transfer Details (indicate cue type and reason): Simulated by sit to stand from EOB and functional mobility.         Functional mobility during ADLs: Minimal assistance;+2 for safety/equipment General ADL Comments: VSS throughout. Pt reports "heaviness" in chest with increase in SOB during functional activities; RN notified. Small, dime sized, sore noted on pts bottom; pt c/o pain with sitting in chair.      Vision                     Perception     Praxis      Cognition   Behavior During Therapy: [facility] for tasks assessed/performed Overall Cognitive Status: Within Functional Limits for tasks assessed     Current Attention Level: Selective        Awareness: Emergent Problem Solving: Requires verbal cues;Slow processing  Extremity/Trunk Assessment               Exercises     Shoulder Instructions       General Comments      Pertinent Vitals/ Pain       Pain Assessment: Faces Pain Score: 0-No pain Faces Pain Scale: Hurts little more Pain Location: head (intermittent) and "heaviness" in chest area Pain  Descriptors / Indicators: Heaviness;Sharp;Headache Pain Intervention(s): Monitored during session  Home Living                                          Prior Functioning/Environment              Frequency Min 2X/week     Progress Toward Goals  OT Goals(current goals can now be found in the care plan section)  Progress towards OT goals: Progressing toward goals  Acute Rehab OT Goals Patient Stated Goal: Increase strength OT Goal Formulation: With patient  Plan Discharge plan remains appropriate    Co-evaluation    PT/OT/SLP Co-Evaluation/Treatment: Yes Reason for Co-Treatment: For patient/therapist safety PT goals addressed during session: Mobility/safety with mobility;Balance;Proper use of DME OT goals addressed during session: ADL's and self-care;Other (comment) (functional mobility)      End of Session Equipment Utilized During Treatment: Gait belt;Rolling walker   Activity Tolerance Patient tolerated treatment well   Patient Left in bed;with call bell/phone within reach   Nurse Communication  (PT to notify RN)        Time: [id number] OT Time Calculation (min): 35 min  Charges: OT General Charges $OT Visit: 1 Procedure OT Treatments $Self Care/Home Management : 8-22 mins  Binnie Kand M.S., OTR/L Pager: [phone]  02/08/2016, 4:46 PM

## 2016-02-08 NOTE — Progress Notes (Signed)
VASCULAR LAB PRELIMINARY  PRELIMINARY  PRELIMINARY  PRELIMINARY  TCD completed completed.    Preliminary report:  Results with previous exams  Azul Brumett, RVS 02/08/2016, 5:39 PM

## 2016-02-08 NOTE — Progress Notes (Signed)
No issues overnight. Pt cont to have "pressure" on his chest, better when sitting upright. Has HA, unchanged.  EXAM:  BP 133/79 mmHg  Pulse 132  Temp(Src) 98.9 F (37.2 C) (Oral)  Resp 20  Ht 5\' 9"  (1.753 m)  Wt 84.369 kg (186 lb)  BMI 27.45 kg/m2  SpO2 94%  Awake, alert, oriented  Speech fluent, appropriate  CN grossly intact  5/5 BUE/BLE   IMPRESSION:  82 y.o. male SAH d#8 s/p Acom coiling. Diastolic dysfunction with tenuous fluid status.  PLAN: - Cont Nimotop, TCD. No spasm thus far. Will likely be ok for discharge later this week from neurologic standpoint - Will likely start Eliquis tomorrow for Afib per neurology

## 2016-02-08 NOTE — Progress Notes (Signed)
Physical Therapy Treatment Patient Details Name: Jason Hawkins MRN: [id number] DOB: 1928-07-27 Today's Date: 02/08/2016    History of Present Illness 82 y.o. male with history of atrial fibrillation on coumadin, aortic valve replacement, hypertension, hypercholesterolemia and lower extremity edema presenting with sudden onset severe HA. Blood pressure elevated. CT showed [facility]    PT Comments    Patient did well using the RW for assistance during gait. He frequently c/o SOB and chest heaviness during activity. The RN was informed. He seems to shift his weight to his heels during standing and compensates with a flexed trunk. Frequent VC were required to encourage pt to stand up straight. Pt expressed an interesting in doing more to increase his strength and endurence so he can get better. An HEP with LE exercises would be beneficial for next session.   Follow Up Recommendations  Home health PT;Supervision/Assistance - 24 hour     Equipment Recommendations  None recommended by PT       Precautions / Restrictions Precautions Precautions: Fall Restrictions Weight Bearing Restrictions: No    Mobility  Bed Mobility Overal bed mobility: Needs Assistance Bed Mobility: Supine to Sit;Sit to Supine     Supine to sit: Supervision Sit to supine: Supervision   General bed mobility comments: Patient able to [facility] in bed with VC for hand placement  Transfers Overall transfer level: Needs assistance Equipment used: Rolling walker (2 wheeled) Transfers: Sit to/from Stand Sit to Stand: Min assist;+2 physical assistance;+2 safety/equipment            Ambulation/Gait Ambulation/Gait assistance: Min assist;+2 safety/equipment Ambulation Distance (Feet): 100 Feet Assistive device: Rolling walker (2 wheeled) Gait Pattern/deviations: Step-through pattern;Shuffle;Trunk flexed   Gait velocity interpretation: Below normal speed for age/gender General Gait Details: Patient required VC for  proper hand placement on the walker and to stand up straight. One rest during ambulation to adjust hight of walker. Patient was SOB during activity.    Balance Overall balance assessment: Needs assistance Sitting-balance support: Feet supported;Single extremity supported Sitting balance-Leahy Scale: Fair     Standing balance support: Bilateral upper extremity supported Standing balance-Leahy Scale: Poor Standing balance comment: Patient appeared to shift weight back on his heels and compensated by flexing his trunk. Frequent VC were required to remind patient to stand up straight. He performed an underwear change while standing at EOB. He used leaned the back of his LEs heavily on the bed for support during change. LOB x1. He fell from standing to sitting on bed.                    Cognition Arousal/Alertness: Awake/alert Behavior During Therapy: WFL for tasks assessed/performed Overall Cognitive Status: Within Functional Limits for tasks assessed     Current Attention Level: Selective       Awareness: Emergent Problem Solving: Requires verbal cues;Slow processing       Pertinent Vitals/Pain Pain Assessment: 0-10 Pain Score: 0-No pain     PT Goals (current goals can now be found in the care plan section) Acute Rehab PT Goals Patient Stated Goal: To be more active PT Goal Formulation: With patient Time For Goal Achievement: 02/16/16 Potential to Achieve Goals: Good Progress towards PT goals: Progressing toward goals    Frequency  Min 3X/week    PT Plan Current plan remains appropriate    Co-evaluation PT/OT/SLP Co-Evaluation/Treatment: Yes Reason for Co-Treatment: Complexity of the patient's impairments (multi-system involvement);For patient/therapist safety PT goals addressed during session: Mobility/safety with mobility;Balance;Proper use of DME  End of Session Equipment Utilized During Treatment: Gait belt Activity Tolerance: No increased  pain;Other (comment) (Patient c/o SOB and chest heaviness during activitiy. ) Patient left: in bed;with call bell/phone within reach;with bed alarm set     Time: [id number] PT Time Calculation (min) (ACUTE ONLY): 39 min  Charges:                       G CodesBenjiman Core, Dayle Points [phone]  02/08/2016, 4:30 PM

## 2016-02-08 NOTE — Progress Notes (Signed)
Transcranial Doppler  Date POD PCO2 HCT BP  MCA ACA PCA OPHT SIPH VERT Basilar  02/01/16 MS RS   35.5 138/57 Right  Left   53  55   -66  -41   39  40   20  18   -45  48   -45  -37   -24      02/03/16 JE    116/66 Right  Left   77  62   -70  -48   33  36   25  29   -50  -48   -39  -32   *      08/09/15 vs     Right  Left   70  64   -64  -46   34  39   18  15   26  25    -44  -36     -26    08/11/15 vs     Right  Left   45 55     -30  -31   28  46   22  14   19  26    -34  -37     -42          Right  Left                                            Right  Left                                            Right  Left                                        MCA = Middle Cerebral Artery      OPHT = Opthalmic Artery     BASILAR = Basilar Artery   ACA = Anterior Cerebral Artery     SIPH = Carotid Siphon PCA = Posterior Cerebral Artery   VERT = Verterbral Artery                   Normal MCA = 62+\-12 ACA = 50+\-12 PCA = 42+\-23   * Unable to insonate basilar 6/30 JE 02/08/16 Technically difficult to image   02/08/2016 5:40 PM

## 2016-02-09 ENCOUNTER — Other Ambulatory Visit (HOSPITAL_COMMUNITY): Payer: Medicare Other

## 2016-02-09 LAB — CBC WITH DIFFERENTIAL/PLATELET
Basophils Absolute: 0 10*3/uL (ref 0.0–0.1)
Basophils Relative: 0 %
EOS PCT: 1 %
Eosinophils Absolute: 0.1 10*3/uL (ref 0.0–0.7)
HCT: 39.8 % (ref 39.0–52.0)
Hemoglobin: 13.6 g/dL (ref 13.0–17.0)
LYMPHS ABS: 1.4 10*3/uL (ref 0.7–4.0)
LYMPHS PCT: 12 %
MCH: 32.2 pg (ref 26.0–34.0)
MCHC: 34.2 g/dL (ref 30.0–36.0)
MCV: 94.1 fL (ref 78.0–100.0)
MONO ABS: 1.2 10*3/uL — AB (ref 0.1–1.0)
Monocytes Relative: 10 %
Neutro Abs: 9.1 10*3/uL — ABNORMAL HIGH (ref 1.7–7.7)
Neutrophils Relative %: 77 %
PLATELETS: 295 10*3/uL (ref 150–400)
RBC: 4.23 MIL/uL (ref 4.22–5.81)
RDW: 12.9 % (ref 11.5–15.5)
WBC: 11.7 10*3/uL — ABNORMAL HIGH (ref 4.0–10.5)

## 2016-02-09 LAB — PHOSPHORUS: Phosphorus: 2.8 mg/dL (ref 2.5–4.6)

## 2016-02-09 NOTE — Progress Notes (Signed)
Pt seen and examined. Did get some lasix last night. Toradol seems to have helped HA. Chest tightness slightly improved. No SOB  EXAM: Temp:  [98 F (36.7 C)-98.9 F (37.2 C)] 98.5 F (36.9 C) (07/06 0000) Pulse Rate:  [68-153] 70 (07/06 0100) Resp:  [16-28] 18 (07/06 0100) BP: (125-180)/(56-112) 167/87 mmHg (07/06 0100) SpO2:  [92 %-99 %] 95 % (07/06 0100) Intake/Output      07/05 0701 - 07/06 0700   I.V. (mL/kg) 3 (0)   Total Intake(mL/kg) 3 (0)   Urine (mL/kg/hr) 1575 (0.8)   Stool 0 (0)   Total Output 1575   Net -1572       Urine Occurrence 1 x   Stool Occurrence 1 x    Awake, alert, oriented CN intact Good strength  LABS: Lab Results  Component Value Date   CREATININE 0.98 02/05/2016   BUN 20 02/05/2016   NA 137 02/05/2016   K 3.7 02/05/2016   CL 106 02/05/2016   CO2 23 02/05/2016   Lab Results  Component Value Date   WBC 9.8 02/08/2016   HGB 11.8* 02/08/2016   HCT 36.0* 02/08/2016   MCV 96.3 02/08/2016   PLT 237 02/08/2016   TCDs reviewed, not concerning for spasm  IMPRESSION: - 82 y.o. male [place] d#9 s/p Acom coiling, neurologically stable.  PLAN: - Cont to mobilize, can likely go home with HHPT/OT on Friday or Sat - Will need to complete 21 day course of Nimotop

## 2016-02-09 NOTE — Care Management Important Message (Signed)
Important Message  Patient Details  Name: Jason Hawkins MRN: [id number] Date of Birth: Apr 27, 1928   Medicare Important Message Given:  Yes    Loann Quill 02/09/2016, 10:31 AM

## 2016-02-09 NOTE — Progress Notes (Signed)
Patient ID: Jason Hawkins, male   DOB: 16-Apr-1928, 82 y.o.   MRN: [id number]  Alert, sitting up in bed reading. Conversant. MAEW. Reports intermittent mild headache, relieved by Tramadol. Possible discharge tomorrow. Per Dr. Vertell Limber, will need to stay off Coumadin until cleared by Dr. Kathyrn Sheriff in follow-up. (Coumadin hx for A. Fib, Aortic Valve) Should f/u in office within 2 weeks.   Verdis Prime RN BSN

## 2016-02-10 ENCOUNTER — Inpatient Hospital Stay (HOSPITAL_COMMUNITY): Payer: Medicare Other

## 2016-02-10 DIAGNOSIS — I609 Nontraumatic subarachnoid hemorrhage, unspecified: Secondary | ICD-10-CM

## 2016-02-10 NOTE — Progress Notes (Signed)
PT Cancellation Note  Patient Details Name: Jason Hawkins MRN: [id number] DOB: [birth year]   Cancelled Treatment:    Reason Eval/Treat Not Completed: Fatigue/lethargy limiting ability to participate   Duncan Dull 02/10/2016, 10:33 AM Alben Deeds, PT DPT  [phone]

## 2016-02-10 NOTE — Progress Notes (Signed)
Pt had 13 beat run V-tach, called Dr. Vertell Limber who is in the OR, he gave orders to perform an EKG and re-consult CCM. Spoke with CCM to let them know that Dr. Vertell Limber wanted to re-consult them - Dr. Nelda Marseille who is in a procedure at this time as well and his advice is to consult cardiology.   EKG will be done

## 2016-02-10 NOTE — Progress Notes (Signed)
Tech offered Pt a bath, Pt refused and stated he did not feel up to it and had a bath yesterday. Wife at the bedside and RN aware.

## 2016-02-10 NOTE — Progress Notes (Signed)
Occupational Therapy Treatment Patient Details Name: Jason Hawkins MRN: [id number] DOB: August 12, 1927 Today's Date: 02/10/2016    History of present illness 82 y.o. male with history of atrial fibrillation on coumadin, aortic valve replacement, hypertension, hypercholesterolemia and lower extremity edema presenting with sudden onset severe HA. Blood pressure elevated. CT showed SAH   OT comments  Pt found to be incontinent of urine at start of session; unable to identify that he was wet in bed. Pt required overall min assist for UB/LB dressing and LB bathing/peri care due to unsteadiness in standing. Pt with LOB x3 during functional activities in bathroom. D/c plan remains appropriate at this time; highly recommend pt has 24/7 supervision upon return home as he is not safe with functional activities at this time. Will continue to follow acutely.   Follow Up Recommendations  Home health OT;Supervision/Assistance - 24 hour    Equipment Recommendations  None recommended by OT    Recommendations for Other Services      Precautions / Restrictions Precautions Precautions: Fall Restrictions Weight Bearing Restrictions: No       Mobility Bed Mobility Overal bed mobility: Needs Assistance Bed Mobility: Supine to Sit     Supine to sit: Supervision;HOB elevated     General bed mobility comments: Close supervision for safety; no physical assist required.  Transfers Overall transfer level: Needs assistance Equipment used: Rolling walker (2 wheeled) Transfers: Sit to/from Stand Sit to Stand: Min assist         General transfer comment: Min assist for balance.    Balance Overall balance assessment: Needs assistance Sitting-balance support: Feet supported;No upper extremity supported Sitting balance-Leahy Scale: Fair     Standing balance support: No upper extremity supported;During functional activity Standing balance-Leahy Scale: Poor Standing balance comment: min assist  provided in standing with multiple LOB.                   ADL Overall ADL's : Needs assistance/impaired             Lower Body Bathing: Min guard;Sitting/lateral leans Lower Body Bathing Details (indicate cue type and reason): Pt able to clean bottom and upper legs sitting on commode with min guard assist for safety. Upper Body Dressing : Min guard;Set up;Sitting Upper Body Dressing Details (indicate cue type and reason): To doff/don hospital gown. Lower Body Dressing: Minimal assistance;Sit to/from stand;Cueing for safety Lower Body Dressing Details (indicate cue type and reason): Assist for balance in standing. Pt with LOB x3 when attempting to pull up underwear in bathroom. Toilet Transfer: Minimal assistance;Ambulation;Regular Toilet;RW;Grab bars Toilet Transfer Details (indicate cue type and reason): Min assist for balance. Toileting- Clothing Manipulation and Hygiene: Min guard;Sitting/lateral lean       Functional mobility during ADLs: Minimal assistance;Rolling walker General ADL Comments: Pt very unsteady at start of session and during ADL activity. Pt with posterior LOB x 2 and L lateral LOB x 1 during functional tasks in standing. Pt with urinary incontinence; unable to identify that he was wet in bed.      Vision                     Perception     Praxis      Cognition   Behavior During Therapy: Impulsive Overall Cognitive Status: Within Functional Limits for tasks assessed                  General Comments: Pt found to be incontinent of urine. Unable to identify he  was wet.    Extremity/Trunk Assessment               Exercises     Shoulder Instructions       General Comments      Pertinent Vitals/ Pain       Pain Assessment: Faces Faces Pain Scale: Hurts even more Pain Location: one instance of head hurting Pain Descriptors / Indicators: Sharp Pain Intervention(s): Monitored during session  Home Living                                           Prior Functioning/Environment              Frequency Min 2X/week     Progress Toward Goals  OT Goals(current goals can now be found in the care plan section)  Progress towards OT goals: Progressing toward goals  Acute Rehab OT Goals Patient Stated Goal: Increase strength OT Goal Formulation: With patient  Plan Discharge plan remains appropriate    Co-evaluation    PT/OT/SLP Co-Evaluation/Treatment: Yes Reason for Co-Treatment: For patient/therapist safety;Other (comment) (pts tolerance to activity)   OT goals addressed during session: ADL's and self-care;Other (comment) (functional mobility)      End of Session Equipment Utilized During Treatment: Gait belt;Rolling walker   Activity Tolerance Patient tolerated treatment well   Patient Left in chair;with call bell/phone within reach;with chair alarm set   Nurse Communication Other (comment) (incontinent)        Time: 1332-1410 OT Time Calculation (min): 38 min  Charges: OT General Charges $OT Visit: 1 Procedure OT Treatments $Self Care/Home Management : 8-22 mins  Binnie Kand M.S., OTR/L Pager: [phone]  02/10/2016, 3:02 PM

## 2016-02-10 NOTE — Progress Notes (Signed)
Subjective: Patient reports "I feel pretty crappy since those two horse pills"  Objective: Vital signs in last 24 hours: Temp:  [97.8 F (36.6 C)-98.7 F (37.1 C)] 98.4 F (36.9 C) (07/07 0400) Pulse Rate:  [43-85] 71 (07/07 0600) Resp:  [15-24] 19 (07/07 0600) BP: (111-176)/(51-97) 147/83 mmHg (07/07 0600) SpO2:  [89 %-97 %] 95 % (07/07 0600)  Intake/Output from previous day: 07/06 0701 - 07/07 0700 In: -  Out: 1435 [Urine:1435] Intake/Output this shift:    Alert, conversant. Reports feeling bad in general this morning, but no headache so far today.   Lab Results:  Recent Labs  02/08/16 0517 02/09/16 0440  WBC 9.8 11.7*  HGB 11.8* 13.6  HCT 36.0* 39.8  PLT 237 295   BMET No results for input(s): NA, K, CL, CO2, GLUCOSE, BUN, CREATININE, CALCIUM in the last 72 hours.  Studies/Results: Dg Chest Port 1 View  02/08/2016  CLINICAL DATA:  Dyspnea and lethargy for 1 day, history hypertension, atrial fibrillation, aortic stenosis post AVR, prostate cancer EXAM: PORTABLE CHEST 1 VIEW COMPARISON:  Portable exam 1953 hours compared to 02/05/2016 FINDINGS: Enlargement of cardiac silhouette post median sternotomy and AVR. Atherosclerotic calcification aortic arch. Mediastinal contours and pulmonary vascularity normal. Bibasilar atelectasis with improved aeration at LEFT base since previous exam. Tiny LEFT pleural effusion. Improved RIGHT upper lobe infiltrate. No pneumothorax. Degenerative changes RIGHT AC joint. IMPRESSION: Bibasilar atelectasis and tiny LEFT pleural effusion. Improved RIGHT upper lobe infiltrate. Improved aeration since previous exam. Enlargement of cardiac silhouette post AVR. Electronically Signed   By: Lavonia Dana M.D.   On: 02/08/2016 20:04    Assessment/Plan:   LOS: 10 days  Remain in Neuro ICU for now per Dr. Vertell Limber. Not appropriate for discharge yet.   Verdis Prime 02/10/2016, 7:52 AM

## 2016-02-10 NOTE — Progress Notes (Signed)
Physical Therapy Treatment Patient Details Name: Jason Hawkins MRN: [id number] DOB: Sep 17, 1927 Today's Date: 02/10/2016    History of Present Illness 82 y.o. male with history of atrial fibrillation on coumadin, aortic valve replacement, hypertension, hypercholesterolemia and lower extremity edema presenting with sudden onset severe HA. Blood pressure elevated. CT showed [facility]    PT Comments    Patient seen for therapy progression. Seen in conjunction with OT therapist due to patient tolerance and safety. Patient continues to required hands on physical assist for mobility and functional tasks. Ambulated around the unit with assist for balance and stability. Continues to rely heavily on RW for support. Will continue to see and progress as tolerated.  Follow Up Recommendations  Home health PT;Supervision/Assistance - 24 hour     Equipment Recommendations  None recommended by PT    Recommendations for Other Services       Precautions / Restrictions Precautions Precautions: Fall Restrictions Weight Bearing Restrictions: No    Mobility  Bed Mobility Overal bed mobility: Needs Assistance Bed Mobility: Supine to Sit     Supine to sit: Supervision;HOB elevated     General bed mobility comments: increased time to perform. Close supervision for safety; no physical assist required.  Transfers Overall transfer level: Needs assistance Equipment used: Rolling walker (2 wheeled) Transfers: Sit to/from Stand Sit to Stand: Min assist         General transfer comment: Min assist for balance.  Ambulation/Gait Ambulation/Gait assistance: Min assist (2 episodes of moderate assist during LOB) Ambulation Distance (Feet): 210 Feet Assistive device: Rolling walker (2 wheeled) Gait Pattern/deviations: Step-through pattern;Shuffle;Trunk flexed Gait velocity: decreased Gait velocity interpretation: Below normal speed for age/gender General Gait Details: Patient required VC for proper hand  placement on the walker and to stand up straight. One rest during ambulation to adjust hight of walker. Patient was SOB during activity.   Stairs            Wheelchair Mobility    Modified Rankin (Stroke Patients Only) Modified Rankin (Stroke Patients Only) Pre-Morbid Rankin Score: No symptoms Modified Rankin: Moderately severe disability     Balance Overall balance assessment: Needs assistance Sitting-balance support: Feet supported;No upper extremity supported Sitting balance-Leahy Scale: Fair     Standing balance support: No upper extremity supported;During functional activity Standing balance-Leahy Scale: Poor Standing balance comment: min assist provided in standing with multiple LOB.                    Cognition Arousal/Alertness: Awake/alert Behavior During Therapy: Impulsive Overall Cognitive Status: Impaired/Different from baseline Area of Impairment: Attention;Safety/judgement;Problem solving   Current Attention Level: Sustained Memory: Decreased short-term memory     Awareness: Emergent   General Comments: Pt found to be incontinent of urine. Unable to identify he was wet. Patient also could not remember that his clean underwear was in his hand to get changed.     Exercises      General Comments General comments (skin integrity, edema, etc.): functional tasks performed during session including self care, dynamic standing activities with assist for stability       Pertinent Vitals/Pain Pain Assessment: Faces Faces Pain Scale: Hurts even more Pain Location: one instance of head hurting Pain Descriptors / Indicators: Sharp Pain Intervention(s): Monitored during session    Home Living                      Prior Function            PT  Goals (current goals can now be found in the care plan section) Acute Rehab PT Goals Patient Stated Goal: Increase strength PT Goal Formulation: With patient Time For Goal Achievement:  02/16/16 Potential to Achieve Goals: Good Progress towards PT goals: Progressing toward goals    Frequency  Min 3X/week    PT Plan Current plan remains appropriate    Co-evaluation PT/OT/SLP Co-Evaluation/Treatment: Yes Reason for Co-Treatment: For patient/therapist safety;Other (comment) (pts tolerance to activity) PT goals addressed during session: Balance OT goals addressed during session: ADL's and self-care;Other (comment) (functional mobility)     End of Session Equipment Utilized During Treatment: Gait belt;Oxygen Activity Tolerance: Patient tolerated treatment well Patient left: in chair;with call bell/phone within reach;with chair alarm set     Time: 1332-1410 PT Time Calculation (min) (ACUTE ONLY): 38 min  Charges:  $Gait Training: 8-22 mins $Therapeutic Activity: 8-22 mins                    G CodesDuncan Hawkins 02-12-2016, 4:47 PM Alben Deeds, [place] DPT  [phone]

## 2016-02-10 NOTE — Progress Notes (Signed)
Transcranial Doppler  Date POD PCO2 HCT BP  MCA ACA PCA OPHT SIPH VERT Basilar  02/01/16 MS RS   35.5 138/57 Right  Left   53  55   -66  -41   39  40   20  18   -45  48   -45  -37   -24      02/03/16 JE    116/66 Right  Left   77  62   -70  -48   33  36   25  29   -50  -48   -39  -32   *      02/06/16 vs     Right  Left   70  64   -64  -46   34  39   18  15   26  25    -44  -36     -26    02/08/16 vs     Right  Left   45 55     -30  -31   28  46   22  14   19  26    -34  -37     -42    02/10/16 MS    143/64 Right  Left   45  54   -87  -22   -22  -17   13  17    *  43   -32  -37   -32           Right  Left                                            Right  Left                                        MCA = Middle Cerebral Artery      OPHT = Opthalmic Artery     BASILAR = Basilar Artery   ACA = Anterior Cerebral Artery     SIPH = Carotid Siphon PCA = Posterior Cerebral Artery   VERT = Verterbral Artery                   Normal MCA = 62+\-12 ACA = 50+\-12 PCA = 42+\-23   * Unable to insonate basilar 6/30 JE 02/08/16 Technically difficult to image  *Unable to insonate 02/10/16   02/10/2016 9:04 AM Arwen Haseley, RVT, RDCS, RDMS

## 2016-02-11 LAB — CULTURE, BLOOD (ROUTINE X 2)
CULTURE: NO GROWTH
CULTURE: NO GROWTH

## 2016-02-11 MED ORDER — HYDROXYZINE HCL 50 MG/ML IM SOLN
25.0000 mg | Freq: Once | INTRAMUSCULAR | Status: AC
  Administered 2016-02-11: 25 mg via INTRAMUSCULAR
  Filled 2016-02-11: qty 0.5

## 2016-02-11 MED ORDER — LABETALOL HCL 5 MG/ML IV SOLN: 5.0000 mg | INTRAVENOUS | Status: DC | PRN

## 2016-02-11 NOTE — Progress Notes (Signed)
PT is now recommending IP Rehab.  Upon chart review, pt. continues to require +2 min assist for ambulation and has ongoing balance deficits.  At this time, we are recommending IP Rehab consult.  Please order if you are agreeable.    [facility] Admissions Coordinator Cell [phone] Office [phone]

## 2016-02-11 NOTE — Progress Notes (Signed)
Patient ID: Jason Hawkins, male   DOB: 06/22/1928, 82 y.o.   MRN: [id number] Subjective: Patient reports mild headache and some nausea and "feeling crappy." He seems to have these episodes daily. No vomiting.  Objective: Vital signs in last 24 hours: Temp:  [97.5 F (36.4 C)-98.4 F (36.9 C)] 97.5 F (36.4 C) (07/08 0400) Pulse Rate:  [61-91] 71 (07/08 0700) Resp:  [14-28] 20 (07/08 0700) BP: (129-175)/(50-127) 175/70 mmHg (07/08 0700) SpO2:  [93 %-99 %] 96 % (07/08 0700)  Intake/Output from previous day: 07/07 0701 - 07/08 0700 In: 400 [P.O.:400] Out: 1000 [Urine:1000] Intake/Output this shift:    Neurologic: Grossly normal  Lab Results: Lab Results  Component Value Date   WBC 11.7* 02/09/2016   HGB 13.6 02/09/2016   HCT 39.8 02/09/2016   MCV 94.1 02/09/2016   PLT 295 02/09/2016   Lab Results  Component Value Date   INR 1.28 02/03/2016   PROTIME 18 01/06/2009   BMET Lab Results  Component Value Date   NA 137 02/05/2016   K 3.7 02/05/2016   CL 106 02/05/2016   CO2 23 02/05/2016   GLUCOSE 116* 02/05/2016   BUN 20 02/05/2016   CREATININE 0.98 02/05/2016   CALCIUM 8.5* 02/05/2016    Studies/Results: No results found.  Assessment/Plan: We will try some Vistaril to see if that helps. Otherwise continue current management. He seems stable.   LOS: 11 days    Ayauna Mcnay S 02/11/2016, 7:17 AM

## 2016-02-11 NOTE — Progress Notes (Signed)
Physical Therapy Treatment Patient Details Name: Jason Hawkins MRN: [id number] DOB: 02-14-1928 Today's Date: 02/11/2016    History of Present Illness 82 y.o. male with history of atrial fibrillation on coumadin, aortic valve replacement, hypertension, hypercholesterolemia and lower extremity edema presenting with sudden onset severe HA. Blood pressure elevated. CT showed [facility]    PT Comments    Pt very lethargic for session today. Ambulated 180' with RW, needing consistent min A (+2 for equipment and safety) to prevent posterior LOB. PT will continue to follow.   Follow Up Recommendations  CIR     Equipment Recommendations  None recommended by PT    Recommendations for Other Services       Precautions / Restrictions Precautions Precautions: Fall Restrictions Weight Bearing Restrictions: No    Mobility  Bed Mobility               General bed mobility comments: pt received in and returned to chair  Transfers Overall transfer level: Needs assistance Equipment used: Rolling walker (2 wheeled) Transfers: Sit to/from Stand Sit to Stand: Mod assist;+2 safety/equipment         General transfer comment: mod A due to strong posterior lean initially, +2 for equipment and safety. Took pt nearly a minute to maintain wt forward to begin to ambulate  Ambulation/Gait Ambulation/Gait assistance: Min assist;+2 safety/equipment Ambulation Distance (Feet): 180 Feet Assistive device: Rolling walker (2 wheeled) Gait Pattern/deviations: Step-through pattern;Trunk flexed;Narrow base of support;Shuffle Gait velocity: decreased Gait velocity interpretation: Below normal speed for age/gender General Gait Details: vc's for erect posture and worked on increasing step length. Pt starting to fall asleep even with walking and had to be continually aroused with conversation. Worked on high knee stepping to challenge balance and pt with multiple LOB posterior with this and could not correct to  succeed at this pattern. Distance limited by lethargy   Stairs            Wheelchair Mobility    Modified Rankin (Stroke Patients Only) Modified Rankin (Stroke Patients Only) Pre-Morbid Rankin Score: No symptoms Modified Rankin: Moderately severe disability     Balance Overall balance assessment: Needs assistance Sitting-balance support: Feet supported Sitting balance-Leahy Scale: Fair Sitting balance - Comments: difficulty wt shifting fwd Postural control: Posterior lean Standing balance support: Bilateral upper extremity supported Standing balance-Leahy Scale: Poor Standing balance comment: UE support needed as well as min A                    Cognition Arousal/Alertness: Lethargic;Suspect due to medications Behavior During Therapy: Impulsive Overall Cognitive Status: Impaired/Different from baseline Area of Impairment: Attention;Safety/judgement;Problem solving   Current Attention Level: Sustained Memory: Decreased short-term memory   Safety/Judgement: Decreased awareness of safety Awareness: Emergent Problem Solving: Requires verbal cues;Slow processing General Comments: pt lethargic even while mobilizing    Exercises      General Comments General comments (skin integrity, edema, etc.): VSS throughout ambulation on RA      Pertinent Vitals/Pain Pain Assessment: No/denies pain  HR 89 bpm  O2 sats 99% on RA    Home Living                      Prior Function            PT Goals (current goals can now be found in the care plan section) Acute Rehab PT Goals Patient Stated Goal: go home PT Goal Formulation: With patient Time For Goal Achievement: 02/16/16 Potential to Achieve Goals:  Good Progress towards PT goals: Progressing toward goals    Frequency  Min 3X/week    PT Plan Discharge plan needs to be updated    Co-evaluation             End of Session Equipment Utilized During Treatment: Gait belt Activity Tolerance:  Patient limited by lethargy Patient left: in chair;with call bell/phone within reach;with nursing/sitter in room     Time: [id number] PT Time Calculation (min) (ACUTE ONLY): 20 min  Charges:  $Gait Training: 8-22 mins                    G Codes:     Leighton Roach, PT  Acute Rehab Services  [phone]  Leighton Roach 02/11/2016, 1:27 PM

## 2016-02-12 NOTE — Progress Notes (Signed)
Patient ID: Jason Hawkins, male   DOB: June 12, 1928, 82 y.o.   MRN: [id number] Subjective: Patient reports he feels better this morning. Headache controlled.  Objective: Vital signs in last 24 hours: Temp:  [97.4 F (36.3 C)-99.4 F (37.4 C)] 97.8 F (36.6 C) (07/09 0727) Pulse Rate:  [47-89] 81 (07/09 0705) Resp:  [15-25] 21 (07/09 0705) BP: (105-194)/(50-126) 128/55 mmHg (07/09 0705) SpO2:  [93 %-99 %] 96 % (07/09 0705)  Intake/Output from previous day: 07/08 0701 - 07/09 0700 In: 183 [P.O.:180; I.V.:3] Out: 550 [Urine:550] Intake/Output this shift:    Neurologic: Grossly normal  Lab Results: Lab Results  Component Value Date   WBC 11.7* 02/09/2016   HGB 13.6 02/09/2016   HCT 39.8 02/09/2016   MCV 94.1 02/09/2016   PLT 295 02/09/2016   Lab Results  Component Value Date   INR 1.28 02/03/2016   PROTIME 18 01/06/2009   BMET Lab Results  Component Value Date   NA 137 02/05/2016   K 3.7 02/05/2016   CL 106 02/05/2016   CO2 23 02/05/2016   GLUCOSE 116* 02/05/2016   BUN 20 02/05/2016   CREATININE 0.98 02/05/2016   CALCIUM 8.5* 02/05/2016    Studies/Results: No results found.  Assessment/Plan: Doing better. Transfer to floor today.   LOS: 12 days    Jeanny Rymer S 02/12/2016, 8:26 AM

## 2016-02-13 ENCOUNTER — Other Ambulatory Visit (HOSPITAL_COMMUNITY): Payer: Medicare Other

## 2016-02-13 ENCOUNTER — Inpatient Hospital Stay (HOSPITAL_COMMUNITY)
Admission: RE | Admit: 2016-02-13 | Discharge: 2016-02-16 | DRG: 092 | Disposition: A | Discharge status: Other Acute Inpatient Hospital | Payer: Medicare Other | Source: Intra-hospital | Attending: Physical Medicine & Rehabilitation | Admitting: Physical Medicine & Rehabilitation

## 2016-02-13 DIAGNOSIS — I498 Other specified cardiac arrhythmias: Secondary | ICD-10-CM

## 2016-02-13 DIAGNOSIS — Z885 Allergy status to narcotic agent status: Secondary | ICD-10-CM

## 2016-02-13 DIAGNOSIS — Z8249 Family history of ischemic heart disease and other diseases of the circulatory system: Secondary | ICD-10-CM | POA: Diagnosis not present

## 2016-02-13 DIAGNOSIS — I10 Essential (primary) hypertension: Secondary | ICD-10-CM | POA: Diagnosis present

## 2016-02-13 DIAGNOSIS — R269 Unspecified abnormalities of gait and mobility: Secondary | ICD-10-CM | POA: Diagnosis not present

## 2016-02-13 DIAGNOSIS — Z833 Family history of diabetes mellitus: Secondary | ICD-10-CM

## 2016-02-13 DIAGNOSIS — Z79899 Other long term (current) drug therapy: Secondary | ICD-10-CM

## 2016-02-13 DIAGNOSIS — D72829 Elevated white blood cell count, unspecified: Secondary | ICD-10-CM | POA: Diagnosis present

## 2016-02-13 DIAGNOSIS — R739 Hyperglycemia, unspecified: Secondary | ICD-10-CM | POA: Diagnosis present

## 2016-02-13 DIAGNOSIS — E7800 Pure hypercholesterolemia, unspecified: Secondary | ICD-10-CM | POA: Diagnosis present

## 2016-02-13 DIAGNOSIS — R4189 Other symptoms and signs involving cognitive functions and awareness: Secondary | ICD-10-CM | POA: Diagnosis present

## 2016-02-13 DIAGNOSIS — I482 Chronic atrial fibrillation: Secondary | ICD-10-CM | POA: Diagnosis present

## 2016-02-13 DIAGNOSIS — I959 Hypotension, unspecified: Secondary | ICD-10-CM

## 2016-02-13 DIAGNOSIS — Z7901 Long term (current) use of anticoagulants: Secondary | ICD-10-CM | POA: Diagnosis not present

## 2016-02-13 DIAGNOSIS — Z952 Presence of prosthetic heart valve: Secondary | ICD-10-CM | POA: Diagnosis not present

## 2016-02-13 DIAGNOSIS — I609 Nontraumatic subarachnoid hemorrhage, unspecified: Secondary | ICD-10-CM

## 2016-02-13 DIAGNOSIS — Z87891 Personal history of nicotine dependence: Secondary | ICD-10-CM

## 2016-02-13 DIAGNOSIS — R001 Bradycardia, unspecified: Secondary | ICD-10-CM | POA: Diagnosis not present

## 2016-02-13 DIAGNOSIS — E871 Hypo-osmolality and hyponatremia: Secondary | ICD-10-CM | POA: Diagnosis present

## 2016-02-13 DIAGNOSIS — R51 Headache: Secondary | ICD-10-CM | POA: Diagnosis present

## 2016-02-13 DIAGNOSIS — R2689 Other abnormalities of gait and mobility: Principal | ICD-10-CM | POA: Diagnosis present

## 2016-02-13 DIAGNOSIS — I69398 Other sequelae of cerebral infarction: Secondary | ICD-10-CM

## 2016-02-13 DIAGNOSIS — Z91030 Bee allergy status: Secondary | ICD-10-CM

## 2016-02-13 DIAGNOSIS — I69319 Unspecified symptoms and signs involving cognitive functions following cerebral infarction: Secondary | ICD-10-CM

## 2016-02-13 DIAGNOSIS — G441 Vascular headache, not elsewhere classified: Secondary | ICD-10-CM

## 2016-02-13 MED ORDER — ACETAMINOPHEN 325 MG PO TABS
325.0000 mg | ORAL_TABLET | ORAL | Status: DC | PRN
  Administered 2016-02-13: 650 mg via ORAL
  Filled 2016-02-13: qty 2

## 2016-02-13 MED ORDER — ADULT MULTIVITAMIN W/MINERALS CH
1.0000 | ORAL_TABLET | Freq: Every day | ORAL | Status: DC
  Administered 2016-02-13 – 2016-02-16 (×4): 1 via ORAL
  Filled 2016-02-13 (×4): qty 1

## 2016-02-13 MED ORDER — DIPHENHYDRAMINE HCL 12.5 MG/5ML PO ELIX: 12.5000 mg | ORAL_SOLUTION | Freq: Four times a day (QID) | ORAL | Status: DC | PRN

## 2016-02-13 MED ORDER — DOCUSATE SODIUM 100 MG PO CAPS
100.0000 mg | ORAL_CAPSULE | Freq: Two times a day (BID) | ORAL | Status: DC
  Administered 2016-02-13 – 2016-02-16 (×5): 100 mg via ORAL
  Filled 2016-02-13 (×6): qty 1

## 2016-02-13 MED ORDER — ONDANSETRON HCL 4 MG PO TABS
4.0000 mg | ORAL_TABLET | Freq: Four times a day (QID) | ORAL | Status: DC | PRN
  Administered 2016-02-14 – 2016-02-15 (×2): 4 mg via ORAL
  Filled 2016-02-13 (×2): qty 1

## 2016-02-13 MED ORDER — TRAMADOL HCL 50 MG PO TABS: 50.0000 mg | ORAL_TABLET | Freq: Three times a day (TID) | ORAL | Status: DC

## 2016-02-13 MED ORDER — DM-GUAIFENESIN ER 30-600 MG PO TB12
1.0000 | ORAL_TABLET | Freq: Two times a day (BID) | ORAL | Status: DC
  Administered 2016-02-13 – 2016-02-15 (×4): 1 via ORAL
  Filled 2016-02-13 (×6): qty 1

## 2016-02-13 MED ORDER — ONDANSETRON HCL 4 MG/2ML IJ SOLN: 4.0000 mg | Freq: Four times a day (QID) | INTRAMUSCULAR | Status: DC | PRN

## 2016-02-13 MED ORDER — FLEET ENEMA 7-19 GM/118ML RE ENEM: 1.0000 | ENEMA | Freq: Once | RECTAL | Status: DC | PRN

## 2016-02-13 MED ORDER — MULTIVITAMINS PO CAPS: 1.0000 | ORAL_CAPSULE | Freq: Every day | ORAL | Status: DC

## 2016-02-13 MED ORDER — SENNOSIDES-DOCUSATE SODIUM 8.6-50 MG PO TABS: 1.0000 | ORAL_TABLET | Freq: Every evening | ORAL | Status: DC | PRN

## 2016-02-13 MED ORDER — ALUM & MAG HYDROXIDE-SIMETH 200-200-20 MG/5ML PO SUSP: 30.0000 mL | ORAL | Status: DC | PRN

## 2016-02-13 MED ORDER — AMLODIPINE BESYLATE 2.5 MG PO TABS
2.5000 mg | ORAL_TABLET | Freq: Every day | ORAL | Status: DC
  Administered 2016-02-14 – 2016-02-16 (×3): 2.5 mg via ORAL
  Filled 2016-02-13 (×3): qty 1

## 2016-02-13 MED ORDER — GUAIFENESIN-DM 100-10 MG/5ML PO SYRP: 5.0000 mL | ORAL_SOLUTION | Freq: Four times a day (QID) | ORAL | Status: DC | PRN

## 2016-02-13 MED ORDER — TRAZODONE HCL 50 MG PO TABS: 25.0000 mg | ORAL_TABLET | Freq: Every evening | ORAL | Status: DC | PRN

## 2016-02-13 MED ORDER — NIMODIPINE 30 MG PO CAPS
60.0000 mg | ORAL_CAPSULE | ORAL | Status: DC
  Administered 2016-02-13 – 2016-02-16 (×17): 60 mg via ORAL
  Filled 2016-02-13 (×17): qty 2

## 2016-02-13 MED ORDER — ASPIRIN EC 325 MG PO TBEC
325.0000 mg | DELAYED_RELEASE_TABLET | Freq: Every day | ORAL | Status: DC
  Administered 2016-02-14 – 2016-02-16 (×3): 325 mg via ORAL
  Filled 2016-02-13 (×3): qty 1

## 2016-02-13 MED ORDER — TOPIRAMATE 25 MG PO TABS
25.0000 mg | ORAL_TABLET | Freq: Every day | ORAL | Status: DC
  Administered 2016-02-13 – 2016-02-14 (×2): 25 mg via ORAL
  Filled 2016-02-13 (×2): qty 1

## 2016-02-13 MED ORDER — TRAMADOL HCL 50 MG PO TABS
50.0000 mg | ORAL_TABLET | Freq: Four times a day (QID) | ORAL | Status: DC | PRN
  Administered 2016-02-14 – 2016-02-15 (×3): 100 mg via ORAL
  Filled 2016-02-13 (×3): qty 2

## 2016-02-13 MED ORDER — BISACODYL 10 MG RE SUPP: 10.0000 mg | Freq: Every day | RECTAL | Status: DC | PRN

## 2016-02-13 MED ORDER — APIXABAN 2.5 MG PO TABS
2.5000 mg | ORAL_TABLET | Freq: Two times a day (BID) | ORAL | Status: DC
Start: 2016-02-13 — End: 2016-02-13

## 2016-02-13 MED ORDER — METHOCARBAMOL 500 MG PO TABS
500.0000 mg | ORAL_TABLET | Freq: Four times a day (QID) | ORAL | Status: DC | PRN
Start: 2016-02-13 — End: 2016-02-15

## 2016-02-13 MED ORDER — APIXABAN 2.5 MG PO TABS
2.5000 mg | ORAL_TABLET | Freq: Two times a day (BID) | ORAL | Status: DC
  Administered 2016-02-13 – 2016-02-16 (×6): 2.5 mg via ORAL
  Filled 2016-02-13 (×6): qty 1

## 2016-02-13 NOTE — PMR Pre-admission (Signed)
PMR Admission Coordinator Pre-Admission Assessment  Patient: Jason Hawkins is an 82 y.o., male MRN: [id number] DOB: 1927/12/07 Height: 5\' 9"  (175.3 cm) Weight: 84.369 kg (186 lb)              Insurance Information HMO:      PPO:       PCP:       IPA:       80/20:       OTHER:   PRIMARY:  Medicare A/B      Policy#:  [id number]      Subscriber: Barbette Reichmann CM Name:        Phone#:       Fax#:   Pre-Cert#:        Employer: Retired Benefits:  Phone #:       Name: Checked in palmetto Eff. Date: 12/05/94     Deduct: $1316      Out of Pocket Max: none      Life Max: unlimited CIR: 100%      SNF: 100 days Outpatient: 80%     Co-Pay: 20% Home Health: 100%      Co-Pay: none DME: 80%     Co-Pay: 20% Providers: patient's choice  SECONDARY: Generic commercial      Policy#: [id number]      Subscriber: Barbette Reichmann CM Name:        Phone#:       Fax#:   Pre-Cert#:        Employer: Retired - Leisure centre manager Benefits:  Phone #: [phone]     Name:   Eff. Date:       Deduct:        Out of Pocket Max:        Life Max:   CIR:        SNF:   Outpatient:       Co-Pay:   Home Health:        Co-Pay:   DME:       Co-Pay:    Emergency Contact Information Contact Information    Name Relation Home Work [place] Son   [phone]   Hodgins,Leesa Daughter   [phone]     Current Medical History  Patient Admitting Diagnosis: SAH and ACA aneurysm s/p coiling   History of Present Illness: An 82 y.o. 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 He was found to have [place] and cerebral angiogram revealed 1.5 X 3 mm ACA aneurysm. INR 1.8 and coumadin reversed. BP elevated therefore he was placed on Cardene drip for BP control. He underwent coil embolization of ACA aneurysm on 02/01/16 by Dr. Kathyrn Sheriff. Post op CP due to fluid overload and improved with IV diuresis. Headaches improved and therapy ongoing. Patient to  start on blood thinner Eliquis 2.5 mg po BID.  CIR recommended due to unsteady gait and LOB with activity.   Lives alone--was independent without AD and drive/maintains home. Sedentary.    Total: 1=NIH  Past Medical History  Past Medical History  Diagnosis Date  . Chronic atrial fibrillation ([place])   . Severe aortic stenosis     s/p aortic valve replacement using a pericardial tissue valve  . HTN (hypertension)   . Lower extremity edema   . Hypercholesteremia     Family History  family history includes Diabetes in his mother; Heart attack in his father; Heart disease in his father.  Prior Rehab/Hospitalizations: Had valve surgery  about 12 years ago and had leg fractures many years ago.  Has the patient had major surgery during 100 days prior to admission? No  Current Medications   Current facility-administered medications:  .  acetaminophen (TYLENOL) tablet 650 mg, 650 mg, Oral, Q4H PRN, 650 mg at 02/10/16 1725 **OR** acetaminophen (TYLENOL) solution 650 mg, 650 mg, Oral, Q4H PRN, Erline Levine, MD .  amLODipine (NORVASC) tablet 2.5 mg, 2.5 mg, Oral, Daily, Donzetta Starch, NP, 2.5 mg at 02/13/16 1024 .  apixaban (ELIQUIS) tablet 2.5 mg, 2.5 mg, Oral, Q12H, Consuella Lose, MD .  aspirin tablet 325 mg, 325 mg, Oral, Daily, Consuella Lose, MD, 325 mg at 02/13/16 1228 .  dextromethorphan-guaiFENesin (MUCINEX DM) 30-600 MG per 12 hr tablet 1 tablet, 1 tablet, Oral, BID, Brand Males, MD, 1 tablet at 02/13/16 1024 .  docusate sodium (COLACE) capsule 100 mg, 100 mg, Oral, BID, Erline Levine, MD, 100 mg at 02/13/16 1228 .  ketorolac (TORADOL) tablet 10 mg, 10 mg, Oral, Q6H PRN, Consuella Lose, MD, 10 mg at 02/13/16 1228 .  labetalol (NORMODYNE,TRANDATE) injection 5-10 mg, 5-10 mg, Intravenous, Q1H PRN, Eustace Moore, MD .  morphine 2 MG/ML injection 1-4 mg, 1-4 mg, Intravenous, Q1H PRN, Erline Levine, MD, 2 mg at 02/12/16 2148 .  niMODipine (NIMOTOP) capsule 60 mg, 60 mg, Oral,  Q4H, 60 mg at 02/13/16 1024 **OR** NiMODipine (NYMALIZE) 60 MG/20ML oral solution 60 mg, 60 mg, Per Tube, Q4H, Erline Levine, MD .  ondansetron (ZOFRAN-ODT) disintegrating tablet 4 mg, 4 mg, Oral, Q6H PRN **OR** ondansetron (ZOFRAN) injection 4 mg, 4 mg, Intravenous, Q6H PRN, Erline Levine, MD, 4 mg at 02/11/16 0358 .  pantoprazole (PROTONIX) EC tablet 40 mg, 40 mg, Oral, Daily, 40 mg at 02/13/16 1228 **OR** pantoprazole sodium (PROTONIX) 40 mg/20 mL oral suspension 40 mg, 40 mg, Per Tube, Daily, Erline Levine, MD .  sodium chloride flush (NS) 0.9 % injection 3 mL, 3 mL, Intravenous, Q12H, Brand Males, MD, 3 mL at 02/13/16 1026 .  sodium chloride flush (NS) 0.9 % injection 3 mL, 3 mL, Intravenous, PRN, Brand Males, MD .  traMADol Veatrice Bourbon) tablet 50-100 mg, 50-100 mg, Oral, Q6H PRN, Erline Levine, MD, 100 mg at 02/13/16 1024  Patients Current Diet: Diet Heart Room service appropriate?: Yes; Fluid consistency:: Thin  Precautions / Restrictions Precautions Precautions: Fall Restrictions Weight Bearing Restrictions: No   Has the patient had 2 or more falls or a fall with injury in the past year?No  Prior Activity Level Limited Community (1-2x/wk): Goes out about 2 X a week  Development worker, international aid / Equipment Home Assistive Devices/Equipment: Wellsite geologist, Hearing aid Home Equipment: Grab bars - tub/shower, Shower seat - built in  Prior Device Use: Indicate devices/aids used by the patient prior to current illness, exacerbation or injury? None  Prior Functional Level Prior Function Level of Independence: Independent  Self Care: Did the patient need help bathing, dressing, using the toilet or eating?  Independent  Indoor Mobility: Did the patient need assistance with walking from room to room (with or without device)? Independent  Stairs: Did the patient need assistance with internal or external stairs (with or without device)? Independent  Functional Cognition: Did the patient  need help planning regular tasks such as shopping or remembering to take medications? Independent  Current Functional Level Cognition  Overall Cognitive Status: Impaired/Different from baseline Current Attention Level: Sustained Orientation Level: Oriented X4 Safety/Judgement: Decreased awareness of safety, Decreased awareness of deficits General Comments: Pt with a notebook  present in the room writing down all events to help with recall. pt requesting to complete task and needed cues to initiate.     Extremity Assessment (includes Sensation/Coordination)  Upper Extremity Assessment: Overall WFL for tasks assessed (decreased fine motor coordination with functional tasks)  Lower Extremity Assessment: Defer to PT evaluation    ADLs  Overall ADL's : Needs assistance/impaired Eating/Feeding: Set up, Sitting Grooming: Wash/dry face, Set up, Standing (shaving) Grooming Details (indicate cue type and reason): Min assist for balance in standing Upper Body Bathing: Set up, Supervision/ safety, Sitting Lower Body Bathing: Total assistance, Sit to/from stand Lower Body Bathing Details (indicate cue type and reason): Pt able to clean bottom and upper legs sitting on commode with min guard assist for safety. Upper Body Dressing : Min guard, Set up, Sitting Upper Body Dressing Details (indicate cue type and reason): To doff/don hospital gown. Lower Body Dressing: Minimal assistance, Sit to/from stand, Cueing for safety Lower Body Dressing Details (indicate cue type and reason): Assist for balance in standing. Pt with LOB x3 when attempting to pull up underwear in bathroom. Toilet Transfer: Moderate assistance, Ambulation, Regular Toilet, Grab bars Toilet Transfer Details (indicate cue type and reason): Min assist for balance. Toileting- Clothing Manipulation and Hygiene: Moderate assistance, Sitting/lateral lean Toileting - Clothing Manipulation Details (indicate cue type and reason): able to  complete peri hygiene with lateral lean Functional mobility during ADLs: Rolling walker, Moderate assistance General ADL Comments: pt needed cues to stay inside RW, to bring R LE first into the walker and awareness of R side weakness. pt with static standing with R lean and knee flexion. Pt with tapping at quad to activate R LE.     Mobility  Overal bed mobility: Modified Independent Bed Mobility: Supine to Sit Supine to sit: Supervision, HOB elevated Sit to supine: Supervision, HOB elevated General bed mobility comments: pt received in and returned to chair    Transfers  Overall transfer level: Needs assistance Equipment used: Rolling walker (2 wheeled) Transfers: Sit to/from Stand Sit to Stand: Mod assist General transfer comment: cues for hand placement and safety    Ambulation / Gait / Stairs / Wheelchair Mobility  Ambulation/Gait Ambulation/Gait assistance: Min assist, +2 safety/equipment Ambulation Distance (Feet): 180 Feet Assistive device: Rolling walker (2 wheeled) Gait Pattern/deviations: Step-through pattern, Trunk flexed, Narrow base of support, Shuffle General Gait Details: vc's for erect posture and worked on increasing step length. Pt starting to fall asleep even with walking and had to be continually aroused with conversation. Worked on high knee stepping to challenge balance and pt with multiple LOB posterior with this and could not correct to succeed at this pattern. Distance limited by lethargy Gait velocity: decreased Gait velocity interpretation: Below normal speed for age/gender    Posture / Balance Dynamic Sitting Balance Sitting balance - Comments: difficulty wt shifting fwd Balance Overall balance assessment: Needs assistance Sitting-balance support: Feet supported Sitting balance-Leahy Scale: Fair Sitting balance - Comments: difficulty wt shifting fwd Postural control: Posterior lean Standing balance support: Single extremity supported, During functional  activity Standing balance-Leahy Scale: Fair Standing balance comment: UE support needed as well as min A    Special needs/care consideration BiPAP/CPAP No CPM No Continuous Drip IV No  Dialysis No         Life Vest No Oxygen No Special Bed No Trach Size No Wound Vac (area) No      Skin NO  Bowel mgmt: Last BM 02/10/16 Bladder mgmt: Voiding in urinal Diabetic mgmt No    Previous Home Environment Living Arrangements: Alone Available Help at Discharge: Family, Available 24 hours/day Type of Home: House Home Layout: One level Home Access: Stairs to enter Entrance Stairs-Rails: Can reach both Entrance Stairs-Number of Steps: 3 Bathroom Shower/Tub: Multimedia programmer: Handicapped height Home Care Services: No  Discharge Living Setting Plans for Discharge Living Setting: Patient's home, Alone, House (Lives alone, family to stay with him.) One son lives close by on adjacent property. Type of Home at Discharge: House Discharge Home Layout: One level Discharge Home Access: Stairs to enter Entrance Stairs-Number of Steps: 3-5 steps entry Does the patient have any problems obtaining your medications?: No  Social/Family/Support Systems Patient Roles: Parent, Other (Comment) (Has a daughter, 2 sons, 2 grandchildren, brothers.) Contact Information: Jireh Lubinski - son - [phone] Anticipated Caregiver: Daughter and sons Anticipated Caregiver's Contact Information: Angie Fava - daughter - [phone] Ability/Limitations of Caregiver: Family will make sure that someone stays with patient after discharge Caregiver Availability: Other (Comment) (Family to take turns staying with patient.) Discharge Plan Discussed with Primary Caregiver: Yes Is Caregiver In Agreement with Plan?: Yes Does Caregiver/Family have Issues with Lodging/Transportation while Pt is in Rehab?: No  Goals/Additional Needs Patient/Family Goal for Rehab: PT/OT mod I and  supervision goals Expected length of stay: 13-17 days Cultural Considerations: Lutheran Dietary Needs: Heart diet with thin liquids Equipment Needs: TBD Pt/Family Agrees to Admission and willing to participate: Yes Program Orientation Provided & Reviewed with Pt/Caregiver Including Roles  & Responsibilities: Yes  Decrease burden of Care through IP rehab admission: N/A  Possible need for SNF placement upon discharge: Not planned  Patient Condition: This patient's condition remains as documented in the consult dated 02/13/16, in which the Rehabilitation Physician determined and documented that the patient's condition is appropriate for intensive rehabilitative care in an inpatient rehabilitation facility. I have clarified that family will provide caregiver support upon discharge from rehab.  Will admit to inpatient rehab today.  Preadmission Screen Completed By:  [name], 02/13/2016 2:38 PM ______________________________________________________________________   Discussed status with Dr. Posey Pronto on 02/13/16 at 1436 and received telephone approval for admission today.  Admission Coordinator:  [name], time1436/Date07/10/17

## 2016-02-13 NOTE — Plan of Care (Signed)
Problem: RH BOWEL ELIMINATION Goal: RH STG MANAGE BOWEL WITH ASSISTANCE STG Manage Bowel with min Assistance.  Outcome: Not Progressing Per report usual pattern every 3-5 days

## 2016-02-13 NOTE — NC FL2 (Signed)
[place] LEVEL OF CARE SCREENING TOOL     IDENTIFICATION  Patient Name: Jason Hawkins Birthdate: [birth year] Sex: male Admission Date (Current Location): 01/31/2016  [facility] and [place] Number:  Herbalist and Address:  [facility], [place] [address], [place], [place] [address]      Provider Number: [id number]  Attending Physician Name and Address:  Erline Levine, MD  Relative Name and Phone Number:  [name], son, [phone]    Current Level of Care: Hospital Recommended Level of Care: [place] Prior Approval Number:    Date Approved/Denied:   PASRR Number: [id number]  Discharge Plan: SNF    Current Diagnoses: Patient Active Problem List   Diagnosis Date Noted  . Benign essential HTN   . Leukocytosis   . SOB (shortness of breath)   . Acute hypoxemic respiratory failure ([place]) 02/04/2016  . Ruptured cerebral aneurysm ([place]) 02/01/2016  . Subarachnoid bleed ([place]) 01/31/2016  . Chronic atrial fibrillation ([place])   . Hypertensive urgency   . SAH (subarachnoid hemorrhage) ([place])   . 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 Well adult exam 01/04/2016  . H/O prostate cancer 04/30/2014  . Fatigue 04/30/2014  . PVD (peripheral vascular disease) with claudication ([place]) 12/21/2013  . Dyslipidemia 12/21/2013  . Elevated BP 05/12/2013  . Bee sting allergy 03/17/2013  . Chronic venous insufficiency 03/17/2013  . Herpes zoster 03/17/2013  . Depression 04/13/2011  . Long term current use of anticoagulant therapy 12/14/2010  . Atrial fibrillation ([place]) 12/27/2009  . Edema 12/27/2009  . S/P AVR 12/27/2009    Orientation RESPIRATION BLADDER Height & Weight     Self, Time, Situation, Place  Normal Continent Weight: 84.369 kg (186 lb) Height:  5\' 9"  (175.3 cm)  BEHAVIORAL SYMPTOMS/MOOD NEUROLOGICAL BOWEL NUTRITION STATUS      Continent Diet (Please see DC Summary)  AMBULATORY  STATUS COMMUNICATION OF NEEDS Skin   Limited Assist Verbally Normal                       Personal Care Assistance Level of Assistance  Bathing, Feeding, Dressing Bathing Assistance: Limited assistance Feeding assistance: Independent Dressing Assistance: Limited assistance     Functional Limitations Info  Hearing   Hearing Info: Impaired      SPECIAL CARE FACTORS FREQUENCY  PT (By licensed PT), OT (By licensed OT)     PT Frequency: 5x/week OT Frequency: 2x/week            Contractures      Additional Factors Info  Code Status, Allergies Code Status Info: Full Allergies Info: Bee Venom, Codeine           Current Medications (02/13/2016):  This is the current hospital active medication list Current Facility-Administered Medications  Medication Dose Route Frequency Provider Last Rate Last Dose  . acetaminophen (TYLENOL) tablet 650 mg  650 mg Oral Q4H PRN Erline Levine, MD   650 mg at 02/10/16 1725   Or  . acetaminophen (TYLENOL) solution 650 mg  650 mg Oral Q4H PRN Erline Levine, MD      . amLODipine (NORVASC) tablet 2.5 mg  2.5 mg Oral Daily Donzetta Starch, NP   2.5 mg at 02/13/16 1024  . apixaban (ELIQUIS) tablet 2.5 mg  2.5 mg Oral Q12H Consuella Lose, MD      . aspirin tablet 325 mg  325 mg Oral Daily Consuella Lose, MD   325 mg at  02/13/16 1228  . dextromethorphan-guaiFENesin (MUCINEX DM) 30-600 MG per 12 hr tablet 1 tablet  1 tablet Oral BID Brand Males, MD   1 tablet at 02/13/16 1024  . docusate sodium (COLACE) capsule 100 mg  100 mg Oral BID Erline Levine, MD   100 mg at 02/13/16 1228  . ketorolac (TORADOL) tablet 10 mg  10 mg Oral Q6H PRN Consuella Lose, MD   10 mg at 02/13/16 1228  . labetalol (NORMODYNE,TRANDATE) injection 5-10 mg  5-10 mg Intravenous Q1H PRN Eustace Moore, MD      . morphine 2 MG/ML injection 1-4 mg  1-4 mg Intravenous Q1H PRN Erline Levine, MD   2 mg at 02/12/16 2148  . niMODipine (NIMOTOP) capsule 60 mg  60 mg Oral Q4H  Erline Levine, MD   60 mg at 02/13/16 1024   Or  . NiMODipine (NYMALIZE) 60 MG/20ML oral solution 60 mg  60 mg Per Tube Q4H Erline Levine, MD      . ondansetron (ZOFRAN-ODT) disintegrating tablet 4 mg  4 mg Oral Q6H PRN Erline Levine, MD       Or  . ondansetron [facility]) injection 4 mg  4 mg Intravenous Q6H PRN Erline Levine, MD   4 mg at 02/11/16 0358  . pantoprazole (PROTONIX) EC tablet 40 mg  40 mg Oral Daily Erline Levine, MD   40 mg at 02/13/16 1228   Or  . pantoprazole sodium (PROTONIX) 40 mg/20 mL oral suspension 40 mg  40 mg Per Tube Daily Erline Levine, MD      . sodium chloride flush (NS) 0.9 % injection 3 mL  3 mL Intravenous Q12H Brand Males, MD   3 mL at 02/13/16 1026  . sodium chloride flush (NS) 0.9 % injection 3 mL  3 mL Intravenous PRN Brand Males, MD      . traMADol Veatrice Bourbon) tablet 50-100 mg  50-100 mg Oral Q6H PRN Erline Levine, MD   100 mg at 02/13/16 1024     Discharge Medications: Please see discharge summary for a list of discharge medications.  Relevant Imaging Results:  Relevant Lab Results:   Additional Information SSN: [place], [place]

## 2016-02-13 NOTE — Progress Notes (Signed)
Patient ID: Jason Hawkins, male   DOB: 1927/08/22, 82 y.o.   MRN: [id number] [place] per Dr. Kathyrn Sheriff to begin Eliquis 2.5 mg po BID.  (I will touch base with his cardiologist re: future dose adjustments for hx A.Fib.)   Verdis Prime RN BSN

## 2016-02-13 NOTE — Progress Notes (Signed)
Pt admitted to unit at 1645 with daughter at bedside. Rn reviewed rehab booklet, process, and safety plan with verbal understanding. Call bell within reach, daughter at bedsidepr

## 2016-02-13 NOTE — Progress Notes (Signed)
Physical Therapy Note  Clinical Impression Statement: Pt with improved ambulation tolerance however con't to demo decreased R LE step height and length. Pt with decreased activity tolerance requiring seated rest breaks. Pt to con't to benefit from CIR upon d/c to maximize functional recovery.    02/13/16 1034  PT Visit Information  Last PT Received On 02/13/16  Assistance Needed +1  History of Present Illness 82 y.o. male with history of atrial fibrillation on coumadin, aortic valve replacement, hypertension, hypercholesterolemia and lower extremity edema presenting with sudden onset severe HA. Blood pressure elevated. CT showed SAH  Subjective Data  Subjective pt received sitting up in chair, very HOH  Patient Stated Goal go home  Precautions  Precautions Fall  Restrictions  Weight Bearing Restrictions No  Pain Assessment  Pain Assessment No/denies pain  Cognition  Arousal/Alertness Awake/alert  Behavior During Therapy WFL for tasks assessed/performed  Overall Cognitive Status Impaired/Different from baseline  Area of Impairment Attention;Memory;Safety/judgement  Current Attention Level Sustained  Memory Decreased short-term memory  Awareness Emergent  Problem Solving Requires verbal cues;Requires tactile cues;Slow processing  General Comments max directional v/c's for all tasks asked, may be due to extremely HOH  Bed Mobility  General bed mobility comments pt up in chair  Transfers  Overall transfer level Needs assistance  Equipment used Rolling walker (2 wheeled)  Transfers Sit to/from Stand  Sit to Stand Mod assist  General transfer comment v/c's for safe hand placement  Ambulation/Gait  Ambulation/Gait assistance Min assist;+2 safety/equipment (for chair follow)  Ambulation Distance (Feet) 150 Feet  Assistive device Rolling walker (2 wheeled)  Gait Pattern/deviations Step-through pattern  General Gait Details v/c's to pick up feet, decreased pace, guarded  Gait  velocity decrease  Modified Rankin (Stroke Patients Only)  Pre-Morbid Rankin Score 0  Modified Rankin 4  Balance  Overall balance assessment Needs assistance  Standing balance support Single extremity supported  Standing balance-Leahy Scale Poor  Standing balance comment UE support, unable to balance without UEs  General Comments  General comments (skin integrity, edema, etc.) VSS  Exercises  Exercises Other exercises  General Exercises - Lower Extremity  Mini-Sqauts Left;10 reps (with increased weight shift to the R)  Other Exercises  Other Exercises worked on single limb stance on both L and R, unable to achieve without UE assist. Pt able to maintain for 2 sec with unilateral UE support  PT - End of Session  Equipment Utilized During Treatment Gait belt  Activity Tolerance Patient limited by lethargy  Patient left in chair;with call bell/phone within reach;with nursing/sitter in room  Nurse Communication Mobility status  PT - Assessment/Plan  PT Plan Current plan remains appropriate  PT Frequency (ACUTE ONLY) Min 3X/week  Follow Up Recommendations CIR  PT equipment None recommended by PT  PT Goal Progression  Progress towards PT goals Progressing toward goals  PT Time Calculation  PT Start Time (ACUTE ONLY) 1034  PT Stop Time (ACUTE ONLY) 1059  PT Time Calculation (min) (ACUTE ONLY) 25 min  PT General Charges  $$ ACUTE PT VISIT 1 Procedure  PT Treatments  $Gait Training 8-22 mins  $Therapeutic Exercise 8-22 mins    Kittie Plater, PT, DPT Pager #: [phone] Office #: [phone]

## 2016-02-13 NOTE — Progress Notes (Signed)
Rehab admissions - I met with patient and his daughter at the bedside.  Daughter in agreement with inpatient rehab admission.  Dtr and sons plan to provide needed supervision after rehab stay.  I spoke with Verdis Prime and Dr. Kathyrn Sheriff and have clearance for acute inpatient rehab admission for today.  Bed available today and will admit to rehab today.  Call me for questions.  #[phone]

## 2016-02-13 NOTE — Progress Notes (Signed)
Patient is discharged from room 5C13 and transferred to unit [id number] at this time. Alert and in stable condition. IV site d/c'd. Report given to receiving nurse Pryor Montes, RN. Transported via bed with all belongings at side.

## 2016-02-13 NOTE — Progress Notes (Signed)
Subjective: Patient reports "I don't have a headache this morning"  Objective: Vital signs in last 24 hours: Temp:  [97.7 F (36.5 C)-99 F (37.2 C)] 98.4 F (36.9 C) (07/10 0616) Pulse Rate:  [50-78] 50 (07/10 0616) Resp:  [14-21] 14 (07/10 0616) BP: (117-160)/(49-92) 160/55 mmHg (07/10 0616) SpO2:  [94 %-100 %] 97 % (07/10 0616)  Intake/Output from previous day: 07/09 0701 - 07/10 0700 In: 723 [P.O.:720; I.V.:3] Out: 900 [Urine:900] Intake/Output this shift:    Alert, conversant. Reports no headache today. States he feels better overall. MAEW. Hopeful to walk some today.  Lab Results: No results for input(s): WBC, HGB, HCT, PLT in the last 72 hours. BMET No results for input(s): NA, K, CL, CO2, GLUCOSE, BUN, CREATININE, CALCIUM in the last 72 hours.  Studies/Results: No results found.  Assessment/Plan: Improving   LOS: 13 days  Mobilize today as he tolerates. Hopeful of CIR.    Verdis Prime 02/13/2016, 8:27 AM

## 2016-02-13 NOTE — Consult Note (Signed)
Physical Medicine and Rehabilitation Consult  Reason for Consult: Gait disorder Referring Physician: Dr. Ronnald Ramp   HPI: Jason Hawkins is a 82 y.o. 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 He was found to have [place] and cerebral angiogram revealed 1.5 X 3 mm ACA aneurysm  INR 1.8 and coumadin reversed. BP elevated therefore he was placed on Cardene drip for BP control. He underwent coil embolization of ACA aneurysm on 02/01/16 by Dr. Kathyrn Sheriff. Post op CP due to fluid overload and improved with IV diuresis. Headaches improved and therapy ongoing. CIR recommended due to unsteady gait and LOB with activity.   Lives alone--was independent without AD and drive/maintains home. Sedentary.    Review of Systems  HENT: Positive for hearing loss.   Eyes: Negative for blurred vision, double vision and redness.  Respiratory: Negative for cough and shortness of breath.   Cardiovascular: Negative for chest pain, palpitations and leg swelling.  Gastrointestinal: Negative for heartburn, nausea and constipation.  Genitourinary: Negative for dysuria and urgency.  Musculoskeletal: Negative for joint pain and falls.  Neurological: Positive for weakness and headaches (Sharp flashes). Negative for dizziness.  Psychiatric/Behavioral: Negative for memory loss.  All other systems reviewed and are negative.    Past Medical History  Diagnosis Date  . Chronic atrial fibrillation ([place])   . Severe aortic stenosis     s/p aortic valve replacement using a pericardial tissue valve  . HTN (hypertension)   . Lower extremity edema   . Hypercholesteremia     Past Surgical History  Procedure Laterality Date  . Aortic valve replacement  08/30/2003    Dr. Cyndia Bent  . Radiology with anesthesia N/A 02/01/2016    Procedure: RADIOLOGY WITH ANESTHESIA;  Surgeon: Consuella Lose, MD;  Location: [facility];  Service: Radiology;  Laterality: N/A;     Family History  Problem Relation Age of Onset  . Heart attack    . Diabetes    . Diabetes Mother   . Heart disease Father   . Heart attack Father     Social History:  Widowed--has son and daughter in law in town (work). Retired from [employer], [employer]. He  reports that he has quit smoking. He does not have any smokeless tobacco history on file. He reports that he drinks about 8.4 oz of alcohol per week. He reports that he does not use illicit drugs.    Allergies  Allergen Reactions  . Bee Venom Anaphylaxis  . Codeine     nausea    Medications Prior to Admission  Medication Sig Dispense Refill  . co-enzyme Q-10 50 MG capsule Take 50 mg by mouth daily.    . diphenhydrAMINE (BENADRYL) 25 MG tablet Take 2 tablets (50 mg total) by mouth every 6 (six) hours as needed for itching or allergies (bee sting). 30 tablet 0  . EPINEPHrine 0.3 mg/0.3 mL IJ SOAJ injection Inject 0.3 mg into the muscle once.    [name] GLUCOSAMINE-CHONDROITIN PO Take 1 tablet by mouth daily.      [name] Hyaluronic Acid 20-60 MG CAPS Take 1 tablet by mouth daily.      . L-ARGININE PO Take 1 capsule by mouth 4 (four) times a week.    . L-CITRULLINE PO Take 1 capsule by mouth 4 (four) times a week.    . Multiple Vitamin (MULTIVITAMIN) capsule Take 1 capsule by mouth daily.      [name] OVER THE COUNTER MEDICATION  Take 1 Dose by mouth 4 (four) times a week. Helps with Testerone.    . Red Yeast Rice 600 MG CAPS Take 1 capsule by mouth daily. Reported on 12/28/2015    . TURMERIC PO Take 1 tablet by mouth daily.      [name] warfarin (COUMADIN) 5 MG tablet Take 1 tablet (5 mg total) by mouth as directed. 30 tablet 11    Home: Home Living Family/patient expects to be discharged to:: Private residence Living Arrangements: Alone Available Help at Discharge: Family, Available 24 hours/day Type of Home: House Home Access: Stairs to enter Technical brewer of Steps: 3 Entrance Stairs-Rails: Can reach both Home Layout: One  level Bathroom Shower/Tub: Multimedia programmer: Handicapped height Melissa: Grab bars - tub/shower, [place] in  Functional History: Prior Function Level of Independence: Independent Functional Status:  Mobility: Bed Mobility Overal bed mobility: Needs Assistance Bed Mobility: Supine to Sit Supine to sit: Supervision, HOB elevated Sit to supine: Supervision, HOB elevated General bed mobility comments: pt received in and returned to chair Transfers Overall transfer level: Needs assistance Equipment used: Rolling walker (2 wheeled) Transfers: Sit to/from Stand Sit to Stand: Mod assist, +2 safety/equipment General transfer comment: mod A due to strong posterior lean initially, +2 for equipment and safety. Took pt nearly a minute to maintain wt forward to begin to ambulate Ambulation/Gait Ambulation/Gait assistance: Min assist, +2 safety/equipment Ambulation Distance (Feet): 180 Feet Assistive device: Rolling walker (2 wheeled) Gait Pattern/deviations: Step-through pattern, Trunk flexed, Narrow base of support, Shuffle General Gait Details: vc's for erect posture and worked on increasing step length. Pt starting to fall asleep even with walking and had to be continually aroused with conversation. Worked on high knee stepping to challenge balance and pt with multiple LOB posterior with this and could not correct to succeed at this pattern. Distance limited by lethargy Gait velocity: decreased Gait velocity interpretation: Below normal speed for age/gender    ADL: ADL Overall ADL's : Needs assistance/impaired Eating/Feeding: Set up, Sitting Grooming: Minimal assistance, Standing, Wash/dry hands Grooming Details (indicate cue type and reason): Min assist for balance in standing Upper Body Bathing: Set up, Supervision/ safety, Sitting Lower Body Bathing: Min guard, Sitting/lateral leans Lower Body Bathing Details (indicate cue type and reason): Pt able to  clean bottom and upper legs sitting on commode with min guard assist for safety. Upper Body Dressing : Min guard, Set up, Sitting Upper Body Dressing Details (indicate cue type and reason): To doff/don hospital gown. Lower Body Dressing: Minimal assistance, Sit to/from stand, Cueing for safety Lower Body Dressing Details (indicate cue type and reason): Assist for balance in standing. Pt with LOB x3 when attempting to pull up underwear in bathroom. Toilet Transfer: Minimal assistance, Ambulation, Regular Toilet, RW, Grab bars Toilet Transfer Details (indicate cue type and reason): Min assist for balance. Toileting- Water quality scientist and Hygiene: Min guard, Sitting/lateral lean Functional mobility during ADLs: Minimal assistance, Rolling walker General ADL Comments: Pt very unsteady at start of session and during ADL activity. Pt with posterior LOB x 2 and L lateral LOB x 1 during functional tasks in standing. Pt with urinary incontinence; unable to identify that he was wet in bed.  Cognition: Cognition Overall Cognitive Status: Impaired/Different from baseline Orientation Level: Oriented X4 Cognition Arousal/Alertness: Lethargic, Suspect due to medications Behavior During Therapy: Impulsive Overall Cognitive Status: Impaired/Different from baseline Area of Impairment: Attention, Safety/judgement, Problem solving Current Attention Level: Sustained Memory: Decreased short-term memory Safety/Judgement: Decreased awareness of  safety Awareness: Emergent Problem Solving: Requires verbal cues, Slow processing General Comments: pt lethargic even while mobilizing   Blood pressure 160/55, pulse 50, temperature 98.4 F (36.9 C), temperature source Oral, resp. rate 14, height 5\' 9"  (1.753 m), weight 84.369 kg (186 lb), SpO2 97 %. Physical Exam  Nursing note and vitals reviewed. Constitutional: He is oriented to person, place, and time. He appears well-developed and well-nourished. He appears  listless. No distress.  HENT:  Head: Normocephalic and atraumatic.  Eyes: Conjunctivae are normal. Pupils are equal, round, and reactive to light.  Neck: Neck supple. No tracheal deviation present.  Cardiovascular: An irregularly irregular rhythm present.  Respiratory: No stridor. No respiratory distress. He has decreased breath sounds in the right lower field and the left lower field. He has no wheezes.  GI: Soft. Bowel sounds are normal. He exhibits no distension. There is no tenderness.  Musculoskeletal: He exhibits no edema or tenderness.  Neurological: He is oriented to person, place, and time. He appears listless.  HOH.  Needed cues to keep eyes open, briefly opens them.  Flat affect with slow speech.  He is able to follow simple motor commands.  Sensation intact to light touch Motor: 4+/5 throughout, ?slight decrease in right hip flexion  Skin: Skin is warm and dry. He is not diaphoretic.  Psychiatric: His affect is blunt. He is slowed. Cognition and memory are normal.  Tired, keeps eyes closed for majority of exam    No results found for this or any previous visit (from the past 24 hour(s)). No results found.  Assessment/Plan: Diagnosis: SAH and ACA aneurysm s/p coiling Labs and images independently reviewed.  Records reviewed and summated above.  1. Does the need for close, 24 hr/day medical supervision in concert with the patient's rehab needs make it unreasonable for this patient to be served in a less intensive setting? Yes  2. Co-Morbidities requiring supervision/potential complications: S/P AVR, A fib (monitor HE with increased activity, cont meds), HTN (monitor and provide prns in accordance with increased physical exertion and pain), leukocytosis (cont to monitor for signs and symptoms of infection, further workup if indicated) 3. Due to safety, skin/wound care, medication administration, pain management and patient education, does the patient require 24 hr/day rehab  nursing? Yes 4. Does the patient require coordinated care of a physician, rehab nurse, PT (1-2 hrs/day, 5 days/week) and OT (1-2 hrs/day, 5 days/week) to address physical and functional deficits in the context of the above medical diagnosis(es)? Yes Addressing deficits in the following areas: balance, endurance, locomotion, strength, transferring, bathing, dressing, toileting and psychosocial support 5. Can the patient actively participate in an intensive therapy program of at least 3 hrs of therapy per day at least 5 days per week? Potentially 6. The potential for patient to make measurable gains while on inpatient rehab is excellent 7. Anticipated functional outcomes upon discharge from inpatient rehab are modified independent and supervision  with PT, independent and modified independent with OT, n/a with SLP. 8. Estimated rehab length of stay to reach the above functional goals is: 13-17 days. 9. Does the patient have adequate social supports and living environment to accommodate these discharge functional goals? Potentially 10. Anticipated D/C setting: Home 11. Anticipated post D/C treatments: HH therapy and Home excercise program 12. Overall Rehab/Functional Prognosis: good  RECOMMENDATIONS: This patient's condition is appropriate for continued rehabilitative care in the following setting: Likely CIR.  Will clarify caregiver support at discharge. Patient has agreed to participate in recommended program. Potentially Note  that insurance prior authorization may be required for reimbursement for recommended care.  Comment: Rehab Admissions Coordinator to follow up.  Delice Lesch, MD 02/13/2016

## 2016-02-13 NOTE — Progress Notes (Signed)
Patient being admitted to CIR.   CSW signing off.  Percell Locus Loura Pitt LCSWA [phone]

## 2016-02-13 NOTE — H&P (View-Only) (Signed)
Physical Medicine and Rehabilitation Admission H&P    Chief Complaint  Patient presents with  . Gait disorder     HPI:  Jason Hawkins is a 82 y.o. 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 He was found to have [place] and cerebral angiogram revealed 1.5 X 3 mm ACA aneurysm  INR 1.8 and coumadin reversed. BP elevated therefore he was placed on Cardene drip for BP control. He underwent coil embolization of ACA aneurysm on 02/01/16 by Dr. Kathyrn Sheriff.  Post op reported to have CP due to fluid overload and improved with IV diuresis. Headaches improved and therapy ongoing. CIR recommended due to unsteady gait and LOB with activity.    Review of Systems  Eyes: Positive for photophobia. Negative for double vision.  Respiratory: Negative for cough and shortness of breath.   Cardiovascular: Negative for chest pain and palpitations.  Gastrointestinal: Negative for heartburn, nausea and constipation.  Genitourinary: Negative for dysuria and urgency.  Musculoskeletal: Negative for myalgias, back pain and joint pain.  Skin: Negative for itching and rash.  Neurological: Positive for weakness and headaches. Negative for speech change.  Psychiatric/Behavioral: The patient is not nervous/anxious and does not have insomnia.   All other systems reviewed and are negative.     Past Medical History  Diagnosis Date  . Chronic atrial fibrillation ([place])   . Severe aortic stenosis     s/p aortic valve replacement using a pericardial tissue valve  . HTN (hypertension)   . Lower extremity edema   . Hypercholesteremia     Past Surgical History  Procedure Laterality Date  . Aortic valve replacement  08/30/2003    Dr. Cyndia Bent  . Radiology with anesthesia N/A 02/01/2016    Procedure: RADIOLOGY WITH ANESTHESIA;  Surgeon: Consuella Lose, MD;  Location: [facility];  Service: Radiology;  Laterality: N/A;    Family History  Problem Relation Age  of Onset  . Heart attack    . Diabetes    . Diabetes Mother   . Heart disease Father   . Heart attack Father     Social History:  Widowed--has son and daughter in law in town (work). Retired from [employer], [employer]. He reports that he has quit smoking. He does not have any smokeless tobacco history on file. He reports that he drinks about 8.4 oz of alcohol per week. He reports that he does not use illicit drugs   Allergies  Allergen Reactions  . Bee Venom Anaphylaxis  . Codeine     nausea   Medications Prior to Admission  Medication Sig Dispense Refill  . co-enzyme Q-10 50 MG capsule Take 50 mg by mouth daily.    . diphenhydrAMINE (BENADRYL) 25 MG tablet Take 2 tablets (50 mg total) by mouth every 6 (six) hours as needed for itching or allergies (bee sting). 30 tablet 0  . EPINEPHrine 0.3 mg/0.3 mL IJ SOAJ injection Inject 0.3 mg into the muscle once.    [name] GLUCOSAMINE-CHONDROITIN PO Take 1 tablet by mouth daily.      [name] Hyaluronic Acid 20-60 MG CAPS Take 1 tablet by mouth daily.      . L-ARGININE PO Take 1 capsule by mouth 4 (four) times a week.    . L-CITRULLINE PO Take 1 capsule by mouth 4 (four) times a week.    . Multiple Vitamin (MULTIVITAMIN) capsule Take 1 capsule by mouth daily.      [name] OVER THE COUNTER  MEDICATION Take 1 Dose by mouth 4 (four) times a week. Helps with Testerone.    . Red Yeast Rice 600 MG CAPS Take 1 capsule by mouth daily. Reported on 12/28/2015    . TURMERIC PO Take 1 tablet by mouth daily.      [name] warfarin (COUMADIN) 5 MG tablet Take 1 tablet (5 mg total) by mouth as directed. 30 tablet 11    Home: Home Living Family/patient expects to be discharged to:: Private residence Living Arrangements: Alone Available Help at Discharge: Family, Available 24 hours/day Type of Home: House Home Access: Stairs to enter Technical brewer of Steps: 3 Entrance Stairs-Rails: Can reach both Home Layout: One level Bathroom Shower/Tub: Tourist information centre manager: Handicapped height Richards: Grab bars - tub/shower, [place] in   Functional History: Prior Function Level of Independence: Independent  Functional Status:  Mobility: Bed Mobility Overal bed mobility: Modified Independent Bed Mobility: Supine to Sit Supine to sit: Supervision, HOB elevated Sit to supine: Supervision, HOB elevated General bed mobility comments: pt received in and returned to chair Transfers Overall transfer level: Needs assistance Equipment used: Rolling walker (2 wheeled) Transfers: Sit to/from Stand Sit to Stand: Mod assist General transfer comment: cues for hand placement and safety Ambulation/Gait Ambulation/Gait assistance: Min assist, +2 safety/equipment Ambulation Distance (Feet): 180 Feet Assistive device: Rolling walker (2 wheeled) Gait Pattern/deviations: Step-through pattern, Trunk flexed, Narrow base of support, Shuffle General Gait Details: vc's for erect posture and worked on increasing step length. Pt starting to fall asleep even with walking and had to be continually aroused with conversation. Worked on high knee stepping to challenge balance and pt with multiple LOB posterior with this and could not correct to succeed at this pattern. Distance limited by lethargy Gait velocity: decreased Gait velocity interpretation: Below normal speed for age/gender    ADL: ADL Overall ADL's : Needs assistance/impaired Eating/Feeding: Set up, Sitting Grooming: Wash/dry face, Set up, Standing (shaving) Grooming Details (indicate cue type and reason): Min assist for balance in standing Upper Body Bathing: Set up, Supervision/ safety, Sitting Lower Body Bathing: Total assistance, Sit to/from stand Lower Body Bathing Details (indicate cue type and reason): Pt able to clean bottom and upper legs sitting on commode with min guard assist for safety. Upper Body Dressing : Min guard, Set up, Sitting Upper Body Dressing Details (indicate cue  type and reason): To doff/don hospital gown. Lower Body Dressing: Minimal assistance, Sit to/from stand, Cueing for safety Lower Body Dressing Details (indicate cue type and reason): Assist for balance in standing. Pt with LOB x3 when attempting to pull up underwear in bathroom. Toilet Transfer: Moderate assistance, Ambulation, Regular Toilet, Grab bars Toilet Transfer Details (indicate cue type and reason): Min assist for balance. Toileting- Clothing Manipulation and Hygiene: Moderate assistance, Sitting/lateral lean Toileting - Clothing Manipulation Details (indicate cue type and reason): able to complete peri hygiene with lateral lean Functional mobility during ADLs: Rolling walker, Moderate assistance General ADL Comments: pt needed cues to stay inside RW, to bring R LE first into the walker and awareness of R side weakness. pt with static standing with R lean and knee flexion. Pt with tapping at quad to activate R LE.   Cognition: Cognition Overall Cognitive Status: Impaired/Different from baseline Orientation Level: Oriented X4 Cognition Arousal/Alertness: Awake/alert Behavior During Therapy: Impulsive Overall Cognitive Status: Impaired/Different from baseline Area of Impairment: Attention, Memory, Safety/judgement, Awareness, Problem solving Current Attention Level: Sustained Memory: Decreased short-term memory Safety/Judgement: Decreased awareness of safety, Decreased  awareness of deficits Awareness: Emergent Problem Solving: Slow processing, Decreased initiation General Comments: Pt with a notebook present in the room writing down all events to help with recall. pt requesting to complete task and needed cues to initiate.     Blood pressure 160/69, pulse 65, temperature 97.9 F (36.6 C), temperature source Oral, resp. rate 20, height 5\' 9"  (1.753 m), weight 84.369 kg (186 lb), SpO2 98 %. Physical Exam  Nursing note and vitals reviewed. Constitutional: He appears well-developed  and well-nourished. He appears listless.  HENT:  Head: Normocephalic and atraumatic.  Mouth/Throat: Oropharynx is clear and moist.  Eyes: Conjunctivae and EOM are normal. Pupils are equal, round, and reactive to light. Right eye exhibits no discharge.  Neck: Normal range of motion. Neck supple.  Cardiovascular:  Irregularly irregular  Respiratory: Effort normal and breath sounds normal. No stridor. No respiratory distress. He has no wheezes.  GI: Soft. Bowel sounds are normal. He exhibits no distension. There is no tenderness.  Musculoskeletal: He exhibits no edema or tenderness.  Neurological: He appears listless.  HOH.  Needed cues to keep eyes open, briefly opens them.  Flat affect with slow speech.  He is able to follow simple motor commands.  Sensation intact to light touch Motor: 4+/5 throughout, ?slight decrease in right hip flexion  Tired, keeps eyes closed for majority of exam   Skin: Skin is warm and dry.  Psychiatric: Judgment normal. His affect is blunt. He is slowed. Cognition and memory are normal.    No results found for this or any previous visit (from the past 48 hour(s)). No results found.   Medical Problem List and Plan: 1.  Unsteady gait and LOB with activity secondary to [facility] and ACA aneurysm s/p coiling. 2. S/p AVR/DVT Prophylaxis/Anticoagulation: coumadin d/c by NS. Now on  Pharmaceutical: Other (comment)-Eliquis 3. Pain Management: Tylenol or ultram prn 4. Mood: LCSW to follow for evaluation and support.  5. Neuropsych: This patient is capable of making decisions on his own behalf. 6. Skin/Wound Care: Routine pressure relief measures.  7. Fluids/Electrolytes/Nutrition: monitor I/O. Check lytes in am. 8. A Fib: Monitor HR bid. On Eliquis and ASA.  9. Headaches: Monitor for now. May need scheduled medication if not controlled by prn.  10 HTN: Monitor BP bid. Continue Norvasc daily.  13. Leukocytosis: Follow labs  Post Admission Physician  Evaluation: 1. Functional deficits secondary  to [facility] and ACA aneurysm s/p coiling. 2. Patient is admitted to receive collaborative, interdisciplinary care between the physiatrist, rehab nursing staff, and therapy team. 3. Patient's level of medical complexity and substantial therapy needs in context of that medical necessity cannot be provided at a lesser intensity of care such as a SNF. 4. Patient has experienced substantial functional loss from his/her baseline which was documented above under the "Functional History" and "Functional Status" headings.  Judging by the patient's diagnosis, physical exam, and functional history, the patient has potential for functional progress which will result in measurable gains while on inpatient rehab.  These gains will be of substantial and practical use upon discharge  in facilitating mobility and self-care at the household level. 5. Physiatrist will provide 24 hour management of medical needs as well as oversight of the therapy plan/treatment and provide guidance as appropriate regarding the interaction of the two. 6. 24 hour rehab nursing will assist with safety, skin/wound care, medication administration, pain management and patient education and help integrate therapy concepts, techniques,education, etc. 7. PT will assess and treat for/with: Lower extremity strength,  range of motion, stamina, balance, functional mobility, safety, adaptive techniques and equipment, coping skills, pain control, education.   Goals are: Mod I/Supervision. 8. OT will assess and treat for/with: ADL's, functional mobility, safety, upper extremity strength, adaptive techniques and equipment, ego support, and community reintegration.   Goals are: Mod I/Supervision. Therapy may proceed with showering this patient. 9. Case Management and Social Worker will assess and treat for psychological issues and discharge planning. 10. Team conference will be held weekly to assess progress toward  goals and to determine barriers to discharge. 11. Patient will receive at least 3 hours of therapy per day at least 5 days per week. 12. ELOS: 8-13 days.       13. Prognosis:  good  Delice Lesch, MD 02/13/2016

## 2016-02-13 NOTE — Care Management Important Message (Signed)
Important Message  Patient Details  Name: Jason Hawkins MRN: [id number] Date of Birth: [birth year]   Medicare Important Message Given:  Yes    Loann Quill 02/13/2016, 9:00 AM

## 2016-02-13 NOTE — H&P (Signed)
Physical Medicine and Rehabilitation Admission H&P    Chief Complaint  Patient presents with  . Gait disorder     HPI:  Jason Hawkins is a 82 y.o. 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 He was found to have [place] and cerebral angiogram revealed 1.5 X 3 mm ACA aneurysm  INR 1.8 and coumadin reversed. BP elevated therefore he was placed on Cardene drip for BP control. He underwent coil embolization of ACA aneurysm on 02/01/16 by Dr. Kathyrn Sheriff.  Post op reported to have CP due to fluid overload and improved with IV diuresis. Headaches improved and therapy ongoing. CIR recommended due to unsteady gait and LOB with activity.    Review of Systems  Eyes: Positive for photophobia. Negative for double vision.  Respiratory: Negative for cough and shortness of breath.   Cardiovascular: Negative for chest pain and palpitations.  Gastrointestinal: Negative for heartburn, nausea and constipation.  Genitourinary: Negative for dysuria and urgency.  Musculoskeletal: Negative for myalgias, back pain and joint pain.  Skin: Negative for itching and rash.  Neurological: Positive for weakness and headaches. Negative for speech change.  Psychiatric/Behavioral: The patient is not nervous/anxious and does not have insomnia.   All other systems reviewed and are negative.     Past Medical History  Diagnosis Date  . Chronic atrial fibrillation ([place])   . Severe aortic stenosis     s/p aortic valve replacement using a pericardial tissue valve  . HTN (hypertension)   . Lower extremity edema   . Hypercholesteremia     Past Surgical History  Procedure Laterality Date  . Aortic valve replacement  08/30/2003    Dr. Cyndia Bent  . Radiology with anesthesia N/A 02/01/2016    Procedure: RADIOLOGY WITH ANESTHESIA;  Surgeon: Consuella Lose, MD;  Location: [facility];  Service: Radiology;  Laterality: N/A;    Family History  Problem Relation Age  of Onset  . Heart attack    . Diabetes    . Diabetes Mother   . Heart disease Father   . Heart attack Father     Social History:  Widowed--has son and daughter in law in town (work). Retired from [employer], [employer]. He reports that he has quit smoking. He does not have any smokeless tobacco history on file. He reports that he drinks about 8.4 oz of alcohol per week. He reports that he does not use illicit drugs   Allergies  Allergen Reactions  . Bee Venom Anaphylaxis  . Codeine     nausea   Medications Prior to Admission  Medication Sig Dispense Refill  . co-enzyme Q-10 50 MG capsule Take 50 mg by mouth daily.    . diphenhydrAMINE (BENADRYL) 25 MG tablet Take 2 tablets (50 mg total) by mouth every 6 (six) hours as needed for itching or allergies (bee sting). 30 tablet 0  . EPINEPHrine 0.3 mg/0.3 mL IJ SOAJ injection Inject 0.3 mg into the muscle once.    [name] GLUCOSAMINE-CHONDROITIN PO Take 1 tablet by mouth daily.      [name] Hyaluronic Acid 20-60 MG CAPS Take 1 tablet by mouth daily.      . L-ARGININE PO Take 1 capsule by mouth 4 (four) times a week.    . L-CITRULLINE PO Take 1 capsule by mouth 4 (four) times a week.    . Multiple Vitamin (MULTIVITAMIN) capsule Take 1 capsule by mouth daily.      [name] OVER THE COUNTER  MEDICATION Take 1 Dose by mouth 4 (four) times a week. Helps with Testerone.    . Red Yeast Rice 600 MG CAPS Take 1 capsule by mouth daily. Reported on 12/28/2015    . TURMERIC PO Take 1 tablet by mouth daily.      [name] warfarin (COUMADIN) 5 MG tablet Take 1 tablet (5 mg total) by mouth as directed. 30 tablet 11    Home: Home Living Family/patient expects to be discharged to:: Private residence Living Arrangements: Alone Available Help at Discharge: Family, Available 24 hours/day Type of Home: House Home Access: Stairs to enter Technical brewer of Steps: 3 Entrance Stairs-Rails: Can reach both Home Layout: One level Bathroom Shower/Tub: Tourist information centre manager: Handicapped height Brady: Grab bars - tub/shower, [place] in   Functional History: Prior Function Level of Independence: Independent  Functional Status:  Mobility: Bed Mobility Overal bed mobility: Modified Independent Bed Mobility: Supine to Sit Supine to sit: Supervision, HOB elevated Sit to supine: Supervision, HOB elevated General bed mobility comments: pt received in and returned to chair Transfers Overall transfer level: Needs assistance Equipment used: Rolling walker (2 wheeled) Transfers: Sit to/from Stand Sit to Stand: Mod assist General transfer comment: cues for hand placement and safety Ambulation/Gait Ambulation/Gait assistance: Min assist, +2 safety/equipment Ambulation Distance (Feet): 180 Feet Assistive device: Rolling walker (2 wheeled) Gait Pattern/deviations: Step-through pattern, Trunk flexed, Narrow base of support, Shuffle General Gait Details: vc's for erect posture and worked on increasing step length. Pt starting to fall asleep even with walking and had to be continually aroused with conversation. Worked on high knee stepping to challenge balance and pt with multiple LOB posterior with this and could not correct to succeed at this pattern. Distance limited by lethargy Gait velocity: decreased Gait velocity interpretation: Below normal speed for age/gender    ADL: ADL Overall ADL's : Needs assistance/impaired Eating/Feeding: Set up, Sitting Grooming: Wash/dry face, Set up, Standing (shaving) Grooming Details (indicate cue type and reason): Min assist for balance in standing Upper Body Bathing: Set up, Supervision/ safety, Sitting Lower Body Bathing: Total assistance, Sit to/from stand Lower Body Bathing Details (indicate cue type and reason): Pt able to clean bottom and upper legs sitting on commode with min guard assist for safety. Upper Body Dressing : Min guard, Set up, Sitting Upper Body Dressing Details (indicate cue  type and reason): To doff/don hospital gown. Lower Body Dressing: Minimal assistance, Sit to/from stand, Cueing for safety Lower Body Dressing Details (indicate cue type and reason): Assist for balance in standing. Pt with LOB x3 when attempting to pull up underwear in bathroom. Toilet Transfer: Moderate assistance, Ambulation, Regular Toilet, Grab bars Toilet Transfer Details (indicate cue type and reason): Min assist for balance. Toileting- Clothing Manipulation and Hygiene: Moderate assistance, Sitting/lateral lean Toileting - Clothing Manipulation Details (indicate cue type and reason): able to complete peri hygiene with lateral lean Functional mobility during ADLs: Rolling walker, Moderate assistance General ADL Comments: pt needed cues to stay inside RW, to bring R LE first into the walker and awareness of R side weakness. pt with static standing with R lean and knee flexion. Pt with tapping at quad to activate R LE.   Cognition: Cognition Overall Cognitive Status: Impaired/Different from baseline Orientation Level: Oriented X4 Cognition Arousal/Alertness: Awake/alert Behavior During Therapy: Impulsive Overall Cognitive Status: Impaired/Different from baseline Area of Impairment: Attention, Memory, Safety/judgement, Awareness, Problem solving Current Attention Level: Sustained Memory: Decreased short-term memory Safety/Judgement: Decreased awareness of safety, Decreased  awareness of deficits Awareness: Emergent Problem Solving: Slow processing, Decreased initiation General Comments: Pt with a notebook present in the room writing down all events to help with recall. pt requesting to complete task and needed cues to initiate.     Blood pressure 160/69, pulse 65, temperature 97.9 F (36.6 C), temperature source Oral, resp. rate 20, height 5\' 9"  (1.753 m), weight 84.369 kg (186 lb), SpO2 98 %. Physical Exam  Nursing note and vitals reviewed. Constitutional: He appears well-developed  and well-nourished. He appears listless.  HENT:  Head: Normocephalic and atraumatic.  Mouth/Throat: Oropharynx is clear and moist.  Eyes: Conjunctivae and EOM are normal. Pupils are equal, round, and reactive to light. Right eye exhibits no discharge.  Neck: Normal range of motion. Neck supple.  Cardiovascular:  Irregularly irregular  Respiratory: Effort normal and breath sounds normal. No stridor. No respiratory distress. He has no wheezes.  GI: Soft. Bowel sounds are normal. He exhibits no distension. There is no tenderness.  Musculoskeletal: He exhibits no edema or tenderness.  Neurological: He appears listless.  HOH.  Needed cues to keep eyes open, briefly opens them.  Flat affect with slow speech.  He is able to follow simple motor commands.  Sensation intact to light touch Motor: 4+/5 throughout, ?slight decrease in right hip flexion  Tired, keeps eyes closed for majority of exam   Skin: Skin is warm and dry.  Psychiatric: Judgment normal. His affect is blunt. He is slowed. Cognition and memory are normal.    No results found for this or any previous visit (from the past 48 hour(s)). No results found.   Medical Problem List and Plan: 1.  Unsteady gait and LOB with activity secondary to [facility] and ACA aneurysm s/p coiling. 2. S/p AVR/DVT Prophylaxis/Anticoagulation: coumadin d/c by NS. Now on  Pharmaceutical: Other (comment)-Eliquis 3. Pain Management: Tylenol or ultram prn 4. Mood: LCSW to follow for evaluation and support.  5. Neuropsych: This patient is capable of making decisions on his own behalf. 6. Skin/Wound Care: Routine pressure relief measures.  7. Fluids/Electrolytes/Nutrition: monitor I/O. Check lytes in am. 8. A Fib: Monitor HR bid. On Eliquis and ASA.  9. Headaches: Monitor for now. May need scheduled medication if not controlled by prn.  10 HTN: Monitor BP bid. Continue Norvasc daily.  13. Leukocytosis: Follow labs  Post Admission Physician  Evaluation: 1. Functional deficits secondary  to [facility] and ACA aneurysm s/p coiling. 2. Patient is admitted to receive collaborative, interdisciplinary care between the physiatrist, rehab nursing staff, and therapy team. 3. Patient's level of medical complexity and substantial therapy needs in context of that medical necessity cannot be provided at a lesser intensity of care such as a SNF. 4. Patient has experienced substantial functional loss from his/her baseline which was documented above under the "Functional History" and "Functional Status" headings.  Judging by the patient's diagnosis, physical exam, and functional history, the patient has potential for functional progress which will result in measurable gains while on inpatient rehab.  These gains will be of substantial and practical use upon discharge  in facilitating mobility and self-care at the household level. 5. Physiatrist will provide 24 hour management of medical needs as well as oversight of the therapy plan/treatment and provide guidance as appropriate regarding the interaction of the two. 6. 24 hour rehab nursing will assist with safety, skin/wound care, medication administration, pain management and patient education and help integrate therapy concepts, techniques,education, etc. 7. PT will assess and treat for/with: Lower extremity strength,  range of motion, stamina, balance, functional mobility, safety, adaptive techniques and equipment, coping skills, pain control, education.   Goals are: Mod I/Supervision. 8. OT will assess and treat for/with: ADL's, functional mobility, safety, upper extremity strength, adaptive techniques and equipment, ego support, and community reintegration.   Goals are: Mod I/Supervision. Therapy may proceed with showering this patient. 9. Case Management and Social Worker will assess and treat for psychological issues and discharge planning. 10. Team conference will be held weekly to assess progress toward  goals and to determine barriers to discharge. 11. Patient will receive at least 3 hours of therapy per day at least 5 days per week. 12. ELOS: 8-13 days.       13. Prognosis:  good  Delice Lesch, MD 02/13/2016

## 2016-02-13 NOTE — Progress Notes (Signed)
Pt complaining of headache. Pt asking for Toradol. Algis Liming, PA called with pt only having prn tylenol. New orders for PRN 50mg -100mg  ultram q6hrs and scheduled Topamax 25mg  HS. RN discussed with pt and daughter that Toradol dose PRN is complete. Discussed and educated pain management with pt and daughter.

## 2016-02-13 NOTE — Interval H&P Note (Signed)
Jason Hawkins was admitted today to Inpatient Rehabilitation with the diagnosis of SAH and ACA aneurysm s/p coiling.  The patient's history has been reviewed, patient examined, and there is no change in status.  Patient continues to be appropriate for intensive inpatient rehabilitation.  I have reviewed the patient's chart and labs.  Questions were answered to the patient's satisfaction. The PAPE has been reviewed and assessment remains appropriate.  [name] 02/13/2016, 5:04 PM

## 2016-02-13 NOTE — Progress Notes (Signed)
Occupational Therapy Treatment Patient Details Name: Jason Hawkins MRN: [id number] DOB: [birth year] Today's Date: 02/13/2016    History of present illness 82 y.o. male with history of atrial fibrillation on coumadin, aortic valve replacement, hypertension, hypercholesterolemia and lower extremity edema presenting with sudden onset severe HA. Blood pressure elevated. CT showed [facility]   OT comments  Pt demonstrates balance deficits and R side weakness during ADL task. Pt requires (A) for sequence and safety. Pt with decr awareness to R side deficits.     Follow Up Recommendations  Supervision/Assistance - 24 hour;CIR    Equipment Recommendations  3 in 1 bedside comode;Other (comment) (RW)    Recommendations for Other Services      Precautions / Restrictions Precautions Precautions: Fall       Mobility Bed Mobility Overal bed mobility: Modified Independent                Transfers Overall transfer level: Needs assistance Equipment used: Rolling walker (2 wheeled) Transfers: Sit to/from Stand Sit to Stand: Mod assist         General transfer comment: cues for hand placement and safety    Balance Overall balance assessment: Needs assistance         Standing balance support: Single extremity supported;During functional activity Standing balance-Leahy Scale: Fair                     ADL Overall ADL's : Needs assistance/impaired     Grooming: Wash/dry face;Set up;Standing (shaving)       Lower Body Bathing: Total assistance;Sit to/from stand           Toilet Transfer: Moderate assistance;Ambulation;Regular Toilet;Grab bars   Toileting- Clothing Manipulation and Hygiene: Moderate assistance;Sitting/lateral lean Toileting - Clothing Manipulation Details (indicate cue type and reason): able to complete peri hygiene with lateral lean     Functional mobility during ADLs: Rolling walker;Moderate assistance General ADL Comments: pt needed cues to  stay inside RW, to bring R LE first into the walker and awareness of R side weakness. pt with static standing with R lean and knee flexion. Pt with tapping at quad to activate R LE.       Vision                     Perception     Praxis      Cognition   Behavior During Therapy: Impulsive Overall Cognitive Status: Impaired/Different from baseline Area of Impairment: Attention;Memory;Safety/judgement;Awareness;Problem solving   Current Attention Level: Sustained Memory: Decreased short-term memory    Safety/Judgement: Decreased awareness of safety;Decreased awareness of deficits Awareness: Emergent Problem Solving: Slow processing;Decreased initiation General Comments: Pt with a notebook present in the room writing down all events to help with recall. pt requesting to complete task and needed cues to initiate.     Extremity/Trunk Assessment               Exercises     Shoulder Instructions       General Comments      Pertinent Vitals/ Pain       Pain Assessment: No/denies pain  Home Living                                          Prior Functioning/Environment              Frequency Min 2X/week  Progress Toward Goals  OT Goals(current goals can now be found in the care plan section)  Progress towards OT goals: Progressing toward goals  Acute Rehab OT Goals Patient Stated Goal: go home OT Goal Formulation: With patient Time For Goal Achievement: 02/17/16 Potential to Achieve Goals: Good ADL Goals Pt Will Perform Grooming: with modified independence;standing Pt Will Perform Upper Body Bathing: with modified independence;sitting;standing Pt Will Perform Lower Body Bathing: with modified independence;sit to/from stand Pt Will Transfer to Toilet: with modified independence;ambulating;regular height toilet Pt Will Perform Toileting - Clothing Manipulation and hygiene: with modified independence;sit to/from stand Pt/caregiver  will Perform Home Exercise Program: Increased strength;Both right and left upper extremity;With theraband;Independently;With written HEP provided  Plan Discharge plan remains appropriate    Co-evaluation                 End of Session Equipment Utilized During Treatment: Gait belt;Rolling walker   Activity Tolerance Patient tolerated treatment well   Patient Left in chair;with call bell/phone within reach;with chair alarm set   Nurse Communication Mobility status;Precautions        Time: [id number] OT Time Calculation (min): 46 min  Charges: OT General Charges $OT Visit: 1 Procedure OT Treatments $Self Care/Home Management : 38-52 mins  Parke Poisson B 02/13/2016, 10:55 AM   Jeri Modena   OTR/L Pager: [phone] Office: [phone] .

## 2016-02-13 NOTE — Care Management Note (Signed)
Case Management Note  Patient Details  Name: Jason Hawkins MRN: [id number] Date of Birth: [birth year]  Subjective/Objective:                    Action/Plan: Recommendation from PT is CIR. CM following for discharge needs.   Expected Discharge Date:                  Expected Discharge Plan:  [place]  In-House Referral:     Discharge planning Services  CM Consult  Post Acute Care Choice:    Choice offered to:     DME Arranged:    DME Agency:     HH Arranged:    [place] Agency:     Status of Service:  In process, will continue to follow  If discussed at Long Length of Stay Meetings, dates discussed:    Additional Comments:  Pollie Friar, RN 02/13/2016, 10:37 AM

## 2016-02-14 ENCOUNTER — Inpatient Hospital Stay (HOSPITAL_COMMUNITY): Payer: Medicare Other | Admitting: Physical Therapy

## 2016-02-14 ENCOUNTER — Inpatient Hospital Stay (HOSPITAL_COMMUNITY): Payer: Medicare Other | Admitting: Speech Pathology

## 2016-02-14 ENCOUNTER — Inpatient Hospital Stay (HOSPITAL_COMMUNITY): Payer: Medicare Other | Admitting: Occupational Therapy

## 2016-02-14 DIAGNOSIS — R269 Unspecified abnormalities of gait and mobility: Secondary | ICD-10-CM

## 2016-02-14 DIAGNOSIS — I69398 Other sequelae of cerebral infarction: Secondary | ICD-10-CM

## 2016-02-14 DIAGNOSIS — I609 Nontraumatic subarachnoid hemorrhage, unspecified: Secondary | ICD-10-CM

## 2016-02-14 LAB — CBC WITH DIFFERENTIAL/PLATELET
BASOS ABS: 0 10*3/uL (ref 0.0–0.1)
BASOS PCT: 0 %
Eosinophils Absolute: 0.3 10*3/uL (ref 0.0–0.7)
Eosinophils Relative: 3 %
HEMATOCRIT: 37.4 % — AB (ref 39.0–52.0)
Hemoglobin: 12.7 g/dL — ABNORMAL LOW (ref 13.0–17.0)
LYMPHS PCT: 12 %
Lymphs Abs: 1.2 10*3/uL (ref 0.7–4.0)
MCH: 31.8 pg (ref 26.0–34.0)
MCHC: 34 g/dL (ref 30.0–36.0)
MCV: 93.5 fL (ref 78.0–100.0)
Monocytes Absolute: 1 10*3/uL (ref 0.1–1.0)
Monocytes Relative: 10 %
NEUTROS ABS: 7.5 10*3/uL (ref 1.7–7.7)
Neutrophils Relative %: 75 %
Platelets: 373 10*3/uL (ref 150–400)
RBC: 4 MIL/uL — AB (ref 4.22–5.81)
RDW: 13 % (ref 11.5–15.5)
WBC: 10 10*3/uL (ref 4.0–10.5)

## 2016-02-14 LAB — COMPREHENSIVE METABOLIC PANEL
ALT: 53 U/L (ref 17–63)
ANION GAP: 7 (ref 5–15)
AST: 23 U/L (ref 15–41)
Albumin: 3.2 g/dL — ABNORMAL LOW (ref 3.5–5.0)
Alkaline Phosphatase: 65 U/L (ref 38–126)
BILIRUBIN TOTAL: 1 mg/dL (ref 0.3–1.2)
BUN: 23 mg/dL — ABNORMAL HIGH (ref 6–20)
CALCIUM: 8.8 mg/dL — AB (ref 8.9–10.3)
CO2: 25 mmol/L (ref 22–32)
Chloride: 100 mmol/L — ABNORMAL LOW (ref 101–111)
Creatinine, Ser: 0.75 mg/dL (ref 0.61–1.24)
Glucose, Bld: 124 mg/dL — ABNORMAL HIGH (ref 65–99)
POTASSIUM: 4 mmol/L (ref 3.5–5.1)
Sodium: 132 mmol/L — ABNORMAL LOW (ref 135–145)
TOTAL PROTEIN: 6.2 g/dL — AB (ref 6.5–8.1)

## 2016-02-14 NOTE — Evaluation (Signed)
Occupational Therapy Assessment and Plan  Patient Details  Name: Jason Hawkins MRN: [id number] Date of Birth: 1927-11-12  OT Diagnosis: cognitive deficits and muscle weakness (generalized) Rehab Potential: Rehab Potential (ACUTE ONLY): Good ELOS:  (11-14 days )   Today's Date: 02/14/2016 OT Individual Time: 1330-1440 OT Individual Time Calculation (min): 70 min     Problem List:  Patient Active Problem List   Diagnosis Date Noted  . Benign essential HTN   . Leukocytosis   . Vascular headache   . SOB (shortness of breath)   . Acute hypoxemic respiratory failure ([place]) 02/04/2016  . Ruptured cerebral aneurysm ([place]) 02/01/2016  . Subarachnoid bleed ([place]) 01/31/2016  . Chronic atrial fibrillation ([place])   . Hypertensive urgency   . SAH (subarachnoid hemorrhage) ([place])   . 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 Well adult exam 01/04/2016  . H/O prostate cancer 04/30/2014  . Fatigue 04/30/2014  . PVD (peripheral vascular disease) with claudication ([place]) 12/21/2013  . Dyslipidemia 12/21/2013  . Elevated BP 05/12/2013  . Bee sting allergy 03/17/2013  . Chronic venous insufficiency 03/17/2013  . Herpes zoster 03/17/2013  . Depression 04/13/2011  . Long term current use of anticoagulant therapy 12/14/2010  . Atrial fibrillation ([place]) 12/27/2009  . Edema 12/27/2009  . S/P AVR 12/27/2009    Past Medical History:  Past Medical History  Diagnosis Date  . Chronic atrial fibrillation ([place])   . Severe aortic stenosis     s/p aortic valve replacement using a pericardial tissue valve  . HTN (hypertension)   . Lower extremity edema   . Hypercholesteremia    Past Surgical History:  Past Surgical History  Procedure Laterality Date  . Aortic valve replacement  08/30/2003    Dr. Cyndia Bent  . Radiology with anesthesia N/A 02/01/2016    Procedure: RADIOLOGY WITH ANESTHESIA;  Surgeon: Consuella Lose, MD;  Location: [facility];  Service: Radiology;   Laterality: N/A;    Assessment & Plan Clinical Impression: Patient is a 82 y.o. year old male with with history if AVR, A fib-on coumadin, HTN who was admitted on 01/31/16 with severe frontal headache. He was found to have [place] and cerebral angiogram revealed 1.5 X 3 mm ACA aneurysm  INR 1.8 and coumadin reversed. BP elevated therefore he was placed on Cardene drip for BP control. He underwent coil embolization of ACA aneurysm on 02/01/16 by Dr. Kathyrn Sheriff. Post op reported to have CP due to fluid overload and improved with IV diuresis. Headaches improved and therapy ongoing. CIR recommended due to unsteady gait and LOB with activity. Patient transferred to CIR on 02/13/2016 .    Patient currently requires mod with basic self-care skills and IADL secondary to decreased cardiorespiratoy endurance and unbalanced muscle activation and generalize weakness.  Prior to hospitalization, patient could complete ADLs and IADLs with independent .  Patient will benefit from skilled intervention to increase independence with basic self-care skills and increase level of independence with iADL prior to discharge home with care partner.  Anticipate patient will require intermittent supervision and follow up home health.  OT - End of Session Activity Tolerance: Tolerates 30+ min activity with multiple rests Endurance Deficit: Yes Endurance Deficit Description: Generalized weakness. Required multiple rest breaks and verbal cues to stay alert.  OT Assessment Rehab Potential (ACUTE ONLY): Good Barriers to Discharge: Decreased caregiver support Barriers to Discharge Comments: Pt lives alone with family nearby, however daughter reports that "they work" so unsure of caregiver availability post d/c.  OT  Patient demonstrates impairments in the following area(s): Balance;Endurance;Motor;Skin Integrity;Vision;Cognition;Edema;Safety;Pain OT Basic ADL's Functional Problem(s): Bathing;Dressing;Grooming;Toileting OT Advanced  ADL's Functional Problem(s): Simple Meal Preparation;Laundry;Light Housekeeping OT Transfers Functional Problem(s): Tub/Shower;Toilet OT Additional Impairment(s): None OT Plan OT Intensity: Minimum of 1-2 x/day, 45 to 90 minutes OT Frequency: 5 out of 7 days OT Duration/Estimated Length of Stay: 11-14 days OT Treatment/Interventions: Balance/vestibular training;Cognitive remediation/compensation;Community reintegration;Discharge planning;DME/adaptive equipment instruction;Disease mangement/prevention;Functional mobility training;Neuromuscular re-education;Patient/family education;Pain management;Self Care/advanced ADL retraining;Therapeutic Activities;Therapeutic Exercise;UE/LE Strength taining/ROM;UE/LE Coordination activities;Visual/perceptual remediation/compensation OT Self Feeding Anticipated Outcome(s): Mod I OT Basic Self-Care Anticipated Outcome(s): Mod I OT Toileting Anticipated Outcome(s): Mod I OT Bathroom Transfers Anticipated Outcome(s): Mod I  OT Recommendation Patient destination: Home Follow Up Recommendations: Home health OT Equipment Recommended: To be determine    Skilled Therapeutic Intervention OT eval completed with focus on pt functional capacity. Pt required multimodal cues for attending deficits over course of session as evidenced by redirect and sequencing cues given during showering tasks. Sit <> stand transfers completed with Min A for safety and due to pt fatigue. Pt exhibited fine motor difficulties when attempting to button up tee shirt. Gross motor difficulties evident via UB tremors while ambulating with RW which pt reports to be "the shakes" and low BUE endurance while shampooing hair in shower for with which clinician assisted.    OT Evaluation Precautions/Restrictions  Precautions Precautions: Fall Restrictions Weight Bearing Restrictions: No General Family/Caregiver Present: Yes Vital Signs Therapy Vitals Temp: 97.4 F (36.3 C) Temp Source:  Oral Pulse Rate: 65 Resp: 16 BP: (!) 136/51 mmHg Patient Position (if appropriate): Sitting Oxygen Therapy SpO2: 99 % O2 Device: Not Delivered Pain Pain Assessment Pain Assessment: No/denies pain Pain Score: 3  Pain Type: Acute pain Pain Location: Head Pain Orientation: Right;Left Pain Descriptors / Indicators: Headache Pain Frequency: Intermittent Pain Onset: Gradual Patients Stated Pain Goal: 0 Pain Intervention(s): Medication (See eMAR) Home Living/Prior Functioning Home Living Available Help at Discharge: Family, Friend(s), Available 24 hours/day Type of Home: House Home Access: Stairs to enter Technical brewer of Steps: 4 Entrance Stairs-Rails: Right Home Layout: One level Bathroom Shower/Tub: Multimedia programmer: Handicapped height  Lives With: Alone IADL History Homemaking Responsibilities: Yes Meal Prep Responsibility: Primary Laundry Responsibility: Primary Cleaning Responsibility: Primary Bill Paying/Finance Responsibility: Primary Shopping Responsibility: Primary Current License: Yes Mode of Transportation: Car Occupation: Retired Leisure and Hobbies:  (Likes to watch television. No other reported hobbies.) Prior Function Level of Independence: Independent with basic ADLs, Independent with homemaking with ambulation, Independent with transfers, Independent with gait  Able to Take Stairs?: Yes Driving: Yes Vocation: Retired Comments: mows his lawn, drives ADL   Vision/Perception  Vision- History Baseline Vision/History: Wears glasses Wears Glasses: At all times Patient Visual Report: No change from baseline Vision- Assessment Vision Assessment?: No apparent visual deficits Perception Comments:  (WFL)  Cognition Overall Cognitive Status: Impaired/Different from baseline Arousal/Alertness: Lethargic Orientation Level: Person;Place;Situation Person: Oriented Place: Oriented Situation: Oriented Year: 2017 Month: June Day of  Week: Correct Memory: Appears intact Memory Impairment: Retrieval deficit;Decreased recall of new information Immediate Memory Recall: Sock;Blue;Bed Memory Recall: Sock;Bed;Blue Memory Recall Sock: Without Cue Memory Recall Blue: Without Cue Memory Recall Bed: Without Cue Attention: Sustained Sustained Attention: Appears intact Sustained Attention Impairment: Verbal basic;Functional basic Awareness: Appears intact Awareness Impairment: Anticipatory impairment Problem Solving: Appears intact Problem Solving Impairment: Functional complex Executive Function: Reasoning Reasoning: Appears intact Sequencing: Impaired Sequencing Impairment: Verbal complex Safety/Judgment: Appears intact Sensation Sensation Light Touch: Appears Intact Hot/Cold: Appears Intact Proprioception: Appears Intact Coordination Gross  Motor Movements are Fluid and Coordinated: No Fine Motor Movements are Fluid and Coordinated: Yes Coordination and Movement Description: Generalized weakness and deconditioning. Finger Nose Finger Test: [facility] Motor  Motor Motor: Abnormal postural alignment and control Mobility  Bed Mobility Bed Mobility: Sit to Supine;Supine to Sit Supine to Sit: With rails;HOB elevated;5: Supervision Sit to Supine: 5: Supervision  Trunk/Postural Assessment     Balance   Extremity/Trunk Assessment RUE Assessment RUE Assessment: Within Functional Limits (MMT 4/5) LUE Assessment LUE Assessment: Within Functional Limits (MMT 4/5)   See Function Navigator for Current Functional Status.   Refer to Care Plan for Long Term Goals  Recommendations for other services: None  Discharge Criteria: Patient will be discharged from OT if patient refuses treatment 3 consecutive times without medical reason, if treatment goals not met, if there is a change in medical status, if patient makes no progress towards goals or if patient is discharged from hospital.  The above assessment, treatment plan,  treatment alternatives and goals were discussed and mutually agreed upon: by patient and by family  Dierdre Searles 02/14/2016, 4:23 PM

## 2016-02-14 NOTE — Progress Notes (Signed)
[name], MD Physician Signed Physical Medicine and Rehabilitation Consult Note 02/13/2016 8:49 AM  Related encounter: ED to Hosp-Admission (Discharged) from 01/31/2016 in [facility] Collapse All        Physical Medicine and Rehabilitation Consult  Reason for Consult: Gait disorder Referring Physician: Dr. Ronnald Ramp   HPI: Jason Hawkins is a 82 y.o. 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 He was found to have [place] and cerebral angiogram revealed 1.5 X 3 mm ACA aneurysm  INR 1.8 and coumadin reversed. BP elevated therefore he was placed on Cardene drip for BP control. He underwent coil embolization of ACA aneurysm on 02/01/16 by Dr. Kathyrn Sheriff. Post op CP due to fluid overload and improved with IV diuresis. Headaches improved and therapy ongoing. CIR recommended due to unsteady gait and LOB with activity.   Lives alone--was independent without AD and drive/maintains home. Sedentary.    Review of Systems  HENT: Positive for hearing loss.  Eyes: Negative for blurred vision, double vision and redness.  Respiratory: Negative for cough and shortness of breath.  Cardiovascular: Negative for chest pain, palpitations and leg swelling.  Gastrointestinal: Negative for heartburn, nausea and constipation.  Genitourinary: Negative for dysuria and urgency.  Musculoskeletal: Negative for joint pain and falls.  Neurological: Positive for weakness and headaches (Sharp flashes). Negative for dizziness.  Psychiatric/Behavioral: Negative for memory loss.  All other systems reviewed and are negative.    Past Medical History  Diagnosis Date  . Chronic atrial fibrillation ([place])   . Severe aortic stenosis     s/p aortic valve replacement using a pericardial tissue valve  . HTN (hypertension)   . Lower extremity edema   . Hypercholesteremia      Past Surgical History  Procedure Laterality Date  . Aortic valve replacement  08/30/2003    Dr. Cyndia Bent  . Radiology with anesthesia N/A 02/01/2016    Procedure: RADIOLOGY WITH ANESTHESIA; Surgeon: Consuella Lose, MD; Location: [facility]; Service: Radiology; Laterality: N/A;    Family History  Problem Relation Age of Onset  . Heart attack    . Diabetes    . Diabetes Mother   . Heart disease Father   . Heart attack Father     Social History: Widowed--has son and daughter in law in town (work). Retired from [employer], therapeutic. He reports that he has quit smoking. He does not have any smokeless tobacco history on file. He reports that he drinks about 8.4 oz of alcohol per week. He reports that he does not use illicit drugs.    Allergies  Allergen Reactions  . Bee Venom Anaphylaxis  . Codeine     nausea    Medications Prior to Admission  Medication Sig Dispense Refill  . co-enzyme Q-10 50 MG capsule Take 50 mg by mouth daily.    . diphenhydrAMINE (BENADRYL) 25 MG tablet Take 2 tablets (50 mg total) by mouth every 6 (six) hours as needed for itching or allergies (bee sting). 30 tablet 0  . EPINEPHrine 0.3 mg/0.3 mL IJ SOAJ injection Inject 0.3 mg into the muscle once.    [name] GLUCOSAMINE-CHONDROITIN PO Take 1 tablet by mouth daily.     [name] Hyaluronic Acid 20-60 MG CAPS Take 1 tablet by mouth daily.     . L-ARGININE PO Take 1 capsule by mouth 4 (four) times a week.    . L-CITRULLINE PO Take  1 capsule by mouth 4 (four) times a week.    . Multiple Vitamin (MULTIVITAMIN) capsule Take 1 capsule by mouth daily.     [name] OVER THE COUNTER MEDICATION Take 1 Dose by mouth 4 (four) times a week. Helps with Testerone.    . Red Yeast Rice 600 MG CAPS Take 1 capsule by mouth daily. Reported on 12/28/2015    . TURMERIC PO Take 1 tablet by mouth daily.     [name] warfarin  (COUMADIN) 5 MG tablet Take 1 tablet (5 mg total) by mouth as directed. 30 tablet 11    Home: Home Living Family/patient expects to be discharged to:: Private residence Living Arrangements: Alone Available Help at Discharge: Family, Available 24 hours/day Type of Home: House Home Access: Stairs to enter Technical brewer of Steps: 3 Entrance Stairs-Rails: Can reach both Home Layout: One level Bathroom Shower/Tub: Multimedia programmer: Handicapped height Spur: Grab bars - tub/shower, [place] in  Functional History: Prior Function Level of Independence: Independent Functional Status:  Mobility: Bed Mobility Overal bed mobility: Needs Assistance Bed Mobility: Supine to Sit Supine to sit: Supervision, HOB elevated Sit to supine: Supervision, HOB elevated General bed mobility comments: pt received in and returned to chair Transfers Overall transfer level: Needs assistance Equipment used: Rolling walker (2 wheeled) Transfers: Sit to/from Stand Sit to Stand: Mod assist, +2 safety/equipment General transfer comment: mod A due to strong posterior lean initially, +2 for equipment and safety. Took pt nearly a minute to maintain wt forward to begin to ambulate Ambulation/Gait Ambulation/Gait assistance: Min assist, +2 safety/equipment Ambulation Distance (Feet): 180 Feet Assistive device: Rolling walker (2 wheeled) Gait Pattern/deviations: Step-through pattern, Trunk flexed, Narrow base of support, Shuffle General Gait Details: vc's for erect posture and worked on increasing step length. Pt starting to fall asleep even with walking and had to be continually aroused with conversation. Worked on high knee stepping to challenge balance and pt with multiple LOB posterior with this and could not correct to succeed at this pattern. Distance limited by lethargy Gait velocity: decreased Gait velocity interpretation: Below normal speed for age/gender     ADL: ADL Overall ADL's : Needs assistance/impaired Eating/Feeding: Set up, Sitting Grooming: Minimal assistance, Standing, Wash/dry hands Grooming Details (indicate cue type and reason): Min assist for balance in standing Upper Body Bathing: Set up, Supervision/ safety, Sitting Lower Body Bathing: Min guard, Sitting/lateral leans Lower Body Bathing Details (indicate cue type and reason): Pt able to clean bottom and upper legs sitting on commode with min guard assist for safety. Upper Body Dressing : Min guard, Set up, Sitting Upper Body Dressing Details (indicate cue type and reason): To doff/don hospital gown. Lower Body Dressing: Minimal assistance, Sit to/from stand, Cueing for safety Lower Body Dressing Details (indicate cue type and reason): Assist for balance in standing. Pt with LOB x3 when attempting to pull up underwear in bathroom. Toilet Transfer: Minimal assistance, Ambulation, Regular Toilet, RW, Grab bars Toilet Transfer Details (indicate cue type and reason): Min assist for balance. Toileting- Water quality scientist and Hygiene: Min guard, Sitting/lateral lean Functional mobility during ADLs: Minimal assistance, Rolling walker General ADL Comments: Pt very unsteady at start of session and during ADL activity. Pt with posterior LOB x 2 and L lateral LOB x 1 during functional tasks in standing. Pt with urinary incontinence; unable to identify that he was wet in bed.  Cognition: Cognition Overall Cognitive Status: Impaired/Different from baseline Orientation Level: Oriented X4 Cognition Arousal/Alertness: Lethargic, Suspect due  to medications Behavior During Therapy: Impulsive Overall Cognitive Status: Impaired/Different from baseline Area of Impairment: Attention, Safety/judgement, Problem solving Current Attention Level: Sustained Memory: Decreased short-term memory Safety/Judgement: Decreased awareness of safety Awareness: Emergent Problem Solving: Requires verbal  cues, Slow processing General Comments: pt lethargic even while mobilizing   Blood pressure 160/55, pulse 50, temperature 98.4 F (36.9 C), temperature source Oral, resp. rate 14, height 5\' 9"  (1.753 m), weight 84.369 kg (186 lb), SpO2 97 %. Physical Exam  Nursing note and vitals reviewed. Constitutional: He is oriented to person, place, and time. He appears well-developed and well-nourished. He appears listless. No distress.  HENT:  Head: Normocephalic and atraumatic.  Eyes: Conjunctivae are normal. Pupils are equal, round, and reactive to light.  Neck: Neck supple. No tracheal deviation present.  Cardiovascular: An irregularly irregular rhythm present.  Respiratory: No stridor. No respiratory distress. He has decreased breath sounds in the right lower field and the left lower field. He has no wheezes.  GI: Soft. Bowel sounds are normal. He exhibits no distension. There is no tenderness.  Musculoskeletal: He exhibits no edema or tenderness.  Neurological: He is oriented to person, place, and time. He appears listless.  HOH.  Needed cues to keep eyes open, briefly opens them.  Flat affect with slow speech.  He is able to follow simple motor commands.  Sensation intact to light touch Motor: 4+/5 throughout, ?slight decrease in right hip flexion  Skin: Skin is warm and dry. He is not diaphoretic.  Psychiatric: His affect is blunt. He is slowed. Cognition and memory are normal.  Tired, keeps eyes closed for majority of exam     Lab Results Last 24 Hours    No results found for this or any previous visit (from the past 24 hour(s)).    Imaging Results (Last 48 hours)    No results found.    Assessment/Plan: Diagnosis: SAH and ACA aneurysm s/p coiling Labs and images independently reviewed. Records reviewed and summated above.  1. Does the need for close, 24 hr/day medical supervision in concert with the patient's rehab needs make it unreasonable for this patient to be served in  a less intensive setting? Yes  2. Co-Morbidities requiring supervision/potential complications: S/P AVR, A fib (monitor HE with increased activity, cont meds), HTN (monitor and provide prns in accordance with increased physical exertion and pain), leukocytosis (cont to monitor for signs and symptoms of infection, further workup if indicated) 3. Due to safety, skin/wound care, medication administration, pain management and patient education, does the patient require 24 hr/day rehab nursing? Yes 4. Does the patient require coordinated care of a physician, rehab nurse, PT (1-2 hrs/day, 5 days/week) and OT (1-2 hrs/day, 5 days/week) to address physical and functional deficits in the context of the above medical diagnosis(es)? Yes Addressing deficits in the following areas: balance, endurance, locomotion, strength, transferring, bathing, dressing, toileting and psychosocial support 5. Can the patient actively participate in an intensive therapy program of at least 3 hrs of therapy per day at least 5 days per week? Potentially 6. The potential for patient to make measurable gains while on inpatient rehab is excellent 7. Anticipated functional outcomes upon discharge from inpatient rehab are modified independent and supervision with PT, independent and modified independent with OT, n/a with SLP. 8. Estimated rehab length of stay to reach the above functional goals is: 13-17 days. 9. Does the patient have adequate social supports and living environment to accommodate these discharge functional goals? Potentially 10. Anticipated D/C setting:  Home 11. Anticipated post D/C treatments: HH therapy and Home excercise program 12. Overall Rehab/Functional Prognosis: good  RECOMMENDATIONS: This patient's condition is appropriate for continued rehabilitative care in the following setting: Likely CIR. Will clarify caregiver support at discharge. Patient has agreed to participate in recommended program.  Potentially Note that insurance prior authorization may be required for reimbursement for recommended care.  Comment: Rehab Admissions Coordinator to follow up.  Delice Lesch, MD 02/13/2016       Revision History     Date/Time User Provider Type Action   02/13/2016 11:14 AM [name], MD Physician Sign   02/13/2016 10:19 AM Bary Leriche, PA-C Physician Assistant Pend   View Details Report       Routing History     Date/Time From To Method   02/13/2016 11:14 AM [name], MD Cassandria Anger, MD In [facility]

## 2016-02-14 NOTE — Progress Notes (Signed)
[name], RN Rehab Admission Coordinator Signed Physical Medicine and Rehabilitation PMR Pre-admission 02/13/2016 1:47 PM  Related encounter: ED to Hosp-Admission (Discharged) from 01/31/2016 in [place] Collapse All   PMR Admission Coordinator Pre-Admission Assessment  Patient: Jason Hawkins is an 82 y.o., male MRN: [id number] DOB: [birth year] Height: 5\' 9"  (175.3 cm) Weight: 84.369 kg (186 lb)  Insurance Information HMO: PPO: PCP: IPA: 80/20: OTHER:  PRIMARY: Medicare A/B Policy#: [id number] Subscriber: Barbette Reichmann CM Name: Phone#: Fax#:  Pre-Cert#: Employer: Retired Benefits: Phone #: Name: Checked in palmetto Eff. Date: 12/05/94 Deduct: $1316 Out of Pocket Max: none Life Max: unlimited CIR: 100% SNF: 100 days Outpatient: 80% Co-Pay: 20% Home Health: 100% Co-Pay: none DME: 80% Co-Pay: 20% Providers: patient's choice  SECONDARY: Generic commercial Policy#: [id number] Subscriber: Barbette Reichmann CM Name: Phone#: Fax#:  Pre-Cert#: Employer: Retired - Leisure centre manager Benefits: Phone #: [phone] Name:  Eff. Date: Deduct: Out of Pocket Max: Life Max:  CIR: SNF:  Outpatient: Co-Pay:  Home Health: Co-Pay:  DME: Co-Pay:   Emergency Contact Information Contact Information    Name Relation Home Work [place] Son   [phone]   Hodgins,Leesa Daughter   [phone]     Current Medical History  Patient Admitting Diagnosis: SAH and ACA aneurysm s/p coiling  History of Present Illness: An 82 y.o. male with history if AVR, A fib-on  coumadin, HTN who was admitted on 01/31/16 with severe frontal headache. He was found to have [place] and cerebral angiogram revealed 1.5 X 3 mm ACA aneurysm. INR 1.8 and coumadin reversed. BP elevated therefore he was placed on Cardene drip for BP control. He underwent coil embolization of ACA aneurysm on 02/01/16 by Dr. Kathyrn Sheriff. Post op CP due to fluid overload and improved with IV diuresis. Headaches improved and therapy ongoing. Patient to start on blood thinner Eliquis 2.5 mg po BID. CIR recommended due to unsteady gait and LOB with activity.   Lives alone--was independent without AD and drive/maintains home. Sedentary.   Total: 1=NIH  Past Medical History  Past Medical History  Diagnosis Date  . Chronic atrial fibrillation ([place])   . Severe aortic stenosis     s/p aortic valve replacement using a pericardial tissue valve  . HTN (hypertension)   . Lower extremity edema   . Hypercholesteremia     Family History  family history includes Diabetes in his mother; Heart attack in his father; Heart disease in his father.  Prior Rehab/Hospitalizations: Had valve surgery about 12 years ago and had leg fractures many years ago.  Has the patient had major surgery during 100 days prior to admission? No  Current Medications   Current facility-administered medications:  . acetaminophen (TYLENOL) tablet 650 mg, 650 mg, Oral, Q4H PRN, 650 mg at 02/10/16 1725 **OR** acetaminophen (TYLENOL) solution 650 mg, 650 mg, Oral, Q4H PRN, Erline Levine, MD . amLODipine (NORVASC) tablet 2.5 mg, 2.5 mg, Oral, Daily, Donzetta Starch, NP, 2.5 mg at 02/13/16 1024 . apixaban (ELIQUIS) tablet 2.5 mg, 2.5 mg, Oral, Q12H, Consuella Lose, MD . aspirin tablet 325 mg, 325 mg, Oral, Daily, Consuella Lose, MD, 325 mg at 02/13/16 1228 . dextromethorphan-guaiFENesin (MUCINEX DM) 30-600 MG per 12 hr tablet 1 tablet, 1 tablet, Oral, BID, Brand Males, MD, 1 tablet at 02/13/16 1024 .  docusate sodium (COLACE) capsule 100 mg, 100 mg, Oral, BID, Erline Levine, MD, 100 mg at 02/13/16 1228 . ketorolac (TORADOL) tablet  10 mg, 10 mg, Oral, Q6H PRN, Consuella Lose, MD, 10 mg at 02/13/16 1228 . labetalol (NORMODYNE,TRANDATE) injection 5-10 mg, 5-10 mg, Intravenous, Q1H PRN, Eustace Moore, MD . morphine 2 MG/ML injection 1-4 mg, 1-4 mg, Intravenous, Q1H PRN, Erline Levine, MD, 2 mg at 02/12/16 2148 . niMODipine (NIMOTOP) capsule 60 mg, 60 mg, Oral, Q4H, 60 mg at 02/13/16 1024 **OR** NiMODipine (NYMALIZE) 60 MG/20ML oral solution 60 mg, 60 mg, Per Tube, Q4H, Erline Levine, MD . ondansetron (ZOFRAN-ODT) disintegrating tablet 4 mg, 4 mg, Oral, Q6H PRN **OR** ondansetron (ZOFRAN) injection 4 mg, 4 mg, Intravenous, Q6H PRN, Erline Levine, MD, 4 mg at 02/11/16 0358 . pantoprazole (PROTONIX) EC tablet 40 mg, 40 mg, Oral, Daily, 40 mg at 02/13/16 1228 **OR** pantoprazole sodium (PROTONIX) 40 mg/20 mL oral suspension 40 mg, 40 mg, Per Tube, Daily, Erline Levine, MD . sodium chloride flush (NS) 0.9 % injection 3 mL, 3 mL, Intravenous, Q12H, Brand Males, MD, 3 mL at 02/13/16 1026 . sodium chloride flush (NS) 0.9 % injection 3 mL, 3 mL, Intravenous, PRN, Brand Males, MD . traMADol Veatrice Bourbon) tablet 50-100 mg, 50-100 mg, Oral, Q6H PRN, Erline Levine, MD, 100 mg at 02/13/16 1024  Patients Current Diet: Diet Heart Room service appropriate?: Yes; Fluid consistency:: Thin  Precautions / Restrictions Precautions Precautions: Fall Restrictions Weight Bearing Restrictions: No   Has the patient had 2 or more falls or a fall with injury in the past year?No  Prior Activity Level Limited Community (1-2x/wk): Goes out about 2 X a week  Development worker, international aid / Equipment Home Assistive Devices/Equipment: Wellsite geologist, Hearing aid Home Equipment: Grab bars - tub/shower, Shower seat - built in  Prior Device Use: Indicate devices/aids used by the patient prior to current illness, exacerbation  or injury? None  Prior Functional Level Prior Function Level of Independence: Independent  Self Care: Did the patient need help bathing, dressing, using the toilet or eating? Independent  Indoor Mobility: Did the patient need assistance with walking from room to room (with or without device)? Independent  Stairs: Did the patient need assistance with internal or external stairs (with or without device)? Independent  Functional Cognition: Did the patient need help planning regular tasks such as shopping or remembering to take medications? Independent  Current Functional Level Cognition  Overall Cognitive Status: Impaired/Different from baseline Current Attention Level: Sustained Orientation Level: Oriented X4 Safety/Judgement: Decreased awareness of safety, Decreased awareness of deficits General Comments: Pt with a notebook present in the room writing down all events to help with recall. pt requesting to complete task and needed cues to initiate.    Extremity Assessment (includes Sensation/Coordination)  Upper Extremity Assessment: Overall WFL for tasks assessed (decreased fine motor coordination with functional tasks)  Lower Extremity Assessment: Defer to PT evaluation    ADLs  Overall ADL's : Needs assistance/impaired Eating/Feeding: Set up, Sitting Grooming: Wash/dry face, Set up, Standing (shaving) Grooming Details (indicate cue type and reason): Min assist for balance in standing Upper Body Bathing: Set up, Supervision/ safety, Sitting Lower Body Bathing: Total assistance, Sit to/from stand Lower Body Bathing Details (indicate cue type and reason): Pt able to clean bottom and upper legs sitting on commode with min guard assist for safety. Upper Body Dressing : Min guard, Set up, Sitting Upper Body Dressing Details (indicate cue type and reason): To doff/don hospital gown. Lower Body Dressing: Minimal assistance, Sit to/from stand, Cueing for safety Lower Body  Dressing Details (indicate cue type and reason): Assist for balance in standing.  Pt with LOB x3 when attempting to pull up underwear in bathroom. Toilet Transfer: Moderate assistance, Ambulation, Regular Toilet, Grab bars Toilet Transfer Details (indicate cue type and reason): Min assist for balance. Toileting- Clothing Manipulation and Hygiene: Moderate assistance, Sitting/lateral lean Toileting - Clothing Manipulation Details (indicate cue type and reason): able to complete peri hygiene with lateral lean Functional mobility during ADLs: Rolling walker, Moderate assistance General ADL Comments: pt needed cues to stay inside RW, to bring R LE first into the walker and awareness of R side weakness. pt with static standing with R lean and knee flexion. Pt with tapping at quad to activate R LE.     Mobility  Overal bed mobility: Modified Independent Bed Mobility: Supine to Sit Supine to sit: Supervision, HOB elevated Sit to supine: Supervision, HOB elevated General bed mobility comments: pt received in and returned to chair    Transfers  Overall transfer level: Needs assistance Equipment used: Rolling walker (2 wheeled) Transfers: Sit to/from Stand Sit to Stand: Mod assist General transfer comment: cues for hand placement and safety    Ambulation / Gait / Stairs / Wheelchair Mobility  Ambulation/Gait Ambulation/Gait assistance: Min assist, +2 safety/equipment Ambulation Distance (Feet): 180 Feet Assistive device: Rolling walker (2 wheeled) Gait Pattern/deviations: Step-through pattern, Trunk flexed, Narrow base of support, Shuffle General Gait Details: vc's for erect posture and worked on increasing step length. Pt starting to fall asleep even with walking and had to be continually aroused with conversation. Worked on high knee stepping to challenge balance and pt with multiple LOB posterior with this and could not correct to succeed at this pattern. Distance limited by  lethargy Gait velocity: decreased Gait velocity interpretation: Below normal speed for age/gender    Posture / Balance Dynamic Sitting Balance Sitting balance - Comments: difficulty wt shifting fwd Balance Overall balance assessment: Needs assistance Sitting-balance support: Feet supported Sitting balance-Leahy Scale: Fair Sitting balance - Comments: difficulty wt shifting fwd Postural control: Posterior lean Standing balance support: Single extremity supported, During functional activity Standing balance-Leahy Scale: Fair Standing balance comment: UE support needed as well as min A    Special needs/care consideration BiPAP/CPAP No CPM No Continuous Drip IV No  Dialysis No  Life Vest No Oxygen No Special Bed No Trach Size No Wound Vac (area) No  Skin NO  Bowel mgmt: Last BM 02/10/16 Bladder mgmt: Voiding in urinal Diabetic mgmt No    Previous Home Environment Living Arrangements: Alone Available Help at Discharge: Family, Available 24 hours/day Type of Home: House Home Layout: One level Home Access: Stairs to enter Entrance Stairs-Rails: Can reach both Entrance Stairs-Number of Steps: 3 Bathroom Shower/Tub: Multimedia programmer: Handicapped height Home Care Services: No  Discharge Living Setting Plans for Discharge Living Setting: Patient's home, Alone, House (Lives alone, family to stay with him.) One son lives close by on adjacent property. Type of Home at Discharge: House Discharge Home Layout: One level Discharge Home Access: Stairs to enter Entrance Stairs-Number of Steps: 3-5 steps entry Does the patient have any problems obtaining your medications?: No  Social/Family/Support Systems Patient Roles: Parent, Other (Comment) (Has a daughter, 2 sons, 2 grandchildren, brothers.) Contact Information: Hanniel Picardo - son - [phone] Anticipated Caregiver: Daughter and sons Anticipated Caregiver's  Contact Information: Angie Fava - daughter - [phone] Ability/Limitations of Caregiver: Family will make sure that someone stays with patient after discharge Caregiver Availability: Other (Comment) (Family to take turns staying with patient.) Discharge Plan Discussed with Primary Caregiver: Yes Is Caregiver  In Agreement with Plan?: Yes Does Caregiver/Family have Issues with Lodging/Transportation while Pt is in Rehab?: No  Goals/Additional Needs Patient/Family Goal for Rehab: PT/OT mod I and supervision goals Expected length of stay: 13-17 days Cultural Considerations: Lutheran Dietary Needs: Heart diet with thin liquids Equipment Needs: TBD Pt/Family Agrees to Admission and willing to participate: Yes Program Orientation Provided & Reviewed with Pt/Caregiver Including Roles & Responsibilities: Yes  Decrease burden of Care through IP rehab admission: N/A  Possible need for SNF placement upon discharge: Not planned  Patient Condition: This patient's condition remains as documented in the consult dated 02/13/16, in which the Rehabilitation Physician determined and documented that the patient's condition is appropriate for intensive rehabilitative care in an inpatient rehabilitation facility. I have clarified that family will provide caregiver support upon discharge from rehab. Will admit to inpatient rehab today.  Preadmission Screen Completed By: [name], 02/13/2016 2:38 PM ______________________________________________________________________  Discussed status with Dr. Posey Pronto on 02/13/16 at 1436 and received telephone approval for admission today.  Admission Coordinator: [name], time1436/Date07/10/17          Cosigned by: [name], MD at 02/13/2016 2:52 PM  Revision History     Date/Time User Provider Type Action   02/13/2016 2:52 PM [name], MD Physician Cosign   02/13/2016 2:38 PM [name], RN Rehab Admission Coordinator  Sign

## 2016-02-14 NOTE — Evaluation (Signed)
Speech Language Pathology Assessment and Plan  Patient Details  Name: Jason Hawkins MRN: [id number] Date of Birth: Jan 14, 1928  SLP Diagnosis: Cognitive Impairments;Speech and Language deficits  Rehab Potential: Good ELOS: 12-14 days    Today's Date: 02/14/2016 SLP Individual Time: 1100-1200 SLP Individual Time Calculation (min): 60 min   Problem List:  Patient Active Problem List   Diagnosis Date Noted  . Benign essential HTN   . Leukocytosis   . Vascular headache   . SOB (shortness of breath)   . Acute hypoxemic respiratory failure ([place]) 02/04/2016  . Ruptured cerebral aneurysm ([place]) 02/01/2016  . Subarachnoid bleed ([place]) 01/31/2016  . Chronic atrial fibrillation ([place])   . Hypertensive urgency   . SAH (subarachnoid hemorrhage) ([place])   . 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 Well adult exam 01/04/2016  . H/O prostate cancer 04/30/2014  . Fatigue 04/30/2014  . PVD (peripheral vascular disease) with claudication ([place]) 12/21/2013  . Dyslipidemia 12/21/2013  . Elevated BP 05/12/2013  . Bee sting allergy 03/17/2013  . Chronic venous insufficiency 03/17/2013  . Herpes zoster 03/17/2013  . Depression 04/13/2011  . Long term current use of anticoagulant therapy 12/14/2010  . Atrial fibrillation ([place]) 12/27/2009  . Edema 12/27/2009  . S/P AVR 12/27/2009   Past Medical History:  Past Medical History  Diagnosis Date  . Chronic atrial fibrillation ([place])   . Severe aortic stenosis     s/p aortic valve replacement using a pericardial tissue valve  . HTN (hypertension)   . Lower extremity edema   . Hypercholesteremia    Past Surgical History:  Past Surgical History  Procedure Laterality Date  . Aortic valve replacement  08/30/2003    Dr. Cyndia Bent  . Radiology with anesthesia N/A 02/01/2016    Procedure: RADIOLOGY WITH ANESTHESIA;  Surgeon: Consuella Lose, MD;  Location: [facility];  Service: Radiology;  Laterality: N/A;    Assessment /  Plan / Recommendation Clinical Impression Jason Hawkins is a 82 y.o. 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 He was found to have [place] and cerebral angiogram revealed 1.5 X 3 mm ACA aneurysm. INR 1.8 and coumadin reversed. BP elevated therefore he was placed on Cardene drip for BP control. He underwent coil embolization of ACA aneurysm on 02/01/16 by Dr. Kathyrn Sheriff. Post op reported to have CP due to fluid overload and improved with IV diuresis. Headaches improved and therapy ongoing. CIR recommended due to unsteady gait and LOB with activity.   Patient was admitted to [facility] 02/13/16 and demonstrates moderate cognitive impairments characterized by impaired sustained attention as a result of suspected fatigue which impacts all higher level abilities and the patient's overall safety with functional self-care tasks. Patient also demonstrates difficulty with object and picture naming tasks.  Patient would benefit from skilled SLP intervention in order to maximize their functional independence prior to discharge. Hopeful that once arousal and sustained attention improves so will recall and naming, there fore goals set for Mod I with basic problem solving task.  Anticipate patient will require Intermittent supervision for complex home management tasks and follow up outpatient SLP services.    Skilled Therapeutic Interventions          Cognitive-linguistic evaluation completed with results and recommendations reviewed with patient.  Skilled treatment initiated with focus on addressing sustained attention goal. SLP facilitated session by providing Mod assist verbal cues for redirection back to task at 15-30 second  increments.         SLP Assessment  Patient will need skilled [facility] Pathology Services during CIR admission    Recommendations  Oral Care Recommendations: Oral care BID Patient destination:  Home Follow up Recommendations: Other (comment) (TBD ) Equipment Recommended: None recommended by SLP    SLP Frequency 3 to 5 out of 7 days   SLP Duration  SLP Intensity  SLP Treatment/Interventions 13-17 days   Minumum of 1-2 x/day, 30 to 90 minutes  Cognitive remediation/compensation;Cueing hierarchy;Environmental controls;Functional tasks;Medication managment;Patient/family education;Speech/Language facilitation    Pain Pain Assessment Pain Assessment: No/denies pain Pain Score: 3  Pain Type: Acute pain Pain Location: Head Pain Orientation: Right;Left Pain Descriptors / Indicators: Headache Pain Frequency: Intermittent Pain Onset: Gradual Patients Stated Pain Goal: 0 Pain Intervention(s): Medication (See eMAR)  Prior Functioning Cognitive/Linguistic Baseline: Within functional limits Type of Home: House  Lives With: Alone Available Help at Discharge: Family;Available 24 hours/day  Function:  Cognition Comprehension Comprehension assist level: Follows basic conversation/direction with extra time/assistive device  Expression   Expression assist level: Expresses basic needs/ideas: With extra time/assistive device  Social Interaction Social Interaction assist level: Interacts appropriately 90% of the time - Needs monitoring or encouragement for participation or interaction.  Problem Solving Problem solving assist level: Solves basic problems with no assist  Memory Memory assist level: Recognizes or recalls 75 - 89% of the time/requires cueing 10 - 24% of the time   Short Term Goals: Week 1: SLP Short Term Goal 1 (Week 1): Pt will sustian attention to basic self-care tasks for 2-3 minutes with Min verbal cues for redirection.   SLP Short Term Goal 2 (Week 1): Pt will utilize word retreival strategies during moments of anomia in expression of daily information, needs, and wants with Min verbal cues. SLP Short Term Goal 3 (Week 1): Patient will utilize external aids to  assist with reall of daily and new information with Supervision level verbal cues. SLP Short Term Goal 4 (Week 1): Patient will solve basic problems with Supervision level verbal cues.  Refer to Care Plan for Long Term Goals  Recommendations for other services: None  Discharge Criteria: Patient will be discharged from SLP if patient refuses treatment 3 consecutive times without medical reason, if treatment goals not met, if there is a change in medical status, if patient makes no progress towards goals or if patient is discharged from hospital.  The above assessment, treatment plan, treatment alternatives and goals were discussed and mutually agreed upon: by patient  Gunnar Fusi, M.A., CCC-SLP [phone]  [place] 02/14/2016, 12:59 PM

## 2016-02-14 NOTE — Progress Notes (Signed)
Patient information reviewed and entered into eRehab system by Deni Lefever, RN, CRRN, PPS Coordinator.  Information including medical coding and functional independence measure will be reviewed and updated through discharge.     Per nursing patient was given "Data Collection Information Summary for Patients in Inpatient Rehabilitation Facilities with attached "Privacy Act Statement-Health Care Records" upon admission.  

## 2016-02-14 NOTE — Care Management Note (Signed)
Inpatient Rehabilitation Center Individual Statement of Services  Patient Name:  Jason Hawkins  Date:  02/14/2016  Welcome to the [facility].  Our goal is to provide you with an individualized program based on your diagnosis and situation, designed to meet your specific needs.  With this comprehensive rehabilitation program, you will be expected to participate in at least 3 hours of rehabilitation therapies Monday-Friday, with modified therapy programming on the weekends.  Your rehabilitation program will include the following services:  Physical Therapy (PT), Occupational Therapy (OT), Speech Therapy (ST), 24 hour per day rehabilitation nursing, Therapeutic Recreaction (TR), Neuropsychology, Case Management (Social Worker), Rehabilitation Medicine, Nutrition Services and Pharmacy Services  Weekly team conferences will be held on Tuesdays to discuss your progress.  Your Social Worker will talk with you frequently to get your input and to update you on team discussions.  Team conferences with you and your family in attendance may also be held.  Expected length of stay: 10-12 days  Overall anticipated outcome: supervision  Depending on your progress and recovery, your program may change. Your Social Worker will coordinate services and will keep you informed of any changes. Your Social Worker's name and contact numbers are listed  below.  The following services may also be recommended but are not provided by the [place] will be made to provide these services after discharge if needed.  Arrangements include referral to agencies that provide these services.  Your insurance has been verified to be:  Medicare  Your primary doctor is:  Dr. Alain Marion  Pertinent information will be shared with your doctor and your  insurance company.  Social Worker:  [place], [place] or ([phone]   Information discussed with and copy given to patient by: Lennart Pall, 02/14/2016, 4:59 PM

## 2016-02-14 NOTE — Evaluation (Signed)
Physical Therapy Assessment and Plan  Patient Details  Name: Jason Hawkins MRN: [id number] Date of Birth: Dec 12, 1927  PT Diagnosis: Abnormal posture, Abnormality of gait, Cognitive deficits, Difficulty walking and Muscle weakness Rehab Potential: Good ELOS: 10-12 days   Today's Date: 02/14/2016 PT Individual Time: 1000-1100 PT Individual Time Calculation (min): 60 min    Problem List:  Patient Active Problem List   Diagnosis Date Noted  . Benign essential HTN   . Leukocytosis   . Vascular headache   . SOB (shortness of breath)   . Acute hypoxemic respiratory failure ([place]) 02/04/2016  . Ruptured cerebral aneurysm ([place]) 02/01/2016  . Subarachnoid bleed ([place]) 01/31/2016  . Chronic atrial fibrillation ([place])   . Hypertensive urgency   . SAH (subarachnoid hemorrhage) ([place])   . 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 Well adult exam 01/04/2016  . H/O prostate cancer 04/30/2014  . Fatigue 04/30/2014  . PVD (peripheral vascular disease) with claudication ([place]) 12/21/2013  . Dyslipidemia 12/21/2013  . Elevated BP 05/12/2013  . Bee sting allergy 03/17/2013  . Chronic venous insufficiency 03/17/2013  . Herpes zoster 03/17/2013  . Depression 04/13/2011  . Long term current use of anticoagulant therapy 12/14/2010  . Atrial fibrillation ([place]) 12/27/2009  . Edema 12/27/2009  . S/P AVR 12/27/2009    Past Medical History:  Past Medical History  Diagnosis Date  . Chronic atrial fibrillation ([place])   . Severe aortic stenosis     s/p aortic valve replacement using a pericardial tissue valve  . HTN (hypertension)   . Lower extremity edema   . Hypercholesteremia    Past Surgical History:  Past Surgical History  Procedure Laterality Date  . Aortic valve replacement  08/30/2003    Dr. Cyndia Bent  . Radiology with anesthesia N/A 02/01/2016    Procedure: RADIOLOGY WITH ANESTHESIA;  Surgeon: Consuella Lose, MD;  Location: [facility];  Service: Radiology;   Laterality: N/A;    Assessment & Plan Clinical Impression: Jason Hawkins is a 82 y.o. 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 He was found to have [place] and cerebral angiogram revealed 1.5 X 3 mm ACA aneurysm. INR 1.8 and coumadin reversed. BP elevated therefore he was placed on Cardene drip for BP control. He underwent coil embolization of ACA aneurysm on 02/01/16 by Dr. Kathyrn Sheriff. Post op reported to have CP due to fluid overload and improved with IV diuresis. Headaches improved and therapy ongoing. CIR recommended due to unsteady gait and LOB with activity.  Patient transferred to CIR on 02/13/2016.   Patient currently requires min with mobility secondary to muscle weakness, decreased cardiorespiratoy endurance and decreased standing balance, decreased postural control and decreased balance strategies.  Prior to hospitalization, patient was independent  with mobility and lived with Alone in a House home.  Home access is 4Stairs to enter.  Patient will benefit from skilled PT intervention to maximize safe functional mobility, minimize fall risk and decrease caregiver burden for planned discharge home with 24 hour supervision.  Anticipate patient will benefit from follow up [place] at discharge.  PT - End of Session Activity Tolerance: Decreased this session;Tolerates 10 - 20 min activity with multiple rests Endurance Deficit: Yes Endurance Deficit Description: required multiple rest breaks including prolonged supine rest due to fatigue PT Assessment Rehab Potential (ACUTE/IP ONLY): Good PT Patient demonstrates impairments in the following area(s): Balance;Endurance;Motor;Pain PT Transfers Functional Problem(s): Bed Mobility;Bed to Chair;Car;Furniture;Floor PT Locomotion Functional Problem(s):  Wheelchair Mobility;Stairs;Ambulation PT Plan PT Intensity: Minimum of 1-2 x/day ,45 to 90 minutes PT Frequency: 5 out of 7 days PT Duration  Estimated Length of Stay: 10-12 days PT Treatment/Interventions: Ambulation/gait training;Balance/vestibular training;Cognitive remediation/compensation;Community reintegration;Discharge planning;Disease management/prevention;DME/adaptive equipment instruction;Functional mobility training;Neuromuscular re-education;Pain management;Patient/family education;Psychosocial support;Stair training;Therapeutic Activities;Therapeutic Exercise;UE/LE Strength taining/ROM;UE/LE Coordination activities PT Transfers Anticipated Outcome(s): supervision PT Locomotion Anticipated Outcome(s): supervision ambulatory PT Recommendation Follow Up Recommendations: Home health PT;24 hour supervision/assistance Patient destination: Home Equipment Recommended: To be determined  Skilled Therapeutic Intervention Skilled therapeutic intervention initiated after completion of evaluation. Discussed with patient and daughter focus of therapy during stay, possible length of stay, goals, and follow-up therapy. Patient fatigued upon arrival, requiring max encouragement to participate in evaluation, stating he did not sleep well and was tired from therapy on acute care 2 days ago. Patient sat edge of bed with supervision to don undergarments with sit <> stand with min A overall. Patient utilized urinal seated EOB with increased time and ambulated to sink to wash hands with min A. After returning to bed to rest, patient c/o nausea and returned to supine. RN notified and administered nausea medications. Patient provided prolonged rest break and required continued education regarding goals and purpose of PT but eventually agreeable to sitting up again. Performed stand pivot transfer to wheelchair with min A. Switched out 20 x 16 wheelchair for 18 x 18 wheelchair for improved sitting tolerance for OOB. Patient negotiated up/down 12 stairs using 2 rails with min A and ambulated 50 ft with HHA and min A overall in controlled environment. Patient  declined further ambulation due to fatigue. Patient left sitting in wheelchair, handoff to SLP.   PT Evaluation Precautions/Restrictions Precautions Precautions: Fall Restrictions Weight Bearing Restrictions: No General Chart Reviewed: Yes Family/Caregiver Present: Yes (daughter Lattie Haw at end of eval)  Pain Pain Assessment Pain Assessment: No/denies pain Pain Score: 3  Pain Type: Acute pain Pain Location: Head Pain Orientation: Right;Left Pain Descriptors / Indicators: Headache Pain Frequency: Intermittent Pain Onset: Gradual Patients Stated Pain Goal: 0 Pain Intervention(s): Medication (See eMAR) Home Living/Prior Functioning Home Living Available Help at Discharge: Family;Available 24 hours/day Type of Home: House Home Access: Stairs to enter [employer] of Steps: 4 Entrance Stairs-Rails: Left;Right Home Layout: One level  Lives With: Alone Prior Function Level of Independence: Independent with transfers;Independent with gait;Independent with basic ADLs;Independent with homemaking with ambulation  Able to Take Stairs?: Yes Driving: Yes Comments: mows his lawn, drives Vision/Perception  Perception Comments:  (WFL)  Cognition Overall Cognitive Status: Impaired/Different from baseline Arousal/Alertness: Lethargic Orientation Level: Oriented X4 Attention: Sustained Sustained Attention: Impaired Sustained Attention Impairment: Verbal basic;Functional basic Memory: Impaired Memory Impairment: Retrieval deficit;Decreased recall of new information Awareness: Impaired Awareness Impairment: Anticipatory impairment Problem Solving: Impaired Problem Solving Impairment: Functional complex Safety/Judgment: Appears intact Sensation Sensation Light Touch: Appears Intact Hot/Cold: Appears Intact Proprioception: Appears Intact Coordination Gross Motor Movements are Fluid and Coordinated: No Fine Motor Movements are Fluid and Coordinated: Yes Coordination and  Movement Description: Generalized weakness and deconditioning. Finger Nose Finger Test: [facility] Motor  Motor Motor: Abnormal postural alignment and control  Mobility Bed Mobility Bed Mobility: Sit to Supine;Supine to Sit Supine to Sit: With rails;HOB elevated;5: Supervision Sit to Supine: 5: Supervision Transfers Transfers: Yes Stand Pivot Transfers: 4: Min assist Locomotion  Ambulation Ambulation: Yes Ambulation/Gait Assistance: 4: Min assist Ambulation Distance (Feet): 50 Feet Assistive device: 1 person hand held assist Ambulation/Gait Assistance Details: patient declined further ambulation due to fatigue Gait Gait: Yes Gait Pattern: Impaired  Gait Pattern: Step-through pattern;Decreased stride length;Decreased trunk rotation;Trunk flexed Gait velocity: decreased Stairs / Additional Locomotion Stairs: Yes Stairs Assistance: 4: Min assist Stair Management Technique: Two rails;Step to pattern;Alternating pattern;Forwards Number of Stairs: 12 Height of Stairs: 3 (4 6", 12 3" steps) Wheelchair Mobility Wheelchair Mobility: No  Trunk/Postural Assessment  Cervical Assessment Cervical Assessment: Within Functional Limits Thoracic Assessment Thoracic Assessment: Within Functional Limits Lumbar Assessment Lumbar Assessment: Within Functional Limits Postural Control Postural Control: Deficits on evaluation Protective Responses: delayed/impaired  Balance Balance Balance Assessed: Yes Static Standing Balance Static Standing - Balance Support: During functional activity;No upper extremity supported Static Standing - Level of Assistance: 5: Stand by assistance Static Standing - Comment/# of Minutes: washing hands at sink Dynamic Standing Balance Dynamic Standing - Balance Support: During functional activity;Left upper extremity supported Dynamic Standing - Level of Assistance: 4: Min assist Extremity Assessment  RUE Assessment RUE Assessment: Within Functional Limits (MMT  4/5) LUE Assessment LUE Assessment: Within Functional Limits (MMT 4/5) RLE Assessment RLE Assessment: Within Functional Limits LLE Assessment LLE Assessment: Within Functional Limits   See Function Navigator for Current Functional Status.   Refer to Care Plan for Long Term Goals  Recommendations for other services: None  Discharge Criteria: Patient will be discharged from PT if patient refuses treatment 3 consecutive times without medical reason, if treatment goals not met, if there is a change in medical status, if patient makes no progress towards goals or if patient is discharged from hospital.  The above assessment, treatment plan, treatment alternatives and goals were discussed and mutually agreed upon: by patient  Laretta Alstrom 02/14/2016, 12:59 PM

## 2016-02-14 NOTE — Progress Notes (Signed)
Social Work  Social Work Assessment and Plan  Patient Details  Name: Jason Hawkins MRN: [id number] Date of Birth: 1928/01/25  Today's Date: 02/14/2016  Problem List:  Patient Active Problem List   Diagnosis Date Noted  . Benign essential HTN   . Leukocytosis   . Vascular headache   . SOB (shortness of breath)   . Acute hypoxemic respiratory failure ([place]) 02/04/2016  . Ruptured cerebral aneurysm ([place]) 02/01/2016  . Subarachnoid bleed ([place]) 01/31/2016  . Chronic atrial fibrillation ([place])   . Hypertensive urgency   . SAH (subarachnoid hemorrhage) ([place])   . 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 communicating artery aneurysm ([place])   . Well adult exam 01/04/2016  . H/O prostate cancer 04/30/2014  . Fatigue 04/30/2014  . PVD (peripheral vascular disease) with claudication ([place]) 12/21/2013  . Dyslipidemia 12/21/2013  . Elevated BP 05/12/2013  . Bee sting allergy 03/17/2013  . Chronic venous insufficiency 03/17/2013  . Herpes zoster 03/17/2013  . Depression 04/13/2011  . Long term current use of anticoagulant therapy 12/14/2010  . Atrial fibrillation ([place]) 12/27/2009  . Edema 12/27/2009  . S/P AVR 12/27/2009   Past Medical History:  Past Medical History  Diagnosis Date  . Chronic atrial fibrillation ([place])   . Severe aortic stenosis     s/p aortic valve replacement using a pericardial tissue valve  . HTN (hypertension)   . Lower extremity edema   . Hypercholesteremia    Past Surgical History:  Past Surgical History  Procedure Laterality Date  . Aortic valve replacement  08/30/2003    Dr. Cyndia Bent  . Radiology with anesthesia N/A 02/01/2016    Procedure: RADIOLOGY WITH ANESTHESIA;  Surgeon: Consuella Lose, MD;  Location: [facility];  Service: Radiology;  Laterality: N/A;   Social History:  reports that he has quit smoking. He does not have any smokeless tobacco history on file. He reports that he drinks about 8.4 oz of alcohol per week. He reports that he does not use illicit  drugs.  Family / Support Systems Marital Status: Widow/Widower How Long?: 5 yrs Patient Roles: Parent, Other (Comment) (grandparent) Children: son, Raynell Roloson @ (C) [phone] (local);  daughter, Angie Fava [facility]) @ (C) [phone];  son, Martin Majestic ([place]).  One son died a couple of months ago. Anticipated Caregiver: local son and his family (including teen-aged grandchildren) live "about 88 yards from my house".  Daughter is returning to [place] at the end of the week but can return as needed.  Local son and dtr-in-law to be primary support.s Ability/Limitations of Caregiver: Family will make sure that someone stays with patient after discharge Caregiver Availability: 24/7 (if needed) Family Dynamics: Daughter and son present during assessment interview and very involved, joking with pt and one another.  Pt reports a very good relationship with all.  Social History Preferred language: English Religion: Lutheran Cultural Background: NA Read: Yes Write: Yes Employment Status: Retired Date Retired/Disabled/Unemployed: 5 yrs Freight forwarder Issues: None Guardian/Conservator: Per MD, pt is capable of making decisions on his own behalf   Abuse/Neglect Physical Abuse: Denies Verbal Abuse: Denies Sexual Abuse: Denies Exploitation of patient/patient's resources: Denies Self-Neglect: Denies  Emotional Status Pt's affect, behavior adn adjustment status: Pt initially appeared reluctant to engage in interview, however, once he put in his hearing aids and we were able to joke with one another he began to talk more.  Present with dark sense of humor (which daughter states is his "normal") and expresses his eagerness to d/c home ASAP.  Admits much frustration  with his general weakness and continued HA.  He denies any significant emotional distress.  Feels very supported by family.  Will monitor and refer as needed for neuropsychology. Recent Psychosocial Issues: Son died a few  months ago. Pyschiatric History: None Substance Abuse History: None  Patient / Family Perceptions, Expectations & Goals Pt/Family understanding of illness & functional limitations: Pt and daughter with good understanding of the [facility] and aneurysm coiling performed.  Aware of current functional limitations/ need for CIR. Premorbid pt/family roles/activities: Pt was completely independent, driving and out daily in the community.  He "splits time" between home in [place] and [place] and calls himself a "snowbird". Anticipated changes in roles/activities/participation: Dependent on gains made. He may need to assistance initially and family willing to assume caregiver support roles. Pt/family expectations/goals: "I just want these headaches to go away and to go home."  [employer]: None Premorbid Home Care/DME Agencies: None Transportation available at discharge: yes Resource referrals recommended: Neuropsychology  Discharge Planning Living Arrangements: Alone Support Systems: Children, Friends/neighbors Type of Residence: Private residence Insurance Resources: [employer], Multimedia programmer (specify) (generic supplement) Financial Resources: Radio broadcast assistant Screen Referred: No Living Expenses: Own Money Management: Patient Does the patient have any problems obtaining your medications?: No Home Management: pt was independent with this Patient/Family Preliminary Plans: Pt to return to his own home with family helping as they are able and up to 24/7 if needed. Social Work Anticipated Follow Up Needs: HH/OP Expected length of stay: 10-12 days  Clinical Impression Pleasant gentleman with very dry sense of humor on CIR following aneurysm rupture with SAH.  Reports his primary frustration is his significant weakness and constant HA pain.  Son and daughter are bedside and all laughing easily with one another with daughter reporting the family will provide whatever level  of assist needed.  Pt hopeful his LOS will be very short.  No s/s of emotional distress reported or noted.  Will follow for support and d/c planning needs.  Yazhini Mcaulay 02/14/2016, 4:55 PM

## 2016-02-14 NOTE — Progress Notes (Signed)
Subjective/Complaints: 82 y.o. 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 He was found to have [place] and cerebral angiogram revealed 1.5 X 3 mm ACA aneurysm  INR 1.8 and coumadin reversed. BP elevated therefore he was placed on Cardene drip for BP control. He underwent coil embolization of ACA aneurysm on 02/01/16 by Dr. Kathyrn Sheriff. Post op reported to have CP due to fluid overload and improved with IV diuresis.  Admitted for rehabilitation yesterday. No new issues overnight.  Review of systems negative for chest pain, shortness breath, nausea, vomiting, constipation, diarrhea  Objective: Vital Signs: Blood pressure 166/78, pulse 71, temperature 98 F (36.7 C), temperature source Oral, resp. rate 18, height '5\' 9"'  (1.753 m), weight 88.4 kg (194 lb 14.2 oz), SpO2 95 %. No results found. Results for orders placed or performed during the hospital encounter of 02/13/16 (from the past 72 hour(s))  Comprehensive metabolic panel     Status: Abnormal   Collection Time: 02/14/16  6:39 AM  Result Value Ref Range   Sodium 132 (L) 135 - 145 mmol/L   Potassium 4.0 3.5 - 5.1 mmol/L   Chloride 100 (L) 101 - 111 mmol/L   CO2 25 22 - 32 mmol/L   Glucose, Bld 124 (H) 65 - 99 mg/dL   BUN 23 (H) 6 - 20 mg/dL   Creatinine, Ser 0.75 0.61 - 1.24 mg/dL   Calcium 8.8 (L) 8.9 - 10.3 mg/dL   Total Protein 6.2 (L) 6.5 - 8.1 g/dL   Albumin 3.2 (L) 3.5 - 5.0 g/dL   AST 23 15 - 41 U/L   ALT 53 17 - 63 U/L   Alkaline Phosphatase 65 38 - 126 U/L   Total Bilirubin 1.0 0.3 - 1.2 mg/dL   GFR calc non Af Amer >60 >60 mL/min   GFR calc Af Amer >60 >60 mL/min    Comment: (NOTE) The eGFR has been calculated using the CKD EPI equation. This calculation has not been validated in all clinical situations. eGFR's persistently <60 mL/min signify possible Chronic Kidney Disease.    Anion gap 7 5 - 15  CBC WITH DIFFERENTIAL     Status: Abnormal   Collection  Time: 02/14/16  6:39 AM  Result Value Ref Range   WBC 10.0 4.0 - 10.5 K/uL   RBC 4.00 (L) 4.22 - 5.81 MIL/uL   Hemoglobin 12.7 (L) 13.0 - 17.0 g/dL   HCT 37.4 (L) 39.0 - 52.0 %   MCV 93.5 78.0 - 100.0 fL   MCH 31.8 26.0 - 34.0 pg   MCHC 34.0 30.0 - 36.0 g/dL   RDW 13.0 11.5 - 15.5 %   Platelets 373 150 - 400 K/uL   Neutrophils Relative % 75 %   Neutro Abs 7.5 1.7 - 7.7 K/uL   Lymphocytes Relative 12 %   Lymphs Abs 1.2 0.7 - 4.0 K/uL   Monocytes Relative 10 %   Monocytes Absolute 1.0 0.1 - 1.0 K/uL   Eosinophils Relative 3 %   Eosinophils Absolute 0.3 0.0 - 0.7 K/uL   Basophils Relative 0 %   Basophils Absolute 0.0 0.0 - 0.1 K/uL     HEENT: normal Cardio: irregular and mechanical click Resp: CTA B/L and unlabored GI: BS positive and nontender nondistended Extremity:  No Edema Skin:   Intact Neuro: Alert/Oriented, Lethargic, Normal Sensory, Normal Motor and Abnormal FMC Ataxic/ dec FMC Musc/Skel:  Other mild pain with left knee range of motion, left leg  length discrepancy shorter than right Gen. no acute distress   Assessment/Plan: 1. Functional deficits secondary to subarachnoid hemorrhage secondary to ACA aneurysm rupture with decreased gait, decreased cognition, decreased fine motor which require 3+ hours per day of interdisciplinary therapy in a comprehensive inpatient rehab setting. Physiatrist is providing close team supervision and 24 hour management of active medical problems listed below. Physiatrist and rehab team continue to assess barriers to discharge/monitor patient progress toward functional and medical goals. FIM:                   Function - Comprehension Comprehension: Auditory Comprehension assistive device: Hearing aids Comprehension assist level: Follows complex conversation/direction with extra time/assistive device  Function - Expression Expression: Verbal Expression assist level: Expresses complex ideas: With extra time/assistive  device  Function - Social Interaction Social Interaction assist level: Interacts appropriately with others with medication or extra time (anti-anxiety, antidepressant).  Function - Problem Solving Problem solving assist level: Solves basic problems with no assist  Function - Memory Memory assist level: Recognizes or recalls 90% of the time/requires cueing < 10% of the time Patient normally able to recall (first 3 days only): Current season, Location of own room, Staff names and faces, That he or she is in a hospital   Medical Problem List and Plan: 1. Unsteady gait and LOB with activity secondary to [facility] and ACA aneurysm s/p coiling.patient will initiate CIR level PT OT today. Do not anticipate longer-term speech needs, would benefit from evaluation to assess cognition 2. S/p AVR/DVT Prophylaxis/Anticoagulation: coumadin d/c by NS. Now on Pharmaceutical: Other (comment)-Eliquis 3. Pain Management: Tylenol or ultram prn 4. Mood: LCSW to follow for evaluation and support.  5. Neuropsych: This patient is capable of making decisions on his own behalf. 6. Skin/Wound Care: Routine pressure relief measures.  7. Fluids/Electrolytes/Nutrition: monitor I/O. Check lytes in am. 8. A Fib: Monitor HR bid. On Eliquis and ASA.  9. Headaches: Monitor for now. May need scheduled medication if not controlled by prn. vascular headaches on Topamax 10 HTN: Monitor BP bid. Continue Norvasc daily.  11. Mild hyponatremia will monitor 12. Mild hyperglycemia, no treatment needed 13. Leukocytosis: resolved  LOS (Days) 1 A FACE TO FACE EVALUATION WAS PERFORMED  Jazzmyne Rasnick E 02/14/2016, 8:59 AM

## 2016-02-15 ENCOUNTER — Inpatient Hospital Stay (HOSPITAL_COMMUNITY): Payer: Medicare Other | Admitting: Physical Therapy

## 2016-02-15 ENCOUNTER — Inpatient Hospital Stay (HOSPITAL_COMMUNITY): Payer: Medicare Other | Admitting: Speech Pathology

## 2016-02-15 ENCOUNTER — Inpatient Hospital Stay (HOSPITAL_COMMUNITY): Payer: Medicare Other

## 2016-02-15 ENCOUNTER — Inpatient Hospital Stay (HOSPITAL_COMMUNITY): Payer: Medicare Other | Admitting: Occupational Therapy

## 2016-02-15 MED ORDER — TOPIRAMATE 25 MG PO TABS
50.0000 mg | ORAL_TABLET | Freq: Every day | ORAL | Status: DC
  Administered 2016-02-15: 50 mg via ORAL
  Filled 2016-02-15: qty 2

## 2016-02-15 NOTE — Progress Notes (Signed)
Physical Therapy Session Note  Patient Details  Name: Jason Hawkins MRN: [id number] Date of Birth: 06-Feb-1928  Today's Date: 02/15/2016 PT Individual Time: 1100-1130 PT Individual Time Calculation (min): 30 min   Short Term Goals: Week 1:  PT Short Term Goal 1 (Week 1): = LTGs due to anticipated LOS  Skilled Therapeutic Interventions/Progress Updates:    Pt asleep in w/c upon entering.  Pt very lethargic throughout session at times appearing to be falling asleep.  Pt stating feeling very sleeping all day finding it difficult to perform tasks.  Pt performed standing tolerance activities reaching for objects with HHA.  Pt able to maintain F+ balance. Multiple verbal cues required to keep eyes open and look at objects for activities.    Pt requesting seated rest after approx 1 min bouts.  Pt then c/o feeling nauseous and increased "stabbing" pain in head which decreased with rest.  BP checked  133/67.  Pt returned to room requesting to remain in w/c with call bell within reach.    Therapy Documentation Precautions:  Precautions Precautions: Fall Restrictions Weight Bearing Restrictions: No General:   Vital Signs: Therapy Vitals BP: 133/67 mmHg Patient Position (if appropriate): Sitting Pain:       See Function Navigator for Current Functional Status.   Therapy/Group: Individual Therapy  Lois Ostrom  Satoru Milich, PTA  02/15/2016, 12:54 PM

## 2016-02-15 NOTE — Patient Care Conference (Signed)
Inpatient RehabilitationTeam Conference and Plan of Care Update Date: 02/15/2016   Time: 11:45 AM    Patient Name: Jason Hawkins      Medical Record Number: [id number]  Date of Birth: 09/18/1927 Sex: Male         Room/Bed: 4W02C/4W02C-01 Payor Info: Payor: MEDICARE / Plan: MEDICARE PART A AND B / Product Type: *No Product type* /    Admitting Diagnosis: SAH - ACA ANEURYSM  Admit Date/Time:  02/13/2016  4:52 PM Admission Comments: No comment available   Primary Diagnosis:  SAH (subarachnoid hemorrhage) (HCC) Principal Problem: SAH (subarachnoid hemorrhage) ([place])  Patient Active Problem List   Diagnosis Date Noted  . Benign essential HTN   . Leukocytosis   . Vascular headache   . SOB (shortness of breath)   . Acute hypoxemic respiratory failure ([place]) 02/04/2016  . Ruptured cerebral aneurysm ([place]) 02/01/2016  . Subarachnoid bleed ([place]) 01/31/2016  . Chronic atrial fibrillation ([place])   . Hypertensive urgency   . SAH (subarachnoid hemorrhage) ([place])   . 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 Well adult exam 01/04/2016  . H/O prostate cancer 04/30/2014  . Fatigue 04/30/2014  . PVD (peripheral vascular disease) with claudication ([place]) 12/21/2013  . Dyslipidemia 12/21/2013  . Elevated BP 05/12/2013  . Bee sting allergy 03/17/2013  . Chronic venous insufficiency 03/17/2013  . Herpes zoster 03/17/2013  . Depression 04/13/2011  . Long term current use of anticoagulant therapy 12/14/2010  . Atrial fibrillation ([place]) 12/27/2009  . Edema 12/27/2009  . S/P AVR 12/27/2009    Expected Discharge Date: Expected Discharge Date: 02/25/16  Team Members Present: Physician leading conference: Dr. Alysia Penna Social Worker Present: Lennart Pall, LCSW Nurse Present: Junius Creamer, RN PT Present: Carney Living, Dustin Folks, PT OT Present: Roanna Epley, [place], OT SLP Present: Weston Anna, SLP PPS Coordinator present : Daiva Nakayama,  RN, CRRN     Current Status/Progress Goal Weekly Team Focus  Medical   Fatigue and lethargy, ? sleep  ModI/Sup goals  improve endurance   Bowel/Bladder   Continent bowel and bladder, LBM 7/7  Remain continent B/B  Offer PRN laxatives    Swallow/Nutrition/ Hydration             ADL's   Mod A UB dressing for buttoning shirt; LB dressing at [place] with current Max A for socks due to generalized fatigue   Mod I dressing overall   Improved endurance and fine motor skills    Mobility   min A up to 50 ft without device  supervision  standing balance, balance strategies, endurance, functional mobility training, pt/family education   Communication             Safety/Cognition/ Behavioral Observations            Pain   occassional headache, scheduled topamax and PRN tramadol between 5-6 out of 10  less than 3 out of 10  Offer PRN meds    Skin   MASD groin/botton-MGP, brusing Lt flamk  no new skin breakdown  Assess q shift    Rehab Goals Patient on target to meet rehab goals: Yes *See Care Plan and progress notes for long and short-term goals.  Barriers to Discharge: see above    Possible Resolutions to Barriers:  eval sleep and meds    Discharge Planning/Teaching Needs:  Plan to d/c home with family providing 24/7 assist initially.  Teaching to be scheduled   Team Discussion:  Still with ongoing HA.  Extremely fatigued which made evaluations difficult. Hoping to reach supervision/ mod ind goals.  Need to continue to monitor sleep - wake cycle.  Team does anticipate that family will need to initially provide 24/7 assistance  Revisions to Treatment Plan:  None   Continued Need for Acute Rehabilitation Level of Care: The patient requires daily medical management by a physician with specialized training in physical medicine and rehabilitation for the following conditions: Daily direction of a multidisciplinary physical rehabilitation program to ensure safe treatment while eliciting  the highest outcome that is of practical value to the patient.: Yes Daily analysis of laboratory values and/or radiology reports with any subsequent need for medication adjustment of medical intervention for : Neurological problems  Thaniel Coluccio 02/15/2016, 1:45 PM

## 2016-02-15 NOTE — Progress Notes (Signed)
Subjective/Complaints: Still has headache, complains of not enough salt in diet   Review of systems negative for chest pain, shortness breath, nausea, vomiting, constipation, diarrhea  Objective: Vital Signs: Blood pressure 132/64, pulse 67, temperature 98 F (36.7 C), temperature source Oral, resp. rate 16, height _0  (1.753 m), weight 88.5 kg (195 lb 1.7 oz), SpO2 94 %. No results found. Results for orders placed or performed during the hospital encounter of 02/13/16 (from the past 72 hour(s))  Comprehensive metabolic panel     Status: Abnormal   Collection Time: 02/14/16  6:39 AM  Result Value Ref Range   Sodium 132 (L) 135 - 145 mmol/L   Potassium 4.0 3.5 - 5.1 mmol/L   Chloride 100 (L) 101 - 111 mmol/L   CO2 25 22 - 32 mmol/L   Glucose, Bld 124 (H) 65 - 99 mg/dL   BUN 23 (H) 6 - 20 mg/dL   Creatinine, Ser 0.75 0.61 - 1.24 mg/dL   Calcium 8.8 (L) 8.9 - 10.3 mg/dL   Total Protein 6.2 (L) 6.5 - 8.1 g/dL   Albumin 3.2 (L) 3.5 - 5.0 g/dL   AST 23 15 - 41 U/L   ALT 53 17 - 63 U/L   Alkaline Phosphatase 65 38 - 126 U/L   Total Bilirubin 1.0 0.3 - 1.2 mg/dL   GFR calc non Af Amer >60 >60 mL/min   GFR calc Af Amer >60 >60 mL/min    Comment: (NOTE) The eGFR has been calculated using the CKD EPI equation. This calculation has not been validated in all clinical situations. eGFR's persistently <60 mL/min signify possible Chronic Kidney Disease.    Anion gap 7 5 - 15  CBC WITH DIFFERENTIAL     Status: Abnormal   Collection Time: 02/14/16  6:39 AM  Result Value Ref Range   WBC 10.0 4.0 - 10.5 K/uL   RBC 4.00 (L) 4.22 - 5.81 MIL/uL   Hemoglobin 12.7 (L) 13.0 - 17.0 g/dL   HCT 37.4 (L) 39.0 - 52.0 %   MCV 93.5 78.0 - 100.0 fL   MCH 31.8 26.0 - 34.0 pg   MCHC 34.0 30.0 - 36.0 g/dL   RDW 13.0 11.5 - 15.5 %   Platelets 373 150 - 400 K/uL   Neutrophils Relative % 75 %   Neutro Abs 7.5 1.7 - 7.7 K/uL   Lymphocytes Relative 12 %   Lymphs Abs 1.2 0.7 - 4.0 K/uL   Monocytes  Relative 10 %   Monocytes Absolute 1.0 0.1 - 1.0 K/uL   Eosinophils Relative 3 %   Eosinophils Absolute 0.3 0.0 - 0.7 K/uL   Basophils Relative 0 %   Basophils Absolute 0.0 0.0 - 0.1 K/uL     HEENT: normal Cardio: irregular and mechanical click Resp: CTA B/L and unlabored GI: BS positive and nontender nondistended Extremity:  No Edema Skin:   Intact Neuro: Alert/Oriented, Lethargic, Normal Sensory, Normal Motor and Abnormal FMC Ataxic/ dec FMC Musc/Skel:  Other mild pain with left knee range of motion, left leg length discrepancy shorter than right Gen. no acute distress   Assessment/Plan: 1. Functional deficits secondary to subarachnoid hemorrhage secondary to ACA aneurysm rupture with decreased gait, decreased cognition, decreased fine motor which require 3+ hours per day of interdisciplinary therapy in a comprehensive inpatient rehab setting. Physiatrist is providing close team supervision and 24 hour management of active medical problems listed below. Physiatrist and rehab team continue to assess barriers to discharge/monitor patient progress toward functional and medical  goals. FIM: Function - Bathing Position: Shower Body parts bathed by patient: Right arm, Left arm, Chest, Abdomen, Front perineal area, Buttocks, Right upper leg, Right lower leg, Left upper leg, Left lower leg Body parts bathed by helper: Back Assist Level: Touching or steadying assistance(Pt > 75%)  Function- Upper Body Dressing/Undressing What is the patient wearing?: Button up shirt Button up shirt - Perfomed by patient: Thread/unthread right sleeve, Thread/unthread left sleeve Button up shirt - Perfomed by helper: Pull shirt around back, Button/unbutton shirt Assist Level:  (Mod A) Function - Lower Body Dressing/Undressing What is the patient wearing?: Underwear, Pants, Non-skid slipper socks Position: Other (comment) (Seated on BSC in bathroom. ) Underwear - Performed by patient: Thread/unthread right  underwear leg, Thread/unthread left underwear leg, Pull underwear up/down Pants- Performed by patient: Thread/unthread right pants leg, Thread/unthread left pants leg, Pull pants up/down, Fasten/unfasten pants Non-skid slipper socks- Performed by helper: Don/doff right sock, Don/doff left sock Assist for footwear: Maximal assist Assist for lower body dressing: Touching or steadying assistance (Pt > 75%)  Function - Toileting Toileting steps completed by patient: Adjust clothing prior to toileting, Performs perineal hygiene, Adjust clothing after toileting Assist level: Supervision or verbal cues     Function - Chair/bed transfer Chair/bed transfer method: Stand pivot Chair/bed transfer assist level: Touching or steadying assistance (Pt > 75%)  Function - Locomotion: Wheelchair Will patient use wheelchair at discharge?: No Type: Manual Assist Level: Dependent (Pt equals 0%) Function - Locomotion: Ambulation Assistive device: Hand held assist Max distance: 50 ft Assist level: Touching or steadying assistance (Pt > 75%) Assist level: Touching or steadying assistance (Pt > 75%) Assist level: Touching or steadying assistance (Pt > 75%)  Function - Comprehension Comprehension: Auditory Comprehension assistive device: Hearing aids Comprehension assist level: Follows basic conversation/direction with extra time/assistive device  Function - Expression Expression: Verbal Expression assist level: Expresses basic needs/ideas: With extra time/assistive device  Function - Social Interaction Social Interaction assist level: Interacts appropriately 90% of the time - Needs monitoring or encouragement for participation or interaction.  Function - Problem Solving Problem solving assist level: Solves basic problems with no assist  Function - Memory Memory assist level: Recognizes or recalls 90% of the time/requires cueing < 10% of the time Patient normally able to recall (first 3 days only):  Current season, That he or she is in a hospital   Medical Problem List and Plan: 1. Unsteady gait and LOB with activity secondary to [facility] and ACA aneurysm s/p coiling.patient Team conference today please see physician documentation under team conference tab, met with team face-to-face to discuss problems,progress, and goals. Formulized individual treatment plan based on medical history, underlying problem and comorbidities. 2. S/p AVR/DVT Prophylaxis/Anticoagulation: coumadin d/c by NS. Now on Pharmaceutical: Other (comment)-Eliquis 3. Pain Management: Tylenol or ultram prn 4. Mood: LCSW to follow for evaluation and support.  5. Neuropsych: This patient is capable of making decisions on his own behalf. 6. Skin/Wound Care: Routine pressure relief measures.  7. Fluids/Electrolytes/Nutrition: monitor I/O. Check lytes in am. 8. A Fib: Monitor HR bid. On Eliquis and ASA.  9. Headaches: Monitor for now. May need scheduled medication if not controlled by prn. vascular headaches on Topamax, will increase dose 10 HTN: Monitor BP bid. Continue Norvasc daily.  11. Mild hyponatremia will liberalize Na in diet 12. Mild hyperglycemia, no treatment needed   LOS (Days) 2 A FACE TO FACE EVALUATION WAS PERFORMED  Nyra Anspaugh E 02/15/2016, 9:46 AM

## 2016-02-15 NOTE — Progress Notes (Signed)
Occupational Therapy Session Note  Patient Details  Name: Jeanmarc Garciamartinez MRN: [id number] Date of Birth: 1928/03/14  Today's Date: 02/15/2016 OT Individual Time: 0930-1030 OT Individual Time Calculation (min): 60 min    Short Term Goals: Week 1:  OT Short Term Goal 1 (Week 1): Pt will complete UB dressing with Min A.  OT Short Term Goal 2 (Week 1): Pt will tolerate standing for 4 minutes to complete grooming tasks at sink.  OT Short Term Goal 3 (Week 1): Pt will complete tub/shower transfers with supervision.  OT Short Term Goal 4 (Week 1): Pt will complete LB dressing with supervision.   Skilled Therapeutic Interventions/Progress Updates:    Pt resting in bed upon arrival.  Pt stated he was extremely tired/exhausted and was experiencing nausea.  Pt declined shower/bathing this morning but agreeable to dressing.  Pt requested use of toilet and amb with RW to bathroom.  Pt returned to w/c at sink and stood to perform periarea hygiene with wash cloth.  Pt completed dressing tasks with steady A when standing.  Pt required more than a reasonable amount of time to complete tasks with multiple rest breaks.  Focus on activity tolerance, sit<>stand, standing balance, functional amb with RW, and safety awareness to increase independence with BADLs.   Therapy Documentation Precautions:  Precautions Precautions: Fall Restrictions Weight Bearing Restrictions: No Pain:  Pt c/o ongoing nausea and headache; RN aware  See Function Navigator for Current Functional Status.   Therapy/Group: Individual Therapy  Leroy Libman 02/15/2016, 3:12 PM

## 2016-02-15 NOTE — Progress Notes (Signed)
Social Work Patient ID: Jason Hawkins, male   DOB: 03-14-1928, 82 y.o.   MRN: [id number]   Lowella Curb, LCSW Social Worker Signed  Patient Care Conference 02/15/2016  1:45 PM    Expand All Collapse All   Inpatient RehabilitationTeam Conference and Plan of Care Update Date: 02/15/2016   Time: 11:45 AM     Patient Name: Jason Hawkins       Medical Record Number: [id number]  Date of Birth: May 23, 1928 Sex: Male         Room/Bed: 4W02C/4W02C-01 Payor Info: Payor: MEDICARE / Plan: MEDICARE PART A AND B / Product Type: *No Product type* /    Admitting Diagnosis: SAH - ACA ANEURYSM   Admit Date/Time:  02/13/2016  4:52 PM Admission Comments: No comment available   Primary Diagnosis:  SAH (subarachnoid hemorrhage) (HCC) Principal Problem: SAH (subarachnoid hemorrhage) ([place])    Patient Active Problem List     Diagnosis  Date Noted   .  Benign essential HTN     .  Leukocytosis     .  Vascular headache     .  SOB (shortness of breath)     .  Acute hypoxemic respiratory failure ([place])  02/04/2016   .  Ruptured cerebral aneurysm ([place])  02/01/2016   .  Subarachnoid bleed ([place])  01/31/2016   .  Chronic atrial fibrillation ([place])     .  Hypertensive urgency     .  SAH (subarachnoid hemorrhage) ([place])     .  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  Well adult exam  01/04/2016   .  H/O prostate cancer  04/30/2014   .  Fatigue  04/30/2014   .  PVD (peripheral vascular disease) with claudication ([place])  12/21/2013   .  Dyslipidemia  12/21/2013   .  Elevated BP  05/12/2013   .  Bee sting allergy  03/17/2013   .  Chronic venous insufficiency  03/17/2013   .  Herpes zoster  03/17/2013   .  Depression  04/13/2011   .  Long term current use of anticoagulant therapy  12/14/2010   .  Atrial fibrillation ([place])  12/27/2009   .  Edema  12/27/2009   .  S/P AVR  12/27/2009     Expected Discharge Date: Expected Discharge Date: 02/25/16  Team Members  Present: Physician leading conference: Dr. Alysia Penna Social Worker Present: Lennart Pall, LCSW Nurse Present: Junius Creamer, RN PT Present: Carney Living, Dustin Folks, PT OT Present: Roanna Epley, [place], OT SLP Present: Weston Anna, SLP PPS Coordinator present : Daiva Nakayama, RN, CRRN        Current Status/Progress  Goal  Weekly Team Focus   Medical     Fatigue and lethargy, ? sleep  ModI/Sup goals  improve endurance   Bowel/Bladder     Continent bowel and bladder, LBM 7/7   Remain continent B/B  Offer PRN laxatives    Swallow/Nutrition/ Hydration               ADL's     Mod A UB dressing for buttoning shirt; LB dressing at [place] with current Max A for socks due to generalized fatigue    Mod I dressing overall   Improved endurance and fine motor skills    Mobility     min A up to 50 ft without device  supervision  standing balance, balance strategies, endurance, functional mobility training, pt/family education   Communication  Safety/Cognition/ Behavioral Observations              Pain     occassional headache, scheduled topamax and PRN tramadol between 5-6 out of 10  less than 3 out of 10  Offer PRN meds    Skin     MASD groin/botton-MGP, brusing Lt flamk   no new skin breakdown  Assess q shift    Rehab Goals Patient on target to meet rehab goals: Yes *See Care Plan and progress notes for long and short-term goals.    Barriers to Discharge:  see above     Possible Resolutions to Barriers:   eval sleep and meds     Discharge Planning/Teaching Needs:   Plan to d/c home with family providing 24/7 assist initially.   Teaching to be scheduled    Team Discussion:    Still with ongoing HA.  Extremely fatigued which made evaluations difficult. Hoping to reach supervision/ mod ind goals.  Need to continue to monitor sleep - wake cycle.  Team does anticipate that family will need to initially provide 24/7 assistance   Revisions to  Treatment Plan:    None    Continued Need for Acute Rehabilitation Level of Care: The patient requires daily medical management by a physician with specialized training in physical medicine and rehabilitation for the following conditions: Daily direction of a multidisciplinary physical rehabilitation program to ensure safe treatment while eliciting the highest outcome that is of practical value to the patient.: Yes Daily analysis of laboratory values and/or radiology reports with any subsequent need for medication adjustment of medical intervention for : Neurological problems  Kista Robb 02/15/2016, 1:45 PM

## 2016-02-15 NOTE — Discharge Instructions (Signed)
Information on my medicine - ELIQUIS® (apixaban) ° °This medication education was reviewed with me or my healthcare representative as part of my discharge preparation. ° °Why was Eliquis® prescribed for you? °Eliquis® was prescribed for you to reduce the risk of a blood clot forming that can cause a stroke if you have a medical condition called atrial fibrillation (a type of irregular heartbeat). ° °What do You need to know about Eliquis® ? °Take your Eliquis® 2.5 mg TWICE DAILY - one tablet in the morning and one tablet in the evening with or without food. If you have difficulty swallowing the tablet whole please discuss with your pharmacist how to take the medication safely. ° °Take Eliquis® exactly as prescribed by your doctor and DO NOT stop taking Eliquis® without talking to the doctor who prescribed the medication.  Stopping may increase your risk of developing a stroke.  Refill your prescription before you run out. ° °After discharge, you should have regular check-up appointments with your healthcare provider that is prescribing your Eliquis®.  In the future your dose may need to be changed if your kidney function or weight changes by a significant amount or as you get older. ° °What do you do if you miss a dose? °If you miss a dose, take it as soon as you remember on the same day and resume taking twice daily.  Do not take more than one dose of ELIQUIS at the same time to make up a missed dose. ° °Important Safety Information °A possible side effect of Eliquis® is bleeding. You should call your healthcare provider right away if you experience any of the following: °? Bleeding from an injury or your nose that does not stop. °? Unusual colored urine (red or dark brown) or unusual colored stools (red or black). °? Unusual bruising for unknown reasons. °? A serious fall or if you hit your head (even if there is no bleeding). ° °Some medicines may interact with Eliquis® and might increase your risk of bleeding or  clotting while on Eliquis®. To help avoid this, consult your healthcare provider or pharmacist prior to using any new prescription or non-prescription medications, including herbals, vitamins, non-steroidal anti-inflammatory drugs (NSAIDs) and supplements. ° °This website has more information on Eliquis® (apixaban): http://www.eliquis.com/eliquis/home ° °

## 2016-02-15 NOTE — Progress Notes (Signed)
Speech Language Pathology Daily Session Note  Patient Details  Name: Jason Hawkins MRN: [id number] Date of Birth: 05-01-1928  Today's Date: 02/15/2016 SLP Individual Time: 0830-0930 SLP Individual Time Calculation (min): 60 min  Short Term Goals: Week 1: SLP Short Term Goal 1 (Week 1): Pt will sustian attention to basic self-care tasks for 2-3 minutes with Min verbal cues for redirection.   SLP Short Term Goal 2 (Week 1): Pt will utilize word retreival strategies during moments of anomia in expression of daily information, needs, and wants with Min verbal cues. SLP Short Term Goal 3 (Week 1): Patient will utilize external aids to assist with reall of daily and new information with Supervision level verbal cues. SLP Short Term Goal 4 (Week 1): Patient will solve basic problems with Supervision level verbal cues.  Skilled Therapeutic Interventions: Skilled treatment session focused on cognitive goals. Upon arrival, patient was asleep while upright in bed but was easily awakened. Patient reported pain and nausea, RN made aware. Patient utilized schedule to anticipate upcoming therapy sessions with Min A question cues and extra time. RN administered medications and patient asked appropriate questions in regards to names and functions of medication. Patient demonstrated sustained attention to task for ~30-60 seconds to a functional conversation and was Mod I for word-finding. Patient left upright in bed with all needs within reach. Continue with current plan of care.    Function:   Cognition Comprehension Comprehension assist level: Follows basic conversation/direction with extra time/assistive device  Expression   Expression assist level: Expresses basic needs/ideas: With extra time/assistive device  Social Interaction Social Interaction assist level: Interacts appropriately 90% of the time - Needs monitoring or encouragement for participation or interaction.  Problem Solving Problem solving  assist level: Solves basic 75 - 89% of the time/requires cueing 10 - 24% of the time  Memory Memory assist level: Recognizes or recalls 75 - 89% of the time/requires cueing 10 - 24% of the time    Pain 4/10 pain in head, RN made aware.   Therapy/Group: Individual Therapy  Paymon Rosensteel 02/15/2016, 3:43 PM

## 2016-02-15 NOTE — Progress Notes (Signed)
Occupational Therapy Session Note  Patient Details  Name: Daken Ramey MRN: [id number] Date of Birth: 09-Feb-1928  Today's Date: 02/15/2016 OT Individual Time: [id number] OT Individual Time Calculation (min): 44 min    Short Term Goals: Week 1:  OT Short Term Goal 1 (Week 1): Pt will complete UB dressing with Min A.  OT Short Term Goal 2 (Week 1): Pt will tolerate standing for 4 minutes to complete grooming tasks at sink.  OT Short Term Goal 3 (Week 1): Pt will complete tub/shower transfers with supervision.  OT Short Term Goal 4 (Week 1): Pt will complete LB dressing with supervision.   Skilled Therapeutic Interventions/Progress Updates:    1:1 When arrived pt discussing how he was displeased with his lunch.  Pt recently upgraded to a regular diet. Transitioned down to the ADL apartment. Engaged in boiling an egg in the kitchen. Focus on maintaining high arousal level, functional problem solving with questioning cues, dynamic standing balance and endurance, functional ambulation, selective attention with supervision. Pt able to indicate need to void; ambulated with steadying  assist to bathroom and able to perform toileting with distant supervision. Return to kitchen and complete simple meal prep task.  Returned to room and left with family member in the room.   Therapy Documentation Precautions:  Precautions Precautions: Fall Restrictions Weight Bearing Restrictions: No Pain:  no c/o pain in session   See Function Navigator for Current Functional Status.   Therapy/Group: Individual Therapy  Willeen Cass [facility] 02/15/2016, 8:22 PM

## 2016-02-15 NOTE — Progress Notes (Signed)
Social Work Patient ID: Jason Hawkins, male   DOB: Aug 23, 1927, 82 y.o.   MRN: [id number]  Have reviewed team conference with pt and son. Both aware and agreeable with targeted d/c date of 7/22 and supervision goals.  Pt agrees that he is very weak and understands his need for "longer than wanted" LOS.  Will follow up with him upon my return next week.  Clemencia Helzer, LCSW

## 2016-02-16 ENCOUNTER — Inpatient Hospital Stay (HOSPITAL_COMMUNITY)
Admission: RE | Admit: 2016-02-16 | Discharge: 2016-02-20 | DRG: 247 | Disposition: A | Discharge status: Home / Self Care | Payer: Medicare Other | Source: Ambulatory Visit | Attending: Cardiovascular Disease | Admitting: Cardiovascular Disease

## 2016-02-16 ENCOUNTER — Encounter (HOSPITAL_COMMUNITY)
Admission: RE | Disposition: A | Discharge status: Home / Self Care | Payer: Self-pay | Source: Ambulatory Visit | Attending: Cardiovascular Disease

## 2016-02-16 ENCOUNTER — Inpatient Hospital Stay (HOSPITAL_COMMUNITY): Payer: Medicare Other

## 2016-02-16 ENCOUNTER — Inpatient Hospital Stay (HOSPITAL_COMMUNITY): Payer: Medicare Other | Admitting: Speech Pathology

## 2016-02-16 ENCOUNTER — Inpatient Hospital Stay (HOSPITAL_COMMUNITY): Payer: Medicare Other | Admitting: Physical Therapy

## 2016-02-16 DIAGNOSIS — E871 Hypo-osmolality and hyponatremia: Secondary | ICD-10-CM

## 2016-02-16 DIAGNOSIS — Z952 Presence of prosthetic heart valve: Secondary | ICD-10-CM

## 2016-02-16 DIAGNOSIS — Z79899 Other long term (current) drug therapy: Secondary | ICD-10-CM | POA: Diagnosis not present

## 2016-02-16 DIAGNOSIS — I2510 Atherosclerotic heart disease of native coronary artery without angina pectoris: Secondary | ICD-10-CM | POA: Diagnosis not present

## 2016-02-16 DIAGNOSIS — I221 Subsequent ST elevation (STEMI) myocardial infarction of inferior wall: Secondary | ICD-10-CM | POA: Diagnosis present

## 2016-02-16 DIAGNOSIS — I480 Paroxysmal atrial fibrillation: Secondary | ICD-10-CM | POA: Diagnosis not present

## 2016-02-16 DIAGNOSIS — I959 Hypotension, unspecified: Secondary | ICD-10-CM | POA: Diagnosis present

## 2016-02-16 DIAGNOSIS — I69398 Other sequelae of cerebral infarction: Secondary | ICD-10-CM

## 2016-02-16 DIAGNOSIS — D62 Acute posthemorrhagic anemia: Secondary | ICD-10-CM | POA: Insufficient documentation

## 2016-02-16 DIAGNOSIS — Z91030 Bee allergy status: Secondary | ICD-10-CM | POA: Diagnosis not present

## 2016-02-16 DIAGNOSIS — D72829 Elevated white blood cell count, unspecified: Secondary | ICD-10-CM | POA: Diagnosis not present

## 2016-02-16 DIAGNOSIS — N390 Urinary tract infection, site not specified: Secondary | ICD-10-CM | POA: Diagnosis present

## 2016-02-16 DIAGNOSIS — I2121 ST elevation (STEMI) myocardial infarction involving left circumflex coronary artery: Principal | ICD-10-CM | POA: Diagnosis present

## 2016-02-16 DIAGNOSIS — I442 Atrioventricular block, complete: Secondary | ICD-10-CM | POA: Diagnosis not present

## 2016-02-16 DIAGNOSIS — I498 Other specified cardiac arrhythmias: Secondary | ICD-10-CM

## 2016-02-16 DIAGNOSIS — B952 Enterococcus as the cause of diseases classified elsewhere: Secondary | ICD-10-CM | POA: Diagnosis present

## 2016-02-16 DIAGNOSIS — Z955 Presence of coronary angioplasty implant and graft: Secondary | ICD-10-CM

## 2016-02-16 DIAGNOSIS — R2689 Other abnormalities of gait and mobility: Secondary | ICD-10-CM | POA: Diagnosis not present

## 2016-02-16 DIAGNOSIS — I213 ST elevation (STEMI) myocardial infarction of unspecified site: Secondary | ICD-10-CM | POA: Diagnosis not present

## 2016-02-16 DIAGNOSIS — R001 Bradycardia, unspecified: Secondary | ICD-10-CM | POA: Diagnosis present

## 2016-02-16 DIAGNOSIS — I10 Essential (primary) hypertension: Secondary | ICD-10-CM | POA: Diagnosis present

## 2016-02-16 DIAGNOSIS — E785 Hyperlipidemia, unspecified: Secondary | ICD-10-CM | POA: Diagnosis present

## 2016-02-16 DIAGNOSIS — I602 Nontraumatic subarachnoid hemorrhage from anterior communicating artery: Secondary | ICD-10-CM | POA: Diagnosis present

## 2016-02-16 DIAGNOSIS — R739 Hyperglycemia, unspecified: Secondary | ICD-10-CM | POA: Diagnosis not present

## 2016-02-16 DIAGNOSIS — Z954 Presence of other heart-valve replacement: Secondary | ICD-10-CM

## 2016-02-16 DIAGNOSIS — I482 Chronic atrial fibrillation: Secondary | ICD-10-CM | POA: Diagnosis not present

## 2016-02-16 DIAGNOSIS — Z885 Allergy status to narcotic agent status: Secondary | ICD-10-CM | POA: Diagnosis not present

## 2016-02-16 DIAGNOSIS — R269 Unspecified abnormalities of gait and mobility: Secondary | ICD-10-CM

## 2016-02-16 DIAGNOSIS — I2119 ST elevation (STEMI) myocardial infarction involving other coronary artery of inferior wall: Secondary | ICD-10-CM | POA: Insufficient documentation

## 2016-02-16 DIAGNOSIS — I69319 Unspecified symptoms and signs involving cognitive functions following cerebral infarction: Secondary | ICD-10-CM | POA: Diagnosis not present

## 2016-02-16 DIAGNOSIS — Z7901 Long term (current) use of anticoagulants: Secondary | ICD-10-CM | POA: Diagnosis not present

## 2016-02-16 DIAGNOSIS — Z953 Presence of xenogenic heart valve: Secondary | ICD-10-CM

## 2016-02-16 DIAGNOSIS — E46 Unspecified protein-calorie malnutrition: Secondary | ICD-10-CM | POA: Diagnosis not present

## 2016-02-16 DIAGNOSIS — B9620 Unspecified Escherichia coli [E. coli] as the cause of diseases classified elsewhere: Secondary | ICD-10-CM | POA: Insufficient documentation

## 2016-02-16 DIAGNOSIS — R4189 Other symptoms and signs involving cognitive functions and awareness: Secondary | ICD-10-CM | POA: Diagnosis not present

## 2016-02-16 DIAGNOSIS — R51 Headache: Secondary | ICD-10-CM | POA: Diagnosis not present

## 2016-02-16 DIAGNOSIS — R2681 Unsteadiness on feet: Secondary | ICD-10-CM | POA: Diagnosis not present

## 2016-02-16 DIAGNOSIS — Z87891 Personal history of nicotine dependence: Secondary | ICD-10-CM

## 2016-02-16 DIAGNOSIS — E7800 Pure hypercholesterolemia, unspecified: Secondary | ICD-10-CM | POA: Diagnosis not present

## 2016-02-16 HISTORY — PX: CARDIAC CATHETERIZATION: SHX172

## 2016-02-16 LAB — CBC
HCT: 35.6 % — ABNORMAL LOW (ref 39.0–52.0)
HEMOGLOBIN: 12 g/dL — AB (ref 13.0–17.0)
MCH: 31.5 pg (ref 26.0–34.0)
MCHC: 33.7 g/dL (ref 30.0–36.0)
MCV: 93.4 fL (ref 78.0–100.0)
PLATELETS: 411 10*3/uL — AB (ref 150–400)
RBC: 3.81 MIL/uL — AB (ref 4.22–5.81)
RDW: 12.9 % (ref 11.5–15.5)
WBC: 13.3 10*3/uL — AB (ref 4.0–10.5)

## 2016-02-16 LAB — TROPONIN I
Troponin I: 0.03 ng/mL (ref <0.03)
Troponin I: 10.64 ng/mL (ref <0.03)

## 2016-02-16 LAB — COMPREHENSIVE METABOLIC PANEL
ALBUMIN: 3 g/dL — AB (ref 3.5–5.0)
ALK PHOS: 61 U/L (ref 38–126)
ALT: 32 U/L (ref 17–63)
ANION GAP: 7 (ref 5–15)
AST: 17 U/L (ref 15–41)
BUN: 20 mg/dL (ref 6–20)
CALCIUM: 8.6 mg/dL — AB (ref 8.9–10.3)
CO2: 21 mmol/L — AB (ref 22–32)
Chloride: 103 mmol/L (ref 101–111)
Creatinine, Ser: 0.91 mg/dL (ref 0.61–1.24)
GFR calc Af Amer: >60 mL/min (ref >60)
GFR calc non Af Amer: >60 mL/min (ref >60)
GLUCOSE: 141 mg/dL — AB (ref 65–99)
Potassium: 4.2 mmol/L (ref 3.5–5.1)
SODIUM: 131 mmol/L — AB (ref 135–145)
Total Bilirubin: 0.8 mg/dL (ref 0.3–1.2)
Total Protein: 5.8 g/dL — ABNORMAL LOW (ref 6.5–8.1)

## 2016-02-16 LAB — DIFFERENTIAL
BASOS ABS: 0 10*3/uL (ref 0.0–0.1)
Basophils Relative: 0 %
EOS PCT: 1 %
Eosinophils Absolute: 0.1 10*3/uL (ref 0.0–0.7)
LYMPHS ABS: 1.4 10*3/uL (ref 0.7–4.0)
LYMPHS PCT: 11 %
Monocytes Absolute: 0.9 10*3/uL (ref 0.1–1.0)
Monocytes Relative: 7 %
NEUTROS ABS: 10.8 10*3/uL — AB (ref 1.7–7.7)
NEUTROS PCT: 81 %

## 2016-02-16 LAB — LIPID PANEL
Cholesterol: 157 mg/dL (ref 0–200)
HDL: 46 mg/dL (ref >40)
LDL CALC: 100 mg/dL — AB (ref 0–99)
TRIGLYCERIDES: 57 mg/dL (ref <150)
Total CHOL/HDL Ratio: 3.4 RATIO
VLDL: 11 mg/dL (ref 0–40)

## 2016-02-16 LAB — URINALYSIS, ROUTINE W REFLEX MICROSCOPIC
Bilirubin Urine: NEGATIVE
GLUCOSE, UA: NEGATIVE mg/dL
Ketones, ur: NEGATIVE mg/dL
Nitrite: NEGATIVE
PH: 7.5 (ref 5.0–8.0)
Protein, ur: NEGATIVE mg/dL
SPECIFIC GRAVITY, URINE: 1.024 (ref 1.005–1.030)

## 2016-02-16 LAB — GLUCOSE, CAPILLARY
GLUCOSE-CAPILLARY: 133 mg/dL — AB (ref 65–99)
Glucose-Capillary: 121 mg/dL — ABNORMAL HIGH (ref 65–99)

## 2016-02-16 LAB — POCT I-STAT, CHEM 8
BUN: 22 mg/dL — ABNORMAL HIGH (ref 6–20)
CALCIUM ION: 1.23 mmol/L (ref 1.12–1.23)
Chloride: 103 mmol/L (ref 101–111)
Creatinine, Ser: 0.8 mg/dL (ref 0.61–1.24)
GLUCOSE: 145 mg/dL — AB (ref 65–99)
HCT: 36 % — ABNORMAL LOW (ref 39.0–52.0)
HEMOGLOBIN: 12.2 g/dL — AB (ref 13.0–17.0)
POTASSIUM: 4.2 mmol/L (ref 3.5–5.1)
SODIUM: 135 mmol/L (ref 135–145)
TCO2: 24 mmol/L (ref 0–100)

## 2016-02-16 LAB — URINE MICROSCOPIC-ADD ON

## 2016-02-16 LAB — CK TOTAL AND CKMB (NOT AT ARMC)
CK, MB: 2.5 ng/mL (ref 0.5–5.0)
Relative Index: INVALID (ref 0.0–2.5)
Total CK: 39 U/L — ABNORMAL LOW (ref 49–397)

## 2016-02-16 LAB — MRSA PCR SCREENING: MRSA BY PCR: NEGATIVE

## 2016-02-16 LAB — POCT ACTIVATED CLOTTING TIME: Activated Clotting Time: 709 seconds

## 2016-02-16 SURGERY — LEFT HEART CATH AND CORONARY ANGIOGRAPHY
Anesthesia: LOCAL

## 2016-02-16 MED ORDER — HEPARIN SODIUM (PORCINE) 1000 UNIT/ML IJ SOLN
INTRAMUSCULAR | Status: AC
  Filled 2016-02-16: qty 1

## 2016-02-16 MED ORDER — CLOPIDOGREL BISULFATE 300 MG PO TABS
ORAL_TABLET | ORAL | Status: AC
  Filled 2016-02-16: qty 1

## 2016-02-16 MED ORDER — SODIUM CHLORIDE 0.9 % WEIGHT BASED INFUSION
3.0000 mL/kg/h | INTRAVENOUS | Status: DC
  Administered 2016-02-16: 3 mL/kg/h via INTRAVENOUS

## 2016-02-16 MED ORDER — ACETAMINOPHEN 325 MG PO TABS
650.0000 mg | ORAL_TABLET | ORAL | Status: DC | PRN
  Administered 2016-02-17 – 2016-02-18 (×2): 650 mg via ORAL
  Filled 2016-02-16 (×2): qty 2

## 2016-02-16 MED ORDER — IOPAMIDOL (ISOVUE-370) INJECTION 76%
INTRAVENOUS | Status: AC
  Filled 2016-02-16: qty 50

## 2016-02-16 MED ORDER — VERAPAMIL HCL 2.5 MG/ML IV SOLN
INTRAVENOUS | Status: DC | PRN
  Administered 2016-02-16: 15:00:00 via INTRA_ARTERIAL

## 2016-02-16 MED ORDER — IOPAMIDOL (ISOVUE-370) INJECTION 76%
INTRAVENOUS | Status: AC
  Filled 2016-02-16: qty 125

## 2016-02-16 MED ORDER — BIVALIRUDIN 250 MG IV SOLR
INTRAVENOUS | Status: AC
  Filled 2016-02-16: qty 250

## 2016-02-16 MED ORDER — SODIUM CHLORIDE 0.9% FLUSH
3.0000 mL | Freq: Two times a day (BID) | INTRAVENOUS | Status: DC
  Administered 2016-02-16 – 2016-02-19 (×6): 3 mL via INTRAVENOUS

## 2016-02-16 MED ORDER — CLOPIDOGREL BISULFATE 300 MG PO TABS
ORAL_TABLET | ORAL | Status: DC | PRN
  Administered 2016-02-16: 600 mg via ORAL

## 2016-02-16 MED ORDER — HEPARIN (PORCINE) IN NACL 2-0.9 UNIT/ML-% IJ SOLN
INTRAMUSCULAR | Status: DC | PRN
  Administered 2016-02-16: 15:00:00

## 2016-02-16 MED ORDER — SODIUM CHLORIDE 0.9 % IV SOLN
250.0000 mg | INTRAVENOUS | Status: DC | PRN
  Administered 2016-02-16: 1.75 mg/kg/h via INTRAVENOUS

## 2016-02-16 MED ORDER — SODIUM CHLORIDE 0.9 % IV SOLN
INTRAVENOUS | Status: DC
  Administered 2016-02-16: 14:00:00 via INTRAVENOUS

## 2016-02-16 MED ORDER — SODIUM CHLORIDE 0.9% FLUSH: 3.0000 mL | INTRAVENOUS | Status: DC | PRN

## 2016-02-16 MED ORDER — LIDOCAINE HCL (PF) 1 % IJ SOLN
INTRAMUSCULAR | Status: AC
  Filled 2016-02-16: qty 30

## 2016-02-16 MED ORDER — CLOPIDOGREL BISULFATE 75 MG PO TABS
75.0000 mg | ORAL_TABLET | Freq: Every day | ORAL | Status: DC
  Administered 2016-02-17 – 2016-02-20 (×4): 75 mg via ORAL
  Filled 2016-02-16 (×4): qty 1

## 2016-02-16 MED ORDER — ATROPINE SULFATE 1 MG/ML IJ SOLN: 0.2500 mg | Freq: Once | INTRAMUSCULAR | Status: DC

## 2016-02-16 MED ORDER — HEPARIN (PORCINE) IN NACL 2-0.9 UNIT/ML-% IJ SOLN
INTRAMUSCULAR | Status: AC
  Filled 2016-02-16: qty 1000

## 2016-02-16 MED ORDER — ASPIRIN 81 MG PO CHEW
81.0000 mg | CHEWABLE_TABLET | Freq: Every day | ORAL | Status: DC
  Filled 2016-02-16: qty 1

## 2016-02-16 MED ORDER — BIVALIRUDIN BOLUS VIA INFUSION - CUPID
INTRAVENOUS | Status: DC | PRN
  Administered 2016-02-16: 66.375 mg via INTRAVENOUS

## 2016-02-16 MED ORDER — SODIUM CHLORIDE 0.9 % IV SOLN: 250.0000 mL | INTRAVENOUS | Status: DC | PRN

## 2016-02-16 MED ORDER — ONDANSETRON HCL 4 MG/2ML IJ SOLN
4.0000 mg | Freq: Four times a day (QID) | INTRAMUSCULAR | Status: DC | PRN
  Administered 2016-02-20: 4 mg via INTRAVENOUS
  Filled 2016-02-16: qty 2

## 2016-02-16 MED ORDER — DEXTROSE 5 % IV SOLN
1.0000 g | INTRAVENOUS | Status: DC
  Administered 2016-02-16 – 2016-02-18 (×3): 1 g via INTRAVENOUS
  Filled 2016-02-16 (×3): qty 10

## 2016-02-16 MED ORDER — NITROGLYCERIN 1 MG/10 ML FOR IR/CATH LAB
INTRA_ARTERIAL | Status: AC
  Filled 2016-02-16: qty 10

## 2016-02-16 SURGICAL SUPPLY — 15 items
BALLN EMERGE MR 2.0X12 (BALLOONS) ×2
BALLOON EMERGE MR 2.0X12 (BALLOONS) IMPLANT
CATH INFINITI JR4 5F (CATHETERS) ×1 IMPLANT
DEVICE RAD COMP TR BAND LRG (VASCULAR PRODUCTS) ×1 IMPLANT
ELECT DEFIB PAD ADLT CADENCE (PAD) ×1 IMPLANT
GLIDESHEATH SLEND SS 6F .021 (SHEATH) ×1 IMPLANT
GUIDE CATH RUNWAY 6FR CLS3.5 (CATHETERS) ×1 IMPLANT
KIT ENCORE 26 ADVANTAGE (KITS) ×1 IMPLANT
KIT HEART LEFT (KITS) ×2 IMPLANT
PACK CARDIAC CATHETERIZATION (CUSTOM PROCEDURE TRAY) ×2 IMPLANT
STENT PROMUS PREM MR 2.25X12 (Permanent Stent) ×1 IMPLANT
TRANSDUCER W/STOPCOCK (MISCELLANEOUS) ×2 IMPLANT
TUBING CIL FLEX 10 FLL-RA (TUBING) ×2 IMPLANT
WIRE ASAHI PROWATER 180CM (WIRE) ×2 IMPLANT
WIRE SAFE-T 1.5MM-J .035X260CM (WIRE) ×1 IMPLANT

## 2016-02-16 NOTE — Discharge Summary (Signed)
Physician Discharge Summary  Patient ID: Jason Hawkins MRN: [id number] DOB/AGE: [birth year] 82 y.o.  Admit date: 02/13/2016 Discharge date: 02/16/2016  Discharge Diagnoses:  Principal Problem:   Junctional rhythm Active Problems:   SAH (subarachnoid hemorrhage) (HCC)   Cognitive deficit, post-stroke   Gait disturbance, post-stroke   Hypotension   Hyponatremia   Discharged Condition: Guarded.   Significant Diagnostic Studies: Ct Angio Head W Or Wo Contrast  01/31/2016  CLINICAL DATA:  Subarachnoid hemorrhage. No known trauma. On Coumadin for aortic valve replacement. EXAM: CT ANGIOGRAPHY HEAD AND NECK TECHNIQUE: Multidetector CT imaging of the head and neck was performed using the standard protocol during bolus administration of intravenous contrast. Multiplanar CT image reconstructions and MIPs were obtained to evaluate the vascular anatomy. Carotid stenosis measurements (when applicable) are obtained utilizing NASCET criteria, using the distal internal carotid diameter as the denominator. CONTRAST:  50 mL Isovue 370 IV COMPARISON:  CT head 01/31/2016 FINDINGS: CTA NECK Aortic arch: Atherosclerotic calcification aortic arch without aneurysm or dissection. Atherosclerotic calcification proximal great vessels which are widely patent. Lung apices clear. Right carotid system: Right common carotid artery widely patent. Mild atherosclerotic calcification in the right carotid bifurcation without significant stenosis. Left carotid system: Left common carotid artery widely patent. Atherosclerotic calcification of the carotid bifurcation without significant stenosis. Vertebral arteries:Both vertebral arteries are patent to the basilar without significant vertebral stenosis. Skeleton: Moderately severe cervical spondylosis. No fracture or mass lesion. Other neck: Negative for mass or adenopathy in the neck. CTA HEAD Anterior circulation: Mild atherosclerotic calcification in the cavernous carotid artery  bilaterally. No aneurysm in the cavernous carotid. Anterior and middle cerebral arteries patent bilaterally. 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 This is presumably the cause of the symmetric subarachnoid hemorrhage. Posterior circulation: Both vertebral arteries patent to the basilar. PICA patent. Basilar widely patent. Superior cerebellar and posterior cerebral arteries patent bilaterally. 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 This is presumably the cause of acute subarachnoid hemorrhage. No other aneurysm. Mild atherosclerotic disease the carotid bifurcation bilaterally without significant carotid stenosis. No significant vertebral stenosis. These results were called by telephone at the time of interpretation on 01/31/2016 at 10:12 pm to Dr. Wallie Char , who verbally acknowledged these results. Electronically Signed   By: Franchot Gallo M.D.   On: 01/31/2016 22:14   Ct Head Wo Contrast  02/03/2016  CLINICAL DATA:  Continued surveillance subarachnoid hemorrhage. EXAM: CT HEAD WITHOUT CONTRAST TECHNIQUE: Contiguous axial images were obtained from the base of the skull through the vertex without intravenous contrast. COMPARISON:  Multiple priors. FINDINGS: The patient has undergone endovascular treatment with coil embolization of an anterior communicating artery aneurysm. Coil mass appears compact without migration. Resolution of previously noted blood in the basilar and interhemispheric cisterns. Slight layering hemorrhage in the occipital horns of the lateral ventricles but no intervening hydrocephalus, and no new subarachnoid blood. No visible cerebral infarction due to vasospasm. Mild sinus fluid predominantly sphenoid, likely due to recumbency. No mastoid fluid. Calvarium intact. Negative orbits. IMPRESSION: Satisfactory appearance status post coiling  of an ACom aneurysm. No postprocedural complications.  No intervening hydrocephalus. Electronically Signed   By: Staci Righter M.D.   On: 02/03/2016 11:16   Ct Head Wo Contrast  01/31/2016  CLINICAL DATA:  Sudden onset of headaches EXAM: CT HEAD WITHOUT CONTRAST TECHNIQUE: Contiguous axial images were obtained from the base  of the skull through the vertex without intravenous contrast. COMPARISON:  None. FINDINGS: Bony calvarium is intact. There are changes consistent with subarachnoid hemorrhage along the distribution of the middle cerebral arteries and anterior cerebral arteries bilaterally. Given the patient's clinical history these changes are consistent with ruptured aneurysm. No intraventricular hemorrhage is noted. The basilar cisterns are not filled with blood. No mass lesion is noted. No acute infarct is seen. Mild atrophic changes are noted. IMPRESSION: Subarachnoid hemorrhage as described above. A definitive aneurysm is not noted on this exam. Critical Value/emergent results were called by telephone at the time of interpretation on 01/31/2016 at 9:28 pm to Dr. Nicole Kindred, who verbally acknowledged these results. Electronically Signed   By: Inez Catalina M.D.   On: 01/31/2016 21:30   Ct Angio Neck W Or Wo Contrast  01/31/2016  CLINICAL DATA:  Subarachnoid hemorrhage. No known trauma. On Coumadin for aortic valve replacement. EXAM: CT ANGIOGRAPHY HEAD AND NECK TECHNIQUE: Multidetector CT imaging of the head and neck was performed using the standard protocol during bolus administration of intravenous contrast. Multiplanar CT image reconstructions and MIPs were obtained to evaluate the vascular anatomy. Carotid stenosis measurements (when applicable) are obtained utilizing NASCET criteria, using the distal internal carotid diameter as the denominator. CONTRAST:  50 mL Isovue 370 IV COMPARISON:  CT head 01/31/2016 FINDINGS: CTA NECK Aortic arch: Atherosclerotic calcification aortic arch without aneurysm or  dissection. Atherosclerotic calcification proximal great vessels which are widely patent. Lung apices clear. Right carotid system: Right common carotid artery widely patent. Mild atherosclerotic calcification in the right carotid bifurcation without significant stenosis. Left carotid system: Left common carotid artery widely patent. Atherosclerotic calcification of the carotid bifurcation without significant stenosis. Vertebral arteries:Both vertebral arteries are patent to the basilar without significant vertebral stenosis. Skeleton: Moderately severe cervical spondylosis. No fracture or mass lesion. Other neck: Negative for mass or adenopathy in the neck. CTA HEAD Anterior circulation: Mild atherosclerotic calcification in the cavernous carotid artery bilaterally. No aneurysm in the cavernous carotid. Anterior and middle cerebral arteries patent bilaterally. 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 This is presumably the cause of the symmetric subarachnoid hemorrhage. Posterior circulation: Both vertebral arteries patent to the basilar. PICA patent. Basilar widely patent. Superior cerebellar and posterior cerebral arteries patent bilaterally. 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 This is presumably the cause of acute subarachnoid hemorrhage. No other aneurysm. Mild atherosclerotic disease the carotid bifurcation bilaterally without significant carotid stenosis. No significant vertebral stenosis. These results were called by telephone at the time of interpretation on 01/31/2016 at 10:12 pm to Dr. Wallie Char , who verbally acknowledged these results. Electronically Signed   By: Franchot Gallo M.D.   On: 01/31/2016 22:14   Dg Chest Port 1 View  02/08/2016  CLINICAL DATA:  Dyspnea and lethargy for 1 day, history hypertension, atrial fibrillation, aortic stenosis  post AVR, prostate cancer EXAM: PORTABLE CHEST 1 VIEW COMPARISON:  Portable exam 1953 hours compared to 02/05/2016 FINDINGS: Enlargement of cardiac silhouette post median sternotomy and AVR. Atherosclerotic calcification aortic arch. Mediastinal contours and pulmonary vascularity normal. Bibasilar atelectasis with improved aeration at LEFT base since previous exam. Tiny LEFT pleural effusion. Improved RIGHT upper lobe infiltrate. No pneumothorax. Degenerative changes RIGHT AC joint. IMPRESSION: Bibasilar atelectasis and tiny LEFT pleural effusion. Improved RIGHT upper lobe infiltrate. Improved aeration since previous exam. Enlargement of cardiac silhouette  post AVR. Electronically Signed   By: Lavonia Dana M.D.   On: 02/08/2016 20:04   Dg Chest Port 1 View  02/05/2016  CLINICAL DATA:  Acute respiratory failure Fever of 100 this morning O2 at 96% EXAM: PORTABLE CHEST - 1 VIEW COMPARISON:  the previous day's study FINDINGS: Slight improvement in the focal right upper and lower lobe airspace disease. Left retrocardiac consolidation/atelectasis persists. Heart size upper limits normal.  Previous CABG and AVR. Small pleural effusions persist. Degenerative changes in the right shoulder. IMPRESSION: 1. Slight improvement in right lung airspace disease. 2. Small effusions with persistent left retrocardiac consolidation/atelectasis. Electronically Signed   By: Lucrezia Europe M.D.   On: 02/05/2016 08:40   Dg Chest Port 1 View  02/04/2016  CLINICAL DATA:  Shortness of Breath EXAM: PORTABLE CHEST 1 VIEW COMPARISON:  10/19/2003 FINDINGS: Cardiac shadow is mildly enlarged. Postsurgical changes are again seen. Diffuse patchy infiltrates are noted throughout the right lung as well as to a lesser degree in the left lung base. No bony abnormality is noted. IMPRESSION: Bilateral airspace opacities right greater than left. Followup examination is recommended. Electronically Signed   By: Inez Catalina M.D.   On: 02/04/2016 10:11     Labs:  Basic Metabolic Panel:  Recent Labs Lab 02/14/16 0639  NA 132*  K 4.0  CL 100*  CO2 25  GLUCOSE 124*  BUN 23*  CREATININE 0.75  CALCIUM 8.8*    CBC:  Recent Labs Lab 02/14/16 0639  WBC 10.0  NEUTROABS 7.5  HGB 12.7*  HCT 37.4*  MCV 93.5  PLT 373    CBG: No results for input(s): GLUCAP in the last 168 hours.  Brief HPI:   Jason Hawkins is a 82 y.o. 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 He was found to have [place] and cerebral angiogram revealed 1.5 X 3 mm ACA aneurysm and INR 1.8.  Coumadin reversed and BP elevated therefore he was placed on Cardene drip for BP control. He underwent coil embolization of ACA aneurysm on 02/01/16 by Dr. Kathyrn Sheriff. Post op reported to have CP due to fluid overload and improved with IV diuresis. Headaches improved and therapy ongoing. CIR recommended due to unsteady gait and LOB with activity.    Hospital Course: Thienan Persons was admitted to rehab 02/13/2016 for inpatient therapies to consist of PT, ST and OT at least three hours five days a week. Past admission physiatrist, therapy team and rehab RN have worked together to provide customized collaborative inpatient rehab. He continued to complain of HA therefore topamax was added and titrated to 50 mg for symptom relief. Eliquis was started at admission per NS clearance. Follow up labs showed mild hyponatremia and sodium restrictions were discontinued.  He did report frequency on 07/13 am and UA/UCS was ordered.   Speech therapy evaluation at admission showed moderate cognitive deficits with poor attention due lethargy. He required min assist for ADL tasks and mobility. Lethargy was resolving but he required incrased time to complete tasks. On 07/13 pm he developed nausea with decrease in MS. He was found to be hypotensive with SBP in 70's and EKG showed junctional rhythm with heart rate in low to mid 30's.   He was  treated with one liter NaCl bolus and cardiology was consulted for assistance.  Cardiac enzymes ordered for work up. Dr. Sallyanne Kuster evaluated patient and felt that pacemaker was likely indicated and patient was given two doses  of atropine with improvement in HR to 40-50 range. He was transferred to CCU for  Further work up and treatment. He was discharged to acute on 02/16/16.    Current medications:    . amLODipine  2.5 mg Oral Daily  . apixaban  2.5 mg Oral BID  . aspirin EC  325 mg Oral Daily  . docusate sodium  100 mg Oral BID  . multivitamin with minerals  1 tablet Oral Daily  . niMODipine  60 mg Oral Q4H  . topiramate  50 mg Oral QHS    Diet:  Regular.   Disposition:  To acute hospital--CCU  Signed: Bary Leriche 02/16/2016, 1:35 PM

## 2016-02-16 NOTE — H&P (Signed)
History & Physical    Patient ID: Jason Hawkins MRN: [id number], DOB/AGE: [birth year]   Admit date: 02/13/2016   Primary Physician: Walker Kehr, MD Primary Cardiologist: Dr. Johnsie Cancel  Patient Profile    82 yo male with PMH of chronic a-fib (on coumadin)/AS with biological valve replacement/HTN and HLD who is s/p acute [facility] 01/31/16 and had his coumadin reversed and underwent a coil embolization 02/01/2016 who is currently in IP rehab.   Past Medical History    Past Medical History  Diagnosis Date  . Chronic atrial fibrillation ([place])   . Severe aortic stenosis     s/p aortic valve replacement using a pericardial tissue valve  . HTN (hypertension)   . Lower extremity edema   . Hypercholesteremia     Past Surgical History  Procedure Laterality Date  . Aortic valve replacement  08/30/2003    Dr. Cyndia Bent  . Radiology with anesthesia N/A 02/01/2016    Procedure: RADIOLOGY WITH ANESTHESIA;  Surgeon: Consuella Lose, MD;  Location: [facility];  Service: Radiology;  Laterality: N/A;     Allergies  Allergies  Allergen Reactions  . Bee Venom Anaphylaxis  . Codeine     nausea    History of Present Illness    Jason Hawkins is a 82 yo male with PMH of chronic a-fib (on coumadin)/AS with biological valve replacement/HTN and HLD who is s/p acute SAH 01/31/16 and had his coumadin reversed and underwent a coil embolization 02/01/2016. He has been receiving inpatient rehab, and has been started back on Eliquis. Appears his last LHC was in 2004 which showed only 70% ost stenosis to first marginal branch. No recent stress testing found. Family reports he has been feeling nauseated over the past 2 days, but thought this may have been related to his medications. This afternoon was began to feel weak and experienced pre-syncope and worsening nausea. Rapid response was called to the bedside and noted to be bradycardic and hypotensive. He was pale, cool and clammy. Bp noted 71/37. Stat EKG done which  showed a junctional rhythm at a rate of 30. He was placed on a zoll monitor, and cardiology was consulted. Dr. Sallyanne Kuster saw the patient in consultation.   Initial EKG showed what appeared to be a junctional rhythm, and was given 2 doses 2.5mg  of atropine, which increased his rate into the mid 40s, low 50s. EP was consulted related to symptomatic junctional rhythm, and possible pacemaker insertion. He denied any chest pain, but zoll monitoring was concerning for ST elevation. Repeat EKG was done and showed an acute inferior MI. The cath lab was called for emergent catheterization. Bp improved with IV fluids, and he remained in [facility] in transport to the cath lab. Dr. Martinique to perform LHC.   Home Medications    Prior to Admission medications   Medication Sig Start Date End Date Taking? Authorizing Provider  co-enzyme Q-10 50 MG capsule Take 50 mg by mouth daily.    Historical Provider, MD  diphenhydrAMINE (BENADRYL) 25 MG tablet Take 2 tablets (50 mg total) by mouth every 6 (six) hours as needed for itching or allergies (bee sting). 03/17/13   Evie Lacks Plotnikov, MD  EPINEPHrine 0.3 mg/0.3 mL IJ SOAJ injection Inject 0.3 mg into the muscle once.    Historical Provider, MD  GLUCOSAMINE-CHONDROITIN PO Take 1 tablet by mouth daily.      Historical Provider, MD  Hyaluronic Acid 20-60 MG CAPS Take 1 tablet by mouth daily.      Historical Provider,  MD  L-ARGININE PO Take 1 capsule by mouth 4 (four) times a week.    Historical Provider, MD  L-CITRULLINE PO Take 1 capsule by mouth 4 (four) times a week.    Historical Provider, MD  Multiple Vitamin (MULTIVITAMIN) capsule Take 1 capsule by mouth daily.      Historical Provider, MD  OVER THE COUNTER MEDICATION Take 1 Dose by mouth 4 (four) times a week. Helps with Testerone.    Historical Provider, MD  Red Yeast Rice 600 MG CAPS Take 1 capsule by mouth daily. Reported on 12/28/2015    Historical Provider, MD  TURMERIC PO Take 1 tablet by mouth daily.       Historical Provider, MD  warfarin (COUMADIN) 5 MG tablet Take 1 tablet (5 mg total) by mouth as directed. 01/27/16   Josue Hector, MD    Family History    Family History  Problem Relation Age of Onset  . Heart attack    . Diabetes    . Diabetes Mother   . Heart disease Father   . Heart attack Father     Social History    Social History   Social History  . Marital Status: Widowed    Spouse Name: N/A  . Number of Children: N/A  . Years of Education: N/A   Occupational History  . Not on file.   Social History Main Topics  . Smoking status: Former Research scientist (life sciences)  . Smokeless tobacco: Not on file  . Alcohol Use: 8.4 oz/week    14 Cans of beer per week  . Drug Use: No  . Sexual Activity: No   Other Topics Concern  . Not on file   Social History Narrative     Review of Systems    General:  No chills, fever, night sweats or weight changes.  Cardiovascular: See HPI Dermatological: No rash, lesions/masses Respiratory: No cough, dyspnea Urologic: No hematuria, dysuria Abdominal:   + nausea, vomiting, diarrhea, bright red blood per rectum, melena, or hematemesis Neurologic:  No visual changes, wkns, changes in mental status. All other systems reviewed and are otherwise negative except as noted above.  Physical Exam    Blood pressure 73/31, pulse 97, temperature 98.6 F (37 C), temperature source Oral, resp. rate 20, height 5\' 9"  (1.753 m), weight 195 lb 1.7 oz (88.5 kg), SpO2 97 %.  The patient was seen and examined by Dr. Sallyanne Kuster General: Ill-appearing older male, NAD Psych: Normal affect. Neuro: Alert and oriented X 3, slightly lethargic, but easily awoken. Moves all extremities spontaneously. HEENT: Normal  Neck: Supple without bruits or JVD. Lungs:  Resp regular and unlabored, CTA. Heart: Bradycardic, irregular rhythm, no s3, s4, 1/6 systolic murmur. Abdomen: Soft, non-tender, non-distended, BS + x 4.  Extremities: No clubbing, cyanosis or edema. DP/PT/Radials 2+  and equal bilaterally.  Labs    Troponin (Point of Care Test) No results for input(s): TROPIPOC in the last 72 hours. No results for input(s): CKTOTAL, CKMB, TROPONINI in the last 72 hours. Lab Results  Component Value Date   WBC 10.0 02/14/2016   HGB 12.7* 02/14/2016   HCT 37.4* 02/14/2016   MCV 93.5 02/14/2016   PLT 373 02/14/2016    Recent Labs Lab 02/14/16 0639  NA 132*  K 4.0  CL 100*  CO2 25  BUN 23*  CREATININE 0.75  CALCIUM 8.8*  PROT 6.2*  BILITOT 1.0  ALKPHOS 65  ALT 53  AST 23  GLUCOSE 124*   Lab Results  Component  Value Date   CHOL 214* 01/04/2016   HDL 60.50 01/04/2016   LDLCALC 134* 01/04/2016   TRIG 93.0 01/04/2016   No results found for: [facility]   Radiology Studies    Ct Angio Head W Or Wo Contrast  01/31/2016  CLINICAL DATA:  Subarachnoid hemorrhage. No known trauma. On Coumadin for aortic valve replacement. EXAM: CT ANGIOGRAPHY HEAD AND NECK TECHNIQUE: Multidetector CT imaging of the head and neck was performed using the standard protocol during bolus administration of intravenous contrast. Multiplanar CT image reconstructions and MIPs were obtained to evaluate the vascular anatomy. Carotid stenosis measurements (when applicable) are obtained utilizing NASCET criteria, using the distal internal carotid diameter as the denominator. CONTRAST:  50 mL Isovue 370 IV COMPARISON:  CT head 01/31/2016 FINDINGS: CTA NECK Aortic arch: Atherosclerotic calcification aortic arch without aneurysm or dissection. Atherosclerotic calcification proximal great vessels which are widely patent. Lung apices clear. Right carotid system: Right common carotid artery widely patent. Mild atherosclerotic calcification in the right carotid bifurcation without significant stenosis. Left carotid system: Left common carotid artery widely patent. Atherosclerotic calcification of the carotid bifurcation without significant stenosis. Vertebral arteries:Both vertebral arteries are patent to  the basilar without significant vertebral stenosis. Skeleton: Moderately severe cervical spondylosis. No fracture or mass lesion. Other neck: Negative for mass or adenopathy in the neck. CTA HEAD Anterior circulation: Mild atherosclerotic calcification in the cavernous carotid artery bilaterally. No aneurysm in the cavernous carotid. Anterior and middle cerebral arteries patent bilaterally. 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 This is presumably the cause of the symmetric subarachnoid hemorrhage. Posterior circulation: Both vertebral arteries patent to the basilar. PICA patent. Basilar widely patent. Superior cerebellar and posterior cerebral arteries patent bilaterally. 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 This is presumably the cause of acute subarachnoid hemorrhage. No other aneurysm. Mild atherosclerotic disease the carotid bifurcation bilaterally without significant carotid stenosis. No significant vertebral stenosis. These results were called by telephone at the time of interpretation on 01/31/2016 at 10:12 pm to Dr. Wallie Char , who verbally acknowledged these results. Electronically Signed   By: Franchot Gallo M.D.   On: 01/31/2016 22:14   Ct Head Wo Contrast  02/03/2016  CLINICAL DATA:  Continued surveillance subarachnoid hemorrhage. EXAM: CT HEAD WITHOUT CONTRAST TECHNIQUE: Contiguous axial images were obtained from the base of the skull through the vertex without intravenous contrast. COMPARISON:  Multiple priors. FINDINGS: The patient has undergone endovascular treatment with coil embolization of an anterior communicating artery aneurysm. Coil mass appears compact without migration. Resolution of previously noted blood in the basilar and interhemispheric cisterns. Slight layering hemorrhage in the occipital horns of the lateral ventricles but  no intervening hydrocephalus, and no new subarachnoid blood. No visible cerebral infarction due to vasospasm. Mild sinus fluid predominantly sphenoid, likely due to recumbency. No mastoid fluid. Calvarium intact. Negative orbits. IMPRESSION: Satisfactory appearance status post coiling of an ACom aneurysm. No postprocedural complications.  No intervening hydrocephalus. Electronically Signed   By: Staci Righter M.D.   On: 02/03/2016 11:16   Ct Head Wo Contrast  01/31/2016  CLINICAL DATA:  Sudden onset of headaches EXAM: CT HEAD WITHOUT CONTRAST TECHNIQUE: Contiguous axial images were obtained from the base of the skull through the vertex without intravenous contrast. COMPARISON:  None. FINDINGS: Bony calvarium is intact. There are changes consistent with subarachnoid hemorrhage along the distribution of the  middle cerebral arteries and anterior cerebral arteries bilaterally. Given the patient's clinical history these changes are consistent with ruptured aneurysm. No intraventricular hemorrhage is noted. The basilar cisterns are not filled with blood. No mass lesion is noted. No acute infarct is seen. Mild atrophic changes are noted. IMPRESSION: Subarachnoid hemorrhage as described above. A definitive aneurysm is not noted on this exam. Critical Value/emergent results were called by telephone at the time of interpretation on 01/31/2016 at 9:28 pm to Dr. Nicole Kindred, who verbally acknowledged these results. Electronically Signed   By: Inez Catalina M.D.   On: 01/31/2016 21:30   Ct Angio Neck W Or Wo Contrast  01/31/2016  CLINICAL DATA:  Subarachnoid hemorrhage. No known trauma. On Coumadin for aortic valve replacement. EXAM: CT ANGIOGRAPHY HEAD AND NECK TECHNIQUE: Multidetector CT imaging of the head and neck was performed using the standard protocol during bolus administration of intravenous contrast. Multiplanar CT image reconstructions and MIPs were obtained to evaluate the vascular anatomy. Carotid stenosis  measurements (when applicable) are obtained utilizing NASCET criteria, using the distal internal carotid diameter as the denominator. CONTRAST:  50 mL Isovue 370 IV COMPARISON:  CT head 01/31/2016 FINDINGS: CTA NECK Aortic arch: Atherosclerotic calcification aortic arch without aneurysm or dissection. Atherosclerotic calcification proximal great vessels which are widely patent. Lung apices clear. Right carotid system: Right common carotid artery widely patent. Mild atherosclerotic calcification in the right carotid bifurcation without significant stenosis. Left carotid system: Left common carotid artery widely patent. Atherosclerotic calcification of the carotid bifurcation without significant stenosis. Vertebral arteries:Both vertebral arteries are patent to the basilar without significant vertebral stenosis. Skeleton: Moderately severe cervical spondylosis. No fracture or mass lesion. Other neck: Negative for mass or adenopathy in the neck. CTA HEAD Anterior circulation: Mild atherosclerotic calcification in the cavernous carotid artery bilaterally. No aneurysm in the cavernous carotid. Anterior and middle cerebral arteries patent bilaterally. 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 This is presumably the cause of the symmetric subarachnoid hemorrhage. Posterior circulation: Both vertebral arteries patent to the basilar. PICA patent. Basilar widely patent. Superior cerebellar and posterior cerebral arteries patent bilaterally. 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 This is presumably the cause of acute subarachnoid hemorrhage. No other aneurysm. Mild atherosclerotic disease the carotid bifurcation bilaterally without significant carotid stenosis. No significant vertebral stenosis. These results were called by telephone at the time of interpretation on 01/31/2016 at  10:12 pm to Dr. Wallie Char , who verbally acknowledged these results. Electronically Signed   By: Franchot Gallo M.D.   On: 01/31/2016 22:14   Dg Chest Port 1 View  02/08/2016  CLINICAL DATA:  Dyspnea and lethargy for 1 day, history hypertension, atrial fibrillation, aortic stenosis post AVR, prostate cancer EXAM: PORTABLE CHEST 1 VIEW COMPARISON:  Portable exam 1953 hours compared to 02/05/2016 FINDINGS: Enlargement of cardiac silhouette post median sternotomy and AVR. Atherosclerotic calcification aortic arch. Mediastinal contours and pulmonary vascularity normal. Bibasilar atelectasis with improved aeration at LEFT base since previous exam. Tiny LEFT pleural effusion. Improved RIGHT upper lobe infiltrate. No pneumothorax. Degenerative changes RIGHT AC joint. IMPRESSION: Bibasilar atelectasis and tiny LEFT pleural effusion. Improved RIGHT upper lobe infiltrate. Improved aeration since previous exam. Enlargement of cardiac silhouette post AVR. Electronically Signed   By: Lavonia Dana M.D.   On: 02/08/2016 20:04   ECG & Cardiac Imaging    EKG: Initial EKG junctional  rhythm Rate-30, Repeat EKG showing A-fib with STEMI in inferior leads  ECHO: 02/04/2016  Study Conclusions  - Left ventricle: The cavity size was normal. Wall thickness was  increased in a pattern of mild LVH. Systolic function was normal.  The estimated ejection fraction was in the range of 55% to 60%.  Wall motion was normal; there were no regional wall motion  abnormalities. - Aortic valve: A bioprosthesis was present and functioning  normally. The prosthesis had a normal range of motion. There was  mild regurgitation. Valve area (VTI): 1.77 cm^2. Valve area  (Vmax): 1.73 cm^2. Valve area (Vmean): 1.66 cm^2. - Mitral valve: There was mild regurgitation. - Left atrium: The atrium was severely dilated. - Right atrium: The atrium was mildly to moderately dilated. - Tricuspid valve: There was moderate regurgitation. -  Pulmonary arteries: Systolic pressure was moderately increased.  PA peak pressure: 51 mm Hg (S).  Assessment & Plan    Jason Hawkins is a 82 yo male with PMH of chronic a-fib (on coumadin)/AS with biological valve replacement/HTN and HLD who is s/p acute SAH 01/31/16 and had his coumadin reversed and underwent a coil embolization 02/01/2016. He has been receiving inpatient rehab, and has been started back on Eliquis. Appears his last LHC was in 2004 which showed only 70% ost stenosis to first marginal branch. Family reports he has been feeling nauseated over the past 2 days. This afternoon was began to feel weak and experienced pre-syncope and worsening nausea. Rapid response was called to the bedside and noted to be bradycardic and hypotensive. He was pale, cool and clammy. Bp noted 71/37. Stat EKG done which showed a junctional rhythm at a rate of 30. He was placed on a zoll monitor, and cardiology was consulted. Dr. Sallyanne Kuster saw the patient in consultation.   1. STEMI: He was taken emergently to the cath lab with Dr. Martinique. Further recommendations to follow post procedure.   Barnet Pall, NP-C Pager [phone] 02/16/2016, 2:16 PM   I have seen and examined the patient along with Jason Bellis, NP-C.  I have reviewed the chart, notes and new data.  I agree with NP's note.  Key new complaints: off/on nausea for last 2 days, without chest pain. Today had sudden nausea and presyncope sitting up in bed with hypotension and marked bradycardia (idioventricular escape rhythm on background of longstanding permanent atrial fibrillation). BP recovered promptly with supine position. After low dose atropine, had return of AV conduction and gradual development of inferior ST segment elevation (still without nausea). Denies headache or neurological symptoms. No dyspnea. Not on chronic AV blocking agents. Received Eliquis and ASA 325 mg this AM> Key examination changes: irregular rhythm, faint Ao  ejection murmur, no diastolic murmurs, no gallop or rub, warm extremities Key new findings / data: labs pending; ECG: idioventricular 30 bpm ---> atrial fibrillation with slow response,  2 mm upwardly convex ST elevation  II, III, F.  PLAN: Emergency PCI/stent for inferior wall STEMi with secondary high grade AV block. Increased risk of bleeding complications due to concomitant Eliquis. Consider bare metal stent if angiographically appropriate due to need for chronic anticoagulation. Family at bedside. This procedure has been fully reviewed with the patient and informed consent has been obtained.   Sanda Klein, MD, [place] [phone] 02/16/2016, 3:10 PM

## 2016-02-16 NOTE — Progress Notes (Signed)
Occupational Therapy Session Note  Patient Details  Name: Jason Hawkins MRN: [id number] Date of Birth: 05/07/1928  Today's Date: 02/16/2016 OT Individual Time: 0800-0900 OT Individual Time Calculation (min): 60 min    Short Term Goals: Week 1:  OT Short Term Goal 1 (Week 1): Pt will complete UB dressing with Min A.  OT Short Term Goal 2 (Week 1): Pt will tolerate standing for 4 minutes to complete grooming tasks at sink.  OT Short Term Goal 3 (Week 1): Pt will complete tub/shower transfers with supervision.  OT Short Term Goal 4 (Week 1): Pt will complete LB dressing with supervision.   Skilled Therapeutic Interventions/Progress Updates:    Pt resting in bed upon arrival eating breakfast.  Pt requested to use the toilet and amb with RW to bathroom.  Pt completed periarea hygiene while standing at sink. Pt returned to room and engaged in dressing tasks.  Unable to engage in bathing tasks secondary to time constraints.  Pt did complete shaving tasks while seated at sink. Pt required more than a reasonable amount of time to complete all tasks (eating, toileting, and dressing) with multiple rest breaks.  Pt is very deliberate and particular with all self care tasks.  Pt commented on how fatigued he became with the simplest of tasks. Focus on activity tolerance, functional amb with RW, standing balance, and safety awareness to increase independence with BADLs.   Therapy Documentation Precautions:  Precautions Precautions: Fall Restrictions Weight Bearing Restrictions: No Pain: Pain Assessment Pain Assessment: No/denies pain  See Function Navigator for Current Functional Status.   Therapy/Group: Individual Therapy  Leroy Libman 02/16/2016, 10:00 AM

## 2016-02-16 NOTE — Progress Notes (Signed)
Pt family concerned, pt on too much IVF after being given Lasix in prior ICU, MD/N and stated to update pt on fluid based on heart cath directly and as a protective measure for pt's kidney, family updated, nursing will cont to monitor

## 2016-02-16 NOTE — Progress Notes (Signed)
Social Work Patient ID: Jason Hawkins, male   DOB: 02/27/1928, 82 y.o.   MRN: [id number] Pt being transferred to CCU for medical monitoring. Plan was for supervision level goals and target discharge 7/22. Had not reached his rehab goals, will discuss with Admission Coordinators.

## 2016-02-16 NOTE — Progress Notes (Signed)
Complaint of pain when voiding pt worried about infection notified MD w/ order for UA.

## 2016-02-16 NOTE — IPOC Note (Signed)
Overall Plan of Care [facility]) Patient Details Name: Jason Hawkins MRN: [id number] DOB: 01-12-1928  Admitting Diagnosis: SAH - ACA ANEURYSM  Hospital Problems: Principal Problem:   SAH (subarachnoid hemorrhage) ([place])     Functional Problem List: Nursing Bladder, Bowel, Edema, Endurance, Medication Management, Nutrition, Pain, Safety, Skin Integrity  PT Balance, Endurance, Motor, Pain  OT Balance, Endurance, Motor, Skin Integrity, Vision, Cognition, Edema, Safety, Pain  SLP Cognition, Linguistic  TR         Basic ADL's: OT Bathing, Dressing, Grooming, Toileting     Advanced  ADL's: OT Simple Meal Preparation, Laundry, Light Housekeeping     Transfers: PT Bed Mobility, Bed to Chair, Car, [employer], Floor  OT Tub/Shower, Agricultural engineer: PT Emergency planning/management officer, Stairs, Ambulation     Additional Impairments: OT None  SLP Communication, Social Cognition expression Social Interaction, Problem Solving, Memory, Attention, Awareness  TR      Anticipated Outcomes Item Anticipated Outcome  Self Feeding Mod I  Swallowing      Basic self-care  Mod I  Toileting  Mod I   Bathroom Transfers Mod I   Bowel/Bladder  Mod I  Transfers  supervision  Locomotion  supervision ambulatory  Communication  Mod I   Cognition  Mod I   Pain  3 or less  Safety/Judgment  Mod I   Therapy Plan: PT Intensity: Minimum of 1-2 x/day ,45 to 90 minutes PT Frequency: 5 out of 7 days PT Duration Estimated Length of Stay: 10-12 days OT Intensity: Minimum of 1-2 x/day, 45 to 90 minutes OT Frequency: 5 out of 7 days OT Duration/Estimated Length of Stay: 11-14 days SLP Intensity: Minumum of 1-2 x/day, 30 to 90 minutes SLP Frequency: 3 to 5 out of 7 days SLP Duration/Estimated Length of Stay: 13-17 days        Team Interventions: Nursing Interventions Patient/Family Education, Medication Management, Bladder Management, Bowel Management, Disease Management/Prevention, Pain Management,  Discharge Planning, Skin Care/Wound Management  PT interventions Ambulation/gait training, Balance/vestibular training, Cognitive remediation/compensation, Community reintegration, Discharge planning, Disease management/prevention, DME/adaptive equipment instruction, Functional mobility training, Neuromuscular re-education, Pain management, Patient/family education, Psychosocial support, Stair training, Therapeutic Activities, Therapeutic Exercise, UE/LE Strength taining/ROM, UE/LE Coordination activities  OT Interventions Training and development officer, Cognitive remediation/compensation, Community reintegration, Discharge planning, DME/adaptive equipment instruction, Disease mangement/prevention, Functional mobility training, Neuromuscular re-education, Patient/family education, Pain management, Self Care/advanced ADL retraining, Therapeutic Activities, Therapeutic Exercise, UE/LE Strength taining/ROM, UE/LE Coordination activities, Visual/perceptual remediation/compensation  SLP Interventions Cognitive remediation/compensation, Cueing hierarchy, Environmental controls, Functional tasks, Medication managment, Patient/family education, Speech/Language facilitation  TR Interventions    SW/CM Interventions Discharge Planning, Psychosocial Support, Patient/Family Education    Team Discharge Planning: Destination: PT-Home ,OT- Home , SLP-Home Projected Follow-up: PT-Home health PT, 24 hour supervision/assistance, OT-  Home health OT, SLP-Other (comment) (TBD ) Projected Equipment Needs: PT-To be determined, OT- To be determined, SLP-None recommended by SLP Equipment Details: PT- , OT-  Patient/family involved in discharge planning: PT- Patient,  OT-Patient, Family member/caregiver, SLP-Patient  MD ELOS: 82 y.o. 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 He was found to have [place] and cerebral angiogram revealed 1.5 X 3 mm ACA aneurysm  INR 1.8  and coumadin reversed. BP elevated therefore he was placed on Cardene drip for BP control. He underwent coil embolization of ACA aneurysm on 02/01/16 by Dr. Kathyrn Hawkins. Post op reported to have CP due to fluid overload and improved with IV diuresis. Headaches improved  and therapy ongoing. CIR recommended due to unsteady gait and LOB with activity Medical Rehab Prognosis:  Excellent Assessment: 8-13 days   Now requiring 24/7 Rehab RN,MD, as well as CIR level PT, OT and SLP.  Treatment team will focus on ADLs and mobility with goals set at [facility]  See Team Conference Notes for weekly updates to the plan of care

## 2016-02-16 NOTE — Significant Event (Signed)
Rapid Response Event Note  Overview:  Called to assist with patient with decreased BP Time Called: 1251 Arrival Time: 1253 Event Type: Cardiac  Initial Focused Assessment:  On arrival pale, cool and clammy, arouses - answers questions - moves all extremities - denies CP or HA - BP 71/37 - placed on Zoll monitor - HR 30 - resps regular and unlabored - bil BS = clear - O2 sat 98% on RA. Algis Liming PA at bedside.   Interventions:  Stat insertion of #20 angiocath left wrist - NS started at wide open -  Stat 12 lead EKG done - shows JR -rate 30 - stat cardiology consult placed per Pam Love PA - placed on O2 mask per patient request - denies SOB but states he always feels better with mask - Bp responding to fluid - 112/45 - color better HR remains 35-40 - now skin dry - Patient alert - continues to deny chest pain or SOB - no headache - moves all extremities - pupils 3ERL - oriented and appropriate.  Dr. Ella Bodo in.  BP 136/45 HR 49 RR 12 O2 sats 100%.  Dr.Croitoru in - 1335:  0.25 mg Atropine IV given per order Dr. Sallyanne Kuster. 1337: 0.25 mg Atropine IV given per order Dr. Sallyanne Kuster.  HR to 57 - Bp 146/75.  Patient asx - color better - resting without complaints.  1405: Rhythm change on monitor - Dr. Sallyanne Kuster in - 12 lead EKG reveals STEMI - patient remains pain free - no nausea - BP 142/67.  STEMI called - daughter and other family members addressed by Dr. Sallyanne Kuster.  Cath team to bedside - remains sleepy but easily aroused.  transfered to cath lab with cath team - Handoff to cath team RN - family updated.    Plan of Care (if not transferred):  Event Summary: Name of Physician Notified: Algis Liming PA  Dr. Ella Bodo at 1250  Name of Consulting Physician Notified: Dr. Vilma Meckel at 1300  Outcome: Transferred (Comment)     Vevelyn Royals, Nena Jordan

## 2016-02-16 NOTE — Progress Notes (Signed)
[name] responded to Code Stemi. PT was being transferred from 4W to Cath lab. Escorted family to [facility] waiting room. Provided the ministry of presence. I am available for follow up as needed.  Jason Hawkins Jason Hawkins    02/16/16 1400  Clinical Encounter Type  Visited With Patient;Family  Visit Type Initial;Spiritual support;Patient in surgery;Code  Referral From Nurse  Spiritual Encounters  Spiritual Needs Prayer;Emotional  Stress Factors  Patient Stress Factors Health changes  Family Stress Factors Health changes;Major life changes

## 2016-02-16 NOTE — H&P (Signed)
History & Physical    Patient ID: Jason Hawkins MRN: [id number], DOB/AGE: [birth year]   Admit date: 02/16/2016   Primary Physician: Walker Kehr, MD Primary Cardiologist: Dr. Johnsie Cancel  Patient Profile    82 yo male with PMH of chronic a-fib (on coumadin)/AS with biological valve replacement/HTN and HLD who is s/p acute [facility] 01/31/16 and had his coumadin reversed and underwent a coil embolization 02/01/2016 who is currently in IP rehab.   Past Medical History    Past Medical History  Diagnosis Date  . Chronic atrial fibrillation ([place])   . Severe aortic stenosis     s/p aortic valve replacement using a pericardial tissue valve  . HTN (hypertension)   . Lower extremity edema   . Hypercholesteremia     Past Surgical History  Procedure Laterality Date  . Aortic valve replacement  08/30/2003    Dr. Cyndia Bent  . Radiology with anesthesia N/A 02/01/2016    Procedure: RADIOLOGY WITH ANESTHESIA;  Surgeon: Consuella Lose, MD;  Location: [facility];  Service: Radiology;  Laterality: N/A;     Allergies  Allergies  Allergen Reactions  . Bee Venom Anaphylaxis  . Codeine     nausea    History of Present Illness    [name] is a 82 yo male with PMH of chronic a-fib (on coumadin)/AS with biological valve replacement/HTN and HLD who is s/p acute SAH 01/31/16 and had his coumadin reversed and underwent a coil embolization 02/01/2016. He has been receiving inpatient rehab, and has been started back on Eliquis. Appears his last LHC was in 2004 which showed only 70% ost stenosis to first marginal branch. No recent stress testing found. Family reports he has been feeling nauseated over the past 2 days, but thought this may have been related to his medications. This afternoon was began to feel weak and experienced pre-syncope and worsening nausea. Rapid response was called to the bedside and noted to be bradycardic and hypotensive. He was pale, cool and clammy. Bp noted 71/37. Stat EKG done which  showed a junctional rhythm at a rate of 30. He was placed on a zoll monitor, and cardiology was consulted. Dr. Sallyanne Kuster saw the patient in consultation.   Initial EKG showed what appeared to be a junctional rhythm, and was given 2 doses 2.5mg  of atropine, which increased his rate into the mid 40s, low 50s. EP was consulted related to symptomatic junctional rhythm, and possible pacemaker insertion. He denied any chest pain, but zoll monitoring was concerning for ST elevation. Repeat EKG was done and showed an acute inferior MI. The cath lab was called for emergent catheterization. Bp improved with IV fluids, and he remained in [facility] in transport to the cath lab. Dr. Martinique to perform LHC.   Home Medications    Prior to Admission medications   Medication Sig Start Date End Date Taking? Authorizing Provider  co-enzyme Q-10 50 MG capsule Take 50 mg by mouth daily.    Historical Provider, MD  diphenhydrAMINE (BENADRYL) 25 MG tablet Take 2 tablets (50 mg total) by mouth every 6 (six) hours as needed for itching or allergies (bee sting). 03/17/13   Evie Lacks Plotnikov, MD  EPINEPHrine 0.3 mg/0.3 mL IJ SOAJ injection Inject 0.3 mg into the muscle once.    Historical Provider, MD  GLUCOSAMINE-CHONDROITIN PO Take 1 tablet by mouth daily.      Historical Provider, MD  Hyaluronic Acid 20-60 MG CAPS Take 1 tablet by mouth daily.      Historical Provider,  MD  L-ARGININE PO Take 1 capsule by mouth 4 (four) times a week.    Historical Provider, MD  L-CITRULLINE PO Take 1 capsule by mouth 4 (four) times a week.    Historical Provider, MD  Multiple Vitamin (MULTIVITAMIN) capsule Take 1 capsule by mouth daily.      Historical Provider, MD  OVER THE COUNTER MEDICATION Take 1 Dose by mouth 4 (four) times a week. Helps with Testerone.    Historical Provider, MD  Red Yeast Rice 600 MG CAPS Take 1 capsule by mouth daily. Reported on 12/28/2015    Historical Provider, MD  TURMERIC PO Take 1 tablet by mouth daily.       Historical Provider, MD  warfarin (COUMADIN) 5 MG tablet Take 1 tablet (5 mg total) by mouth as directed. 01/27/16   Josue Hector, MD    Family History    Family History  Problem Relation Age of Onset  . Heart attack    . Diabetes    . Diabetes Mother   . Heart disease Father   . Heart attack Father     Social History    Social History   Social History  . Marital Status: Widowed    Spouse Name: N/A  . Number of Children: N/A  . Years of Education: N/A   Occupational History  . Not on file.   Social History Main Topics  . Smoking status: Former Research scientist (life sciences)  . Smokeless tobacco: Not on file  . Alcohol Use: 8.4 oz/week    14 Cans of beer per week  . Drug Use: No  . Sexual Activity: No   Other Topics Concern  . Not on file   Social History Narrative     Review of Systems    General:  No chills, fever, night sweats or weight changes.  Cardiovascular: See HPI Dermatological: No rash, lesions/masses Respiratory: No cough, dyspnea Urologic: No hematuria, dysuria Abdominal:   + nausea, vomiting, diarrhea, bright red blood per rectum, melena, or hematemesis Neurologic:  No visual changes, wkns, changes in mental status. All other systems reviewed and are otherwise negative except as noted above.  Physical Exam    Blood pressure 144/64, pulse 78, resp. rate 12, height 5\' 9"  (1.753 m), weight 181 lb (82.1 kg), SpO2 96 %.  The patient was seen and examined by Dr. Sallyanne Kuster General: Ill-appearing older male, NAD Psych: Normal affect. Neuro: Alert and oriented X 3, slightly lethargic, but easily awoken. Moves all extremities spontaneously. HEENT: Normal  Neck: Supple without bruits or JVD. Lungs:  Resp regular and unlabored, CTA. Heart: Bradycardic, irregular rhythm, no s3, s4, 1/6 systolic murmur. Abdomen: Soft, non-tender, non-distended, BS + x 4.  Extremities: No clubbing, cyanosis or edema. DP/PT/Radials 2+ and equal bilaterally.  Labs    Troponin (Point of Care  Test) No results for input(s): TROPIPOC in the last 72 hours.  Recent Labs  02/16/16 1450  CKTOTAL 39*  CKMB 2.5  TROPONINI 0.03*   Lab Results  Component Value Date   WBC 13.3* 02/16/2016   HGB 12.0* 02/16/2016   HCT 35.6* 02/16/2016   MCV 93.4 02/16/2016   PLT 411* 02/16/2016     Recent Labs Lab 02/16/16 1450  NA 131*  K 4.2  CL 103  CO2 21*  BUN 20  CREATININE 0.91  CALCIUM 8.6*  PROT 5.8*  BILITOT 0.8  ALKPHOS 61  ALT 32  AST 17  GLUCOSE 141*   Lab Results  Component Value Date  CHOL 157 02/16/2016   HDL 46 02/16/2016   LDLCALC 100* 02/16/2016   TRIG 57 02/16/2016   No results found for: [facility]   Radiology Studies    Ct Angio Head W Or Wo Contrast  01/31/2016  CLINICAL DATA:  Subarachnoid hemorrhage. No known trauma. On Coumadin for aortic valve replacement. EXAM: CT ANGIOGRAPHY HEAD AND NECK TECHNIQUE: Multidetector CT imaging of the head and neck was performed using the standard protocol during bolus administration of intravenous contrast. Multiplanar CT image reconstructions and MIPs were obtained to evaluate the vascular anatomy. Carotid stenosis measurements (when applicable) are obtained utilizing NASCET criteria, using the distal internal carotid diameter as the denominator. CONTRAST:  50 mL Isovue 370 IV COMPARISON:  CT head 01/31/2016 FINDINGS: CTA NECK Aortic arch: Atherosclerotic calcification aortic arch without aneurysm or dissection. Atherosclerotic calcification proximal great vessels which are widely patent. Lung apices clear. Right carotid system: Right common carotid artery widely patent. Mild atherosclerotic calcification in the right carotid bifurcation without significant stenosis. Left carotid system: Left common carotid artery widely patent. Atherosclerotic calcification of the carotid bifurcation without significant stenosis. Vertebral arteries:Both vertebral arteries are patent to the basilar without significant vertebral stenosis.  Skeleton: Moderately severe cervical spondylosis. No fracture or mass lesion. Other neck: Negative for mass or adenopathy in the neck. CTA HEAD Anterior circulation: Mild atherosclerotic calcification in the cavernous carotid artery bilaterally. No aneurysm in the cavernous carotid. Anterior and middle cerebral arteries patent bilaterally. 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 This is presumably the cause of the symmetric subarachnoid hemorrhage. Posterior circulation: Both vertebral arteries patent to the basilar. PICA patent. Basilar widely patent. Superior cerebellar and posterior cerebral arteries patent bilaterally. 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 This is presumably the cause of acute subarachnoid hemorrhage. No other aneurysm. Mild atherosclerotic disease the carotid bifurcation bilaterally without significant carotid stenosis. No significant vertebral stenosis. These results were called by telephone at the time of interpretation on 01/31/2016 at 10:12 pm to Dr. Wallie Char , who verbally acknowledged these results. Electronically Signed   By: Franchot Gallo M.D.   On: 01/31/2016 22:14   Ct Head Wo Contrast  02/03/2016  CLINICAL DATA:  Continued surveillance subarachnoid hemorrhage. EXAM: CT HEAD WITHOUT CONTRAST TECHNIQUE: Contiguous axial images were obtained from the base of the skull through the vertex without intravenous contrast. COMPARISON:  Multiple priors. FINDINGS: The patient has undergone endovascular treatment with coil embolization of an anterior communicating artery aneurysm. Coil mass appears compact without migration. Resolution of previously noted blood in the basilar and interhemispheric cisterns. Slight layering hemorrhage in the occipital horns of the lateral ventricles but no intervening hydrocephalus, and no new subarachnoid  blood. No visible cerebral infarction due to vasospasm. Mild sinus fluid predominantly sphenoid, likely due to recumbency. No mastoid fluid. Calvarium intact. Negative orbits. IMPRESSION: Satisfactory appearance status post coiling of an ACom aneurysm. No postprocedural complications.  No intervening hydrocephalus. Electronically Signed   By: Staci Righter M.D.   On: 02/03/2016 11:16   Ct Head Wo Contrast  01/31/2016  CLINICAL DATA:  Sudden onset of headaches EXAM: CT HEAD WITHOUT CONTRAST TECHNIQUE: Contiguous axial images were obtained from the base of the skull through the vertex without intravenous contrast. COMPARISON:  None. FINDINGS: Bony calvarium is intact. There are changes consistent with subarachnoid hemorrhage along the distribution of the middle cerebral arteries and  anterior cerebral arteries bilaterally. Given the patient's clinical history these changes are consistent with ruptured aneurysm. No intraventricular hemorrhage is noted. The basilar cisterns are not filled with blood. No mass lesion is noted. No acute infarct is seen. Mild atrophic changes are noted. IMPRESSION: Subarachnoid hemorrhage as described above. A definitive aneurysm is not noted on this exam. Critical Value/emergent results were called by telephone at the time of interpretation on 01/31/2016 at 9:28 pm to Dr. Nicole Kindred, who verbally acknowledged these results. Electronically Signed   By: Inez Catalina M.D.   On: 01/31/2016 21:30   Ct Angio Neck W Or Wo Contrast  01/31/2016  CLINICAL DATA:  Subarachnoid hemorrhage. No known trauma. On Coumadin for aortic valve replacement. EXAM: CT ANGIOGRAPHY HEAD AND NECK TECHNIQUE: Multidetector CT imaging of the head and neck was performed using the standard protocol during bolus administration of intravenous contrast. Multiplanar CT image reconstructions and MIPs were obtained to evaluate the vascular anatomy. Carotid stenosis measurements (when applicable) are obtained utilizing NASCET  criteria, using the distal internal carotid diameter as the denominator. CONTRAST:  50 mL Isovue 370 IV COMPARISON:  CT head 01/31/2016 FINDINGS: CTA NECK Aortic arch: Atherosclerotic calcification aortic arch without aneurysm or dissection. Atherosclerotic calcification proximal great vessels which are widely patent. Lung apices clear. Right carotid system: Right common carotid artery widely patent. Mild atherosclerotic calcification in the right carotid bifurcation without significant stenosis. Left carotid system: Left common carotid artery widely patent. Atherosclerotic calcification of the carotid bifurcation without significant stenosis. Vertebral arteries:Both vertebral arteries are patent to the basilar without significant vertebral stenosis. Skeleton: Moderately severe cervical spondylosis. No fracture or mass lesion. Other neck: Negative for mass or adenopathy in the neck. CTA HEAD Anterior circulation: Mild atherosclerotic calcification in the cavernous carotid artery bilaterally. No aneurysm in the cavernous carotid. Anterior and middle cerebral arteries patent bilaterally. 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 This is presumably the cause of the symmetric subarachnoid hemorrhage. Posterior circulation: Both vertebral arteries patent to the basilar. PICA patent. Basilar widely patent. Superior cerebellar and posterior cerebral arteries patent bilaterally. 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 This is presumably the cause of acute subarachnoid hemorrhage. No other aneurysm. Mild atherosclerotic disease the carotid bifurcation bilaterally without significant carotid stenosis. No significant vertebral stenosis. These results were called by telephone at the time of interpretation on 01/31/2016 at 10:12 pm to Dr. Wallie Char , who verbally acknowledged  these results. Electronically Signed   By: Franchot Gallo M.D.   On: 01/31/2016 22:14   Dg Chest Port 1 View  02/08/2016  CLINICAL DATA:  Dyspnea and lethargy for 1 day, history hypertension, atrial fibrillation, aortic stenosis post AVR, prostate cancer EXAM: PORTABLE CHEST 1 VIEW COMPARISON:  Portable exam 1953 hours compared to 02/05/2016 FINDINGS: Enlargement of cardiac silhouette post median sternotomy and AVR. Atherosclerotic calcification aortic arch. Mediastinal contours and pulmonary vascularity normal. Bibasilar atelectasis with improved aeration at LEFT base since previous exam. Tiny LEFT pleural effusion. Improved RIGHT upper lobe infiltrate. No pneumothorax. Degenerative changes RIGHT AC joint. IMPRESSION: Bibasilar atelectasis and tiny LEFT pleural effusion. Improved RIGHT upper lobe infiltrate. Improved aeration since previous exam. Enlargement of cardiac silhouette post AVR. Electronically Signed   By: Lavonia Dana M.D.   On: 02/08/2016 20:04   ECG & Cardiac Imaging    EKG: Initial EKG junctional rhythm Rate-30, Repeat EKG  showing A-fib with STEMI in inferior leads  ECHO: 02/04/2016  Study Conclusions  - Left ventricle: The cavity size was normal. Wall thickness was  increased in a pattern of mild LVH. Systolic function was normal.  The estimated ejection fraction was in the range of 55% to 60%.  Wall motion was normal; there were no regional wall motion  abnormalities. - Aortic valve: A bioprosthesis was present and functioning  normally. The prosthesis had a normal range of motion. There was  mild regurgitation. Valve area (VTI): 1.77 cm^2. Valve area  (Vmax): 1.73 cm^2. Valve area (Vmean): 1.66 cm^2. - Mitral valve: There was mild regurgitation. - Left atrium: The atrium was severely dilated. - Right atrium: The atrium was mildly to moderately dilated. - Tricuspid valve: There was moderate regurgitation. - Pulmonary arteries: Systolic pressure was moderately  increased.  PA peak pressure: 51 mm Hg (S).  Assessment & Plan    [name] is a 82 yo male with PMH of chronic a-fib (on coumadin)/AS with biological valve replacement/HTN and HLD who is s/p acute SAH 01/31/16 and had his coumadin reversed and underwent a coil embolization 02/01/2016. He has been receiving inpatient rehab, and has been started back on Eliquis. Appears his last LHC was in 2004 which showed only 70% ost stenosis to first marginal branch. Family reports he has been feeling nauseated over the past 2 days. This afternoon was began to feel weak and experienced pre-syncope and worsening nausea. Rapid response was called to the bedside and noted to be bradycardic and hypotensive. He was pale, cool and clammy. Bp noted 71/37. Stat EKG done which showed a junctional rhythm at a rate of 30. He was placed on a zoll monitor, and cardiology was consulted. Dr. Sallyanne Kuster saw the patient in consultation.   1. STEMI: He was taken emergently to the cath lab with Dr. Martinique. Further recommendations to follow post procedure.   Barnet Pall, NP-C Pager [phone] 02/16/2016, 4:44 PM   I have seen and examined the patient along with Reino Bellis, NP-C.  I have reviewed the chart, notes and new data.  I agree with NP's note.  Key new complaints: off/on nausea for last 2 days, without chest pain. Today had sudden nausea and presyncope sitting up in bed with hypotension and marked bradycardia (idioventricular escape rhythm on background of longstanding permanent atrial fibrillation). BP recovered promptly with supine position. After low dose atropine, had return of AV conduction and gradual development of inferior ST segment elevation (still without nausea). Denies headache or neurological symptoms. No dyspnea. Not on chronic AV blocking agents. Received Eliquis and ASA 325 mg this AM> Key examination changes: irregular rhythm, faint Ao ejection murmur, no diastolic murmurs, no gallop or  rub, warm extremities Key new findings / data: labs pending; ECG: idioventricular 30 bpm ---> atrial fibrillation with slow response,  2 mm upwardly convex ST elevation  II, III, F.  PLAN: Emergency PCI/stent for inferior wall STEMi with secondary high grade AV block. Increased risk of bleeding complications due to concomitant Eliquis. Consider bare metal stent if angiographically appropriate due to need for chronic anticoagulation. Family at bedside. This procedure has been fully reviewed with the patient and informed consent has been obtained.   Sanda Klein, MD, [place] [phone] 02/16/2016, 4:44 PM

## 2016-02-16 NOTE — Progress Notes (Addendum)
Subjective/Complaints: Patient stated that he had to void about 5 times an hour last night, complains of burning with urination. No blood in urine noted. Patient denies prior history of UTI.He did have Foley catheter during acute care hospitalization.   Review of systems negative for chest pain, shortness breath, nausea, vomiting, constipation, diarrhea  Objective: Vital Signs: Blood pressure 151/67, pulse 75, temperature 98.6 F (37 C), temperature source Oral, resp. rate 17, height 5' 9" (1.753 m), weight 88.5 kg (195 lb 1.7 oz), SpO2 97 %. No results found. Results for orders placed or performed during the hospital encounter of 02/13/16 (from the past 72 hour(s))  Comprehensive metabolic panel     Status: Abnormal   Collection Time: 02/14/16  6:39 AM  Result Value Ref Range   Sodium 132 (L) 135 - 145 mmol/L   Potassium 4.0 3.5 - 5.1 mmol/L   Chloride 100 (L) 101 - 111 mmol/L   CO2 25 22 - 32 mmol/L   Glucose, Bld 124 (H) 65 - 99 mg/dL   BUN 23 (H) 6 - 20 mg/dL   Creatinine, Ser 0.75 0.61 - 1.24 mg/dL   Calcium 8.8 (L) 8.9 - 10.3 mg/dL   Total Protein 6.2 (L) 6.5 - 8.1 g/dL   Albumin 3.2 (L) 3.5 - 5.0 g/dL   AST 23 15 - 41 U/L   ALT 53 17 - 63 U/L   Alkaline Phosphatase 65 38 - 126 U/L   Total Bilirubin 1.0 0.3 - 1.2 mg/dL   GFR calc non Af Amer >60 >60 mL/min   GFR calc Af Amer >60 >60 mL/min    Comment: (NOTE) The eGFR has been calculated using the CKD EPI equation. This calculation has not been validated in all clinical situations. eGFR's persistently <60 mL/min signify possible Chronic Kidney Disease.    Anion gap 7 5 - 15  CBC WITH DIFFERENTIAL     Status: Abnormal   Collection Time: 02/14/16  6:39 AM  Result Value Ref Range   WBC 10.0 4.0 - 10.5 K/uL   RBC 4.00 (L) 4.22 - 5.81 MIL/uL   Hemoglobin 12.7 (L) 13.0 - 17.0 g/dL   HCT 37.4 (L) 39.0 - 52.0 %   MCV 93.5 78.0 - 100.0 fL   MCH 31.8 26.0 - 34.0 pg   MCHC 34.0 30.0 - 36.0 g/dL   RDW 13.0 11.5 - 15.5 %   Platelets 373 150 - 400 K/uL   Neutrophils Relative % 75 %   Neutro Abs 7.5 1.7 - 7.7 K/uL   Lymphocytes Relative 12 %   Lymphs Abs 1.2 0.7 - 4.0 K/uL   Monocytes Relative 10 %   Monocytes Absolute 1.0 0.1 - 1.0 K/uL   Eosinophils Relative 3 %   Eosinophils Absolute 0.3 0.0 - 0.7 K/uL   Basophils Relative 0 %   Basophils Absolute 0.0 0.0 - 0.1 K/uL     HEENT: normal Cardio: irregular and mechanical click Resp: CTA B/L and unlabored GI: BS positive and nontender nondistended Extremity:  No Edema Skin:   Intact Neuro: Alert/Oriented, Lethargic, Normal Sensory, Normal Motor and Abnormal FMC Ataxic/ dec FMC Musc/Skel:  Other mild pain with left knee range of motion, left leg length discrepancy shorter than right Gen. no acute distress   Assessment/Plan: 1. Functional deficits secondary to subarachnoid hemorrhage secondary to ACA aneurysm rupture with decreased gait, decreased cognition, decreased fine motor which require 3+ hours per day of interdisciplinary therapy in a comprehensive inpatient rehab setting. Physiatrist is providing close team  supervision and 24 hour management of active medical problems listed below. Physiatrist and rehab team continue to assess barriers to discharge/monitor patient progress toward functional and medical goals. FIM: Function - Bathing Bathing activity did not occur: Refused Position: Shower Body parts bathed by patient: Right arm, Left arm, Chest, Abdomen, Front perineal area, Buttocks, Right upper leg, Right lower leg, Left upper leg, Left lower leg Body parts bathed by helper: Back Assist Level: Touching or steadying assistance(Pt > 75%)  Function- Upper Body Dressing/Undressing What is the patient wearing?: Button up shirt Button up shirt - Perfomed by patient: Thread/unthread right sleeve, Thread/unthread left sleeve, Pull shirt around back, Button/unbutton shirt Button up shirt - Perfomed by helper: Pull shirt around back, Button/unbutton  shirt Assist Level: Supervision or verbal cues Function - Lower Body Dressing/Undressing What is the patient wearing?: Pants Position: Wheelchair/chair at sink Underwear - Performed by patient: Thread/unthread right underwear leg, Thread/unthread left underwear leg, Pull underwear up/down Pants- Performed by patient: Thread/unthread right pants leg, Thread/unthread left pants leg, Pull pants up/down, Fasten/unfasten pants Non-skid slipper socks- Performed by patient: Don/doff right sock, Don/doff left sock Non-skid slipper socks- Performed by helper: Don/doff right sock, Don/doff left sock Assist for footwear: Maximal assist Assist for lower body dressing: Touching or steadying assistance (Pt > 75%)  Function - Toileting Toileting steps completed by patient: Adjust clothing prior to toileting, Performs perineal hygiene, Adjust clothing after toileting Assist level: Touching or steadying assistance (Pt.75%)  Function - Toilet Transfers Toilet transfer assistive device: Elevated toilet seat/BSC over toilet Assist level to toilet: Touching or steadying assistance (Pt > 75%) Assist level from toilet: Touching or steadying assistance (Pt > 75%)  Function - Chair/bed transfer Chair/bed transfer method: Stand pivot Chair/bed transfer assist level: Touching or steadying assistance (Pt > 75%)  Function - Locomotion: Wheelchair Will patient use wheelchair at discharge?: No Type: Manual Assist Level: Dependent (Pt equals 0%) Function - Locomotion: Ambulation Assistive device: Hand held assist Max distance: 50 ft Assist level: Touching or steadying assistance (Pt > 75%) Assist level: Touching or steadying assistance (Pt > 75%) Assist level: Touching or steadying assistance (Pt > 75%)  Function - Comprehension Comprehension: Auditory Comprehension assistive device: Hearing aids Comprehension assist level: Follows basic conversation/direction with extra time/assistive device  Function -  Expression Expression: Verbal Expression assist level: Expresses complex ideas: With extra time/assistive device  Function - Social Interaction Social Interaction assist level: Interacts appropriately 90% of the time - Needs monitoring or encouragement for participation or interaction.  Function - Problem Solving Problem solving assist level: Solves basic problems with no assist  Function - Memory Memory assist level: Recognizes or recalls 75 - 89% of the time/requires cueing 10 - 24% of the time Patient normally able to recall (first 3 days only): Current season, Staff names and faces, That he or she is in a hospital   Medical Problem List and Plan: 1. Unsteady gait and LOB with activity secondary to [facility] and ACA aneurysm s/p coiling.patient .continue CIR PT, OT, speech therapy 2. S/p AVR/DVT Prophylaxis/Anticoagulation: coumadin d/c by NS. Now on Pharmaceutical: Other (comment)-Eliquis 3. Pain Management: Tylenol or ultram prn 4. Mood: LCSW to follow for evaluation and support.  5. Neuropsych: This patient is capable of making decisions on his own behalf. 6. Skin/Wound Care: Routine pressure relief measures.  7. Fluids/Electrolytes/Nutrition: monitor I/O. Check lytes in am. 8. A Fib: Monitor HR bid. On Eliquis and ASA.  9. Headaches: Monitor for now. May need scheduled medication if not controlled  by prn. vascular headaches on Topamax, improved on 50 mg twice a day 10 HTN: Monitor BP bid. Continue Norvasc daily.  11. Mild hyponatremia will liberalize Na in diet, no longer complaining of diet 12. Urinary urgency and frequency probable UTI Will check UA C&S. If UA positive would start empiric antibiotics until culture back    LOS (Days) 3 A FACE TO FACE EVALUATION WAS PERFORMED  Oscar Hank E 02/16/2016, 10:38 AM   Hypotensive episode as well as bradycardia. Patient brought back to room. EKG showed junctional rhythm rate of 30.Patient was given fluid bolus with normal  saline. I evaluated patient he denied any chest pain or shortness of breath. He had no abdominal pain. She appeared  In no acute distress but tired.  O2 via mask Lungs clear Heart bradycardia no murmurs or extra sounds Abdomen positive bowel sounds soft nontender Grip strength is normal bilaterally. Lower extremitiesable to wiggle toes bilaterally.  Impression cardiac arrhythmia with hypotension. Has bradycardia question sick sinus.  Will ask cardiology to evaluate. At this point he is not able to anticipate incompetence of intensive inpatient debilitation program. Discussed with patient as well as his family. Plan on readmitting to rehabilitation after stabilization  In CCU

## 2016-02-16 NOTE — Progress Notes (Signed)
Speech Language Pathology Daily Session Note  Patient Details  Name: Jason Hawkins MRN: [id number] Date of Birth: [birth year]  Today's Date: 02/16/2016 SLP Individual Time: 0900-1000 SLP Individual Time Calculation (min): 60 min  Short Term Goals: Week 1: SLP Short Term Goal 1 (Week 1): Pt will sustian attention to basic self-care tasks for 2-3 minutes with Min verbal cues for redirection.   SLP Short Term Goal 2 (Week 1): Pt will utilize word retreival strategies during moments of anomia in expression of daily information, needs, and wants with Min verbal cues. SLP Short Term Goal 3 (Week 1): Patient will utilize external aids to assist with reall of daily and new information with Supervision level verbal cues. SLP Short Term Goal 4 (Week 1): Patient will solve basic problems with Supervision level verbal cues.  Skilled Therapeutic Interventions: Skilled treatment session focused on cognitive goals. SLP facilitated session by administering the MoCA (version 8.1). Patient scored 25/30 points with a score of 26 or above considered normal. Patient demonstrated deficits in the areas of thought organization, recall and attention. Suspect function impacted by fatigue and nausea. RN made aware. Patient independently requested to write new information down to maximize recall/carryover. Patient transferred back to bed at end of session and left with all needs within reach. Continue with current plan of care.     Function:  Cognition Comprehension Comprehension assist level: Follows basic conversation/direction with extra time/assistive device  Expression   Expression assist level: Expresses complex ideas: With extra time/assistive device  Social Interaction Social Interaction assist level: Interacts appropriately 90% of the time - Needs monitoring or encouragement for participation or interaction.  Problem Solving Problem solving assist level: Solves basic problems with no assist  Memory Memory  assist level: Recognizes or recalls 75 - 89% of the time/requires cueing 10 - 24% of the time    Pain No/Denies Pain   Therapy/Group: Individual Therapy  Richie Vadala, [place] 02/16/2016, 3:59 PM

## 2016-02-16 NOTE — Progress Notes (Signed)
CRITICAL VALUE ALERT  Critical value received: Trop I  Date of notification:  02/16/16  Time of notification:  [id number]  Critical value read back:Yes.    Nurse who received alert:  JL  MD notified (1st page):  Croitoru  Time of first page:  1830  MD notified (2nd page):  Time of second page:  Responding MD:  [id number]  Time MD responded:  Croitoru

## 2016-02-16 NOTE — Progress Notes (Signed)
Patient was complaining about nausea and weakness, blood pressure dropped and Rapid response was called.Rehab Dr and Cardiologist assessed the patient and decided ( after EKG ) to move the patient to cath lab .

## 2016-02-17 ENCOUNTER — Inpatient Hospital Stay (HOSPITAL_COMMUNITY): Payer: Medicare Other

## 2016-02-17 ENCOUNTER — Inpatient Hospital Stay (HOSPITAL_COMMUNITY): Payer: Medicare Other | Admitting: Physical Therapy

## 2016-02-17 ENCOUNTER — Inpatient Hospital Stay (HOSPITAL_COMMUNITY): Payer: Medicare Other | Admitting: Occupational Therapy

## 2016-02-17 ENCOUNTER — Encounter (HOSPITAL_COMMUNITY): Payer: Self-pay | Admitting: Cardiology

## 2016-02-17 DIAGNOSIS — I213 ST elevation (STEMI) myocardial infarction of unspecified site: Secondary | ICD-10-CM

## 2016-02-17 LAB — BASIC METABOLIC PANEL
ANION GAP: 7 (ref 5–15)
BUN: 15 mg/dL (ref 6–20)
CALCIUM: 8.8 mg/dL — AB (ref 8.9–10.3)
CHLORIDE: 104 mmol/L (ref 101–111)
CO2: 23 mmol/L (ref 22–32)
Creatinine, Ser: 0.84 mg/dL (ref 0.61–1.24)
GFR calc non Af Amer: >60 mL/min (ref >60)
Glucose, Bld: 122 mg/dL — ABNORMAL HIGH (ref 65–99)
Potassium: 3.8 mmol/L (ref 3.5–5.1)
Sodium: 134 mmol/L — ABNORMAL LOW (ref 135–145)

## 2016-02-17 LAB — ECHOCARDIOGRAM LIMITED
Height: 69 in
WEIGHTICAEL: 2895.96 [oz_av]

## 2016-02-17 LAB — HEMOGLOBIN A1C
HEMOGLOBIN A1C: 5.6 % (ref 4.8–5.6)
Mean Plasma Glucose: 114 mg/dL

## 2016-02-17 LAB — TROPONIN I
TROPONIN I: 30.84 ng/mL — AB (ref <0.03)
TROPONIN I: 29.97 ng/mL — AB (ref <0.03)

## 2016-02-17 LAB — CBC
HCT: 36.3 % — ABNORMAL LOW (ref 39.0–52.0)
HEMOGLOBIN: 12.1 g/dL — AB (ref 13.0–17.0)
MCH: 31.5 pg (ref 26.0–34.0)
MCHC: 33.3 g/dL (ref 30.0–36.0)
MCV: 94.5 fL (ref 78.0–100.0)
Platelets: 368 10*3/uL (ref 150–400)
RBC: 3.84 MIL/uL — AB (ref 4.22–5.81)
RDW: 13.2 % (ref 11.5–15.5)
WBC: 10.2 10*3/uL (ref 4.0–10.5)

## 2016-02-17 MED ORDER — APIXABAN 2.5 MG PO TABS
2.5000 mg | ORAL_TABLET | Freq: Two times a day (BID) | ORAL | Status: DC
Start: 2016-02-17 — End: 2016-02-20
  Administered 2016-02-17 – 2016-02-20 (×7): 2.5 mg via ORAL
  Filled 2016-02-17 (×7): qty 1

## 2016-02-17 MED ORDER — PERFLUTREN LIPID MICROSPHERE
INTRAVENOUS | Status: AC
  Administered 2016-02-17: 14:00:00
  Filled 2016-02-17: qty 10

## 2016-02-17 MED ORDER — ATORVASTATIN CALCIUM 80 MG PO TABS
80.0000 mg | ORAL_TABLET | Freq: Every day | ORAL | Status: DC
  Administered 2016-02-17 – 2016-02-20 (×4): 80 mg via ORAL
  Filled 2016-02-17 (×4): qty 1

## 2016-02-17 MED ORDER — PERFLUTREN LIPID MICROSPHERE: 1.0000 mL | INTRAVENOUS | Status: DC | PRN

## 2016-02-17 MED ORDER — LISINOPRIL 2.5 MG PO TABS
2.5000 mg | ORAL_TABLET | Freq: Every day | ORAL | Status: DC
  Administered 2016-02-17 – 2016-02-20 (×4): 2.5 mg via ORAL
  Filled 2016-02-17 (×4): qty 1

## 2016-02-17 MED ORDER — PERFLUTREN LIPID MICROSPHERE
1.0000 mL | INTRAVENOUS | Status: AC | PRN
  Filled 2016-02-17: qty 10

## 2016-02-17 MED FILL — Nitroglycerin IV Soln 100 MCG/ML in D5W: INTRA_ARTERIAL | Qty: 10 | Status: AC

## 2016-02-17 NOTE — Progress Notes (Signed)
Jason Hawkins paid a follow up visit with patient. Daughter-inn-law was at bedside. The patient was in good spirits and feeling much better after his procedure. I provided the ministry of presence and conversation. I am available for follow up as needed.  Jason Hawkins    02/17/16 1000  Clinical Encounter Type  Visited With Patient;Patient and family together  Visit Type Follow-up;Spiritual support  Consult/Referral To Nurse  Spiritual Encounters  Spiritual Needs Prayer;Emotional

## 2016-02-17 NOTE — Progress Notes (Signed)
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 No bleeding, no new neurological complaints. Feels stronger than he has felt for several days before the acute event.  CURRENT MEDS . aspirin  81 mg Oral Daily  . cefTRIAXone (ROCEPHIN)  IV  1 g Intravenous Q24H  . clopidogrel  75 mg Oral Q breakfast  . sodium chloride flush  3 mL Intravenous Q12H    OBJECTIVE   Intake/Output Summary (Last 24 hours) at 02/17/16 0925 Last data filed at 02/17/16 0500  Gross per 24 hour  Intake 1296.22 ml  Output   2100 ml  Net -803.78 ml   Filed Weights   02/16/16 1624  Weight: 82.1 kg (181 lb)    PHYSICAL EXAM Filed Vitals:   02/17/16 0600 02/17/16 0700 02/17/16 0736 02/17/16 0800  BP: 138/64   133/58  Pulse: 63 72  76  Temp:   98 F (36.7 C)   TempSrc:   Oral   Resp: 20 28  16   Height:      Weight:      SpO2: 98% 99%  98%   General: Alert, oriented x3, no distress Head: no evidence of trauma, PERRL, EOMI, no exophtalmos or lid lag, no myxedema, no xanthelasma; normal ears, nose and oropharynx Neck: normal jugular venous pulsations and no hepatojugular reflux; brisk carotid pulses without delay and no carotid bruits Chest: clear to auscultation, no signs of consolidation by percussion or palpation, normal fremitus, symmetrical and full respiratory excursions Cardiovascular: normal position and quality of the apical impulse, irregular rhythm,  normal first and second heart sounds, no rubs or gallops, no murmur Abdomen: no tenderness or distention, no masses by palpation, no abnormal pulsatility or arterial bruits, normal bowel sounds, no hepatosplenomegaly Extremities: no clubbing, cyanosis or edema; healthy radial access site; 2+ radial, ulnar and brachial pulses bilaterally; 2+ right femoral, posterior tibial and dorsalis pedis pulses; 2+ left femoral, posterior tibial and dorsalis pedis pulses; no subclavian or femoral bruits Neurological: grossly nonfocal  LABS  CBC  Recent Labs  02/16/16 1450 02/17/16 0312  WBC 13.3* 10.2  NEUTROABS 10.8*  --   HGB 12.0* 12.1*  HCT 35.6* 36.3*  MCV 93.4 94.5  PLT 411* [id number]   Basic Metabolic Panel  Recent Labs  02/16/16 1450 02/17/16 0312  NA 131* 134*  K 4.2 3.8  CL 103 104  CO2 21* 23  GLUCOSE 141* 122*  BUN 20 15  CREATININE 0.91 0.84  CALCIUM 8.6* 8.8*   Liver Function Tests  Recent Labs  02/16/16 1450  AST 17  ALT 32  ALKPHOS 61  BILITOT 0.8  PROT 5.8*  ALBUMIN 3.0*   No results for input(s): LIPASE, AMYLASE in the last 72 hours. Cardiac Enzymes  Recent Labs  02/16/16 1450 02/16/16 1729 02/16/16 2236 02/17/16 0312  CKTOTAL 39*  --   --   --   CKMB 2.5  --   --   --   TROPONINI 0.03* 10.64* 30.84*  29.97*   BNP Invalid input(s): POCBNP D-Dimer No results for input(s): DDIMER in the last 72 hours. Hemoglobin A1C  Recent Labs  02/16/16 1450  HGBA1C 5.6   Fasting Lipid Panel  Recent Labs  02/16/16 1450  CHOL 157  HDL 46  LDLCALC 100*  TRIG 57  CHOLHDL 3.4    Radiology Studies Imaging results have been reviewed and No results found.  TELE AF with controlled rate  ECG AFib, ST segments isoelectric, no Q waves  ASSESSMENT AND PLAN  1. S/P acute inferior wall STEMI with emergency PCI (DES to distal LCX) - clopidogrel x 12 months (no aspirin due to use of Eliquis and recent SAH) - no beta blockers due to spontaneously slow  ventricular rate in AF - low dose ACEi; titrate up if LVEF is depressed on echo  2. Transient 3rd degree AV block due to inferior MI, resolved  3. Chronic atrial fibrillation - high stroke risk, resume Eliquis   4. S/P SAH - I think he should be ready to resume rehab as early as tomorrow, will ask Dr. Ella Bodo' opinion re: readmission to inpatient rehab.  5. Hyponatremia, almost resolved.   Sanda Klein, MD, [facility] [phone] office [phone] pager 02/17/2016 9:25 AM

## 2016-02-17 NOTE — Progress Notes (Signed)
02/17/2016 1:47 PM Nursing note Notified by Echo Tech need for Definity to complete ordered 2 D echo. Orders for Definity placed per protocol. Will continue to closely monitor patient.  Koraima Albertsen, [place]

## 2016-02-17 NOTE — Progress Notes (Signed)
CARDIAC REHAB PHASE I   PRE:  Rate/Rhythm: 78 afib    BP: sitting 131/87    SaO2: 98 RA  MODE:  Ambulation: 130 ft   POST:  Rate/Rhythm: 87 SR    BP: sitting 128/60     SaO2: 98 RA  Pt anxious in general, reluctant to walk. Able to stand with min assist x2, gait belt. Used RW for stability. Was doing well in hall until he urgently had to have BM. Sat pt in recliner and rolled back to room. Denied any other c/o walking. To recliner and ed completed with pt and daughter in law. Voiced understanding and requests his referral be sent to [place] for in about 4-6 weeks from now. [id number]   [place], ACSM 02/17/2016 10:35 AM

## 2016-02-17 NOTE — Progress Notes (Signed)
  Echocardiogram 2D Echocardiogram has been performed with definity.  [name] 02/17/2016, 2:03 PM

## 2016-02-17 NOTE — Progress Notes (Signed)
Pt from cir. Gave pt 30day free eliquis card.

## 2016-02-17 NOTE — Progress Notes (Signed)
ANTICOAGULATION CONSULT NOTE - Initial Consult  Pharmacy Consult for Apixaban Indication: atrial fibrillation  Allergies  Allergen Reactions  . Bee Venom Anaphylaxis  . Codeine     nausea    Patient Measurements: Height: 5\' 9"  (175.3 cm) Weight: 181 lb (82.1 kg) IBW/kg (Calculated) : 70.7  Vital Signs: Temp: 98 F (36.7 C) (07/14 0736) Temp Source: Oral (07/14 0736) BP: 133/58 mmHg (07/14 0800) Pulse Rate: 76 (07/14 0800)  Labs:  Recent Labs  02/16/16 1447  02/16/16 1450 02/16/16 1729 02/16/16 2236 02/17/16 0312  HGB 12.2*  --  12.0*  --   --  12.1*  HCT 36.0*  --  35.6*  --   --  36.3*  PLT  --   --  411*  --   --  368  CREATININE 0.80  --  0.91  --   --  0.84  CKTOTAL  --   --  39*  --   --   --   CKMB  --   --  2.5  --   --   --   TROPONINI  --   < > 0.03* 10.64* 30.84* 29.97*  < > = values in this interval not displayed.  Estimated Creatinine Clearance: 63.1 mL/min (by C-G formula based on Cr of 0.84).   Medical History: Past Medical History  Diagnosis Date  . Chronic atrial fibrillation ([place])   . Severe aortic stenosis     s/p aortic valve replacement using a pericardial tissue valve  . HTN (hypertension)   . Lower extremity edema   . Hypercholesteremia    Assessment: 88yom previously on coumadin for afib, developed [facility] 6/27, was reversed and underwent coil embolization 6/28. He was then transitioned to low dose apixaban. Yesterday he had emergency PCI for STEMI and received DES to his distal Cx - now on plavix. Given his high stroke risk, apixaban to be resumed today.  Goal of Therapy:  Monitor platelets by anticoagulation protocol: Yes   Plan:  1) Resume apixaban 2.5mg  bid  Deboraha Sprang 02/17/2016,9:57 AM

## 2016-02-18 ENCOUNTER — Encounter (HOSPITAL_COMMUNITY): Payer: Self-pay | Admitting: *Deleted

## 2016-02-18 LAB — BASIC METABOLIC PANEL
Anion gap: 6 (ref 5–15)
BUN: 16 mg/dL (ref 6–20)
CHLORIDE: 107 mmol/L (ref 101–111)
CO2: 22 mmol/L (ref 22–32)
CREATININE: 0.88 mg/dL (ref 0.61–1.24)
Calcium: 8.8 mg/dL — ABNORMAL LOW (ref 8.9–10.3)
GFR calc non Af Amer: >60 mL/min (ref >60)
Glucose, Bld: 151 mg/dL — ABNORMAL HIGH (ref 65–99)
POTASSIUM: 4 mmol/L (ref 3.5–5.1)
Sodium: 135 mmol/L (ref 135–145)

## 2016-02-18 NOTE — Progress Notes (Signed)
Patient ID: Jason Hawkins, male   DOB: 01-09-1928, 82 y.o.   MRN: [id number]   SUBJECTIVE: No complaints today, no chest pain.  No dyspnea.  Walked a little with cardiac rehab yesterday.   Scheduled Meds: . apixaban  2.5 mg Oral BID  . atorvastatin  80 mg Oral q1800  . cefTRIAXone (ROCEPHIN)  IV  1 g Intravenous Q24H  . clopidogrel  75 mg Oral Q breakfast  . lisinopril  2.5 mg Oral Daily  . sodium chloride flush  3 mL Intravenous Q12H   Continuous Infusions:  PRN Meds:.sodium chloride, acetaminophen, ondansetron (ZOFRAN) IV, sodium chloride flush    Filed Vitals:   02/18/16 0000 02/18/16 0323 02/18/16 0400 02/18/16 0803  BP: 122/68 148/71 144/83 120/104  Pulse:    82  Temp: 98.1 F (36.7 C) 98.4 F (36.9 C)    TempSrc: Oral Oral    Resp: 18 24 21 24   Height:      Weight:      SpO2: 96% 99% 96% 95%    Intake/Output Summary (Last 24 hours) at 02/18/16 1020 Last data filed at 02/18/16 0400  Gross per 24 hour  Intake    560 ml  Output    950 ml  Net   -390 ml    LABS: Basic Metabolic Panel:  Recent Labs  02/16/16 1450 02/17/16 0312  NA 131* 134*  K 4.2 3.8  CL 103 104  CO2 21* 23  GLUCOSE 141* 122*  BUN 20 15  CREATININE 0.91 0.84  CALCIUM 8.6* 8.8*   Liver Function Tests:  Recent Labs  02/16/16 1450  AST 17  ALT 32  ALKPHOS 61  BILITOT 0.8  PROT 5.8*  ALBUMIN 3.0*   No results for input(s): LIPASE, AMYLASE in the last 72 hours. CBC:  Recent Labs  02/16/16 1450 02/17/16 0312  WBC 13.3* 10.2  NEUTROABS 10.8*  --   HGB 12.0* 12.1*  HCT 35.6* 36.3*  MCV 93.4 94.5  PLT 411* 368   Cardiac Enzymes:  Recent Labs  02/16/16 1450 02/16/16 1729 02/16/16 2236 02/17/16 0312  CKTOTAL 39*  --   --   --   CKMB 2.5  --   --   --   TROPONINI 0.03* 10.64* 30.84* 29.97*   BNP: Invalid input(s): POCBNP D-Dimer: No results for input(s): DDIMER in the last 72 hours. Hemoglobin A1C:  Recent Labs  02/16/16 1450  HGBA1C 5.6   Fasting Lipid  Panel:  Recent Labs  02/16/16 1450  CHOL 157  HDL 46  LDLCALC 100*  TRIG 57  CHOLHDL 3.4   Thyroid Function Tests: No results for input(s): TSH, T4TOTAL, T3FREE, THYROIDAB in the last 72 hours.  Invalid input(s): FREET3 Anemia Panel: No results for input(s): VITAMINB12, FOLATE, FERRITIN, TIBC, IRON, RETICCTPCT in the last 72 hours.  RADIOLOGY: Ct Angio Head W Or Wo Contrast  01/31/2016  CLINICAL DATA:  Subarachnoid hemorrhage. No known trauma. On Coumadin for aortic valve replacement. EXAM: CT ANGIOGRAPHY HEAD AND NECK TECHNIQUE: Multidetector CT imaging of the head and neck was performed using the standard protocol during bolus administration of intravenous contrast. Multiplanar CT image reconstructions and MIPs were obtained to evaluate the vascular anatomy. Carotid stenosis measurements (when applicable) are obtained utilizing NASCET criteria, using the distal internal carotid diameter as the denominator. CONTRAST:  50 mL Isovue 370 IV COMPARISON:  CT head 01/31/2016 FINDINGS: CTA NECK Aortic arch: Atherosclerotic calcification aortic arch without aneurysm or dissection. Atherosclerotic calcification proximal great vessels which are widely  patent. Lung apices clear. Right carotid system: Right common carotid artery widely patent. Mild atherosclerotic calcification in the right carotid bifurcation without significant stenosis. Left carotid system: Left common carotid artery widely patent. Atherosclerotic calcification of the carotid bifurcation without significant stenosis. Vertebral arteries:Both vertebral arteries are patent to the basilar without significant vertebral stenosis. Skeleton: Moderately severe cervical spondylosis. No fracture or mass lesion. Other neck: Negative for mass or adenopathy in the neck. CTA HEAD Anterior circulation: Mild atherosclerotic calcification in the cavernous carotid artery bilaterally. No aneurysm in the cavernous carotid. Anterior and middle cerebral  arteries patent bilaterally. 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 This is presumably the cause of the symmetric subarachnoid hemorrhage. Posterior circulation: Both vertebral arteries patent to the basilar. PICA patent. Basilar widely patent. Superior cerebellar and posterior cerebral arteries patent bilaterally. 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 This is presumably the cause of acute subarachnoid hemorrhage. No other aneurysm. Mild atherosclerotic disease the carotid bifurcation bilaterally without significant carotid stenosis. No significant vertebral stenosis. These results were called by telephone at the time of interpretation on 01/31/2016 at 10:12 pm to Dr. Wallie Char , who verbally acknowledged these results. Electronically Signed   By: Franchot Gallo M.D.   On: 01/31/2016 22:14   Ct Head Wo Contrast  02/03/2016  CLINICAL DATA:  Continued surveillance subarachnoid hemorrhage. EXAM: CT HEAD WITHOUT CONTRAST TECHNIQUE: Contiguous axial images were obtained from the base of the skull through the vertex without intravenous contrast. COMPARISON:  Multiple priors. FINDINGS: The patient has undergone endovascular treatment with coil embolization of an anterior communicating artery aneurysm. Coil mass appears compact without migration. Resolution of previously noted blood in the basilar and interhemispheric cisterns. Slight layering hemorrhage in the occipital horns of the lateral ventricles but no intervening hydrocephalus, and no new subarachnoid blood. No visible cerebral infarction due to vasospasm. Mild sinus fluid predominantly sphenoid, likely due to recumbency. No mastoid fluid. Calvarium intact. Negative orbits. IMPRESSION: Satisfactory appearance status post coiling of an ACom aneurysm. No postprocedural complications.  No intervening  hydrocephalus. Electronically Signed   By: Staci Righter M.D.   On: 02/03/2016 11:16   Ct Head Wo Contrast  01/31/2016  CLINICAL DATA:  Sudden onset of headaches EXAM: CT HEAD WITHOUT CONTRAST TECHNIQUE: Contiguous axial images were obtained from the base of the skull through the vertex without intravenous contrast. COMPARISON:  None. FINDINGS: Bony calvarium is intact. There are changes consistent with subarachnoid hemorrhage along the distribution of the middle cerebral arteries and anterior cerebral arteries bilaterally. Given the patient's clinical history these changes are consistent with ruptured aneurysm. No intraventricular hemorrhage is noted. The basilar cisterns are not filled with blood. No mass lesion is noted. No acute infarct is seen. Mild atrophic changes are noted. IMPRESSION: Subarachnoid hemorrhage as described above. A definitive aneurysm is not noted on this exam. Critical Value/emergent results were called by telephone at the time of interpretation on 01/31/2016 at 9:28 pm to Dr. Nicole Kindred, who verbally acknowledged these results. Electronically Signed   By: Inez Catalina M.D.   On: 01/31/2016 21:30   Ct Angio Neck W Or Wo Contrast  01/31/2016  CLINICAL DATA:  Subarachnoid hemorrhage. No known trauma. On Coumadin for aortic valve replacement. EXAM: CT ANGIOGRAPHY HEAD AND NECK TECHNIQUE: Multidetector CT imaging of the head and neck was performed using the standard protocol during bolus administration  of intravenous contrast. Multiplanar CT image reconstructions and MIPs were obtained to evaluate the vascular anatomy. Carotid stenosis measurements (when applicable) are obtained utilizing NASCET criteria, using the distal internal carotid diameter as the denominator. CONTRAST:  50 mL Isovue 370 IV COMPARISON:  CT head 01/31/2016 FINDINGS: CTA NECK Aortic arch: Atherosclerotic calcification aortic arch without aneurysm or dissection. Atherosclerotic calcification proximal great vessels which  are widely patent. Lung apices clear. Right carotid system: Right common carotid artery widely patent. Mild atherosclerotic calcification in the right carotid bifurcation without significant stenosis. Left carotid system: Left common carotid artery widely patent. Atherosclerotic calcification of the carotid bifurcation without significant stenosis. Vertebral arteries:Both vertebral arteries are patent to the basilar without significant vertebral stenosis. Skeleton: Moderately severe cervical spondylosis. No fracture or mass lesion. Other neck: Negative for mass or adenopathy in the neck. CTA HEAD Anterior circulation: Mild atherosclerotic calcification in the cavernous carotid artery bilaterally. No aneurysm in the cavernous carotid. Anterior and middle cerebral arteries patent bilaterally. 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 This is presumably the cause of the symmetric subarachnoid hemorrhage. Posterior circulation: Both vertebral arteries patent to the basilar. PICA patent. Basilar widely patent. Superior cerebellar and posterior cerebral arteries patent bilaterally. 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 This is presumably the cause of acute subarachnoid hemorrhage. No other aneurysm. Mild atherosclerotic disease the carotid bifurcation bilaterally without significant carotid stenosis. No significant vertebral stenosis. These results were called by telephone at the time of interpretation on 01/31/2016 at 10:12 pm to Dr. Wallie Char , who verbally acknowledged these results. Electronically Signed   By: Franchot Gallo M.D.   On: 01/31/2016 22:14   Dg Chest Port 1 View  02/08/2016  CLINICAL DATA:  Dyspnea and lethargy for 1 day, history hypertension, atrial fibrillation, aortic stenosis post AVR, prostate cancer EXAM: PORTABLE CHEST 1 VIEW COMPARISON:   Portable exam 1953 hours compared to 02/05/2016 FINDINGS: Enlargement of cardiac silhouette post median sternotomy and AVR. Atherosclerotic calcification aortic arch. Mediastinal contours and pulmonary vascularity normal. Bibasilar atelectasis with improved aeration at LEFT base since previous exam. Tiny LEFT pleural effusion. Improved RIGHT upper lobe infiltrate. No pneumothorax. Degenerative changes RIGHT AC joint. IMPRESSION: Bibasilar atelectasis and tiny LEFT pleural effusion. Improved RIGHT upper lobe infiltrate. Improved aeration since previous exam. Enlargement of cardiac silhouette post AVR. Electronically Signed   By: Lavonia Dana M.D.   On: 02/08/2016 20:04   Dg Chest Port 1 View  02/05/2016  CLINICAL DATA:  Acute respiratory failure Fever of 100 this morning O2 at 96% EXAM: PORTABLE CHEST - 1 VIEW COMPARISON:  the previous day's study FINDINGS: Slight improvement in the focal right upper and lower lobe airspace disease. Left retrocardiac consolidation/atelectasis persists. Heart size upper limits normal.  Previous CABG and AVR. Small pleural effusions persist. Degenerative changes in the right shoulder. IMPRESSION: 1. Slight improvement in right lung airspace disease. 2. Small effusions with persistent left retrocardiac consolidation/atelectasis. Electronically Signed   By: Lucrezia Europe M.D.   On: 02/05/2016 08:40   Dg Chest Port 1 View  02/04/2016  CLINICAL DATA:  Shortness of Breath EXAM: PORTABLE CHEST 1 VIEW COMPARISON:  10/19/2003 FINDINGS: Cardiac shadow is mildly enlarged. Postsurgical changes are again seen. Diffuse patchy infiltrates are noted throughout the right lung as well as to a lesser degree in the left lung base. No bony abnormality is noted.  IMPRESSION: Bilateral airspace opacities right greater than left. Followup examination is recommended. Electronically Signed   By: Inez Catalina M.D.   On: 02/04/2016 10:11    PHYSICAL EXAM General: NAD Neck: No JVD, no thyromegaly or thyroid  nodule.  Lungs: Clear to auscultation bilaterally with normal respiratory effort. CV: Nondisplaced PMI.  Heart regular S1/S2, no S3/S4, no murmur.  No peripheral edema.  No carotid bruit.  Normal pedal pulses.  Abdomen: Soft, nontender, no hepatosplenomegaly, no distention.  Neurologic: Alert and oriented x 3.  Psych: Normal affect. Extremities: No clubbing or cyanosis.   TELEMETRY: Reviewed telemetry pt in atrial fibrillation in 70s  ASSESSMENT AND PLAN: 82 yo with complicated recent history.  Has chronic atrial fibrillation and had bioprosthetic aortic valve replacement.  He had SAH in 6/17 with coil embolization.  He was in inpatient rehab and had inferior STEMI => DES to distal LCx.   1. CAD: Inferior STEMI, had DES to distal LCx.  No further chest pain.  Echo with EF 60-65%.  - Continue Plavix + low dose apixaban 2.5 mg bid.  No ASA.  - Continue statin.  2. Atrial fibrillation: Chronic, HR not elevated.  Has not been on nodal blockade.  Continue lower dose apixaban while on Plavix.   3. Complete heart block: Transient in setting of inferior MI.  Now resolved.  4. H/o SAH: In 6/17.  S/p coil embolization.   5. Bioprosthetic aortic valve: Functioning normally on echo this admission.  6. May go to telemetry.  Continue PT/cardiac rehab.  Screen to go back to inpatient rehab.   Loralie Champagne 02/18/2016 10:24 AM

## 2016-02-18 NOTE — Progress Notes (Signed)
CARDIAC REHAB PHASE I   PRE:  Rate/Rhythm: 78 afib  BP:  Supine: 123/64  Sitting:   Standing:    SaO2: 97%RA  MODE:  Ambulation: 350 ft   POST:  Rate/Rhythm: 88 afib  BP:  Supine: 138/65  Sitting:   Standing:    SaO2: 99%RA 1200-1225 Pt much stronger today. Pt walked 350 ft on RA with gait belt use, rolling walker and asst x 2 with steady gait. Can be asst x 1. No c/o CP. Back to bed after walk.  Graylon Good, RN BSN  02/18/2016 12:22 PM

## 2016-02-18 NOTE — Progress Notes (Signed)
Patient received from [place], Telemetry was applied, vitals obtained, lunch tray ordered. Patient  Lying in the bed, alert and oriented with no complaints of pain. Will continue to monitor.

## 2016-02-18 NOTE — Evaluation (Signed)
Physical Therapy Evaluation Patient Details Name: Jason Hawkins MRN: [id number] DOB: 11-18-1927 Today's Date: 02/18/2016   History of Present Illness  82 yo male with PMH of chronic a-fib (on coumadin)/AS with biological valve replacement/HTN and HLD who is s/p acute SAH due to aneurysm 01/31/16 and had his coumadin reversed and underwent a coil embolization 02/01/2016; went to inpatient rehab and returned to acute hospital 02/16/16 S/P acute inferior wall STEMI with emergency PCI     Clinical Impression  Pt admitted with above diagnosis. Pt currently with functional limitations due to the deficits listed below (see PT Problem List).  Pt will benefit from skilled PT to increase their independence and safety with mobility to allow discharge to the venue listed below. Per patient and close friend, family has arranged 24/7 assist for 6-8 weeks.     Follow Up Recommendations CIR    Equipment Recommendations  None recommended by PT    Recommendations for Other Services OT consult;Rehab consult     Precautions / Restrictions Precautions Precautions: Fall      Mobility  Bed Mobility Overal bed mobility: Needs Assistance Bed Mobility: Supine to Sit;Sit to Supine     Supine to sit: Supervision Sit to supine: Modified independent (Device/Increase time)   General bed mobility comments: refused chair due to painful tailbone; supervission for safety  Transfers Overall transfer level: Needs assistance Equipment used: None Transfers: Sit to/from Stand Sit to Stand: Min guard         General transfer comment: pt used bed rail to steady himself as coming to tstand  Ambulation/Gait Ambulation/Gait assistance: Min assist Ambulation Distance (Feet): 100 Feet Assistive device: 1 person hand held assist Gait Pattern/deviations: Step-through pattern;Decreased stride length;Decreased weight shift to right;Drifts right/left;Narrow base of support Gait velocity: decreased   General Gait  Details: nearly constant drift/lean to his left; able to incr velocity significantly however chooses slower velocity  Stairs            Wheelchair Mobility    Modified Rankin (Stroke Patients Only)       Balance Overall balance assessment: Needs assistance;History of Falls (fell in bathroom while hospitalized) Sitting-balance support: No upper extremity supported;Feet supported Sitting balance-Leahy Scale: Good     Standing balance support: Single extremity supported Standing balance-Leahy Scale: Poor Standing balance comment: seeks UE support       Tandem Stance - Left Leg: 0 (min assist to obain position and hold 10 sec) [place] - Eyes Opened: 0 (min assist to obtain position; held x 15 sec)   High level balance activites: Direction changes;Turns;Sudden stops;Head turns High Level Balance Comments: min assist with drift to his left             Pertinent Vitals/Pain Pain Assessment: Faces Faces Pain Scale: Hurts little more Pain Location: tail bone Pain Descriptors / Indicators: Grimacing;Guarding Pain Intervention(s): Limited activity within patient's tolerance;Repositioned    Home Living Family/patient expects to be discharged to:: Inpatient rehab                      Prior Function Level of Independence: Independent         Comments: prior to 1st admission     Hand Dominance   Dominant Hand: Right    Extremity/Trunk Assessment   Upper Extremity Assessment: Overall WFL for tasks assessed           Lower Extremity Assessment: Generalized weakness;RLE deficits/detail;LLE deficits/detail RLE Deficits / Details: ankle DF to neutral only LLE Deficits /  Details: ankle DF to neutral only  Cervical / Trunk Assessment: Normal  Communication   Communication: HOH  Cognition Arousal/Alertness: Awake/alert Behavior During Therapy: WFL for tasks assessed/performed Overall Cognitive Status: Impaired/Different from baseline (per long-time  friend present) Area of Impairment: Attention;Memory;Safety/judgement;Awareness   Current Attention Level: Sustained Memory: Decreased short-term memory   Safety/Judgement: Decreased awareness of safety;Decreased awareness of deficits Awareness: Emergent Problem Solving: Requires verbal cues;Requires tactile cues;Slow processing General Comments: has multiple aspects of hospitalization confused (as he attempted to recount)    General Comments General comments (skin integrity, edema, etc.): Reports soreness of tailbone and bil heels; returned to bed per pt request and floated heels    Exercises        Assessment/Plan    PT Assessment Patient needs continued PT services  PT Diagnosis Difficulty walking   PT Problem List Decreased strength;Decreased activity tolerance;Decreased balance;Decreased mobility;Decreased coordination;Decreased safety awareness;Decreased range of motion  PT Treatment Interventions DME instruction;Gait training;Stair training;Functional mobility training;Therapeutic activities;Therapeutic exercise;Balance training;Patient/family education;Cognitive remediation   PT Goals (Current goals can be found in the Care Plan section) Acute Rehab PT Goals Patient Stated Goal: go home PT Goal Formulation: With patient Time For Goal Achievement: 02/25/16 Potential to Achieve Goals: Good    Frequency Min 3X/week   Barriers to discharge        Co-evaluation               End of Session Equipment Utilized During Treatment: Gait belt Activity Tolerance: Patient tolerated treatment well Patient left: in bed;with call bell/phone within reach;with bed alarm set;with family/visitor present           Time: [id number] PT Time Calculation (min) (ACUTE ONLY): 21 min   Charges:   PT Evaluation $PT Eval Moderate Complexity: 1 Procedure     PT G Codes:        Jason Hawkins 03/18/16, 2:25 PM Pager [phone]

## 2016-02-18 NOTE — Progress Notes (Signed)
Transferred to [facility] by bed stable, report given to RN, belongings with pt.

## 2016-02-19 LAB — CBC
HEMATOCRIT: 36 % — AB (ref 39.0–52.0)
Hemoglobin: 11.8 g/dL — ABNORMAL LOW (ref 13.0–17.0)
MCH: 31 pg (ref 26.0–34.0)
MCHC: 32.8 g/dL (ref 30.0–36.0)
MCV: 94.5 fL (ref 78.0–100.0)
PLATELETS: 389 10*3/uL (ref 150–400)
RBC: 3.81 MIL/uL — ABNORMAL LOW (ref 4.22–5.81)
RDW: 13.1 % (ref 11.5–15.5)
WBC: 9.2 10*3/uL (ref 4.0–10.5)

## 2016-02-19 LAB — URINE CULTURE

## 2016-02-19 MED ORDER — PHENAZOPYRIDINE HCL 100 MG PO TABS: 100.0000 mg | ORAL_TABLET | Freq: Three times a day (TID) | ORAL | Status: DC

## 2016-02-19 MED ORDER — PHENAZOPYRIDINE HCL 100 MG PO TABS
100.0000 mg | ORAL_TABLET | Freq: Three times a day (TID) | ORAL | Status: DC
  Administered 2016-02-19 – 2016-02-20 (×5): 100 mg via ORAL
  Filled 2016-02-19 (×5): qty 1

## 2016-02-19 MED ORDER — SODIUM CHLORIDE 0.9 % IV SOLN
1.0000 g | Freq: Four times a day (QID) | INTRAVENOUS | Status: DC
  Administered 2016-02-19 – 2016-02-20 (×6): 1 g via INTRAVENOUS
  Filled 2016-02-19 (×8): qty 1000

## 2016-02-19 NOTE — Progress Notes (Signed)
Subjective:  Major complaint is dysuria and painful urination.  He has been treated for UTI.  No complaints of shortness of breath or chest pain.  Was able to walk yesterday.  Objective:  Vital Signs in the last 24 hours: BP 148/69 mmHg  Pulse 74  Temp(Src) 98.3 F (36.8 C) (Oral)  Resp 20  Ht 5\' 9"  (1.753 m)  Wt 82.8 kg (182 lb 8.7 oz)  BMI 26.94 kg/m2  SpO2 95%  Physical Exam: Elderly male in no acute distress Lungs:  Clear Cardiac:  Irregular rhythm, normal S1 and S2, 1 to 2/6 systolic murmur over aortic valve Abdomen:  Soft, nontender, no masses Extremities:  No edema present  Intake/Output from previous day: 07/15 0701 - 07/16 0700 In: 123 [P.O.:120; I.V.:3] Out: 1360 [Urine:1360]  Weight Filed Weights   02/16/16 1624 02/19/16 0609  Weight: 82.1 kg (181 lb) 82.8 kg (182 lb 8.7 oz)    Lab Results: Basic Metabolic Panel:  Recent Labs  02/17/16 0312 02/18/16 1056  NA 134* 135  K 3.8 4.0  CL 104 107  CO2 23 22  GLUCOSE 122* 151*  BUN 15 16  CREATININE 0.84 0.88   CBC:  Recent Labs  02/16/16 1450 02/17/16 0312 02/19/16 0315  WBC 13.3* 10.2 9.2  NEUTROABS 10.8*  --   --   HGB 12.0* 12.1* 11.8*  HCT 35.6* 36.3* 36.0*  MCV 93.4 94.5 94.5  PLT 411* 368 389   Cardiac Panel (last 3 results)  Recent Labs  02/16/16 1450 02/16/16 1729 02/16/16 2236 02/17/16 0312  CKTOTAL 39*  --   --   --   CKMB 2.5  --   --   --   TROPONINI 0.03* 10.64* 30.84* 29.97*  RELINDX RELATIVE INDEX IS INVALID  --   --   --    Telemetry: Atrial fibrillation with occasional PVCs  Assessment/Plan: 1.  CAD with prior inferior infarction with DES to distal circumflex this admission with no recurrent chest pain 2.  Chronic atrial fibrillation currently rate controlled 3.  History of transient complete heart block in the setting of inferior infarction resolved 4.  Bioprosthetic aortic valve normally functioning 5.  Significant dysuria due to urinary tract  infection  Recommendations:  Add Pyridium 100 mg 3 times a day for dysuria.  Continue to walk and get out of bed.  Screen for rehabilitation tomorrow.  Should be okay to go back to rehabilitation at this point.      Kerry Hough  MD [facility]  02/19/2016, 9:54 AM

## 2016-02-20 ENCOUNTER — Encounter (HOSPITAL_COMMUNITY): Payer: Self-pay | Admitting: *Deleted

## 2016-02-20 ENCOUNTER — Inpatient Hospital Stay (HOSPITAL_COMMUNITY)
Admission: RE | Admit: 2016-02-20 | Discharge: 2016-02-24 | DRG: 091 | Disposition: A | Discharge status: Other Acute Inpatient Hospital | Payer: Medicare Other | Source: Intra-hospital | Attending: Physical Medicine & Rehabilitation | Admitting: Physical Medicine & Rehabilitation

## 2016-02-20 DIAGNOSIS — D62 Acute posthemorrhagic anemia: Secondary | ICD-10-CM | POA: Diagnosis present

## 2016-02-20 DIAGNOSIS — Z87891 Personal history of nicotine dependence: Secondary | ICD-10-CM

## 2016-02-20 DIAGNOSIS — R4589 Other symptoms and signs involving emotional state: Secondary | ICD-10-CM | POA: Diagnosis not present

## 2016-02-20 DIAGNOSIS — R4189 Other symptoms and signs involving cognitive functions and awareness: Secondary | ICD-10-CM | POA: Diagnosis present

## 2016-02-20 DIAGNOSIS — I959 Hypotension, unspecified: Secondary | ICD-10-CM | POA: Diagnosis not present

## 2016-02-20 DIAGNOSIS — I10 Essential (primary) hypertension: Secondary | ICD-10-CM

## 2016-02-20 DIAGNOSIS — R2681 Unsteadiness on feet: Secondary | ICD-10-CM | POA: Diagnosis present

## 2016-02-20 DIAGNOSIS — Z952 Presence of prosthetic heart valve: Secondary | ICD-10-CM | POA: Diagnosis not present

## 2016-02-20 DIAGNOSIS — B9620 Unspecified Escherichia coli [E. coli] as the cause of diseases classified elsewhere: Secondary | ICD-10-CM

## 2016-02-20 DIAGNOSIS — Z91030 Bee allergy status: Secondary | ICD-10-CM | POA: Diagnosis not present

## 2016-02-20 DIAGNOSIS — E871 Hypo-osmolality and hyponatremia: Secondary | ICD-10-CM

## 2016-02-20 DIAGNOSIS — N390 Urinary tract infection, site not specified: Secondary | ICD-10-CM | POA: Diagnosis present

## 2016-02-20 DIAGNOSIS — E46 Unspecified protein-calorie malnutrition: Secondary | ICD-10-CM | POA: Diagnosis present

## 2016-02-20 DIAGNOSIS — I602 Nontraumatic subarachnoid hemorrhage from anterior communicating artery: Secondary | ICD-10-CM | POA: Diagnosis present

## 2016-02-20 DIAGNOSIS — Z885 Allergy status to narcotic agent status: Secondary | ICD-10-CM | POA: Diagnosis not present

## 2016-02-20 DIAGNOSIS — Z955 Presence of coronary angioplasty implant and graft: Secondary | ICD-10-CM

## 2016-02-20 DIAGNOSIS — I2119 ST elevation (STEMI) myocardial infarction involving other coronary artery of inferior wall: Secondary | ICD-10-CM | POA: Diagnosis present

## 2016-02-20 DIAGNOSIS — I480 Paroxysmal atrial fibrillation: Secondary | ICD-10-CM

## 2016-02-20 DIAGNOSIS — I2121 ST elevation (STEMI) myocardial infarction involving left circumflex coronary artery: Secondary | ICD-10-CM | POA: Diagnosis not present

## 2016-02-20 DIAGNOSIS — B952 Enterococcus as the cause of diseases classified elsewhere: Secondary | ICD-10-CM | POA: Diagnosis present

## 2016-02-20 DIAGNOSIS — G441 Vascular headache, not elsewhere classified: Secondary | ICD-10-CM

## 2016-02-20 DIAGNOSIS — I609 Nontraumatic subarachnoid hemorrhage, unspecified: Secondary | ICD-10-CM | POA: Diagnosis not present

## 2016-02-20 DIAGNOSIS — I482 Chronic atrial fibrillation, unspecified: Secondary | ICD-10-CM | POA: Diagnosis present

## 2016-02-20 DIAGNOSIS — I221 Subsequent ST elevation (STEMI) myocardial infarction of inferior wall: Secondary | ICD-10-CM | POA: Diagnosis present

## 2016-02-20 MED ORDER — PENICILLIN V POTASSIUM 250 MG PO TABS
250.0000 mg | ORAL_TABLET | Freq: Three times a day (TID) | ORAL | Status: AC
  Administered 2016-02-20 – 2016-02-22 (×9): 250 mg via ORAL
  Filled 2016-02-20 (×10): qty 1

## 2016-02-20 MED ORDER — LIDOCAINE HCL 2 % EX GEL: CUTANEOUS | Status: DC | PRN

## 2016-02-20 MED ORDER — DIPHENHYDRAMINE HCL 12.5 MG/5ML PO ELIX: 12.5000 mg | ORAL_SOLUTION | Freq: Four times a day (QID) | ORAL | Status: DC | PRN

## 2016-02-20 MED ORDER — APIXABAN 2.5 MG PO TABS: 2.5000 mg | ORAL_TABLET | Freq: Two times a day (BID) | ORAL | Status: DC

## 2016-02-20 MED ORDER — CLOPIDOGREL BISULFATE 75 MG PO TABS: 75.0000 mg | ORAL_TABLET | Freq: Every day | ORAL | Status: DC

## 2016-02-20 MED ORDER — FLEET ENEMA 7-19 GM/118ML RE ENEM: 1.0000 | ENEMA | Freq: Once | RECTAL | Status: DC | PRN

## 2016-02-20 MED ORDER — LISINOPRIL 2.5 MG PO TABS
2.5000 mg | ORAL_TABLET | Freq: Every day | ORAL | Status: DC
  Administered 2016-02-21 – 2016-02-24 (×4): 2.5 mg via ORAL
  Filled 2016-02-20 (×4): qty 1

## 2016-02-20 MED ORDER — ATORVASTATIN CALCIUM 80 MG PO TABS: 80.0000 mg | ORAL_TABLET | Freq: Every day | ORAL | Status: DC

## 2016-02-20 MED ORDER — PROCHLORPERAZINE MALEATE 5 MG PO TABS
5.0000 mg | ORAL_TABLET | Freq: Four times a day (QID) | ORAL | Status: DC | PRN
  Administered 2016-02-21 – 2016-02-22 (×2): 10 mg via ORAL
  Administered 2016-02-23: 5 mg via ORAL
  Administered 2016-02-23 – 2016-02-24 (×2): 10 mg via ORAL
  Filled 2016-02-20 (×2): qty 2
  Filled 2016-02-20: qty 1
  Filled 2016-02-20 (×3): qty 2

## 2016-02-20 MED ORDER — SENNOSIDES-DOCUSATE SODIUM 8.6-50 MG PO TABS: 1.0000 | ORAL_TABLET | Freq: Every evening | ORAL | Status: DC | PRN

## 2016-02-20 MED ORDER — TRAZODONE HCL 50 MG PO TABS: 25.0000 mg | ORAL_TABLET | Freq: Every evening | ORAL | Status: DC | PRN

## 2016-02-20 MED ORDER — LISINOPRIL 2.5 MG PO TABS: 2.5000 mg | ORAL_TABLET | Freq: Every day | ORAL | Status: DC

## 2016-02-20 MED ORDER — PROCHLORPERAZINE 25 MG RE SUPP: 12.5000 mg | Freq: Four times a day (QID) | RECTAL | Status: DC | PRN

## 2016-02-20 MED ORDER — ATORVASTATIN CALCIUM 80 MG PO TABS
80.0000 mg | ORAL_TABLET | Freq: Every day | ORAL | Status: DC
  Administered 2016-02-21 – 2016-02-23 (×3): 80 mg via ORAL
  Filled 2016-02-20 (×3): qty 1

## 2016-02-20 MED ORDER — APIXABAN 2.5 MG PO TABS
2.5000 mg | ORAL_TABLET | Freq: Two times a day (BID) | ORAL | Status: DC
  Administered 2016-02-20 – 2016-02-24 (×8): 2.5 mg via ORAL
  Filled 2016-02-20 (×8): qty 1

## 2016-02-20 MED ORDER — PROCHLORPERAZINE EDISYLATE 5 MG/ML IJ SOLN: 5.0000 mg | Freq: Four times a day (QID) | INTRAMUSCULAR | Status: DC | PRN

## 2016-02-20 MED ORDER — CIPROFLOXACIN HCL 250 MG PO TABS: 250.0000 mg | ORAL_TABLET | Freq: Two times a day (BID) | ORAL | Status: DC

## 2016-02-20 MED ORDER — ACETAMINOPHEN 325 MG PO TABS: 650.0000 mg | ORAL_TABLET | ORAL | Status: DC | PRN

## 2016-02-20 MED ORDER — FLAVOXATE HCL 100 MG PO TABS
100.0000 mg | ORAL_TABLET | Freq: Three times a day (TID) | ORAL | Status: DC | PRN
Start: 2016-02-20 — End: 2016-02-24
  Filled 2016-02-20: qty 1

## 2016-02-20 MED ORDER — ALUM & MAG HYDROXIDE-SIMETH 200-200-20 MG/5ML PO SUSP: 30.0000 mL | ORAL | Status: DC | PRN

## 2016-02-20 MED ORDER — BISACODYL 10 MG RE SUPP: 10.0000 mg | Freq: Every day | RECTAL | Status: DC | PRN

## 2016-02-20 MED ORDER — CLOPIDOGREL BISULFATE 75 MG PO TABS
75.0000 mg | ORAL_TABLET | Freq: Every day | ORAL | Status: DC
  Administered 2016-02-21 – 2016-02-24 (×4): 75 mg via ORAL
  Filled 2016-02-20 (×4): qty 1

## 2016-02-20 MED ORDER — GUAIFENESIN-DM 100-10 MG/5ML PO SYRP: 5.0000 mL | ORAL_SOLUTION | Freq: Four times a day (QID) | ORAL | Status: DC | PRN

## 2016-02-20 NOTE — Progress Notes (Signed)
Rehab admissions - I have clearance from attending MD for re-admission to inpatient rehab today.  Bed available and will admit to acute inpatient rehab today.  Call me for questions.  [phone]

## 2016-02-20 NOTE — Care Management Important Message (Signed)
Important Message  Patient Details  Name: Jason Hawkins MRN: [id number] Date of Birth: 1928/06/02   Medicare Important Message Given:  Yes    Aldean Pipe [place]ena 02/20/2016, 3:15 PM

## 2016-02-20 NOTE — Care Management Note (Signed)
Case Management Note  Patient Details  Name: Jason Hawkins MRN: [id number] Date of Birth: Oct 06, 1927  Subjective/Objective: Pt presented for St[facility]. Pt was admitted from CIR and the plan is to return to CIR 02-20-16.                    Action/Plan: No further needs from CM at this time.   Expected Discharge Date:                  Expected Discharge Plan:  [place]  In-House Referral:  NA  Discharge planning Services  CM Consult, Medication Assistance  Post Acute Care Choice:  NA Choice offered to:  NA  DME Arranged:  N/A DME Agency:  NA  HH Arranged:  NA HH Agency:  NA  Status of Service:  Completed, signed off  If discussed at [place] of Stay Meetings, dates discussed:    Additional Comments:  Bethena Roys, RN 02/20/2016, 2:40 PM

## 2016-02-20 NOTE — PMR Pre-admission (Signed)
PMR Admission Coordinator Pre-Admission Assessment  Patient: Jason Hawkins is an 82 y.o., male MRN: [id number] DOB: 03-19-1928 Height: 5\' 9"  (175.3 cm) Weight: 77.656 kg (171 lb 3.2 oz)              Insurance Information HMO: No   PPO:       PCP:       IPA:       80/20:       OTHER:   PRIMARY:  Medicare A/B      Policy#: [id number]      Subscriber: Barbette Reichmann CM Name:        Phone#:       Fax#:   Pre-Cert#:        Employer:  Retired Benefits:  Phone #:       Name: Checked in [place]. Date: 12/05/94     Deduct: $1316      Out of Pocket Max: none      Life Max: unlimited CIR: 100%      SNF: 100 days Outpatient: 80%     Co-Pay: 20% Home Health: 100%      Co-Pay: none DME: 80%     Co-Pay: 20% Providers: patient's choice  SECONDARY: Generic commercial      Policy#: [id number]      Subscriber: Barbette Reichmann CM Name:        Phone#:       Fax#:   Pre-Cert#:        Employer:  Retired - Leisure centre manager Benefits:  Phone #: [phone]     Name:   Eff. Date:       Deduct:        Out of Pocket Max:        Life Max:   CIR:        SNF:   Outpatient:       Co-Pay:   Home Health:        Co-Pay:   DME:       Co-Pay:    Emergency Contact Information Contact Information    Name Relation Home Work [place] Son   [phone]   Hodgins,Leesa Daughter   [phone]     Current Medical History  Patient Admitting Diagnosis:  SAH and ACA aneurysm s/p coiling, recent MI  History of Present Illness: An 82 yo male with PMH of chronic a-fib (on coumadin)/AS with biological valve replacement/HTN and HLD who is s/p acute SAH 01/31/16 and had his coumadin reversed and underwent a coil embolization 02/01/2016. He was admitted to acute  inpatient rehab on 02/13/16 and had been started back on Eliquis.  In the afternoon of 02/16/16 patient began to feel weak and experienced pre-syncope and worsening nausea. Rapid response was called to the bedside and noted to be bradycardic and  hypotensive. He was pale, cool and clammy. Bp noted 71/37. Stat EKG done which showed a junctional rhythm at a rate of 30. He was placed on a zoll monitor, and cardiology was consulted. Dr. Sallyanne Kuster saw the patient in consultation.   Initial EKG showed what appeared to be a junctional rhythm, and was given 2 doses 2.5mg  of atropine, which increased his rate into the mid 40s, low 50s. EP was consulted related to symptomatic junctional rhythm, and possible pacemaker insertion. He denied any chest pain, but zoll monitoring was concerning for ST elevation. Repeat EKG was done and showed an acute inferior MI. The cath lab was called for emergent catheterization. Bp improved with  IV fluids, and he remained in NSR  in transport to the cath lab. Dr. Martinique  performed [place] and PCI. On 07/16 developed dysuria and painful urination.  Currently being treated for UTI. PT ongoing with recommendations to re-admit to acute inpatient rehab.    Total: 0=NIH  Past Medical History  Past Medical History  Diagnosis Date  . Chronic atrial fibrillation ([place])   . Severe aortic stenosis     s/p aortic valve replacement using a pericardial tissue valve  . HTN (hypertension)   . Lower extremity edema   . Hypercholesteremia     Family History  family history includes Diabetes in his mother; Heart attack in his father; Heart disease in his father.  Prior Rehab/Hospitalizations: Was admitted to CIR on 02/13/16 and tranferred back to acute hospital of 02/16/16 with an MI.  Has the patient had major surgery during 100 days prior to admission? No  Current Medications   Current facility-administered medications:  .  0.9 %  sodium chloride infusion, 250 mL, Intravenous, PRN, Peter M Martinique, MD .  acetaminophen (TYLENOL) tablet 650 mg, 650 mg, Oral, Q4H PRN, Peter M Martinique, MD, 650 mg at 02/18/16 2258 .  ampicillin (OMNIPEN) 1 g in sodium chloride 0.9 % 50 mL IVPB, 1 g, Intravenous, Q6H, Mihai Croitoru, MD, 1 g at 02/20/16  1102 .  apixaban (ELIQUIS) tablet 2.5 mg, 2.5 mg, Oral, BID, Otilio Miu, RPH, 2.5 mg at 02/20/16 1102 .  atorvastatin (LIPITOR) tablet 80 mg, 80 mg, Oral, q1800, Mihai Croitoru, MD, 80 mg at 02/19/16 1728 .  clopidogrel (PLAVIX) tablet 75 mg, 75 mg, Oral, Q breakfast, Peter M Martinique, MD, 75 mg at 02/20/16 0759 .  lisinopril (PRINIVIL,ZESTRIL) tablet 2.5 mg, 2.5 mg, Oral, Daily, Mihai Croitoru, MD, 2.5 mg at 02/20/16 1101 .  ondansetron (ZOFRAN) injection 4 mg, 4 mg, Intravenous, Q6H PRN, Peter M Martinique, MD, 4 mg at 02/20/16 0802 .  phenazopyridine (PYRIDIUM) tablet 100 mg, 100 mg, Oral, TID WC, Isaiah Serge, NP, 100 mg at 02/20/16 1102 .  sodium chloride flush (NS) 0.9 % injection 3 mL, 3 mL, Intravenous, Q12H, Peter M Martinique, MD, 3 mL at 02/19/16 2243 .  sodium chloride flush (NS) 0.9 % injection 3 mL, 3 mL, Intravenous, PRN, Peter M Martinique, MD  Patients Current Diet: Diet Heart Room service appropriate?: Yes; Fluid consistency:: Thin  Precautions / Restrictions Precautions Precautions: Fall Restrictions Weight Bearing Restrictions: No   Has the patient had 2 or more falls or a fall with injury in the past year?No  Prior Activity Level Limited Community (1-2x/wk): Goes out about 2 X a week  Development worker, international aid / Equipment Home Assistive Devices/Equipment: Eyeglasses  Prior Device Use: Indicate devices/aids used by the patient prior to current illness, exacerbation or injury? None  Prior Functional Level Prior Function Level of Independence: Independent Comments: prior to 1st admission  Self Care: Did the patient need help bathing, dressing, using the toilet or eating?  Independent  Indoor Mobility: Did the patient need assistance with walking from room to room (with or without device)? Independent  Stairs: Did the patient need assistance with internal or external stairs (with or without device)? Independent  Functional Cognition: Did the patient need help planning  regular tasks such as shopping or remembering to take medications? Independent  Current Functional Level Cognition  Overall Cognitive Status: Impaired/Different from baseline (per long-time friend present) Current Attention Level: Sustained Orientation Level: Oriented X4 Safety/Judgement: Decreased awareness of safety, Decreased awareness  of deficits General Comments: has multiple aspects of hospitalization confused (as he attempted to recount)    Extremity Assessment (includes Sensation/Coordination)  Upper Extremity Assessment: Overall WFL for tasks assessed  Lower Extremity Assessment: Generalized weakness, RLE deficits/detail, LLE deficits/detail RLE Deficits / Details: ankle DF to neutral only LLE Deficits / Details: ankle DF to neutral only    ADLs       Mobility  Overal bed mobility: Needs Assistance Bed Mobility: Supine to Sit, Sit to Supine Supine to sit: Supervision Sit to supine: Modified independent (Device/Increase time) General bed mobility comments: refused chair due to painful tailbone; supervission for safety    Transfers  Overall transfer level: Needs assistance Equipment used: None Transfers: Sit to/from Stand Sit to Stand: Min guard General transfer comment: pt used bed rail to steady himself as coming to tstand    Ambulation / Gait / Stairs / Emergency planning/management officer  Ambulation/Gait Ambulation/Gait assistance: Museum/gallery curator (Feet): 100 Feet Assistive device: 1 person hand held assist Gait Pattern/deviations: Step-through pattern, Decreased stride length, Decreased weight shift to right, Drifts right/left, Narrow base of support General Gait Details: nearly constant drift/lean to his left; able to incr velocity significantly however chooses slower velocity Gait velocity: decreased    Posture / Balance Static Standing Balance Tandem Stance - Left Leg: 0 (min assist to obain position and hold 10 sec) [place]berg - Eyes Opened: 0 (min assist to  obtain position; held x 15 sec) Balance Overall balance assessment: Needs assistance, History of Falls (fell in bathroom while hospitalized) Sitting-balance support: No upper extremity supported, Feet supported Sitting balance-Leahy Scale: Good Standing balance support: Single extremity supported Standing balance-Leahy Scale: Poor Standing balance comment: seeks UE support Tandem Stance - Left Leg: 0 (min assist to obain position and hold 10 sec) [place]omberg - Eyes Opened: 0 (min assist to obtain position; held x 15 sec) High level balance activites: Direction changes, Turns, Sudden stops, Head turns High Level Balance Comments: min assist with drift to his left    Special needs/care consideration BiPAP/CPAP No CPM No Continuous Drip IV NO Dialysis No        Life Vest No Oxygen No Special Bed No Trach Size No Wound Vac (area) No     Skin No                             Bowel mgmt: Last BM 02/19/16 Bladder mgmt: Burning and painful urination, currently being treated for UTI Diabetic mgmt No    Previous Home Environment Living Arrangements: Alone  Lives With: Alone Available Help at Discharge: Family, Friend(s), Available 24 hours/day Type of Home: House Home Layout: One level Home Access: Stairs to enter Entrance Stairs-Rails: Right Entrance Stairs-Number of Steps: 4 Bathroom Shower/Tub: Multimedia programmer: Handicapped height Home Care Services: No  Discharge Living Setting Plans for Discharge Living Setting: Patient's home, Alone, House (Family to stay with him as needed.) Type of Home at Discharge: House Discharge Home Layout: One level Discharge Home Access: Stairs to enter Entrance Stairs-Number of Steps: 3-5 steps  Social/Family/Support Systems Patient Roles: Parent, Other (Comment) (Has a daughter, 2 sons, 2 granschildren, brothers.) Contact Information: Dominique Forlenza - son - [phone] Anticipated Caregiver: Local son and family including teen age  grandchildren live about 700 yds away.  Dtr lives in [place] but can return to assist as needed.  Local son and dtr-in-law are primary supports. Anticipated Caregiver's Contact Information: Angie Fava -  daughter - [phone] Ability/Limitations of Caregiver: Family will make sure that someone stays with patient after discharge as needed. Caregiver Availability: 24/7 (If needed.) Discharge Plan Discussed with Primary Caregiver: Yes Is Caregiver In Agreement with Plan?: Yes Does Caregiver/Family have Issues with Lodging/Transportation while Pt is in Rehab?: No  Goals/Additional Needs Patient/Family Goal for Rehab: PT/OT mod I and supervision goals Expected length of stay: 7 to 10 days Cultural Considerations: [employer] Dietary Needs: Heart diet, thin liquids Equipment Needs: TBD Pt/Family Agrees to Admission and willing to participate: Yes Program Orientation Provided & Reviewed with Pt/Caregiver Including Roles  & Responsibilities: Yes  Decrease burden of Care through IP rehab admission: N/A  Possible need for SNF placement upon discharge: Not planned  Patient Condition: This patient's medical and functional status has changed since the consult dated: 02/13/16 in which the Rehabilitation Physician determined and documented that the patient's condition is appropriate for intensive rehabilitative care in an inpatient rehabilitation facility. Patient was admitted to acute inpatient rehab on 02/13/16 to 02/16/16.  See "History of Present Illness" (above) for medical update. Functional changes are: Currently requiring min assist to ambulate 100 feet +1 HHA.   Patient's medical and functional status update has been discussed with the Rehabilitation physician and patient remains appropriate for inpatient rehabilitation. Will admit to inpatient rehab today.  Preadmission Screen Completed By:  [name], 02/20/2016 11:30 AM ______________________________________________________________________    Discussed status with Dr. Posey Pronto on 02/20/16 at 1131 and received telephone approval for admission today.  Admission Coordinator:  [name], time1131/Date07/17/17

## 2016-02-20 NOTE — Interval H&P Note (Signed)
Jason Hawkins was admitted today to Inpatient Rehabilitation with the diagnosis of SAH, ACA aneurysm s/p coiling and STEMI.  The patient's history has been reviewed, patient examined, and there is no change in status.  Patient continues to be appropriate for intensive inpatient rehabilitation.  I have reviewed the patient's chart and labs.  Questions were answered to the patient's satisfaction. The PAPE has been reviewed and assessment remains appropriate.  Gill Delrossi Lorie Phenix 02/20/2016, 7:04 PM

## 2016-02-20 NOTE — Discharge Summary (Addendum)
Discharge Summary    Patient ID: Jason Hawkins,  MRN: [id number], DOB/AGE: 80-Sep-1929 80 y.o.  Admit date: 02/16/2016 Discharge date: 02/20/2016  Primary Care Provider: Walker Kehr Primary Cardiologist: Dr. 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 Bee Venom Anaphylaxis  . Codeine     nausea    Diagnostic Studies/Procedures  Coronary Stent Intervention Left Heart Cath and Coronary Angiography     Prox RCA to Mid RCA lesion, 30% stenosed.  Prox LAD to Mid LAD lesion, 30% stenosed.  Ost 1st Mrg to 1st Mrg lesion, 80% stenosed. This is a very small branch.  Dist Cx lesion, 100% stenosed. Post intervention, there is a 0% residual stenosis.  1. 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 2. Successful stenting of the distal LCx with DES.   Transthoracic Echocardiography 02/17/16 Study Conclusions  - Left ventricle: The cavity size was normal. Wall thickness was  normal. Systolic function was normal. The estimated ejection  fraction was in the range of 60% to 65%. - Aortic valve: A bioprosthesis was present. - Right atrium: The atrium was severely dilated.  _____________   History of Present Illness   Jason Hawkins is a 82 yo male with PMH of chronic a-fib, AS s/p biological valve replacement, HTN and HLD. He was admitted on 01/31/16 with subarachnoid hemorrhage. He underwent a coil embolization 02/01/2016. He was in inpatient rehab, and was started back on  Eliquis (previously on Coumadin).   On 02/16/16, he began to feel weak and experienced pre-syncope and worsening nausea. Rapid response was called to the bedside and noted to be bradycardic and hypotensive. He was pale, cool and clammy. Bp noted 71/37. Stat EKG done which showed a junctional rhythm at a rate of 30, repeat EKG showed acute inferior MI. He was taken emergently to the cath lab.    Hospital Course  Left heart cath showed 100% stenosed distal left circumflex. He underwent successful stenting with DES. He will continue Eliquis and Plavix. No aspirin given his high bleeding risk. His bradycardia resolved after successful stenting to the circumflex.   His Echo showed preserved EF of 60-65%, and his bioprosthetic valve is functioning normally. He did well post cath, and has walked with cardiac rehab and did very well.   His heart rate is well controlled, not on any nodal blockage. LDL is 100, continue high intensity statin.  He was diagnosed with enterococcus UTI, and on IV Ampicillin. Will need to continue this in inpatient rehab as it is not susceptible to Cipro.   He will return to inpatient rehab to complete his rehabilitation. He was seen today by Dr. Johnsie Cancel and deemed suitable for discharge to inpatient rehab.  _____________  Discharge Vitals Blood pressure 148/60, pulse 85, temperature 97.6 F (36.4 C), temperature source Oral, resp. rate 18, height 5\' 9"  (1.753 m), weight 171 lb 3.2 oz (77.656 kg), SpO2 96 %.  Filed Weights   02/16/16 1624 02/19/16 0609 02/20/16 0537  Weight: 181 lb (82.1  kg) 182 lb 8.7 oz (82.8 kg) 171 lb 3.2 oz (77.656 kg)    Labs & Radiologic Studies     CBC  Recent Labs  02/19/16 0315  WBC 9.2  HGB 11.8*  HCT 36.0*  MCV 94.5  PLT [id number]   Basic Metabolic Panel  Recent Labs  02/18/16 1056  NA 135  K 4.0  CL 107  CO2 22  GLUCOSE 151*  BUN 16  CREATININE 0.88  CALCIUM 8.8*    Ct Angio Head W Or Wo Contrast  01/31/2016  CLINICAL  DATA:  Subarachnoid hemorrhage. No known trauma. On Coumadin for aortic valve replacement. EXAM: CT ANGIOGRAPHY HEAD AND NECK TECHNIQUE: Multidetector CT imaging of the head and neck was performed using the standard protocol during bolus administration of intravenous contrast. Multiplanar CT image reconstructions and MIPs were obtained to evaluate the vascular anatomy. Carotid stenosis measurements (when applicable) are obtained utilizing NASCET criteria, using the distal internal carotid diameter as the denominator. CONTRAST:  50 mL Isovue 370 IV COMPARISON:  CT head 01/31/2016 FINDINGS: CTA NECK Aortic arch: Atherosclerotic calcification aortic arch without aneurysm or dissection. Atherosclerotic calcification proximal great vessels which are widely patent. Lung apices clear. Right carotid system: Right common carotid artery widely patent. Mild atherosclerotic calcification in the right carotid bifurcation without significant stenosis. Left carotid system: Left common carotid artery widely patent. Atherosclerotic calcification of the carotid bifurcation without significant stenosis. Vertebral arteries:Both vertebral arteries are patent to the basilar without significant vertebral stenosis. Skeleton: Moderately severe cervical spondylosis. No fracture or mass lesion. Other neck: Negative for mass or adenopathy in the neck. CTA HEAD Anterior circulation: Mild atherosclerotic calcification in the cavernous carotid artery bilaterally. No aneurysm in the cavernous carotid. Anterior and middle cerebral arteries patent bilaterally. 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 This is presumably the cause of the symmetric subarachnoid hemorrhage. Posterior circulation: Both vertebral arteries patent to the basilar. PICA patent. Basilar widely patent. Superior cerebellar and posterior cerebral arteries patent bilaterally. 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 This is presumably the cause of acute subarachnoid hemorrhage. No other aneurysm. Mild atherosclerotic disease the carotid bifurcation bilaterally without significant carotid stenosis. No significant vertebral stenosis. These results were called by telephone at the time of interpretation on 01/31/2016 at 10:12 pm to Dr. Wallie Char , who verbally acknowledged these results. Electronically Signed   By: Franchot Gallo M.D.   On: 01/31/2016 22:14   Ct Head Wo Contrast  02/03/2016  CLINICAL DATA:  Continued surveillance subarachnoid hemorrhage. EXAM: CT HEAD WITHOUT CONTRAST TECHNIQUE: Contiguous axial images were obtained from the base of the skull through the vertex without intravenous contrast. COMPARISON:  Multiple priors. FINDINGS: The patient has undergone endovascular treatment with coil embolization of an anterior communicating artery aneurysm. Coil mass appears compact without migration. Resolution of previously noted blood in the basilar and interhemispheric cisterns. Slight layering hemorrhage in the occipital horns of the lateral ventricles but no intervening hydrocephalus, and no new subarachnoid blood. No visible cerebral infarction due to vasospasm. Mild sinus fluid predominantly sphenoid, likely due to recumbency. No mastoid fluid. Calvarium intact. Negative orbits. IMPRESSION: Satisfactory appearance status post coiling of an ACom aneurysm. No postprocedural complications.  No intervening hydrocephalus. Electronically Signed   By: Staci Righter M.D.   On: 02/03/2016 11:16   Ct Head Wo Contrast  01/31/2016  CLINICAL DATA:  Sudden onset of headaches EXAM: CT HEAD WITHOUT CONTRAST TECHNIQUE: Contiguous axial images were obtained from the base of the skull through the vertex without intravenous contrast. COMPARISON:  None. FINDINGS: Bony calvarium is intact. There are changes consistent with subarachnoid hemorrhage  along the distribution of the middle cerebral arteries and anterior cerebral arteries bilaterally. Given the patient's clinical history these changes are consistent with ruptured aneurysm. No intraventricular hemorrhage is noted. The basilar cisterns are not filled with blood. No mass lesion is noted. No acute infarct is seen. Mild atrophic changes are noted. IMPRESSION: Subarachnoid hemorrhage as described above. A definitive aneurysm is not noted on this exam. Critical Value/emergent results were called by telephone at the time of interpretation on 01/31/2016 at 9:28 pm to Dr. Nicole Kindred, who verbally acknowledged these results. Electronically Signed   By: Inez Catalina M.D.   On: 01/31/2016 21:30   Ct Angio Neck W Or Wo Contrast  01/31/2016  CLINICAL DATA:  Subarachnoid hemorrhage. No known trauma. On Coumadin for aortic valve replacement. EXAM: CT ANGIOGRAPHY HEAD AND NECK TECHNIQUE: Multidetector CT imaging of the head and neck was performed using the standard protocol during bolus administration of intravenous contrast. Multiplanar CT image reconstructions and MIPs were obtained to evaluate the vascular anatomy. Carotid stenosis measurements (when applicable) are obtained utilizing NASCET criteria, using the distal internal carotid diameter as the denominator. CONTRAST:  50 mL Isovue 370 IV COMPARISON:  CT head 01/31/2016 FINDINGS: CTA NECK Aortic arch: Atherosclerotic calcification aortic arch without aneurysm or dissection. Atherosclerotic calcification proximal great vessels which are widely patent. Lung apices clear. Right carotid system: Right common carotid artery widely patent. Mild atherosclerotic calcification in the right carotid bifurcation without significant stenosis. Left carotid system: Left common carotid artery widely patent. Atherosclerotic calcification of the carotid bifurcation without significant stenosis. Vertebral arteries:Both vertebral arteries are patent to the basilar without  significant vertebral stenosis. Skeleton: Moderately severe cervical spondylosis. No fracture or mass lesion. Other neck: Negative for mass or adenopathy in the neck. CTA HEAD Anterior circulation: Mild atherosclerotic calcification in the cavernous carotid artery bilaterally. No aneurysm in the cavernous carotid. Anterior and middle cerebral arteries patent bilaterally. 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 This is presumably the cause of the symmetric subarachnoid hemorrhage. Posterior circulation: Both vertebral arteries patent to the basilar. PICA patent. Basilar widely patent. Superior cerebellar and posterior cerebral arteries patent bilaterally. 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 This is presumably the cause of acute subarachnoid hemorrhage. No other aneurysm. Mild atherosclerotic disease the carotid bifurcation bilaterally without significant carotid stenosis. No significant vertebral stenosis. These results were called by telephone at the time of interpretation on 01/31/2016 at 10:12 pm to Dr. Wallie Char , who verbally acknowledged these results. Electronically Signed   By: Franchot Gallo M.D.   On: 01/31/2016 22:14   Dg Chest Port 1 View  02/08/2016  CLINICAL DATA:  Dyspnea and lethargy for 1 day, history hypertension, atrial fibrillation, aortic stenosis post AVR, prostate cancer EXAM: PORTABLE CHEST 1 VIEW COMPARISON:  Portable exam 1953 hours compared to 02/05/2016 FINDINGS: Enlargement of cardiac silhouette post median sternotomy and AVR. Atherosclerotic calcification aortic arch. Mediastinal contours and pulmonary vascularity normal. Bibasilar atelectasis with improved aeration at LEFT base since previous exam. Tiny LEFT pleural effusion. Improved RIGHT upper lobe infiltrate. No pneumothorax. Degenerative changes RIGHT AC joint. IMPRESSION:  Bibasilar atelectasis  and tiny LEFT pleural effusion. Improved RIGHT upper lobe infiltrate. Improved aeration since previous exam. Enlargement of cardiac silhouette post AVR. Electronically Signed   By: Lavonia Dana M.D.   On: 02/08/2016 20:04   Dg Chest Port 1 View  02/05/2016  CLINICAL DATA:  Acute respiratory failure Fever of 100 this morning O2 at 96% EXAM: PORTABLE CHEST - 1 VIEW COMPARISON:  the previous day's study FINDINGS: Slight improvement in the focal right upper and lower lobe airspace disease. Left retrocardiac consolidation/atelectasis persists. Heart size upper limits normal.  Previous CABG and AVR. Small pleural effusions persist. Degenerative changes in the right shoulder. IMPRESSION: 1. Slight improvement in right lung airspace disease. 2. Small effusions with persistent left retrocardiac consolidation/atelectasis. Electronically Signed   By: Lucrezia Europe M.D.   On: 02/05/2016 08:40   Dg Chest Port 1 View  02/04/2016  CLINICAL DATA:  Shortness of Breath EXAM: PORTABLE CHEST 1 VIEW COMPARISON:  10/19/2003 FINDINGS: Cardiac shadow is mildly enlarged. Postsurgical changes are again seen. Diffuse patchy infiltrates are noted throughout the right lung as well as to a lesser degree in the left lung base. No bony abnormality is noted. IMPRESSION: Bilateral airspace opacities right greater than left. Followup examination is recommended. Electronically Signed   By: Inez Catalina M.D.   On: 02/04/2016 10:11    Disposition   Pt is being discharged home today in good condition.  Follow-up Plans & Appointments     Discharge Instructions    Amb Referral to Cardiac Rehabilitation    Complete by:  As directed   Diagnosis:   PTCA STEMI Coronary Stents       Diet - low sodium heart healthy    Complete by:  As directed      Increase activity slowly    Complete by:  As directed            Discharge Medications   Current Discharge Medication List    START taking these medications    Details  apixaban (ELIQUIS) 2.5 MG TABS tablet Take 1 tablet (2.5 mg total) by mouth 2 (two) times daily. Qty: 60 tablet, Refills: 12    atorvastatin (LIPITOR) 80 MG tablet Take 1 tablet (80 mg total) by mouth daily at 6 PM. Qty: 30 tablet, Refills: 12    clopidogrel (PLAVIX) 75 MG tablet Take 1 tablet (75 mg total) by mouth daily with breakfast. Qty: 30 tablet, Refills: 12    lisinopril (PRINIVIL,ZESTRIL) 2.5 MG tablet Take 1 tablet (2.5 mg total) by mouth daily. Qty: 30 tablet, Refills: 12      CONTINUE these medications which have NOT CHANGED   Details  Multiple Vitamin (MULTIVITAMIN) capsule Take 1 capsule by mouth daily.        STOP taking these medications     warfarin (COUMADIN) 5 MG tablet          Aspirin prescribed at discharge? No, on Eliquis and Plavix.  High Intensity Statin Prescribed? Yes Beta Blocker Prescribed? No  For EF 45% or less, Was ACEI/ARB Prescribed? No  ADP Receptor Inhibitor Prescribed? Yes For EF <40%, Aldosterone Inhibitor Prescribed? No, EF was 60-65% EF assessed during THIS hospitalization? Yes Was Cardiac Rehab II ordered? Yes   Outstanding Labs/Studies    Duration of Discharge Encounter   Greater than 30 minutes including physician time.  Signed, Arbutus Leas NP 02/20/2016, 11:55 AM

## 2016-02-20 NOTE — H&P (Signed)
Physical Medicine and Rehabilitation Admission H&P    CC: SAH complicated by STEMI  HPI: Jason Hawkins is a 82 y.o. 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 He was found to have [place] and cerebral angiogram revealed 1.5 X 3 mm ACA aneurysm and INR 1.8. Coumadin reversed and BP elevated therefore he was placed on Cardene drip for BP control. He underwent coil embolization of ACA aneurysm on 02/01/16 by Dr. Kathyrn Sheriff. Post op reported to have CP due to fluid overload and improved with IV diuresis. He was admitted to CIR on 02/13/16 and topamax was added due to reports of severe HA. Lethargy was resolving but he continued to have moderate cognitive deficits and required min assist with ADL and mobility.  He developed nauseas with MS changed and was found to be hypotensive with junctional rhythm and repeat EKG showed acute inferior STEMI.  He was taken to cath lab for work up and underwent successful stenting of distal Left Cx with DES. Transient 3rd degree AVB due to inferior MI has resolved and BP stable. Eliquis resumed and Plavix added X 12 months due to stent. No ASA due to recent SAH and no BB due to slow ventricular rate in AF. Echo with normal function of bioprosthetic valve.  Blood pressures trending up and low dose lisinopril added for tighter control. He has had complaints of frequency and was started on IV ampicillin for enterococcus UTI. Therapy resumed and patient continues to have narrow BOS, drift to right/left and decreased in activity tolerance. He was admitted back to CIR to complete his rehab course.    Review of Systems  Constitutional: Positive for malaise/fatigue.  HENT: Negative for hearing loss.   Eyes: Negative for blurred vision and double vision.  Respiratory: Negative for cough and shortness of breath.   Cardiovascular: Negative for chest pain, palpitations and leg swelling.  Gastrointestinal: Negative  for heartburn and nausea.  Genitourinary: Positive for dysuria and frequency.  Skin: Negative for itching and rash.  Neurological: Negative for dizziness, tingling, focal weakness and headaches.  Psychiatric/Behavioral: The patient has insomnia (due to bladder issues. ).   All other systems reviewed and are negative.     Past Medical History  Diagnosis Date  . Chronic atrial fibrillation ([place])   . Severe aortic stenosis     s/p aortic valve replacement using a pericardial tissue valve  . HTN (hypertension)   . Lower extremity edema   . Hypercholesteremia     Past Surgical History  Procedure Laterality Date  . Aortic valve replacement  08/30/2003    Dr. Cyndia Bent  . Radiology with anesthesia N/A 02/01/2016    Procedure: RADIOLOGY WITH ANESTHESIA;  Surgeon: Consuella Lose, MD;  Location: [facility];  Service: Radiology;  Laterality: N/A;  . Cardiac catheterization N/A 02/16/2016    Procedure: Left Heart Cath and Coronary Angiography;  Surgeon: Peter M Martinique, MD;  Location: [facility];  Service: Cardiovascular;  Laterality: N/A;  . Cardiac catheterization N/A 02/16/2016    Procedure: Coronary Stent Intervention;  Surgeon: Peter M Martinique, MD;  Location: [facility];  Service: Cardiovascular;  Laterality: N/A;    Family History  Problem Relation Age of Onset  . Heart attack    . Diabetes    . Diabetes Mother   . Heart disease Father   . Heart attack Father     Social History:  Widowed--has son and daughter in  law in town (work). Retired from [employer]. He reports that he has quit smoking. He does not have any smokeless tobacco history on file. He reports that he drinks about 8.4 oz of alcohol per week. He reports that he does not use illicit drugs   Allergies  Allergen Reactions  . Bee Venom Anaphylaxis  . Codeine     nausea    Medications Prior to Admission  Medication Sig Dispense Refill  . Multiple Vitamin (MULTIVITAMIN) capsule Take 1 capsule by  mouth daily.      [name] warfarin (COUMADIN) 5 MG tablet Take 2.5-5 mg by mouth See admin instructions. Med last filled 01/19/2016 with [facility] - Last Coag Visit states Pt was to take med 2.5 mg SUN TUES THURS SAT, and 5 mg MON WED FRI      Home: Home Living Family/patient expects to be discharged to:: Inpatient rehab   Functional History: Prior Function Level of Independence: Independent Comments: prior to 1st admission  Functional Status:  Mobility: Bed Mobility Overal bed mobility: Needs Assistance Bed Mobility: Supine to Sit, Sit to Supine Supine to sit: Supervision Sit to supine: Modified independent (Device/Increase time) General bed mobility comments: refused chair due to painful tailbone; supervission for safety Transfers Overall transfer level: Needs assistance Equipment used: None Transfers: Sit to/from Stand Sit to Stand: Min guard General transfer comment: pt used bed rail to steady himself as coming to tstand Ambulation/Gait Ambulation/Gait assistance: Min assist Ambulation Distance (Feet): 100 Feet Assistive device: 1 person hand held assist Gait Pattern/deviations: Step-through pattern, Decreased stride length, Decreased weight shift to right, Drifts right/left, Narrow base of support General Gait Details: nearly constant drift/lean to his left; able to incr velocity significantly however chooses slower velocity Gait velocity: decreased    ADL:    Cognition: Cognition Overall Cognitive Status: Impaired/Different from baseline (per long-time friend present) Orientation Level: Oriented X4 Cognition Arousal/Alertness: Awake/alert Behavior During Therapy: WFL for tasks assessed/performed Overall Cognitive Status: Impaired/Different from baseline (per long-time friend present) Area of Impairment: Attention, Memory, Safety/judgement, Awareness Current Attention Level: Sustained Memory: Decreased short-term memory Safety/Judgement: Decreased awareness of  safety, Decreased awareness of deficits Awareness: Emergent Problem Solving: Requires verbal cues, Requires tactile cues, Slow processing General Comments: has multiple aspects of hospitalization confused (as he attempted to recount)   Blood pressure 148/60, pulse 85, temperature 97.6 F (36.4 C), temperature source Oral, resp. rate 18, height 5\' 9"  (1.753 m), weight 77.656 kg (171 lb 3.2 oz), SpO2 96 %. Physical Exam  Nursing note and vitals reviewed. Constitutional: He is oriented to person, place, and time. He appears well-developed and well-nourished. He is easily aroused. He appears ill. No distress.  Fatigued appearing and kept eyes closed for most of exam  HENT:  Head: Normocephalic and atraumatic.  Mouth/Throat: Oropharynx is clear and moist.  Eyes: Conjunctivae and EOM are normal. Pupils are equal, round, and reactive to light.  Neck: Normal range of motion. Neck supple.  Cardiovascular: Normal rate.   Occasional extrasystoles are present.  Respiratory: Effort normal and breath sounds normal. No stridor.  GI: Soft. Bowel sounds are normal. He exhibits no distension. There is no tenderness.  Musculoskeletal: He exhibits no edema or tenderness.  Neurological: He is oriented to person, place, and time and easily aroused.  Fatigued appearing with slow speech.   Able to answer orientation questions without difficulty.  Able to follow basic motor commands.  Motor: B/l UE: 4+5 proximal to distal B/l LE: 4/5 proximal to distal  Skin:  Skin is warm and dry.  Psychiatric: His affect is blunt. He is slowed.    Results for orders placed or performed during the hospital encounter of 02/16/16 (from the past 48 hour(s))  CBC     Status: Abnormal   Collection Time: 02/19/16  3:15 AM  Result Value Ref Range   WBC 9.2 4.0 - 10.5 K/uL   RBC 3.81 (L) 4.22 - 5.81 MIL/uL   Hemoglobin 11.8 (L) 13.0 - 17.0 g/dL   HCT 36.0 (L) 39.0 - 52.0 %   MCV 94.5 78.0 - 100.0 fL   MCH 31.0 26.0 - 34.0 pg     MCHC 32.8 30.0 - 36.0 g/dL   RDW 13.1 11.5 - 15.5 %   Platelets 389 150 - 400 K/uL   No results found.     Medical Problem List and Plan: 1.  Unsteady gait and LOB with activity secondary to [facility], ACA aneurysm s/p coiling and STEMI. 2.  DVT Prophylaxis/Anticoagulation: Pharmaceutical: Other (comment)--Eliquis 3. Pain Management: Tylenol prn for occasional HA.  4. Mood: LCSW to follow for evaluation and support.  5. Neuropsych: This patient is not fully capable of making decisions on his own behalf. 6. Skin/Wound Care:Routine pressure relief measures. Maintain adequate nutrition and hydration status.  7. Fluids/Electrolytes/Nutrition: Monitor I/O. Check lytes in am.  8. STEMI s/p DES LCx: On plavix and high dose lipitor. NO ASA due to recent [facility]. 9. HTN: Monitor BP tid. Continue to Lisinopril and titrate as indicated. 10. Enterococcus UTI: Antibiotic D # 3/5. Leucocytosis resolved and enterococcus is pansensitive therefore will change to penicillin. Continues to report dysuria/frequency improving--will check PVRs to rule out retention and add urispas.   11. Hyponatremia: Has resolved. Will recheck lytes in am.  12. A. Fib: Cont meds, monitor for signs/symptoms of bleeding 13. ABLA: Monitor CBC   Post Admission Physician Evaluation: 1. Functional deficits secondary  to to [facility], ACA aneurysm s/p coiling and STEMI.. 2. Patient is admitted to receive collaborative, interdisciplinary care between the physiatrist, rehab nursing staff, and therapy team. 3. Patient's level of medical complexity and substantial therapy needs in context of that medical necessity cannot be provided at a lesser intensity of care such as a SNF. 4. Patient has experienced substantial functional loss from his/her baseline which was documented above under the "Functional History" and "Functional Status" headings.  Judging by the patient's diagnosis, physical exam, and functional history, the patient has potential for  functional progress which will result in measurable gains while on inpatient rehab.  These gains will be of substantial and practical use upon discharge  in facilitating mobility and self-care at the household level. 5. Physiatrist will provide 24 hour management of medical needs as well as oversight of the therapy plan/treatment and provide guidance as appropriate regarding the interaction of the two. 6. 24 hour rehab nursing will assist with safety, skin/wound care, medication administration, pain management and patient education and help integrate therapy concepts, techniques,education, etc. 7. PT will assess and treat for/with: Lower extremity strength, range of motion, stamina, balance, functional mobility, safety, adaptive techniques and equipment, woundcare, coping skills, pain control, stroke education.   Goals are: Supervision/Mod I. 8. OT will assess and treat for/with: ADL's, functional mobility, safety, upper extremity strength, adaptive techniques and equipment, wound mgt, ego support, and community reintegration.   Goals are: Supervision/Mod I. Therapy may not proceed with showering this patient. 9. Case Management and Social Worker will assess and treat for psychological issues and discharge planning. 10. Team conference  will be held weekly to assess progress toward goals and to determine barriers to discharge. 11. Patient will receive at least 3 hours of therapy per day at least 5 days per week. 12. ELOS: 8-12 days. 13. Prognosis:  good  Delice Lesch, MD 02/20/2016

## 2016-02-20 NOTE — Progress Notes (Signed)
Physical Therapy Treatment Patient Details Name: Jason Hawkins MRN: [id number] DOB: 08-Mar-1928 Today's Date: 02/20/2016    History of Present Illness 82 yo male with PMH of chronic a-fib (on coumadin)/AS with biological valve replacement/HTN and HLD who is s/p acute SAH due to aneurysm 01/31/16 and had his coumadin reversed and underwent a coil embolization 02/01/2016; went to inpatient rehab and returned to acute hospital 02/16/16 S/P acute inferior wall STEMI with emergency PCI     PT Comments    Pt was eager to walk today and get out of bed. He is unsteady on his feet and prefers 1 person hand held assist over using a walker. He was provided with a LE HEP (see exit care). He would benefit from continued PT to achieve functional independence.    Follow Up Recommendations  CIR     Equipment Recommendations  None recommended by PT    Recommendations for Other Services OT consult;Rehab consult     Precautions / Restrictions Precautions Precautions: Fall Restrictions Weight Bearing Restrictions: No    Mobility  Bed Mobility Overal bed mobility: Modified Independent Bed Mobility: Supine to Sit     Supine to sit: Modified independent (Device/Increase time)        Transfers Overall transfer level: Needs assistance Equipment used: None Transfers: Sit to/from Stand (x2) Sit to Stand: Min guard (for safty)         General transfer comment: Pt slightly unsteady coming from sit to stand and needed a moment to stabalize before beginning ambulation. Uncontrolled decent from stand to sit into the recliner.  Ambulation/Gait Ambulation/Gait assistance: Min assist Ambulation Distance (Feet): 150 Feet Assistive device: 1 person hand held assist Gait Pattern/deviations: Step-through pattern;Decreased stride length Gait velocity: decreased Gait velocity interpretation: Below normal speed for age/gender General Gait Details: Pt stated he was wobbly on his feet. Leaned more on  therapist for stability when stepping on the R foot.     Balance Overall balance assessment: Needs assistance Sitting-balance support: Bilateral upper extremity supported;Feet supported Sitting balance-Leahy Scale: Fair Sitting balance - Comments: Performed LE HEP (see exit care) while sitting at EOB. Required bil UE to maintain balance.   Standing balance support: Single extremity supported Standing balance-Leahy Scale: Poor Standing balance comment: Required upper extremity support. May benefit from hemiwalker/cane to gain independence.                    Cognition Arousal/Alertness: Awake/alert Behavior During Therapy: WFL for tasks assessed/performed Overall Cognitive Status: Within Functional Limits for tasks assessed           Safety/Judgement: Decreased awareness of safety;Decreased awareness of deficits Awareness: Emergent Problem Solving: Slow processing;Requires verbal cues;Requires tactile cues      Exercises General Exercises - Lower Extremity Ankle Circles/Pumps: AROM;Both;10 reps;Seated Long Arc Quad: AROM;Both;10 reps;Seated Heel Slides: AROM;Both;10 reps;Seated Hip ABduction/ADduction: AROM;Both;10 reps;Seated    General Comments General comments (skin integrity, edema, etc.): Pt stated the pain in his tail bone has improved and was agreeable to sitting in the recliner today. He is hoping to return to inpatient rehab.      Pertinent Vitals/Pain Pain Assessment: No/denies pain Pain Score: 0-No pain    Home Living   Living Arrangements: Alone Available Help at Discharge: Family;Friend(s);Available 24 hours/day Type of Home: House Home Access: Stairs to enter Entrance Stairs-Rails: Right Home Layout: One level            PT Goals (current goals can now be found in the care plan section)  Acute Rehab PT Goals Patient Stated Goal: Go back to CIR PT Goal Formulation: With patient Time For Goal Achievement: 02/25/16 Potential to Achieve Goals:  Good Progress towards PT goals: Progressing toward goals    Frequency  Min 3X/week    PT Plan Current plan remains appropriate       End of Session Equipment Utilized During Treatment: Gait belt Activity Tolerance: Patient tolerated treatment well Patient left: in chair;with call bell/phone within reach;with chair alarm set     Time: 1040-1100 PT Time Calculation (min) (ACUTE ONLY): 20 min  Charges:                       G CodesGary Fleet [id number]  02/20/2016, 11:39 AM

## 2016-02-20 NOTE — Progress Notes (Signed)
Patient ID: Jason Hawkins, male   DOB: 07-21-1928, 82 y.o.   MRN: [id number] Patient, family, and belongings arrived with RN via wheelchair. Patient oriented to rehab from previous admission of 02/13/16. Patient eating dinner with no complaints of pain. Nurse call bell at patients side.

## 2016-02-20 NOTE — Progress Notes (Signed)
While rounding, I made a follow up visit with the patient. The patient was melancholy as he reminisced on life events. I provided the ministry of listening and prayer. I am available for follow up as needed.  Darene Lamer Vitaly Wanat    02/20/16 1000  Clinical Encounter Type  Visited With Patient  Visit Type Follow-up;Spiritual support  Referral From Patient;Nurse  Spiritual Encounters  Spiritual Needs Prayer;Emotional  Stress Factors  Patient Stress Factors Exhausted;Health changes;Major life changes

## 2016-02-20 NOTE — H&P (View-Only) (Signed)
Physical Medicine and Rehabilitation Admission H&P    CC: SAH complicated by STEMI  HPI: Jason Hawkins is a 82 y.o. 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 He was found to have [place] and cerebral angiogram revealed 1.5 X 3 mm ACA aneurysm and INR 1.8. Coumadin reversed and BP elevated therefore he was placed on Cardene drip for BP control. He underwent coil embolization of ACA aneurysm on 02/01/16 by Dr. Kathyrn Sheriff. Post op reported to have CP due to fluid overload and improved with IV diuresis. He was admitted to CIR on 02/13/16 and topamax was added due to reports of severe HA. Lethargy was resolving but he continued to have moderate cognitive deficits and required min assist with ADL and mobility.  He developed nauseas with MS changed and was found to be hypotensive with junctional rhythm and repeat EKG showed acute inferior STEMI.  He was taken to cath lab for work up and underwent successful stenting of distal Left Cx with DES. Transient 3rd degree AVB due to inferior MI has resolved and BP stable. Eliquis resumed and Plavix added X 12 months due to stent. No ASA due to recent SAH and no BB due to slow ventricular rate in AF. Echo with normal function of bioprosthetic valve.  Blood pressures trending up and low dose lisinopril added for tighter control. He has had complaints of frequency and was started on IV ampicillin for enterococcus UTI. Therapy resumed and patient continues to have narrow BOS, drift to right/left and decreased in activity tolerance. He was admitted back to CIR to complete his rehab course.    Review of Systems  Constitutional: Positive for malaise/fatigue.  HENT: Negative for hearing loss.   Eyes: Negative for blurred vision and double vision.  Respiratory: Negative for cough and shortness of breath.   Cardiovascular: Negative for chest pain, palpitations and leg swelling.  Gastrointestinal: Negative  for heartburn and nausea.  Genitourinary: Positive for dysuria and frequency.  Skin: Negative for itching and rash.  Neurological: Negative for dizziness, tingling, focal weakness and headaches.  Psychiatric/Behavioral: The patient has insomnia (due to bladder issues. ).   All other systems reviewed and are negative.     Past Medical History  Diagnosis Date  . Chronic atrial fibrillation ([place])   . Severe aortic stenosis     s/p aortic valve replacement using a pericardial tissue valve  . HTN (hypertension)   . Lower extremity edema   . Hypercholesteremia     Past Surgical History  Procedure Laterality Date  . Aortic valve replacement  08/30/2003    Dr. Cyndia Bent  . Radiology with anesthesia N/A 02/01/2016    Procedure: RADIOLOGY WITH ANESTHESIA;  Surgeon: Consuella Lose, MD;  Location: [facility];  Service: Radiology;  Laterality: N/A;  . Cardiac catheterization N/A 02/16/2016    Procedure: Left Heart Cath and Coronary Angiography;  Surgeon: Peter M Martinique, MD;  Location: [facility];  Service: Cardiovascular;  Laterality: N/A;  . Cardiac catheterization N/A 02/16/2016    Procedure: Coronary Stent Intervention;  Surgeon: Peter M Martinique, MD;  Location: [facility];  Service: Cardiovascular;  Laterality: N/A;    Family History  Problem Relation Age of Onset  . Heart attack    . Diabetes    . Diabetes Mother   . Heart disease Father   . Heart attack Father     Social History:  Widowed--has son and daughter in  law in town (work). Retired from [employer], [employer]. He reports that he has quit smoking. He does not have any smokeless tobacco history on file. He reports that he drinks about 8.4 oz of alcohol per week. He reports that he does not use illicit drugs   Allergies  Allergen Reactions  . Bee Venom Anaphylaxis  . Codeine     nausea    Medications Prior to Admission  Medication Sig Dispense Refill  . Multiple Vitamin (MULTIVITAMIN) capsule Take 1 capsule by  mouth daily.      [name] warfarin (COUMADIN) 5 MG tablet Take 2.5-5 mg by mouth See admin instructions. Med last filled 01/19/2016 with [facility] - Last Coag Visit states Pt was to take med 2.5 mg SUN TUES THURS SAT, and 5 mg MON WED FRI      Home: Home Living Family/patient expects to be discharged to:: Inpatient rehab   Functional History: Prior Function Level of Independence: Independent Comments: prior to 1st admission  Functional Status:  Mobility: Bed Mobility Overal bed mobility: Needs Assistance Bed Mobility: Supine to Sit, Sit to Supine Supine to sit: Supervision Sit to supine: Modified independent (Device/Increase time) General bed mobility comments: refused chair due to painful tailbone; supervission for safety Transfers Overall transfer level: Needs assistance Equipment used: None Transfers: Sit to/from Stand Sit to Stand: Min guard General transfer comment: pt used bed rail to steady himself as coming to tstand Ambulation/Gait Ambulation/Gait assistance: Min assist Ambulation Distance (Feet): 100 Feet Assistive device: 1 person hand held assist Gait Pattern/deviations: Step-through pattern, Decreased stride length, Decreased weight shift to right, Drifts right/left, Narrow base of support General Gait Details: nearly constant drift/lean to his left; able to incr velocity significantly however chooses slower velocity Gait velocity: decreased    ADL:    Cognition: Cognition Overall Cognitive Status: Impaired/Different from baseline (per long-time friend present) Orientation Level: Oriented X4 Cognition Arousal/Alertness: Awake/alert Behavior During Therapy: WFL for tasks assessed/performed Overall Cognitive Status: Impaired/Different from baseline (per long-time friend present) Area of Impairment: Attention, Memory, Safety/judgement, Awareness Current Attention Level: Sustained Memory: Decreased short-term memory Safety/Judgement: Decreased awareness of  safety, Decreased awareness of deficits Awareness: Emergent Problem Solving: Requires verbal cues, Requires tactile cues, Slow processing General Comments: has multiple aspects of hospitalization confused (as he attempted to recount)   Blood pressure 148/60, pulse 85, temperature 97.6 F (36.4 C), temperature source Oral, resp. rate 18, height 5\' 9"  (1.753 m), weight 77.656 kg (171 lb 3.2 oz), SpO2 96 %. Physical Exam  Nursing note and vitals reviewed. Constitutional: He is oriented to person, place, and time. He appears well-developed and well-nourished. He is easily aroused. He appears ill. No distress.  Fatigued appearing and kept eyes closed for most of exam  HENT:  Head: Normocephalic and atraumatic.  Mouth/Throat: Oropharynx is clear and moist.  Eyes: Conjunctivae and EOM are normal. Pupils are equal, round, and reactive to light.  Neck: Normal range of motion. Neck supple.  Cardiovascular: Normal rate.   Occasional extrasystoles are present.  Respiratory: Effort normal and breath sounds normal. No stridor.  GI: Soft. Bowel sounds are normal. He exhibits no distension. There is no tenderness.  Musculoskeletal: He exhibits no edema or tenderness.  Neurological: He is oriented to person, place, and time and easily aroused.  Fatigued appearing with slow speech.   Able to answer orientation questions without difficulty.  Able to follow basic motor commands.  Motor: B/l UE: 4+5 proximal to distal B/l LE: 4/5 proximal to distal  Skin:  Skin is warm and dry.  Psychiatric: His affect is blunt. He is slowed.    Results for orders placed or performed during the hospital encounter of 02/16/16 (from the past 48 hour(s))  CBC     Status: Abnormal   Collection Time: 02/19/16  3:15 AM  Result Value Ref Range   WBC 9.2 4.0 - 10.5 K/uL   RBC 3.81 (L) 4.22 - 5.81 MIL/uL   Hemoglobin 11.8 (L) 13.0 - 17.0 g/dL   HCT 36.0 (L) 39.0 - 52.0 %   MCV 94.5 78.0 - 100.0 fL   MCH 31.0 26.0 - 34.0 pg     MCHC 32.8 30.0 - 36.0 g/dL   RDW 13.1 11.5 - 15.5 %   Platelets 389 150 - 400 K/uL   No results found.     Medical Problem List and Plan: 1.  Unsteady gait and LOB with activity secondary to [facility], ACA aneurysm s/p coiling and STEMI. 2.  DVT Prophylaxis/Anticoagulation: Pharmaceutical: Other (comment)--Eliquis 3. Pain Management: Tylenol prn for occasional HA.  4. Mood: LCSW to follow for evaluation and support.  5. Neuropsych: This patient is not fully capable of making decisions on his own behalf. 6. Skin/Wound Care:Routine pressure relief measures. Maintain adequate nutrition and hydration status.  7. Fluids/Electrolytes/Nutrition: Monitor I/O. Check lytes in am.  8. STEMI s/p DES LCx: On plavix and high dose lipitor. NO ASA due to recent [facility]. 9. HTN: Monitor BP tid. Continue to Lisinopril and titrate as indicated. 10. Enterococcus UTI: Antibiotic D # 3/5. Leucocytosis resolved and enterococcus is pansensitive therefore will change to penicillin. Continues to report dysuria/frequency improving--will check PVRs to rule out retention and add urispas.   11. Hyponatremia: Has resolved. Will recheck lytes in am.  12. A. Fib: Cont meds, monitor for signs/symptoms of bleeding 13. ABLA: Monitor CBC   Post Admission Physician Evaluation: 1. Functional deficits secondary  to to [facility], ACA aneurysm s/p coiling and STEMI.. 2. Patient is admitted to receive collaborative, interdisciplinary care between the physiatrist, rehab nursing staff, and therapy team. 3. Patient's level of medical complexity and substantial therapy needs in context of that medical necessity cannot be provided at a lesser intensity of care such as a SNF. 4. Patient has experienced substantial functional loss from his/her baseline which was documented above under the "Functional History" and "Functional Status" headings.  Judging by the patient's diagnosis, physical exam, and functional history, the patient has potential for  functional progress which will result in measurable gains while on inpatient rehab.  These gains will be of substantial and practical use upon discharge  in facilitating mobility and self-care at the household level. 5. Physiatrist will provide 24 hour management of medical needs as well as oversight of the therapy plan/treatment and provide guidance as appropriate regarding the interaction of the two. 6. 24 hour rehab nursing will assist with safety, skin/wound care, medication administration, pain management and patient education and help integrate therapy concepts, techniques,education, etc. 7. PT will assess and treat for/with: Lower extremity strength, range of motion, stamina, balance, functional mobility, safety, adaptive techniques and equipment, woundcare, coping skills, pain control, stroke education.   Goals are: Supervision/Mod I. 8. OT will assess and treat for/with: ADL's, functional mobility, safety, upper extremity strength, adaptive techniques and equipment, wound mgt, ego support, and community reintegration.   Goals are: Supervision/Mod I. Therapy may not proceed with showering this patient. 9. Case Management and Social Worker will assess and treat for psychological issues and discharge planning. 10. Team conference  will be held weekly to assess progress toward goals and to determine barriers to discharge. 11. Patient will receive at least 3 hours of therapy per day at least 5 days per week. 12. ELOS: 8-12 days. 13. Prognosis:  good  Delice Lesch, MD 02/20/2016

## 2016-02-20 NOTE — Progress Notes (Signed)
Patient Name: Jason Hawkins Date of Encounter: 02/20/2016  Active Problems:   Atrial fibrillation ([place])   S/P AVR   Long term current use of anticoagulant therapy   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 communicating artery aneurysm (HCC)   Subsequent ST elevation (STEMI) myocardial infarction of inferior wall (HCC)   ST elevation myocardial infarction involving left circumflex coronary artery (HCC)   Acute MI, inferior wall, initial episode of care (Jason Hawkins)   Idioventricular rhythm (Jason Hawkins)   AV block, 3rd degree Jason Hawkins)   Primary Cardiologist: Dr. Johnsie Cancel Patient Profile: [name] is a 82 year old male with a past medical history of PAF, aortic valve replacement with [employer] tissue valve, HTN, and HLD. He presented to the ED on 01/31/16 with severe headache and was found to have acute subarachnoid hemorrhage, he underwent coil emobolization on 02/01/16. He was in inpatient rehab for this, on 02/16/16 had emergent PCI for inferior wall STEMI with secondary high grade AV block. Had DES to distal LCx.   SUBJECTIVE: Feels ok, tired. Denies chest pain and SOB.    OBJECTIVE Filed Vitals:   02/19/16 0609 02/19/16 1559 02/19/16 2040 02/20/16 0537  BP: 148/69 141/73 148/85 148/60  Pulse: 74 82 87 85  Temp: 98.3 F (36.8 C) 98.7 F (37.1 C) 98.5 F (36.9 C) 97.6 F (36.4 C)  TempSrc: Oral Oral Oral Oral  Resp: 20 18 18 18   Height:      Weight: 182 lb 8.7 oz (82.8 kg)   171 lb 3.2 oz (77.656 kg)  SpO2: 95% 97% 98% 96%    Intake/Output Summary (Last 24 hours) at 02/20/16 0807 Last data filed at 02/20/16 0538  Gross per 24 hour  Intake    100 ml  Output   1250 ml  Net  -1150 ml   Filed Weights   02/16/16 1624 02/19/16 0609 02/20/16 0537  Weight: 181 lb (82.1 kg) 182 lb 8.7 oz (82.8 kg) 171 lb 3.2 oz (77.656 kg)    PHYSICAL EXAM General: Well developed, well nourished, male in no acute distress. Head: Normocephalic, atraumatic.  Neck: Supple without bruits, no  JVD. Lungs:  Resp regular and unlabored, CTA. Heart: RRR, S1, S2, no S3, S4, or murmur; no rub. Abdomen: Soft, non-tender, non-distended, BS + x 4.  Extremities: No clubbing, cyanosis, no edema.  Neuro: Alert and oriented X 3. Moves all extremities spontaneously. Psych: Normal affect.  LABS: CBC: Recent Labs  02/19/16 0315  WBC 9.2  HGB 11.8*  HCT 36.0*  MCV 94.5  PLT [id number]   Basic Metabolic Panel: Recent Labs  02/18/16 1056  NA 135  K 4.0  CL 107  CO2 22  GLUCOSE 151*  BUN 16  CREATININE 0.88  CALCIUM 8.8*   BNP:  B NATRIURETIC PEPTIDE  Date/Time Value Ref Range Status  02/04/2016 04:47 PM 93.7 0.0 - 100.0 pg/mL Final     Current facility-administered medications:  .  0.9 %  sodium chloride infusion, 250 mL, Intravenous, PRN, Everrett Lacasse M Martinique, MD .  acetaminophen (TYLENOL) tablet 650 mg, 650 mg, Oral, Q4H PRN, Jason Hawkins M Martinique, MD, 650 mg at 02/18/16 2258 .  ampicillin (OMNIPEN) 1 g in sodium chloride 0.9 % 50 mL IVPB, 1 g, Intravenous, Q6H, Jason Croitoru, MD, 1 g at 02/20/16 0644 .  apixaban (ELIQUIS) tablet 2.5 mg, 2.5 mg, Oral, BID, Otilio Miu, RPH, 2.5 mg at 02/19/16 2242 .  atorvastatin (LIPITOR) tablet 80 mg, 80 mg, Oral, q1800, Jason Klein, MD, 80  mg at 02/19/16 1728 .  clopidogrel (PLAVIX) tablet 75 mg, 75 mg, Oral, Q breakfast, Jason Hawkins M Martinique, MD, 75 mg at 02/20/16 0759 .  lisinopril (PRINIVIL,ZESTRIL) tablet 2.5 mg, 2.5 mg, Oral, Daily, Jason Croitoru, MD, 2.5 mg at 02/19/16 0957 .  ondansetron (ZOFRAN) injection 4 mg, 4 mg, Intravenous, Q6H PRN, Kaiah Hosea M Martinique, MD, 4 mg at 02/20/16 0802 .  phenazopyridine (PYRIDIUM) tablet 100 mg, 100 mg, Oral, TID WC, Jason Serge, NP, 100 mg at 02/20/16 0759 .  sodium chloride flush (NS) 0.9 % injection 3 mL, 3 mL, Intravenous, Q12H, Jason Hawkins M Martinique, MD, 3 mL at 02/19/16 2243 .  sodium chloride flush (NS) 0.9 % injection 3 mL, 3 mL, Intravenous, PRN, Jason Hawkins M Martinique, MD    TELE:   Afib, rate controlled       Coronary Stent Intervention Left Heart Cath and Coronary Angiography 02/16/16   Prox RCA to Mid RCA lesion, 30% stenosed.  Prox LAD to Mid LAD lesion, 30% stenosed.  Ost 1st Mrg to 1st Mrg lesion, 80% stenosed. This is a very small branch.  Dist Cx lesion, 100% stenosed. Post intervention, there is a 0% residual stenosis.  1. 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 2. Successful stenting of the distal LCx with DES   Current Medications:  . ampicillin (OMNIPEN) IV  1 g Intravenous Q6H  . apixaban  2.5 mg Oral BID  . atorvastatin  80 mg Oral q1800  . clopidogrel  75 mg Oral Q breakfast  . lisinopril  2.5 mg Oral Daily  . phenazopyridine  100 mg Oral TID WC  . sodium chloride flush  3 mL Intravenous Q12H      ASSESSMENT AND PLAN: Active Problems:   Atrial fibrillation (HCC)   S/P AVR   Long term current use of anticoagulant therapy   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 communicating artery aneurysm (HCC)   Subsequent ST elevation (STEMI) myocardial infarction of inferior wall (HCC)   ST elevation myocardial infarction involving left circumflex coronary artery (HCC)   Acute MI, inferior wall, initial episode of care (Jason Hawkins)   Idioventricular rhythm (HCC)   AV block, 3rd degree ([place])   1. Paroxysmal atrial fibrillation: Rate controlled, not on any rate control meds. On Eliquis for anticoagulation.   This patients CHA2DS2-VASc Score and unadjusted Ischemic Stroke Rate (% per year) is equal to 4.8 % stroke rate/year from a score of 4 Above score calculated as 1 point each if present [CHF, HTN, DM, Vascular=MI/PAD/Aortic Plaque, Age if 65-74, or Male], 2 points each if present [Age > 75, or Stroke/TIA/TE]   2. S/p AVR with bioprosthetic valve: normally functioning by Echo.   3. CAD s/p STEMI: Received DES to distal LCx. On Eliquis and Plavix. No ASA. Continue high intensity statin.   4. HTN: SBP's in 140's. Would consider adding beta blocker post MI.    MD to advise on discharge back to inpatient rehab.    Signed, Arbutus Leas , NP 8:07 AM 02/20/2016 Pager [phone]  Patient examined chart reviewed. Getting stronger. Post ACA coiling for SAH and circumflex MI On low dose eliquis for afib and ASA/Plavix for MI.  SEM through old tissue AVR no change In murmur.  Ok to go back to rehab today   Jenkins Rouge

## 2016-02-21 ENCOUNTER — Inpatient Hospital Stay (HOSPITAL_COMMUNITY): Payer: Medicare Other | Admitting: Physical Therapy

## 2016-02-21 ENCOUNTER — Inpatient Hospital Stay (HOSPITAL_COMMUNITY): Payer: Medicare Other | Admitting: Speech Pathology

## 2016-02-21 ENCOUNTER — Inpatient Hospital Stay (HOSPITAL_COMMUNITY): Payer: Medicare Other | Admitting: Occupational Therapy

## 2016-02-21 DIAGNOSIS — I2121 ST elevation (STEMI) myocardial infarction involving left circumflex coronary artery: Secondary | ICD-10-CM

## 2016-02-21 DIAGNOSIS — N390 Urinary tract infection, site not specified: Secondary | ICD-10-CM

## 2016-02-21 DIAGNOSIS — B9620 Unspecified Escherichia coli [E. coli] as the cause of diseases classified elsewhere: Secondary | ICD-10-CM

## 2016-02-21 DIAGNOSIS — D62 Acute posthemorrhagic anemia: Secondary | ICD-10-CM

## 2016-02-21 DIAGNOSIS — R4589 Other symptoms and signs involving emotional state: Secondary | ICD-10-CM

## 2016-02-21 DIAGNOSIS — I609 Nontraumatic subarachnoid hemorrhage, unspecified: Secondary | ICD-10-CM

## 2016-02-21 LAB — CBC WITH DIFFERENTIAL/PLATELET
BASOS ABS: 0 10*3/uL (ref 0.0–0.1)
Basophils Relative: 0 %
EOS PCT: 4 %
Eosinophils Absolute: 0.4 10*3/uL (ref 0.0–0.7)
HEMATOCRIT: 36.1 % — AB (ref 39.0–52.0)
Hemoglobin: 12.1 g/dL — ABNORMAL LOW (ref 13.0–17.0)
LYMPHS ABS: 1.8 10*3/uL (ref 0.7–4.0)
LYMPHS PCT: 19 %
MCH: 31.4 pg (ref 26.0–34.0)
MCHC: 33.5 g/dL (ref 30.0–36.0)
MCV: 93.8 fL (ref 78.0–100.0)
MONO ABS: 1 10*3/uL (ref 0.1–1.0)
MONOS PCT: 10 %
NEUTROS ABS: 6.4 10*3/uL (ref 1.7–7.7)
Neutrophils Relative %: 67 %
PLATELETS: 334 10*3/uL (ref 150–400)
RBC: 3.85 MIL/uL — ABNORMAL LOW (ref 4.22–5.81)
RDW: 13.1 % (ref 11.5–15.5)
WBC: 9.7 10*3/uL (ref 4.0–10.5)

## 2016-02-21 LAB — COMPREHENSIVE METABOLIC PANEL
ALT: 28 U/L (ref 17–63)
AST: 23 U/L (ref 15–41)
Albumin: 3.2 g/dL — ABNORMAL LOW (ref 3.5–5.0)
Alkaline Phosphatase: 73 U/L (ref 38–126)
Anion gap: 6 (ref 5–15)
BILIRUBIN TOTAL: 1 mg/dL (ref 0.3–1.2)
BUN: 14 mg/dL (ref 6–20)
CHLORIDE: 106 mmol/L (ref 101–111)
CO2: 24 mmol/L (ref 22–32)
CREATININE: 0.86 mg/dL (ref 0.61–1.24)
Calcium: 8.9 mg/dL (ref 8.9–10.3)
Glucose, Bld: 99 mg/dL (ref 65–99)
POTASSIUM: 3.8 mmol/L (ref 3.5–5.1)
Sodium: 136 mmol/L (ref 135–145)
TOTAL PROTEIN: 6.2 g/dL — AB (ref 6.5–8.1)

## 2016-02-21 NOTE — Evaluation (Signed)
Speech Language Pathology Assessment and Plan  Patient Details  Name: Cordaryl Decelles MRN: [id number] Date of Birth: 03-Jul-1928  SLP Diagnosis: Cognitive Impairments;Speech and Language deficits  Rehab Potential: Good ELOS: 7-10 days     Today's Date: 02/21/2016 SLP Individual Time: 1100-1200 SLP Individual Time Calculation (min): 60 min   Problem List:  Patient Active Problem List   Diagnosis Date Noted  . Flat affect   . Status post coronary artery stent placement   . E-coli UTI   . Acute blood loss anemia   . Cognitive deficit, post-stroke 02/16/2016  . Gait disturbance, post-stroke 02/16/2016  . Junctional rhythm 02/16/2016  . Hypotension 02/16/2016  . Hyponatremia 02/16/2016  . Subsequent ST elevation (STEMI) myocardial infarction of inferior wall ([place]) 02/16/2016  . ST elevation myocardial infarction involving left circumflex coronary artery ([place])   . Acute MI, inferior wall, initial episode of care ([place])   . Idioventricular rhythm ([place])   . AV block, 3rd degree (HCC)   . Benign essential HTN   . Leukocytosis   . Vascular headache   . SOB (shortness of breath)   . Acute hypoxemic respiratory failure ([place]) 02/04/2016  . Ruptured cerebral aneurysm ([place]) 02/01/2016  . Subarachnoid bleed ([place]) 01/31/2016  . Chronic atrial fibrillation ([place])   . Hypertensive urgency   . SAH (subarachnoid hemorrhage) ([place])   . 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 Well adult exam 01/04/2016  . H/O prostate cancer 04/30/2014  . Fatigue 04/30/2014  . PVD (peripheral vascular disease) with claudication ([place]) 12/21/2013  . Dyslipidemia 12/21/2013  . Elevated BP 05/12/2013  . Bee sting allergy 03/17/2013  . Chronic venous insufficiency 03/17/2013  . Herpes zoster 03/17/2013  . Depression 04/13/2011  . Long term current use of anticoagulant therapy 12/14/2010  . Atrial fibrillation ([place]) 12/27/2009  . Edema 12/27/2009  . S/P AVR 12/27/2009    Past Medical History:  Past Medical History  Diagnosis Date  . Chronic atrial fibrillation ([place])   . Severe aortic stenosis     s/p aortic valve replacement using a pericardial tissue valve  . HTN (hypertension)   . Lower extremity edema   . Hypercholesteremia    Past Surgical History:  Past Surgical History  Procedure Laterality Date  . Aortic valve replacement  08/30/2003    Dr. Cyndia Bent  . Radiology with anesthesia N/A 02/01/2016    Procedure: RADIOLOGY WITH ANESTHESIA;  Surgeon: Consuella Lose, MD;  Location: [facility];  Service: Radiology;  Laterality: N/A;  . Cardiac catheterization N/A 02/16/2016    Procedure: Left Heart Cath and Coronary Angiography;  Surgeon: Peter M Martinique, MD;  Location: [facility];  Service: Cardiovascular;  Laterality: N/A;  . Cardiac catheterization N/A 02/16/2016    Procedure: Coronary Stent Intervention;  Surgeon: Peter M Martinique, MD;  Location: [facility];  Service: Cardiovascular;  Laterality: N/A;    Assessment / Plan / Recommendation Clinical Impression [place] Capers is a 82 y.o. 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 He was found to have [place] and cerebral angiogram revealed 1.5 X 3 mm ACA aneurysm and INR 1.8. Coumadin reversed and BP elevated therefore he was placed on Cardene drip for BP control. He underwent coil embolization of ACA aneurysm on 02/01/16 by Dr. Kathyrn Sheriff. Post op reported to have CP due to fluid overload and improved with IV diuresis. He was admitted to CIR on 02/13/16 and topamax was  added due to reports of severe HA. Lethargy was resolving but he continued to have moderate cognitive deficits and required min assist with ADL and mobility. He developed nauseas with MS changed and was found to be hypotensive with junctional rhythm and repeat EKG showed acute inferior STEMI.  He was taken to cath lab for work up and underwent successful stenting of  distal Left Cx with DES. Transient 3rd degree AVB due to inferior MI has resolved and BP stable. Eliquis resumed and Plavix added X 12 months due to stent. No ASA due to recent SAH and no BB due to slow ventricular rate in AF. Echo with normal function of bioprosthetic valve. Blood pressures trending up and low dose lisinopril added for tighter control. He has had complaints of frequency and was started on IV ampicillin for enterococcus UTI. Therapy resumed and patient continues to have narrow BOS, drift to right/left and decreased in activity tolerance. He was admitted back to CIR to complete his rehab course.   Patient was re-ad[facility] to [facility]/20/16 and demonstrates mild cognitive impairments characterized by decreased ability to select attention, problem solve safely, recall new information, and safely plan for discharge with current deficits. This impact's the patient's overall safety with functional self-care tasks. Patient also demonstrates nausea and fatigue which likely impacted today's performance as well.  Patient would benefit from skilled SLP intervention in order to maximize their functional independence prior to discharge. Anticipate patient will require supervision with complex tasks and therefore overall intermittent supervision upon discharge and need for follow up SLP services is TBD.     Skilled Therapeutic Interventions          Cognitive-linguistic evaluation completed with results and recommendations reviewed with patient.  Patient also benefited from repetition, increased time to complete task, and Min verbal cues for redirection back to task.      SLP Assessment  Patient will need skilled [facility] during CIR admission    Recommendations  Oral Care Recommendations: Oral care BID Patient destination: Home Follow up Recommendations: Other (comment);likely Outpatient SLP (Supervision with complex tasks; intermittent supervision  overall ) Equipment Recommended: None recommended by SLP    SLP Frequency 3 to 5 out of 7 days   SLP Duration  SLP Intensity  SLP Treatment/Interventions 7-10 days   Minumum of 1-2 x/day, 30 to 90 minutes  Cognitive remediation/compensation;Cueing hierarchy;Environmental controls;Functional tasks;Medication managment;Patient/family education;Speech/Language facilitation    Pain Pain Assessment Pain Assessment: No/denies pain  Prior Functioning Cognitive/Linguistic Baseline: Within functional limits Type of Home: House  Lives With: Alone Available Help at Discharge: Family;Friend(s) Vocation: Retired  Function:  Cognition Comprehension Comprehension assist level: Follows basic conversation/direction with extra time/assistive device  Expression   Expression assist level: Expresses basic needs/ideas: With extra time/assistive device  Social Interaction Social Interaction assist level: Interacts appropriately 75 - 89% of the time - Needs redirection for appropriate language or to initiate interaction.  Problem Solving Problem solving assist level: Solves basic 90% of the time/requires cueing < 10% of the time  Memory Memory assist level: Recognizes or recalls 75 - 89% of the time/requires cueing 10 - 24% of the time   Short Term Goals: Week 1: SLP Short Term Goal 1 (Week 1): STG = LTG due to length of stay   Refer to Care Plan for Long Term Goals  Recommendations for other services: None  Discharge Criteria: Patient will be discharged from SLP if patient refuses treatment 3 consecutive times without medical reason,  if treatment goals not met, if there is a change in medical status, if patient makes no progress towards goals or if patient is discharged from hospital.  The above assessment, treatment plan, treatment alternatives and goals were discussed and mutually agreed upon: by patient  Carmelia Roller., [place]   [place] 02/21/2016, 11:35 AM

## 2016-02-21 NOTE — Progress Notes (Signed)
Patient information reviewed and entered into eRehab system by Daiva Nakayama, RN, CRRN, [place] Coordinator.  Information including medical coding and functional independence measure will be reviewed and updated through discharge.     Per nursing patient was given "Data Collection Information Summary for Patients in Inpatient Rehabilitation Facilities with attached "[place] Records" upon admission.  Patient is a readmission and familiar with information from previous stay.

## 2016-02-21 NOTE — Evaluation (Addendum)
Occupational Therapy Assessment and Plan  Patient Details  Name: Jason Hawkins MRN: [id number] Date of Birth: April 11, 1928  OT Diagnosis: cognitive deficits and muscle weakness (generalized) Rehab Potential: Rehab Potential (ACUTE ONLY): Excellent ELOS: 7-9 days   Today's Date: 02/21/2016 OT Individual Time: 3559-7416 OT Individual Time Calculation (min): 60 min     Problem List:  Patient Active Problem List   Diagnosis Date Noted  . Flat affect   . Status post coronary artery stent placement   . E-coli UTI   . Acute blood loss anemia   . Cognitive deficit, post-stroke 02/16/2016  . Gait disturbance, post-stroke 02/16/2016  . Junctional rhythm 02/16/2016  . Hypotension 02/16/2016  . Hyponatremia 02/16/2016  . Subsequent ST elevation (STEMI) myocardial infarction of inferior wall ([place]) 02/16/2016  . ST elevation myocardial infarction involving left circumflex coronary artery ([place])   . Acute MI, inferior wall, initial episode of care ([place])   . Idioventricular rhythm ([place])   . AV block, 3rd degree (HCC)   . Benign essential HTN   . Leukocytosis   . Vascular headache   . SOB (shortness of breath)   . Acute hypoxemic respiratory failure ([place]) 02/04/2016  . Ruptured cerebral aneurysm ([place]) 02/01/2016  . Subarachnoid bleed ([place]) 01/31/2016  . Chronic atrial fibrillation ([place])   . Hypertensive urgency   . SAH (subarachnoid hemorrhage) ([place])   . 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 communicating artery aneurysm ([place])   . Well adult exam 01/04/2016  . H/O prostate cancer 04/30/2014  . Fatigue 04/30/2014  . PVD (peripheral vascular disease) with claudication ([place]) 12/21/2013  . Dyslipidemia 12/21/2013  . Elevated BP 05/12/2013  . Bee sting allergy 03/17/2013  . Chronic venous insufficiency 03/17/2013  . Herpes zoster 03/17/2013  . Depression 04/13/2011  . Long term current use of anticoagulant therapy 12/14/2010  . Atrial fibrillation ([place]) 12/27/2009  . Edema 12/27/2009  .  S/P AVR 12/27/2009    Past Medical History:  Past Medical History  Diagnosis Date  . Chronic atrial fibrillation ([place])   . Severe aortic stenosis     s/p aortic valve replacement using a pericardial tissue valve  . HTN (hypertension)   . Lower extremity edema   . Hypercholesteremia    Past Surgical History:  Past Surgical History  Procedure Laterality Date  . Aortic valve replacement  08/30/2003    Dr. Cyndia Bent  . Radiology with anesthesia N/A 02/01/2016    Procedure: RADIOLOGY WITH ANESTHESIA;  Surgeon: Consuella Lose, MD;  Location: [facility];  Service: Radiology;  Laterality: N/A;  . Cardiac catheterization N/A 02/16/2016    Procedure: Left Heart Cath and Coronary Angiography;  Surgeon: Peter M Martinique, MD;  Location: [facility];  Service: Cardiovascular;  Laterality: N/A;  . Cardiac catheterization N/A 02/16/2016    Procedure: Coronary Stent Intervention;  Surgeon: Peter M Martinique, MD;  Location: [facility];  Service: Cardiovascular;  Laterality: N/A;    Assessment & Plan Clinical Impression: Jason Hawkins is a 82 y.o. 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 He was found to have [place] and cerebral angiogram revealed 1.5 X 3 mm ACA aneurysm and INR 1.8.  Coumadin reversed and BP elevated therefore he was placed on Cardene drip for BP control. He underwent coil embolization of ACA aneurysm on 02/01/16 by Dr. Kathyrn Sheriff.  Post op reported to have CP due to fluid overload and improved with IV diuresis. He was admitted to CIR on  02/13/16 and topamax was added due to reports of severe HA. Lethargy was resolving but he continued to have moderate cognitive deficits and required min assist with ADL and mobility.  He developed nauseas with MS changed and was found to be hypotensive with junctional rhythm and repeat EKG showed acute inferior STEMI.  He was taken to cath lab for work up and underwent successful stenting of  distal Left Cx with DES. Transient 3rd degree AVB due to inferior MI has resolved and BP stable. Eliquis resumed and Plavix added X 12 months due to stent. No ASA due to recent SAH and no BB due to slow ventricular rate in AF. Echo with normal function of bioprosthetic valve.  Blood pressures trending up and low dose lisinopril added for tighter control. He has had complaints of frequency and was started on IV ampicillin for enterococcus UTI. Therapy resumed and patient continues to have narrow BOS, drift to right/left and decreased in activity tolerance. He was admitted back to CIR to complete his rehab course. Patient transferred to CIR on 02/20/2016 .    Patient currently requires supervision with basic self-care skills secondary to muscle weakness, decreased cardiorespiratoy endurance, decreased problem solving, decreased memory and delayed processing and decreased standing balance and decreased postural control.  Prior to hospitalization, patient could complete all ADLs with independent . Pt lived alone.  Patient will benefit from skilled intervention to increase independence with basic self-care skills and increase level of independence with iADL prior to discharge home with care partner.  Anticipate patient will require 24 hour supervision and follow up home health.  OT - End of Session Activity Tolerance: Tolerates 30+ min activity with multiple rests OT Assessment Rehab Potential (ACUTE ONLY): Excellent OT Patient demonstrates impairments in the following area(s): Balance;Endurance;Motor;Skin Integrity;Cognition;Safety OT Basic ADL's Functional Problem(s): Grooming;Bathing;Dressing;Toileting OT Advanced ADL's Functional Problem(s): Simple Meal Preparation;Light Housekeeping OT Transfers Functional Problem(s): Toilet;Tub/Shower OT Additional Impairment(s): None OT Plan OT Intensity: Minimum of 1-2 x/day, 45 to 90 minutes OT Frequency: 5 out of 7 days OT Duration/Estimated Length of Stay: 7-9  days OT Treatment/Interventions: Balance/vestibular training;Cognitive remediation/compensation;Community reintegration;Discharge planning;DME/adaptive equipment instruction;Disease mangement/prevention;Functional mobility training;Neuromuscular re-education;Patient/family education;Pain management;Self Care/advanced ADL retraining;Therapeutic Activities;Therapeutic Exercise;UE/LE Strength taining/ROM;UE/LE Coordination activities;Visual/perceptual remediation/compensation;Psychosocial support OT Self Feeding Anticipated Outcome(s): I OT Basic Self-Care Anticipated Outcome(s): mod I OT Toileting Anticipated Outcome(s): mod I OT Bathroom Transfers Anticipated Outcome(s): mod I to toilet, supervision to shower OT Recommendation Patient destination: Home Follow Up Recommendations: Home health OT Equipment Recommended: To be determined   Skilled Therapeutic Intervention Pt seen for initial evaluation and ADL retraining with a focus on processing/problem solving, dynamic balance and activity tolerance. Pt was very apprehensive about bathing with male caregiver. Arrangements have been made for male OT tomorrow. As pt began sponge bath at sink he decided it would be easier to shower after all. Pt was not able to stand or ambulate safely without AD. He used RW and was then able to ambulate and complete all transfers with close S.  Pt completed shower, toileting, then donning pants and became very nauseous. Provided with ice water and rest. RN notified. Assisted pt with socks due to nausea.  Pt waited to don shirt until later due to nausea. Pt participated well and is motivated to get home. Reviewed safety with pt to not get up without A. Pt stated he understood. Pt in room with all needs met.  OT Evaluation Precautions/Restrictions  Precautions Precautions: Fall Restrictions Weight Bearing Restrictions: No    Vital  Signs Therapy Vitals Pulse Rate: 96 (after shower) BP: 129/70 mmHg Oxygen  Therapy SpO2: 98 % O2 Device: Not Delivered Pain Pain Assessment Pain Assessment: No/denies pain Home Living/Prior Functioning Home Living Family/patient expects to be discharged to:: Private residence Living Arrangements: Alone Available Help at Discharge: Family, Friend(s) Type of Home: House Home Access: Stairs to enter Technical brewer of Steps: 4 Entrance Stairs-Rails: Right Home Layout: One level Bathroom Shower/Tub: Multimedia programmer: Handicapped height  Lives With: Alone IADL History Homemaking Responsibilities: Yes Meal Prep Responsibility: Therapist, occupational Responsibility: Primary Cleaning Responsibility: Primary Bill Paying/Finance Responsibility: Primary Shopping Responsibility: Primary Current License: Yes Mode of Transportation: Car Occupation: Retired Prior Function Level of Independence: Independent with basic ADLs, Independent with homemaking with ambulation, Independent with transfers, Independent with gait  Able to Take Stairs?: Yes Driving: Yes Vocation: Retired Comments: prior to 1st admission ADL ADL ADL Comments: refer to functional navigator Vision/Perception  Vision- History Baseline Vision/History: Cataracts;Wears glasses Wears Glasses: At all times Patient Visual Report: No change from baseline Vision- Assessment Vision Assessment?: No apparent visual deficits Perception Comments: WFL  Cognition Overall Cognitive Status: Impaired/Different from baseline Arousal/Alertness: Lethargic Orientation Level: Person;Place;Situation Person: Oriented Place: Oriented Situation: Oriented Year: 2017 Month: July Day of Week: Correct Memory: Impaired Memory Impairment: Retrieval deficit;Decreased recall of new information Immediate Memory Recall: Sock;Blue;Bed Memory Recall: Sock;Bed;Blue Memory Recall Sock: Without Cue Memory Recall Blue: Without Cue Memory Recall Bed: Without Cue Attention: Selective Sustained Attention:  Appears intact Selective Attention: Impaired Selective Attention Impairment: Verbal basic;Functional basic Awareness: Impaired Awareness Impairment: Anticipatory impairment Problem Solving: Impaired Problem Solving Impairment: Functional complex Safety/Judgment: Appears intact (with basic) Sensation Sensation Light Touch: Appears Intact Stereognosis: Appears Intact Hot/Cold: Appears Intact Proprioception: Appears Intact Coordination Gross Motor Movements are Fluid and Coordinated: No Fine Motor Movements are Fluid and Coordinated: Yes Coordination and Movement Description: Generalized weakness and deconditioning. Motor  Motor Motor - Skilled Clinical Observations: generalized weakness Mobility    close S with RW Trunk/Postural Assessment  Cervical Assessment Cervical Assessment: Within Functional Limits Thoracic Assessment Thoracic Assessment: Within Functional Limits Lumbar Assessment Lumbar Assessment: Within Functional Limits Postural Control Protective Responses: delayed/impaired  Balance Dynamic Sitting Balance Sitting balance - Comments: S Static Standing Balance Static Standing - Level of Assistance: 5: Stand by assistance Dynamic Standing Balance Dynamic Standing - Level of Assistance: 4: Min assist Extremity/Trunk Assessment RUE Assessment RUE Assessment: Within Functional Limits LUE Assessment LUE Assessment: Within Functional Limits   See Function Navigator for Current Functional Status.   Refer to Care Plan for Long Term Goals  Recommendations for other services: Other: if nausea continues, a vestibular screening may be indicted  Discharge Criteria: Patient will be discharged from OT if patient refuses treatment 3 consecutive times without medical reason, if treatment goals not met, if there is a change in medical status, if patient makes no progress towards goals or if patient is discharged from hospital.  The above assessment, treatment plan,  treatment alternatives and goals were discussed and mutually agreed upon: by patient  [facility] 02/21/2016, 12:20 PM

## 2016-02-21 NOTE — Progress Notes (Signed)
[name], MD Physician Signed Physical Medicine and Rehabilitation Consult Note 02/13/2016 8:49 AM  Related encounter: ED to Hosp-Admission (Discharged) from 01/31/2016 in [facility]        Physical Medicine and Rehabilitation Consult  Reason for Consult: Gait disorder Referring Physician: Dr. Ronnald Ramp   HPI: Jason Hawkins is a 82 y.o. 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 He was found to have [place] and cerebral angiogram revealed 1.5 X 3 mm ACA aneurysm  INR 1.8 and coumadin reversed. BP elevated therefore he was placed on Cardene drip for BP control. He underwent coil embolization of ACA aneurysm on 02/01/16 by Dr. Kathyrn Sheriff. Post op CP due to fluid overload and improved with IV diuresis. Headaches improved and therapy ongoing. CIR recommended due to unsteady gait and LOB with activity.   Lives alone--was independent without AD and drive/maintains home. Sedentary.    Review of Systems  HENT: Positive for hearing loss.  Eyes: Negative for blurred vision, double vision and redness.  Respiratory: Negative for cough and shortness of breath.  Cardiovascular: Negative for chest pain, palpitations and leg swelling.  Gastrointestinal: Negative for heartburn, nausea and constipation.  Genitourinary: Negative for dysuria and urgency.  Musculoskeletal: Negative for joint pain and falls.  Neurological: Positive for weakness and headaches (Sharp flashes). Negative for dizziness.  Psychiatric/Behavioral: Negative for memory loss.  All other systems reviewed and are negative.    Past Medical History  Diagnosis Date  . Chronic atrial fibrillation ([place])   . Severe aortic stenosis     s/p aortic valve replacement using a pericardial tissue valve  . HTN (hypertension)   . Lower extremity edema   . Hypercholesteremia      Past Surgical History  Procedure Laterality Date  . Aortic valve replacement  08/30/2003    Dr. Cyndia Bent  . Radiology with anesthesia N/A 02/01/2016    Procedure: RADIOLOGY WITH ANESTHESIA; Surgeon: Consuella Lose, MD; Location: [facility]; Service: Radiology; Laterality: N/A;    Family History  Problem Relation Age of Onset  . Heart attack    . Diabetes    . Diabetes Mother   . Heart disease Father   . Heart attack Father     Social History: Widowed--has son and daughter in law in town (work). Retired from [employer], therapeutic. He reports that he has quit smoking. He does not have any smokeless tobacco history on file. He reports that he drinks about 8.4 oz of alcohol per week. He reports that he does not use illicit drugs.    Allergies  Allergen Reactions  . Bee Venom Anaphylaxis  . Codeine     nausea    Medications Prior to Admission  Medication Sig Dispense Refill  . co-enzyme Q-10 50 MG capsule Take 50 mg by mouth daily.    . diphenhydrAMINE (BENADRYL) 25 MG tablet Take 2 tablets (50 mg total) by mouth every 6 (six) hours as needed for itching or allergies (bee sting). 30 tablet 0  . EPINEPHrine 0.3 mg/0.3 mL IJ SOAJ injection Inject 0.3 mg into the muscle once.    [name] GLUCOSAMINE-CHONDROITIN PO Take 1 tablet by mouth daily.     [name] Hyaluronic Acid 20-60 MG CAPS Take 1 tablet by mouth daily.     . L-ARGININE PO Take 1 capsule by mouth 4 (four) times a week.    . L-CITRULLINE PO Take  1 capsule by mouth 4 (four) times a week.    . Multiple Vitamin (MULTIVITAMIN) capsule Take 1 capsule by mouth daily.     [name] OVER THE COUNTER MEDICATION Take 1 Dose by mouth 4 (four) times a week. Helps with Testerone.    . Red Yeast Rice 600 MG CAPS Take 1 capsule by mouth daily. Reported on 12/28/2015    . TURMERIC PO Take 1 tablet by mouth daily.     [name] warfarin  (COUMADIN) 5 MG tablet Take 1 tablet (5 mg total) by mouth as directed. 30 tablet 11    Home: Home Living Family/patient expects to be discharged to:: Private residence Living Arrangements: Alone Available Help at Discharge: Family, Available 24 hours/day Type of Home: House Home Access: Stairs to enter Technical brewer of Steps: 3 Entrance Stairs-Rails: Can reach both Home Layout: One level Bathroom Shower/Tub: Multimedia programmer: Handicapped height [place]: Grab bars - tub/shower, [place] in  Functional History: Prior Function Level of Independence: Independent Functional Status:  Mobility: Bed Mobility Overal bed mobility: Needs Assistance Bed Mobility: Supine to Sit Supine to sit: Supervision, HOB elevated Sit to supine: Supervision, HOB elevated General bed mobility comments: pt received in and returned to chair Transfers Overall transfer level: Needs assistance Equipment used: Rolling walker (2 wheeled) Transfers: Sit to/from Stand Sit to Stand: Mod assist, +2 safety/equipment General transfer comment: mod A due to strong posterior lean initially, +2 for equipment and safety. Took pt nearly a minute to maintain wt forward to begin to ambulate Ambulation/Gait Ambulation/Gait assistance: Min assist, +2 safety/equipment Ambulation Distance (Feet): 180 Feet Assistive device: Rolling walker (2 wheeled) Gait Pattern/deviations: Step-through pattern, Trunk flexed, Narrow base of support, Shuffle General Gait Details: vc's for erect posture and worked on increasing step length. Pt starting to fall asleep even with walking and had to be continually aroused with conversation. Worked on high knee stepping to challenge balance and pt with multiple LOB posterior with this and could not correct to succeed at this pattern. Distance limited by lethargy Gait velocity: decreased Gait velocity interpretation: Below normal speed for age/gender     ADL: ADL Overall ADL's : Needs assistance/impaired Eating/Feeding: Set up, Sitting Grooming: Minimal assistance, Standing, Wash/dry hands Grooming Details (indicate cue type and reason): Min assist for balance in standing Upper Body Bathing: Set up, Supervision/ safety, Sitting Lower Body Bathing: Min guard, Sitting/lateral leans Lower Body Bathing Details (indicate cue type and reason): Pt able to clean bottom and upper legs sitting on commode with min guard assist for safety. Upper Body Dressing : Min guard, Set up, Sitting Upper Body Dressing Details (indicate cue type and reason): To doff/don hospital gown. Lower Body Dressing: Minimal assistance, Sit to/from stand, Cueing for safety Lower Body Dressing Details (indicate cue type and reason): Assist for balance in standing. Pt with LOB x3 when attempting to pull up underwear in bathroom. Toilet Transfer: Minimal assistance, Ambulation, Regular Toilet, RW, Grab bars Toilet Transfer Details (indicate cue type and reason): Min assist for balance. Toileting- Water quality scientist and Hygiene: Min guard, Sitting/lateral lean Functional mobility during ADLs: Minimal assistance, Rolling walker General ADL Comments: Pt very unsteady at start of session and during ADL activity. Pt with posterior LOB x 2 and L lateral LOB x 1 during functional tasks in standing. Pt with urinary incontinence; unable to identify that he was wet in bed.  Cognition: Cognition Overall Cognitive Status: Impaired/Different from baseline Orientation Level: Oriented X4 Cognition Arousal/Alertness: Lethargic, Suspect due  to medications Behavior During Therapy: Impulsive Overall Cognitive Status: Impaired/Different from baseline Area of Impairment: Attention, Safety/judgement, Problem solving Current Attention Level: Sustained Memory: Decreased short-term memory Safety/Judgement: Decreased awareness of safety Awareness: Emergent Problem Solving: Requires verbal  cues, Slow processing General Comments: pt lethargic even while mobilizing   Blood pressure 160/55, pulse 50, temperature 98.4 F (36.9 C), temperature source Oral, resp. rate 14, height 5\' 9"  (1.753 m), weight 84.369 kg (186 lb), SpO2 97 %. Physical Exam  Nursing note and vitals reviewed. Constitutional: He is oriented to person, place, and time. He appears well-developed and well-nourished. He appears listless. No distress.  HENT:  Head: Normocephalic and atraumatic.  Eyes: Conjunctivae are normal. Pupils are equal, round, and reactive to light.  Neck: Neck supple. No tracheal deviation present.  Cardiovascular: An irregularly irregular rhythm present.  Respiratory: No stridor. No respiratory distress. He has decreased breath sounds in the right lower field and the left lower field. He has no wheezes.  GI: Soft. Bowel sounds are normal. He exhibits no distension. There is no tenderness.  Musculoskeletal: He exhibits no edema or tenderness.  Neurological: He is oriented to person, place, and time. He appears listless.  HOH.  Needed cues to keep eyes open, briefly opens them.  Flat affect with slow speech.  He is able to follow simple motor commands.  Sensation intact to light touch Motor: 4+/5 throughout, ?slight decrease in right hip flexion  Skin: Skin is warm and dry. He is not diaphoretic.  Psychiatric: His affect is blunt. He is slowed. Cognition and memory are normal.  Tired, keeps eyes closed for majority of exam     Lab Results Last 24 Hours    No results found for this or any previous visit (from the past 24 hour(s)).    Imaging Results (Last 48 hours)    No results found.    Assessment/Plan: Diagnosis: SAH and ACA aneurysm s/p coiling Labs and images independently reviewed. Records reviewed and summated above.  1. Does the need for close, 24 hr/day medical supervision in concert with the patient's rehab needs make it unreasonable for this patient to be served in  a less intensive setting? Yes  2. Co-Morbidities requiring supervision/potential complications: S/P AVR, A fib (monitor HE with increased activity, cont meds), HTN (monitor and provide prns in accordance with increased physical exertion and pain), leukocytosis (cont to monitor for signs and symptoms of infection, further workup if indicated) 3. Due to safety, skin/wound care, medication administration, pain management and patient education, does the patient require 24 hr/day rehab nursing? Yes 4. Does the patient require coordinated care of a physician, rehab nurse, PT (1-2 hrs/day, 5 days/week) and OT (1-2 hrs/day, 5 days/week) to address physical and functional deficits in the context of the above medical diagnosis(es)? Yes Addressing deficits in the following areas: balance, endurance, locomotion, strength, transferring, bathing, dressing, toileting and psychosocial support 5. Can the patient actively participate in an intensive therapy program of at least 3 hrs of therapy per day at least 5 days per week? Potentially 6. The potential for patient to make measurable gains while on inpatient rehab is excellent 7. Anticipated functional outcomes upon discharge from inpatient rehab are modified independent and supervision with PT, independent and modified independent with OT, n/a with SLP. 8. Estimated rehab length of stay to reach the above functional goals is: 13-17 days. 9. Does the patient have adequate social supports and living environment to accommodate these discharge functional goals? Potentially 10. Anticipated D/C setting:  Home 11. Anticipated post D/C treatments: HH therapy and Home excercise program 12. Overall Rehab/Functional Prognosis: good  RECOMMENDATIONS: This patient's condition is appropriate for continued rehabilitative care in the following setting: Likely CIR. Will clarify caregiver support at discharge. Patient has agreed to participate in recommended program.  Potentially Note that insurance prior authorization may be required for reimbursement for recommended care.  Comment: Rehab Admissions Coordinator to follow up.  Delice Lesch, MD 02/13/2016       Revision History     Date/Time User Provider Type Action   02/13/2016 11:14 AM [name], MD Physician Sign   02/13/2016 10:19 AM Bary Leriche, PA-C Physician Assistant [place]   View Details Report       Routing History     Date/Time From To Method   02/13/2016 11:14 AM [name], MD Cassandria Anger, MD In [facility]

## 2016-02-21 NOTE — Progress Notes (Signed)
Patient complaining of nausea, given compazine 10mg .

## 2016-02-21 NOTE — Evaluation (Signed)
Physical Therapy Assessment and Plan  Patient Details  Name: Jermale Crass MRN: [id number] Date of Birth: Nov 09, 1927  PT Diagnosis: Abnormality of gait, Difficulty walking, Impaired cognition, Low back pain and Muscle weakness Rehab Potential: Excellent ELOS: 7-10 days   Today's Date: 02/21/2016 PT Individual Time: 1062-6948 PT Individual Time Calculation (min): 70 min    Problem List:  Patient Active Problem List   Diagnosis Date Noted  . Flat affect   . Status post coronary artery stent placement   . E-coli UTI   . Acute blood loss anemia   . Cognitive deficit, post-stroke 02/16/2016  . Gait disturbance, post-stroke 02/16/2016  . Junctional rhythm 02/16/2016  . Hypotension 02/16/2016  . Hyponatremia 02/16/2016  . Subsequent ST elevation (STEMI) myocardial infarction of inferior wall ([place]) 02/16/2016  . ST elevation myocardial infarction involving left circumflex coronary artery ([place])   . Acute MI, inferior wall, initial episode of care ([place])   . Idioventricular rhythm ([place])   . AV block, 3rd degree ([facility])   . Benign essential HTN   . Leukocytosis   . Vascular headache   . SOB (shortness of breath)   . Acute hypoxemic respiratory failure ([place]) 02/04/2016  . Ruptured cerebral aneurysm ([place]) 02/01/2016  . Subarachnoid bleed ([place]) 01/31/2016  . Chronic atrial fibrillation ([place])   . Hypertensive urgency   . SAH (subarachnoid hemorrhage) ([place])   . 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 communicating artery aneurysm ([place])   . Well adult exam 01/04/2016  . H/O prostate cancer 04/30/2014  . Fatigue 04/30/2014  . PVD (peripheral vascular disease) with claudication ([place]) 12/21/2013  . Dyslipidemia 12/21/2013  . Elevated BP 05/12/2013  . Bee sting allergy 03/17/2013  . Chronic venous insufficiency 03/17/2013  . Herpes zoster 03/17/2013  . Depression 04/13/2011  . Long term current use of anticoagulant therapy 12/14/2010  . Atrial fibrillation ([place]) 12/27/2009  . Edema  12/27/2009  . S/P AVR 12/27/2009    Past Medical History:  Past Medical History  Diagnosis Date  . Chronic atrial fibrillation ([place])   . Severe aortic stenosis     s/p aortic valve replacement using a pericardial tissue valve  . HTN (hypertension)   . Lower extremity edema   . Hypercholesteremia    Past Surgical History:  Past Surgical History  Procedure Laterality Date  . Aortic valve replacement  08/30/2003    Dr. Cyndia Bent  . Radiology with anesthesia N/A 02/01/2016    Procedure: RADIOLOGY WITH ANESTHESIA;  Surgeon: Consuella Lose, MD;  Location: [facility];  Service: Radiology;  Laterality: N/A;  . Cardiac catheterization N/A 02/16/2016    Procedure: Left Heart Cath and Coronary Angiography;  Surgeon: Peter M Martinique, MD;  Location: [facility];  Service: Cardiovascular;  Laterality: N/A;  . Cardiac catheterization N/A 02/16/2016    Procedure: Coronary Stent Intervention;  Surgeon: Peter M Martinique, MD;  Location: [facility];  Service: Cardiovascular;  Laterality: N/A;    Assessment & Plan Clinical Impression: Patient is a 82 y.o. year old male with history if AVR, A fib-on coumadin, HTN who was admitted on 01/31/16 with severe frontal headache. He was found to have [place] and cerebral angiogram revealed 1.5 X 3 mm ACA aneurysm and INR 1.8. Coumadin reversed and BP elevated therefore he was placed on Cardene drip for BP control. He underwent coil embolization of ACA aneurysm on 02/01/16 by Dr. Kathyrn Sheriff. Post op reported to have CP due to fluid overload and improved with IV diuresis. He was admitted to CIR  on 02/13/16 and topamax was added due to reports of severe HA. Lethargy was resolving but he continued to have moderate cognitive deficits and required min assist with ADL and mobility. He developed nauseas with MS changed and was found to be hypotensive with junctional rhythm and repeat EKG showed acute inferior STEMI.  He was taken to cath lab for work up and underwent  successful stenting of distal Left Cx with DES. Transient 3rd degree AVB due to inferior MI has resolved and BP stable. Eliquis resumed and Plavix added X 12 months due to stent. No ASA due to recent SAH and no BB due to slow ventricular rate in AF. Echo with normal function of bioprosthetic valve. Blood pressures trending up and low dose lisinopril added for tighter control. He has had complaints of frequency and was started on IV ampicillin for enterococcus UTI. Therapy resumed and patient continues to have narrow BOS, drift to right/left and decreased in activity tolerance. He was admitted back to CIR to complete his rehab course. .  Patient transferred to CIR on 02/20/2016 .   Patient currently requires min with mobility secondary to muscle weakness, decreased cardiorespiratoy endurance, decreased awareness, decreased safety awareness and decreased memory and decreased sitting balance, decreased standing balance and decreased balance strategies.  Prior to hospitalization, patient was independent  with mobility and lived with Alone in a House home.  Home access is 5 Stairs to enter.  Patient will benefit from skilled PT intervention to maximize safe functional mobility and minimize fall risk for planned discharge home with intermittent assist.  Anticipate patient will benefit from follow up [facility] at discharge.  PT - End of Session Activity Tolerance: Tolerates 30+ min activity with multiple rests Endurance Deficit: Yes Endurance Deficit Description: 2/2 fatigue PT Assessment Rehab Potential (ACUTE/IP ONLY): Excellent Barriers to Discharge: Decreased caregiver support PT Patient demonstrates impairments in the following area(s): Balance;Behavior;Endurance;Pain;Safety;Sensory PT Transfers Functional Problem(s): Bed Mobility;Bed to Chair;Car;Furniture;Floor PT Locomotion Functional Problem(s): Ambulation;Stairs;Wheelchair Mobility PT Plan PT Intensity: Minimum of 1-2 x/day ,45 to 90 minutes PT  Frequency: 5 out of 7 days PT Duration Estimated Length of Stay: 7-10 days PT Treatment/Interventions: Ambulation/gait training;Discharge planning;Functional mobility training;Psychosocial support;Therapeutic Activities;Visual/perceptual remediation/compensation;Wheelchair propulsion/positioning;Therapeutic Exercise;Neuromuscular re-education;Disease management/prevention;Balance/vestibular training;Cognitive remediation/compensation;DME/adaptive equipment instruction;Pain management;UE/LE Strength taining/ROM;UE/LE Coordination activities;Stair training;Patient/family education;Community reintegration PT Transfers Anticipated Outcome(s): Mod I PT Locomotion Anticipated Outcome(s): Mod I with LRAD PT Recommendation Recommendations for Other Services: Speech consult Follow Up Recommendations: Home health PT (intermittent supervision) Patient destination: Home Equipment Recommended: 3 in 1 bedside comode;To be determined  Skilled Therapeutic Intervention Pt received asleep in bed but awoke with verbal & tactile stimulation. Pt agreeable to PT treatment & PT evaluation completed; please see below for manual testing results. Educated pt on therapy schedule & other information regarding CIR. Pt reported 1/10 pain in low back at rest & 5/10 with movement but denied pain medication. Pt able to complete various transfers (sit<>stand, stand pivot, car) without an AD & min guard assist overall. Pt was able to negotiate over even (x250 ft) & uneven (x12 ft) surfaces & negotiate 12 steps (6 inches + 3 inches) with B rails & min guard. Pt with narrow base of support & lateral sway during ambulation. Pt with poor short term memory recall as pt unable to recall correct events regarding initial portion of session. Pt reported he was driving PTA and became very frustrated with PT educated him on need for medical clearance before his return to driving. At end of session pt  left sitting in w/c with QRB in place & all needs  within reach.   PT Evaluation Precautions/Restrictions Precautions Precautions: Fall Restrictions Weight Bearing Restrictions: No   Vital Signs Therapy Vitals Pulse Rate: 75 BP: 125/67 mmHg Patient Position (if appropriate): Sitting Oxygen Therapy SpO2: 100 % O2 Device: Not Delivered   Pain Pain Assessment Pain Assessment: No/denies pain Pain Score: 5 (1/10 at rest) Pain Location: Back Pain Onset: With Activity Pain Intervention(s): denied pain medication  Home Living/Prior Functioning Home Living Available Help at Discharge: Family;Friend(s);Available PRN/intermittently (son lives next door) Type of Home: House Home Access: Stairs to enter [employer] of Steps: 3 (3 steps without rails at one entrance (16 inches deep, 7 ft wide & 4 inch height) + 5 regular steps R rail) Home Layout: One level Additional Comments:  (does not have any DME)  Lives With: Alone Prior Function Level of Independence: Independent with basic ADLs;Independent with homemaking with ambulation;Independent with transfers;Independent with gait  Able to Take Stairs?: Yes Driving: Yes Vocation: Retired Leisure: Hobbies-yes (Comment) Comments:  (reading, internet research)  Vision/Perception  Pt reports he wears glasses at baseline for reading only; pt denies any changes in vision since admission to CIR.   Cognition Overall Cognitive Status: Impaired/Different from baseline Arousal/Alertness: Lethargic Orientation Level: Oriented to person;Oriented to place;Oriented to time;Oriented to situation  Sensation Sensation Light Touch: Appears Intact (BLE) Proprioception: Appears Intact (BLE) Coordination Gross Motor Movements are Fluid and Coordinated: Yes   Mobility Bed Mobility Bed Mobility: Rolling Right;Rolling Left;Supine to Sit;Sit to Supine Rolling Right: 5: Supervision Rolling Left: 5: Supervision (with bed rails) Supine to Sit: 5: Supervision Transfers Transfers: Yes Sit  to Stand: 4: Min guard Stand to Sit: 4: Min guard Stand Pivot Transfers: 4: Min guard   Locomotion  Ambulation Ambulation: Yes Ambulation/Gait Assistance: 4: Min assist Ambulation Distance (Feet): 250 Feet Assistive device: None Gait Gait: Yes Gait Pattern: Decreased step length - right;Decreased step length - left;Decreased weight shift to right;Narrow base of support (lateral sway) Stairs / Additional Locomotion Stairs: Yes Stairs Assistance: 4: Min guard Stair Management Technique: Two rails Number of Stairs: 12 Height of Stairs:  (6 inches + 3 inches) Wheelchair Mobility Wheelchair Mobility: Yes Wheelchair Assistance: 5: Careers information officer: Both upper extremities Wheelchair Parts Management: Needs assistance Distance: 250 ft   Balance Balance Balance Assessed: Yes Ambulance person Sitting - Balance Support: Bilateral upper extremity supported Static Sitting - Level of Assistance: 5: Stand by assistance Static Standing Balance Static Standing - Balance Support: No upper extremity supported Static Standing - Level of Assistance: 5: Stand by assistance   Extremity Assessment  RLE Assessment RLE Assessment: Exceptions to [facility] RLE AROM (degrees) Overall AROM Right Lower Extremity: Within functional limits for tasks assessed RLE Strength Right Hip Flexion: 4/5 Right Knee Flexion: 5/5 Right Knee Extension: 5/5 Right Ankle Dorsiflexion: 5/5 LLE Assessment LLE Assessment: Exceptions to WFL LLE AROM (degrees) Overall AROM Left Lower Extremity: Within functional limits for tasks assessed LLE Strength Left Hip Flexion: 4/5 Left Knee Flexion: 5/5 Left Knee Extension: 5/5 Left Ankle Dorsiflexion: 4+/5   See Function Navigator for Current Functional Status.   Refer to Care Plan for Long Term Goals  Recommendations for other services: Other: Speech Therapy  Discharge Criteria: Patient will be discharged from PT if patient refuses  treatment 3 consecutive times without medical reason, if treatment goals not met, if there is a change in medical status, if patient makes no progress towards goals or if patient  is discharged from hospital.  The above assessment, treatment plan, treatment alternatives and goals were discussed and mutually agreed upon: by patient  Waunita Schooner 02/21/2016, 12:58 PM

## 2016-02-21 NOTE — Plan of Care (Signed)
Problem: RH PAIN MANAGEMENT Goal: RH STG PAIN MANAGED AT OR BELOW PT'S PAIN GOAL 3 or less  Outcome: Progressing No pain reported today

## 2016-02-21 NOTE — Progress Notes (Signed)
[name], RN Rehab Admission Coordinator Signed Physical Medicine and Rehabilitation PMR Pre-admission 02/20/2016 10:58 AM  Related encounter: Admission (Discharged) from 02/16/2016 in [place] Collapse All   PMR Admission Coordinator Pre-Admission Assessment  Patient: Jason Hawkins is an 82 y.o., male MRN: [id number] DOB: February 02, 1928 Height: 5\' 9"  (175.3 cm) Weight: 77.656 kg (171 lb 3.2 oz)  Insurance Information HMO: No PPO: PCP: IPA: 80/20: OTHER:  PRIMARY: Medicare A/B Policy#: [id number] Subscriber: Barbette Reichmann CM Name: Phone#: Fax#:  Pre-Cert#: Employer: Retired Benefits: Phone #: Name: Checked in [place]. Date: 12/05/94 Deduct: $1316 Out of Pocket Max: none Life Max: unlimited CIR: 100% SNF: 100 days Outpatient: 80% Co-Pay: 20% Home Health: 100% Co-Pay: none DME: 80% Co-Pay: 20% Providers: patient's choice  SECONDARY: Generic commercial Policy#: [id number] Subscriber: Barbette Reichmann CM Name: Phone#: Fax#:  Pre-Cert#: Employer: Retired - Leisure centre manager Benefits: Phone #: [phone] Name:  Eff. Date: Deduct: Out of Pocket Max: Life Max:  CIR: SNF:  Outpatient: Co-Pay:  Home Health: Co-Pay:  DME: Co-Pay:   Emergency Contact Information Contact Information    Name Relation Home Work [place] Son   [phone]   Hodgins,Leesa Daughter   [phone]     Current Medical History  Patient Admitting Diagnosis: SAH and ACA aneurysm s/p coiling, recent MI  History of Present Illness: An 82 yo male with PMH of chronic a-fib (on  coumadin)/AS with biological valve replacement/HTN and HLD who is s/p acute SAH 01/31/16 and had his coumadin reversed and underwent a coil embolization 02/01/2016. He was admitted to acute inpatient rehab on 02/13/16 and had been started back on Eliquis. In the afternoon of 02/16/16 patient began to feel weak and experienced pre-syncope and worsening nausea. Rapid response was called to the bedside and noted to be bradycardic and hypotensive. He was pale, cool and clammy. Bp noted 71/37. Stat EKG done which showed a junctional rhythm at a rate of 30. He was placed on a zoll monitor, and cardiology was consulted. Dr. Sallyanne Kuster saw the patient in consultation.   Initial EKG showed what appeared to be a junctional rhythm, and was given 2 doses 2.5mg  of atropine, which increased his rate into the mid 40s, low 50s. EP was consulted related to symptomatic junctional rhythm, and possible pacemaker insertion. He denied any chest pain, but zoll monitoring was concerning for ST elevation. Repeat EKG was done and showed an acute inferior MI. The cath lab was called for emergent catheterization. Bp improved with IV fluids, and he remained in NSR in transport to the cath lab. Dr. Martinique performed [place] and PCI. On 07/16 developed dysuria and painful urination. Currently being treated for UTI. PT ongoing with recommendations to re-admit to acute inpatient rehab.   Total: 0=NIH  Past Medical History  Past Medical History  Diagnosis Date  . Chronic atrial fibrillation ([place])   . Severe aortic stenosis     s/p aortic valve replacement using a pericardial tissue valve  . HTN (hypertension)   . Lower extremity edema   . Hypercholesteremia     Family History  family history includes Diabetes in his mother; Heart attack in his father; Heart disease in his father.  Prior Rehab/Hospitalizations: Was admitted to CIR on 02/13/16 and tranferred back to acute hospital of 02/16/16 with an MI.  Has  the patient had major surgery during 100 days prior to admission? No  Current Medications   Current facility-administered medications:  .  0.9 % sodium chloride infusion, 250 mL, Intravenous, PRN, Peter M Martinique, MD . acetaminophen (TYLENOL) tablet 650 mg, 650 mg, Oral, Q4H PRN, Peter M Martinique, MD, 650 mg at 02/18/16 2258 . ampicillin (OMNIPEN) 1 g in sodium chloride 0.9 % 50 mL IVPB, 1 g, Intravenous, Q6H, Mihai Croitoru, MD, 1 g at 02/20/16 1102 . apixaban (ELIQUIS) tablet 2.5 mg, 2.5 mg, Oral, BID, Otilio Miu, RPH, 2.5 mg at 02/20/16 1102 . atorvastatin (LIPITOR) tablet 80 mg, 80 mg, Oral, q1800, Mihai Croitoru, MD, 80 mg at 02/19/16 1728 . clopidogrel (PLAVIX) tablet 75 mg, 75 mg, Oral, Q breakfast, Peter M Martinique, MD, 75 mg at 02/20/16 0759 . lisinopril (PRINIVIL,ZESTRIL) tablet 2.5 mg, 2.5 mg, Oral, Daily, Mihai Croitoru, MD, 2.5 mg at 02/20/16 1101 . ondansetron (ZOFRAN) injection 4 mg, 4 mg, Intravenous, Q6H PRN, Peter M Martinique, MD, 4 mg at 02/20/16 0802 . phenazopyridine (PYRIDIUM) tablet 100 mg, 100 mg, Oral, TID WC, Isaiah Serge, NP, 100 mg at 02/20/16 1102 . sodium chloride flush (NS) 0.9 % injection 3 mL, 3 mL, Intravenous, Q12H, Peter M Martinique, MD, 3 mL at 02/19/16 2243 . sodium chloride flush (NS) 0.9 % injection 3 mL, 3 mL, Intravenous, PRN, Peter M Martinique, MD  Patients Current Diet: Diet Heart Room service appropriate?: Yes; Fluid consistency:: Thin  Precautions / Restrictions Precautions Precautions: Fall Restrictions Weight Bearing Restrictions: No   Has the patient had 2 or more falls or a fall with injury in the past year?No  Prior Activity Level Limited Community (1-2x/wk): Goes out about 2 X a week  Development worker, international aid / Equipment Home Assistive Devices/Equipment: Eyeglasses  Prior Device Use: Indicate devices/aids used by the patient prior to current illness, exacerbation or injury? None  Prior Functional Level Prior Function Level  of Independence: Independent Comments: prior to 1st admission  Self Care: Did the patient need help bathing, dressing, using the toilet or eating? Independent  Indoor Mobility: Did the patient need assistance with walking from room to room (with or without device)? Independent  Stairs: Did the patient need assistance with internal or external stairs (with or without device)? Independent  Functional Cognition: Did the patient need help planning regular tasks such as shopping or remembering to take medications? Independent  Current Functional Level Cognition  Overall Cognitive Status: Impaired/Different from baseline (per long-time friend present) Current Attention Level: Sustained Orientation Level: Oriented X4 Safety/Judgement: Decreased awareness of safety, Decreased awareness of deficits General Comments: has multiple aspects of hospitalization confused (as he attempted to recount)   Extremity Assessment (includes Sensation/Coordination)  Upper Extremity Assessment: Overall WFL for tasks assessed  Lower Extremity Assessment: Generalized weakness, RLE deficits/detail, LLE deficits/detail RLE Deficits / Details: ankle DF to neutral only LLE Deficits / Details: ankle DF to neutral only    ADLs       Mobility  Overal bed mobility: Needs Assistance Bed Mobility: Supine to Sit, Sit to Supine Supine to sit: Supervision Sit to supine: Modified independent (Device/Increase time) General bed mobility comments: refused chair due to painful tailbone; supervission for safety    Transfers  Overall transfer level: Needs assistance Equipment used: None Transfers: Sit to/from Stand Sit to Stand: Min guard General transfer comment: pt used bed rail to steady himself as coming to tstand    Ambulation / Gait / Stairs / Emergency planning/management officer  Ambulation/Gait Ambulation/Gait assistance: Museum/gallery curator (Feet): 100 Feet Assistive device: 1 person hand held  assist Gait Pattern/deviations: Step-through pattern, Decreased stride length, Decreased  weight shift to right, Drifts right/left, Narrow base of support General Gait Details: nearly constant drift/lean to his left; able to incr velocity significantly however chooses slower velocity Gait velocity: decreased    Posture / Balance Static Standing Balance Tandem Stance - Left Leg: 0 (min assist to obain position and hold 10 sec) [place] - Eyes Opened: 0 (min assist to obtain position; held x 15 sec) Balance Overall balance assessment: Needs assistance, History of Falls (fell in bathroom while hospitalized) Sitting-balance support: No upper extremity supported, Feet supported Sitting balance-Leahy Scale: Good Standing balance support: Single extremity supported Standing balance-Leahy Scale: Poor Standing balance comment: seeks UE support Tandem Stance - Left Leg: 0 (min assist to obain position and hold 10 sec) Rhomberg - Eyes Opened: 0 (min assist to obtain position; held x 15 sec) High level balance activites: Direction changes, Turns, Sudden stops, Head turns High Level Balance Comments: min assist with drift to his left    Special needs/care consideration BiPAP/CPAP No CPM No Continuous Drip IV NO Dialysis No  Life Vest No Oxygen No Special Bed No Trach Size No Wound Vac (area) No  Skin No  Bowel mgmt: Last BM 02/19/16 Bladder mgmt: Burning and painful urination, currently being treated for UTI Diabetic mgmt No    Previous Home Environment Living Arrangements: Alone Lives With: Alone Available Help at Discharge: Family, Friend(s), Available 24 hours/day Type of Home: House Home Layout: One level Home Access: Stairs to enter Entrance Stairs-Rails: Right Entrance Stairs-Number of Steps: 4 Bathroom Shower/Tub: Multimedia programmer: Handicapped height Home Care Services: No  Discharge Living Setting Plans for  Discharge Living Setting: Patient's home, Alone, House (Family to stay with him as needed.) Type of Home at Discharge: House Discharge Home Layout: One level Discharge Home Access: Stairs to enter Entrance Stairs-Number of Steps: 3-5 steps  Social/Family/Support Systems Patient Roles: Parent, Other (Comment) (Has a daughter, 2 sons, 2 granschildren, brothers.) Contact Information: Amilio Juniper - son - [phone] Anticipated Caregiver: Local son and family including teen age grandchildren live about 88 yds away. Dtr lives in [place] but can return to assist as needed. Local son and dtr-in-law are primary supports. Anticipated Caregiver's Contact Information: Angie Fava - daughter - [phone] Ability/Limitations of Caregiver: Family will make sure that someone stays with patient after discharge as needed. Caregiver Availability: 24/7 (If needed.) Discharge Plan Discussed with Primary Caregiver: Yes Is Caregiver In Agreement with Plan?: Yes Does Caregiver/Family have Issues with Lodging/Transportation while Pt is in Rehab?: No  Goals/Additional Needs Patient/Family Goal for Rehab: PT/OT mod I and supervision goals Expected length of stay: 7 to 10 days Cultural Considerations: Lutheran Dietary Needs: Heart diet, thin liquids Equipment Needs: TBD Pt/Family Agrees to Admission and willing to participate: Yes Program Orientation Provided & Reviewed with Pt/Caregiver Including Roles & Responsibilities: Yes  Decrease burden of Care through IP rehab admission: N/A  Possible need for SNF placement upon discharge: Not planned  Patient Condition: This patient's medical and functional status has changed since the consult dated: 02/13/16 in which the Rehabilitation Physician determined and documented that the patient's condition is appropriate for intensive rehabilitative care in an inpatient rehabilitation facility. Patient was admitted to acute inpatient rehab on 02/13/16 to 02/16/16.  See "History of Present Illness" (above) for medical update. Functional changes are: Currently requiring min assist to ambulate 100 feet +1 HHA. Patient's medical and functional status update has been discussed with the Rehabilitation physician and patient remains appropriate for inpatient rehabilitation. Will admit to inpatient  rehab today.  Preadmission Screen Completed By: [name], 02/20/2016 11:30 AM ______________________________________________________________________  Discussed status with Dr. Posey Pronto on 02/20/16 at 1131 and received telephone approval for admission today.  Admission Coordinator: [name], time1131/Date07/17/17          Cosigned by: [name], MD at 02/20/2016 11:36 AM  Revision History     Date/Time User Provider Type Action   02/20/2016 11:36 AM [name], MD Physician Cosign   02/20/2016 11:31 AM [name], RN Rehab Admission Coordinator Sign

## 2016-02-21 NOTE — Plan of Care (Signed)
Problem: RH Stairs Goal: LTG Patient will ambulate up and down stairs w/assist (PT) LTG: Patient will ambulate up and down # of stairs with assistance (PT)  5 steps with R ascending rail for home entry/exit

## 2016-02-21 NOTE — Progress Notes (Addendum)
[place] PHYSICAL MEDICINE & REHABILITATION     PROGRESS NOTE  Subjective/Complaints:  Pt seen sitting up in bed this AM.  He states he slept well for half of the night.  ROS: Denies CP, SOB, N/V/D.  Objective: Vital Signs: Blood pressure 129/70, pulse 89, temperature 98 F (36.7 C), temperature source Oral, resp. rate 18, height 5' 9.5" (1.765 m), weight 70.353 kg (155 lb 1.6 oz), SpO2 96 %. No results found.  Recent Labs  02/19/16 0315 02/21/16 0724  WBC 9.2 9.7  HGB 11.8* 12.1*  HCT 36.0* 36.1*  PLT 389 334    Recent Labs  02/18/16 1056 02/21/16 0724  NA 135 136  K 4.0 3.8  CL 107 106  GLUCOSE 151* 99  BUN 16 14  CREATININE 0.88 0.86  CALCIUM 8.8* 8.9   CBG (last 3)  No results for input(s): GLUCAP in the last 72 hours.  Wt Readings from Last 3 Encounters:  02/20/16 70.353 kg (155 lb 1.6 oz)  02/20/16 77.656 kg (171 lb 3.2 oz)  02/15/16 88.5 kg (195 lb 1.7 oz)    Physical Exam:  BP 129/70 mmHg  Pulse 89  Temp(Src) 98 F (36.7 C) (Oral)  Resp 18  Ht 5' 9.5" (1.765 m)  Wt 70.353 kg (155 lb 1.6 oz)  BMI 22.58 kg/m2  SpO2 96% Constitutional: He appears well-developed and well-nourished. No distress.  HENT: Normocephalic and atraumatic.  Eyes: Conjunctivae and EOM are normal.   Cardiovascular: Normal rate. Occasional extrasystoles are present.  Respiratory: Effort normal and breath sounds normal. No stridor.  GI: Soft. Bowel sounds are normal. He exhibits no distension. There is no tenderness.  Musculoskeletal: He exhibits no edema or tenderness.  Neurological: He is alert and oriented.  Fatigued appearing with slow speech.  Able to follow basic motor commands.  Motor: B/l UE: 4+5 proximal to distal B/l LE: 4/5 proximal to distal  Skin: Skin is warm and dry.  Psychiatric: His affect is blunt. He is slowed.   Assessment/Plan: 1. Functional deficits secondary to to [facility], ACA aneurysm s/p coiling and STEMI which require 3+ hours per day of  interdisciplinary therapy in a comprehensive inpatient rehab setting. Physiatrist is providing close team supervision and 24 hour management of active medical problems listed below. Physiatrist and rehab team continue to assess barriers to discharge/monitor patient progress toward functional and medical goals.  Function:  Bathing Bathing position      Bathing parts      Bathing assist        Upper Body Dressing/Undressing Upper body dressing                    Upper body assist        Lower Body Dressing/Undressing Lower body dressing                                  Lower body assist        Toileting Toileting          Toileting assist     Transfers Chair/bed transfer             Locomotion Ambulation           Wheelchair          Cognition Comprehension Comprehension assist level: Follows basic conversation/direction with extra time/assistive device  Expression Expression assist level: Expresses complex ideas: With extra time/assistive device  Social Interaction Social  Interaction assist level: Interacts appropriately 90% of the time - Needs monitoring or encouragement for participation or interaction.  Problem Solving Problem solving assist level: Solves basic problems with no assist  Memory Memory assist level: Recognizes or recalls 75 - 89% of the time/requires cueing 10 - 24% of the time     Medical Problem List and Plan: 1. Unsteady gait and LOB with activity secondary to [facility] [place] [facility], ACA aneurysm s/p coiling and STEMI.  Begin CIR 2. DVT Prophylaxis/Anticoagulation: Pharmaceutical: Other (comment)--Eliquis 3. Pain Management: Tylenol prn for occasional HA.  4. Mood: LCSW to follow for evaluation and support.  5. Neuropsych: This patient is not fully capable of making decisions on his own behalf.  Neuropsych consult ordered 6. Skin/Wound Care:Routine pressure relief measures. Maintain adequate nutrition and hydration status.   7. Fluids/Electrolytes/Nutrition: Monitor I/O.   BMP WNL on 7/18 8. STEMI s/p DES LCx: On plavix and high dose lipitor. NO ASA due to recent [facility]. 9. HTN: Monitor BP tid. Continue to Lisinopril and titrate as indicated. 10. Enterococcus UTI: Antibiotic D # 4/5. Leucocytosis resolved and enterococcus is pansensitive therefore will change to penicillin. Continues to report dysuria/frequency improving--will check PVRs to rule out retention and add urispas.  11. Hyponatremia: Resolved.  12. A. Fib: Cont meds, monitor for signs/symptoms of bleeding 13. ABLA:   Hb 12.1 on 7/18  Cont to monitor  LOS (Days) 1 A FACE TO FACE EVALUATION WAS PERFORMED  Jason Hawkins 02/21/2016 9:16 AM

## 2016-02-21 NOTE — Plan of Care (Signed)
Problem: RH Bed Mobility Goal: LTG Patient will perform bed mobility with assist (PT) LTG: Patient will perform bed mobility with assistance, with/without cues (PT). Regular bed  Problem: RH Bed to Chair Transfers Goal: LTG Patient will perform bed/chair transfers w/assist (PT) LTG: Patient will perform bed/chair transfers with assistance, with/without cues (PT). With LRAD  Problem: RH Car Transfers Goal: LTG Patient will perform car transfers with assist (PT) LTG: Patient will perform car transfers with assistance (PT). With LRAD  Problem: RH Furniture Transfers Goal: LTG Patient will perform furniture transfers w/assist (OT/PT LTG: Patient will perform furniture transfers with assistance (OT/PT). With LRAD  Problem: RH Ambulation Goal: LTG Patient will ambulate in controlled environment (PT) LTG: Patient will ambulate in a controlled environment, # of feet with assistance (PT). 150 ft with LRAD Goal: LTG Patient will ambulate in home environment (PT) LTG: Patient will ambulate in home environment, # of feet with assistance (PT). 11 ft with LRAD Goal: LTG Patient will ambulate in community environment (PT) LTG: Patient will ambulate in community environment, # of feet with assistance (PT). 150 ft with LRAD  Problem: RH Wheelchair Mobility Goal: LTG Patient will propel w/c in controlled environment (PT) LTG: Patient will propel wheelchair in controlled environment, # of feet with assist (PT) 150 ft for cardiovascular endurance training  Problem: RH Stairs Goal: LTG Patient will ambulate up and down stairs w/assist (PT) LTG: Patient will ambulate up and down # of stairs with assistance (PT) 5 steps with 1 rail for home entry/exit

## 2016-02-21 NOTE — IPOC Note (Signed)
Overall Plan of Care [facility]) Patient Details Name: Jason Hawkins MRN: [id number] DOB: [birth year]  Admitting Diagnosis: Aneurysm Coiling  Hospital Problems: Active Problems:   SAH (subarachnoid hemorrhage) ([place])   Flat affect     Functional Problem List: Nursing Edema, Endurance, Medication Management, Nutrition, Pain, Safety, Skin Integrity, Bladder, Bowel  PT Balance, Behavior, Endurance, Pain, Safety, Sensory  OT Balance, Endurance, Motor, Skin Integrity, Cognition, Safety  SLP Cognition, Linguistic  TR         Basic ADL's: OT Grooming, Bathing, Dressing, Toileting     Advanced  ADL's: OT Simple Meal Preparation, Light Housekeeping     Transfers: PT Bed Mobility, Bed to Chair, Car, Furniture, Futures trader, Metallurgist: PT Ambulation, Stairs, Wheelchair Mobility     Additional Impairments: OT None  SLP Communication, Social Cognition expression Problem Solving, Memory, Attention, Awareness  TR      Anticipated Outcomes Item Anticipated Outcome  Self Feeding I  Swallowing      Basic self-care  mod I  Toileting  mod I   Bathroom Transfers mod I to toilet, supervision to shower  Bowel/Bladder  mod I  Transfers  Mod I  Locomotion  Mod I with LRAD  Communication  Mod I   Cognition  Mod I   Pain  3 or less  Safety/Judgment  mod i   Therapy Plan: PT Intensity: Minimum of 1-2 x/day ,45 to 90 minutes PT Frequency: 5 out of 7 days PT Duration Estimated Length of Stay: 7-10 days OT Intensity: Minimum of 1-2 x/day, 45 to 90 minutes OT Frequency: 5 out of 7 days OT Duration/Estimated Length of Stay: 7-9 days SLP Intensity: Minumum of 1-2 x/day, 30 to 90 minutes SLP Frequency: 3 to 5 out of 7 days SLP Duration/Estimated Length of Stay: 7-10 days        Team Interventions: Nursing Interventions Patient/Family Education, Disease Management/Prevention, Pain Management, Medication Management, Skin Care/Wound Management, Discharge  Planning  PT interventions Ambulation/gait training, Discharge planning, Functional mobility training, Psychosocial support, Therapeutic Activities, Visual/perceptual remediation/compensation, Wheelchair propulsion/positioning, Therapeutic Exercise, Neuromuscular re-education, Disease management/prevention, Training and development officer, Cognitive remediation/compensation, DME/adaptive equipment instruction, Pain management, UE/LE Strength taining/ROM, UE/LE Coordination activities, Stair training, Patient/family education, Community reintegration  OT Interventions Training and development officer, Cognitive remediation/compensation, Academic librarian, Discharge planning, Engineer, drilling, Disease mangement/prevention, Functional mobility training, Neuromuscular re-education, Patient/family education, Pain management, Self Care/advanced ADL retraining, Therapeutic Activities, Therapeutic Exercise, UE/LE Strength taining/ROM, UE/LE Coordination activities, Visual/perceptual remediation/compensation, Psychosocial support  SLP Interventions Cognitive remediation/compensation, Cueing hierarchy, Environmental controls, Functional tasks, Medication managment, Patient/family education, Speech/Language facilitation  TR Interventions    SW/CM Interventions      Team Discharge Planning: Destination: PT-Home ,OT- Home , SLP-Home Projected Follow-up: PT-Home health PT (intermittent supervision), OT-  Home health OT, SLP-Other (comment), Outpatient SLP (Supervision with complex tasks; intermittent supervision overall ) Projected Equipment Needs: PT-3 in 1 bedside comode, To be determined, OT- To be determined, SLP-None recommended by SLP Equipment Details: PT- , OT-  Patient/family involved in discharge planning: PT- Patient,  OT-Patient, SLP-Patient  MD ELOS: 7-10 days. Medical Rehab Prognosis:  Good Assessment: 82 y.o. male with history if AVR, A fib-on coumadin, HTN who was admitted on  01/31/16 with severe frontal headache. He was found to have [place] and cerebral angiogram revealed 1.5 X 3 mm ACA aneurysm and INR 1.8. Coumadin reversed and BP elevated therefore he was placed on Cardene drip for BP control. He underwent coil embolization of ACA aneurysm on 02/01/16  by Dr. Kathyrn Sheriff. Post op reported to have CP due to fluid overload and improved with IV diuresis. He was admitted to CIR on 02/13/16 and topamax was added due to reports of severe HA. Lethargy was resolving but he continued to have moderate cognitive deficits and required min assist with ADL and mobility. He developed nauseas with MS changed and was found to be hypotensive with junctional rhythm and repeat EKG showed acute inferior STEMI.  He was taken to cath lab for work up and underwent successful stenting of distal Left Cx with DES. Transient 3rd degree AVB due to inferior MI has resolved and BP stable. Eliquis resumed and Plavix added X 12 months due to stent. No ASA due to recent SAH and no BB due to slow ventricular rate in AF. Echo with normal function of bioprosthetic valve. Blood pressures trending up and low dose lisinopril added for tighter control. He has had complaints of frequency and was started on IV ampicillin for enterococcus UTI. Therapy resumed and patient continues to have narrow BOS, drift to right/left and decreased in activity tolerance. He was admitted back to CIR to complete his rehab course.  Will set goals for Mod I with therapies.  See Team Conference Notes for weekly updates to the plan of care

## 2016-02-22 ENCOUNTER — Inpatient Hospital Stay (HOSPITAL_COMMUNITY): Payer: Medicare Other

## 2016-02-22 ENCOUNTER — Inpatient Hospital Stay (HOSPITAL_COMMUNITY): Payer: Medicare Other | Admitting: Speech Pathology

## 2016-02-22 ENCOUNTER — Inpatient Hospital Stay (HOSPITAL_COMMUNITY): Payer: Medicare Other | Admitting: Physical Therapy

## 2016-02-22 NOTE — Progress Notes (Signed)
Recreational Therapy Session Note  Patient Details  Name: Jason Hawkins MRN: [id number] Date of Birth: 08/28/1927 Today's Date: 02/22/2016  Eval deferred due to ELOS. [place] 02/22/2016, 11:21 AM

## 2016-02-22 NOTE — Progress Notes (Signed)
Occupational Therapy Session Note  Patient Details  Name: Jason Hawkins MRN: [id number] Date of Birth: 08-Sep-1927  Today's Date: 02/22/2016 OT Individual Time: 0945-1100 OT Individual Time Calculation (min): 75 min    Short Term Goals: Week 1:   STG=LTG secondary to ELOS  Skilled Therapeutic Interventions/Progress Updates:    Pt resting in bed upon arrival.  Pt stated he had shower the previous day and only needed to use the toilet, shave, and don clothing.  Pt agreeable to shower tomorrow.  Pt amb with RW to bathroom and completed all toileting tasks at supervision level.  Pt completed grooming tasks as well as standing to perform periarea hygiene prior to donning clothing (supervision level). Pt propelled w/c to ADL apartment and then amb with RW to therapy gym.  Pt engaged in dynamic standing tasks, bed mobility, and functional amb with RW for home mgmt tasks.  Pt amb with RW back to his room and returned to bed to rest before his next therapy.  Pt remained in bed with all needs within reach and bed alarm activated.  Focus on activity tolerance, functional amb with RW, standing balance, discharge planning, and safety awareness to increase independence with BADLs.   Therapy Documentation Precautions:  Precautions Precautions: Fall Restrictions Weight Bearing Restrictions: No   Pain: Pain Assessment Pain Assessment: No/denies pain Pain Score: 0-No pain ADL: ADL ADL Comments: refer to functional navigator  See Function Navigator for Current Functional Status.   Therapy/Group: Individual Therapy  Leroy Libman 02/22/2016, 11:07 AM

## 2016-02-22 NOTE — Progress Notes (Signed)
[place] PHYSICAL MEDICINE & REHABILITATION     PROGRESS NOTE  Subjective/Complaints:  Pt states he had a good first day of therapies and slept well overnight as a result.    ROS: Denies CP, SOB, N/V/D.  Objective: Vital Signs: Blood pressure 135/70, pulse 63, temperature 98.1 F (36.7 C), temperature source Oral, resp. rate 17, height 5' 9.5" (1.765 m), weight 84.777 kg (186 lb 14.4 oz), SpO2 99 %. No results found.  Recent Labs  02/21/16 0724  WBC 9.7  HGB 12.1*  HCT 36.1*  PLT 334    Recent Labs  02/21/16 0724  NA 136  K 3.8  CL 106  GLUCOSE 99  BUN 14  CREATININE 0.86  CALCIUM 8.9   CBG (last 3)  No results for input(s): GLUCAP in the last 72 hours.  Wt Readings from Last 3 Encounters:  02/22/16 84.777 kg (186 lb 14.4 oz)  02/20/16 77.656 kg (171 lb 3.2 oz)  02/15/16 88.5 kg (195 lb 1.7 oz)    Physical Exam:  BP 135/70 mmHg  Pulse 63  Temp(Src) 98.1 F (36.7 C) (Oral)  Resp 17  Ht 5' 9.5" (1.765 m)  Wt 84.777 kg (186 lb 14.4 oz)  BMI 27.21 kg/m2  SpO2 99% Constitutional: He appears well-developed and well-nourished. No distress.  HENT: Normocephalic and atraumatic.  Eyes: Conjunctivae and EOM are normal.   Cardiovascular: Normal rate. Occasional extrasystoles are present.  Respiratory: Effort normal and breath sounds normal. No stridor.  GI: Soft. Bowel sounds are normal. He exhibits no distension. There is no tenderness.  Musculoskeletal: He exhibits no edema or tenderness.  Neurological: He is alert and oriented.  Fatigued appearing with slow speech.  Able to follow basic motor commands.  Motor: B/l UE: 4+5 proximal to distal B/l LE: 4/5 proximal to distal  Skin: Skin is warm and dry.  Psychiatric: His affect is blunt. He is slowed.   Assessment/Plan: 1. Functional deficits secondary to to [facility], ACA aneurysm s/p coiling and STEMI which require 3+ hours per day of interdisciplinary therapy in a comprehensive inpatient rehab  setting. Physiatrist is providing close team supervision and 24 hour management of active medical problems listed below. Physiatrist and rehab team continue to assess barriers to discharge/monitor patient progress toward functional and medical goals.  Function:  Bathing Bathing position   Position: Shower  Bathing parts Body parts bathed by patient: Right arm, Left arm, Chest, Abdomen, Front perineal area, Buttocks, Right upper leg, Right lower leg, Left upper leg, Left lower leg Body parts bathed by helper: Back  Bathing assist Assist Level: Supervision or verbal cues      Upper Body Dressing/Undressing Upper body dressing                    Upper body assist        Lower Body Dressing/Undressing Lower body dressing   What is the patient wearing?: Pants, Non-skid slipper socks     Pants- Performed by patient: Thread/unthread right pants leg, Thread/unthread left pants leg, Pull pants up/down, Fasten/unfasten pants     Non-skid slipper socks- Performed by helper: Don/doff right sock, Don/doff left sock                  Lower body assist        Toileting Toileting   Toileting steps completed by patient: Adjust clothing prior to toileting, Performs perineal hygiene, Adjust clothing after toileting      Toileting assist Assist level: Supervision or  verbal cues   Transfers Chair/bed transfer   Chair/bed transfer method: Stand pivot Chair/bed transfer assist level: Touching or steadying assistance (Pt > 75%) Chair/bed transfer assistive device: Bedrails, Armrests     Locomotion Ambulation     Max distance: 250 ft Assist level: Touching or steadying assistance (Pt > 75%)   Wheelchair   Type: Manual Max wheelchair distance: 250 ft Assist Level: Supervision or verbal cues  Cognition Comprehension Comprehension assist level: Follows basic conversation/direction with extra time/assistive device  Expression Expression assist level: Expresses basic  needs/ideas: With extra time/assistive device  Social Interaction Social Interaction assist level: Interacts appropriately 75 - 89% of the time - Needs redirection for appropriate language or to initiate interaction.  Problem Solving Problem solving assist level: Solves basic 90% of the time/requires cueing < 10% of the time  Memory Memory assist level: Recognizes or recalls 75 - 89% of the time/requires cueing 10 - 24% of the time     Medical Problem List and Plan: 1. Unsteady gait and LOB with activity secondary to [facility] ACA aneurysm s/p coiling and STEMI.  Cont CIR 2. DVT Prophylaxis/Anticoagulation: Pharmaceutical: Other (comment)--Eliquis 3. Pain Management: Tylenol prn for occasional HA.  4. Mood: LCSW to follow for evaluation and support.  5. Neuropsych: This patient is not fully capable of making decisions on his own behalf.  Neuropsych consult pending 6. Skin/Wound Care:Routine pressure relief measures. Maintain adequate nutrition and hydration status.  7. Fluids/Electrolytes/Nutrition: Monitor I/O.   BMP WNL on 7/18 8. STEMI s/p DES LCx: On plavix and high dose lipitor. NO ASA due to recent [facility]. 9. HTN: Monitor BP tid. Continue to Lisinopril and titrate as indicated. 10. Enterococcus UTI: Antibiotic D # 5/5. Leucocytosis resolved and enterococcus is pansensitive therefore changed to penicillin.   PVRs WNL  Added urispas.  11. Hyponatremia: Resolved.  12. A. Fib: Cont meds, monitor for signs/symptoms of bleeding 13. ABLA:   Hb 12.1 on 7/18  Cont to monitor  LOS (Days) 2 A FACE TO FACE EVALUATION WAS PERFORMED  [name] 02/22/2016 9:11 AM

## 2016-02-22 NOTE — Progress Notes (Signed)
Speech Language Pathology Discharge Summary and Final Treatment Note  Patient Details  Name: Jason Hawkins MRN: [id number] Date of Birth: 1927/11/10  Today's Date: 02/22/2016 SLP Individual Time: 1304-1400 SLP Individual Time Calculation (min): 56 min   Skilled Therapeutic Interventions:   Skilled treatment session focused on addressing continued diagnostic treatment of cognitive goals. SLP facilitated session by providing a quiet environment and increased time to complete financial and medication management tasks.  SLP introduced distractions toward end of session which did not impact performance. SLP discussed yesterday's session with patient who was in agreement that his fatigue was impacting performance.  Given that patient is Mod I today no further skilled SLP services are warranted at this time.  Patient in agreement with plan for SLP discharge and was educated briefly on basic home management recommendations.  Continue with current plan of care.     Patient has met 4 of 4 long term goals.  Patient to discharge at overall Modified Independent level.  Reasons goals not met: n/a   Clinical Impression/Discharge Summary:    Patient has made functional gains during this rehab admission and has met 4 out of 4 long term goals due to improved functional abilities.  Patient is currently overall Mod I for cognitive tasks assessed, patient and family education has been completed, and patient will discharge home with initial 24 hour supervision from family.  Recommend implementation of memory compensatory strategies upon discharge home.  No further skilled SLP services are warranted at this time.     Care Partner:  Caregiver Able to Provide Assistance: Other (comment) (intermittent supervision short term )  Type of Caregiver Assistance: Cognitive;Physical  Recommendation:  None     Equipment: none   Reasons for discharge: Treatment goals met   Patient/Family Agrees with Progress Made and  Goals Achieved: Yes   Function:  Cognition Comprehension Comprehension assist level: Follows complex conversation/direction with extra time/assistive device  Expression   Expression assist level: Expresses complex ideas: With extra time/assistive device  Social Interaction Social Interaction assist level: Interacts appropriately with others with medication or extra time (anti-anxiety, antidepressant).  Problem Solving Problem solving assist level: Solves complex problems: With extra time  Memory Memory assist level: Assistive device: No helper   Carmelia Roller., CCC-SLP [phone]  [place] 02/22/2016, 5:18 PM

## 2016-02-22 NOTE — Progress Notes (Signed)
Physical Therapy Session Note  Patient Details  Name: Jason Hawkins MRN: [id number] Date of Birth: 10-21-1927  Today's Date: 02/22/2016 PT Individual Time: 1100-1200 PT Individual Time Calculation (min): 60 min   Short Term Goals: Week 1:  PT Short Term Goal 1 (Week 1): STG = LTG due to short ELOS.  Skilled Therapeutic Interventions/Progress Updates:    Patient received supine in bed and agreeable to PT.   Patient instructed in gait training for 193ft x 2 with supervision A from PT. PT provided min cues for improved step length and step height to prevent shoe drag and improve safety of gait training.   PT instructed patient in BERG balance test; see below.   PT instructed patient in DGI; see below for results.   Patient returned to room and left sitting EOB with bed alarm set and call bell within reach.    Therapy Documentation Precautions:  Precautions Precautions: Fall Restrictions Weight Bearing Restrictions: No Pain: Pain Assessment Pain Assessment: No/denies pain     Balance: Balance Balance Assessed: Yes Standardized Balance Assessment Standardized Balance Assessment: Berg Balance Test Berg Balance Test Sit to Stand: Able to stand  independently using hands Standing Unsupported: Able to stand safely 2 minutes Sitting with Back Unsupported but Feet Supported on Floor or Stool: Able to sit safely and securely 2 minutes Stand to Sit: Controls descent by using hands Transfers: Able to transfer safely, definite need of hands Standing Unsupported with Eyes Closed: Able to stand 10 seconds safely Standing Ubsupported with Feet Together: Able to place feet together independently and stand for 1 minute with supervision From Standing, Reach Forward with Outstretched Arm: Can reach confidently >25 cm (10") From Standing Position, Pick up Object from Floor: Able to pick up shoe, needs supervision From Standing Position, Turn to Look Behind Over each Shoulder: Looks behind  one side only/other side shows less weight shift Turn 360 Degrees: Able to turn 360 degrees safely but slowly Standing Unsupported, Alternately Place Feet on Step/Stool: Able to complete >2 steps/needs minimal assist Standing Unsupported, One Foot in Front: Able to take small step independently and hold 30 seconds Standing on One Leg: Able to lift leg independently and hold equal to or more than 3 seconds Total Score: 41  Patient demonstrates increased fall risk as noted by score of   41/56 on Berg Balance Scale.  (<36= high risk for falls, close to 100%; 37-45 significant >80%; 46-51 moderate >50%; 52-55 lower >25%)   Dynamic Gait Index Level Surface: Mild Impairment Change in Gait Speed: Mild Impairment Gait with Horizontal Head Turns: Mild Impairment Gait with Vertical Head Turns: Moderate Impairment Gait and Pivot Turn: Moderate Impairment Step Over Obstacle: Mild Impairment Step Around Obstacles: Mild Impairment Steps: Mild Impairment Total Score: 14  <19 indicated increased fall risk in older adults.     See Function Navigator for Current Functional Status.   Therapy/Group: Individual Therapy  Lorie Phenix 02/22/2016, 5:57 PM

## 2016-02-23 ENCOUNTER — Inpatient Hospital Stay (HOSPITAL_COMMUNITY): Payer: Medicare Other | Admitting: Physical Therapy

## 2016-02-23 ENCOUNTER — Inpatient Hospital Stay (HOSPITAL_COMMUNITY): Payer: Medicare Other

## 2016-02-23 NOTE — Progress Notes (Signed)
Occupational Therapy Session Note  Patient Details  Name: Jason Hawkins MRN: [id number] Date of Birth: 05-11-1928  Today's Date: 02/23/2016 OT Individual Time: 0945-1100 OT Individual Time Calculation (min): 75 min    Short Term Goals: Week 1:   STG=LTG secondary to ELOS  Skilled Therapeutic Interventions/Progress Updates:    Pt asleep in bed upon arrival but easily aroused.  Pt c/o slight nausea but agreeable to engaging in BADL retraining including bathing at shower level and dressing with sit<>stand from EOB.  Pt amb with RW into bathroom and used toilet prior to transferring to shower to complete bathing tasks.  Pt completed all tasks at supervision level requiring multiple rest breaks.  Pt requires more than a reasonable amount of time to complete all tasks.  Focus on activity tolerance, sit<>stand, standing balance, functional transfers, energy conservation, safety awareness, and discharge planning.   Therapy Documentation Precautions:  Precautions Precautions: Fall Restrictions Weight Bearing Restrictions: No Pain:  Pt denied pain but c/o ongoing nausea ADL: ADL ADL Comments: refer to functional navigator  See Function Navigator for Current Functional Status.   Therapy/Group: Individual Therapy  Leroy Libman 02/23/2016, 11:02 AM

## 2016-02-23 NOTE — Progress Notes (Addendum)
Physical Therapy Session Note  Patient Details  Name: Jason Hawkins MRN: [id number] Date of Birth: [birth year]  Today's Date: 02/23/2016 PT Individual Time: 0802-0916 1501-1540 PT Individual Time Calculation (min): 74 min AND 39 min   Short Term Goals: Week 1:  PT Short Term Goal 1 (Week 1): STG = LTG due to short ELOS.  Skilled Therapeutic Interventions/Progress Updates:    Patient received sitting EOB with son present in room. Patient requested to use restroom and BM. Son educated in safe gaurding technique for ambulatory transfer to bathroom with supervision A. Min cues to son for safe positioning  Posterior/lateral to patient. Toilet transfer completes with use of rails in bathroom with supervision A. Patient completed perineal hygiene without assistance from PT.   PT instructed patient in gait training in simulated community environments including food court for 342f with supervision A and on cement/paved sidewalk for 2048fwith suJasperPT provided min cues for AD management on change in flooring surface and cracks in sidewalk.   Stair training on 12 steps (6inch) with 1 UE support on R and supervision A. Patient utilized self selected step-over-step gait pattern on steps with mild instability with descent with R LE. PT encouraged patient to utilize step-to pattern with descent, but patient desired to continue to perform step-over-step.   PT instructed [place] in [place] home exercise program.  LAQ x10 BLE, standing HS curl x 10 BLE, Standing hip abduction x 10 BLE, mini squats x10 BLE, Tandem stance 2x10 seconds with 1 UE support, and sit<>stand x 5 with BUE support.  PT provided supervision A for all therex with mod cues for improved technique including increased ROM, decreased compensation of trunk, decreased speed with eccentric movement and proper UE support.   WC mobility in hall back to room for 15021fithout cues or assistance, but noted to require increased time and  effort, especially with increased distance.   Patient left supine in bed with all needs met at end of session.   Session 2:  Patient received supine in bed and agreeable to PT. Patient performed supine>sit transfer with increased time and effort, but no cues from PT.   PT instructed patient in gait training with RW without cues or assistance from PT for [id number] noted to have inconsistent step length from L LE to R LE due to leg length discrepancy.   Dynamic gait training through obstacle course with RW including stepping over 2inch, 4inch obstacles and asdending/descending 6 inch curb, as well as weave through 8 cones. PT provided supervision A and mod cues for AD management with stepping over obstacles and up onto curb.   Gait with SPC for 100ft. 28f supervision A and min cues for SPC sequencing and positioning to improve stability of gait with AD.  Gait without RW for 100ftx2 60f lateral head turns and head nods. Patient noted to continue to demonstrate decreased gait speed with change in head position from neutral and required min A to prevent LOB x 2.   Car transfer training x 2 with RW for SUV and sedan height with supervision A from PT with min cues for sit then pivot technique for improved safety.    Patient left supine in bed with call bell in reach following sit>supine transfer without assist from PT.       Therapy Documentation Precautions:  Precautions Precautions: Fall Restrictions Weight Bearing Restrictions: No General:   Vital Signs: Therapy Vitals BP: (!) 150/79 mmHg  See Function  Navigator for Current Functional Status.   Therapy/Group: Individual Therapy  Lorie Phenix 02/23/2016, 10:32 AM

## 2016-02-23 NOTE — Progress Notes (Signed)
[facility] PHYSICAL MEDICINE & REHABILITATION     PROGRESS NOTE  Subjective/Complaints:  Pt seen sitting up in bed with his son at bedside.  He slept fairly overnight.  He has questions about his discharge date.    ROS: Denies CP, SOB, N/V/D.  Objective: Vital Signs: Blood pressure 150/79, pulse 63, temperature 97.5 F (36.4 C), temperature source Oral, resp. rate 18, height 5' 9.5" (1.765 m), weight 77.656 kg (171 lb 3.2 oz), SpO2 100 %. No results found.  Recent Labs  02/21/16 0724  WBC 9.7  HGB 12.1*  HCT 36.1*  PLT 334    Recent Labs  02/21/16 0724  NA 136  K 3.8  CL 106  GLUCOSE 99  BUN 14  CREATININE 0.86  CALCIUM 8.9   CBG (last 3)  No results for input(s): GLUCAP in the last 72 hours.  Wt Readings from Last 3 Encounters:  02/22/16 77.656 kg (171 lb 3.2 oz)  02/20/16 77.656 kg (171 lb 3.2 oz)  02/15/16 88.5 kg (195 lb 1.7 oz)    Physical Exam:  BP 150/79 mmHg  Pulse 63  Temp(Src) 97.5 F (36.4 C) (Oral)  Resp 18  Ht 5' 9.5" (1.765 m)  Wt 77.656 kg (171 lb 3.2 oz)  BMI 24.93 kg/m2  SpO2 100% Constitutional: He appears well-developed and well-nourished. No distress.  HENT: Normocephalic and atraumatic.  Eyes: Conjunctivae and EOM are normal.   Cardiovascular: Normal rate. Occasional extrasystoles are present.  Respiratory: Effort normal and breath sounds normal. No stridor.  GI: Soft. Bowel sounds are normal. He exhibits no distension. There is no tenderness.  Musculoskeletal: He exhibits no edema or tenderness.  Neurological: He is alert and oriented.  Fatigued appearing with slow speech.  Able to follow basic motor commands.  Motor: B/l UE: 4+5 proximal to distal B/l LE: 4/5 proximal to distal  Skin: Skin is warm and dry.  Psychiatric: His affect is blunt. He is slowed.   Assessment/Plan: 1. Functional deficits secondary to to [employer], ACA aneurysm s/p coiling and STEMI which require 3+ hours per day of interdisciplinary therapy in a  comprehensive inpatient rehab setting. Physiatrist is providing close team supervision and 24 hour management of active medical problems listed below. Physiatrist and rehab team continue to assess barriers to discharge/monitor patient progress toward functional and medical goals.  Function:  Bathing Bathing position   Position: Shower  Bathing parts Body parts bathed by patient: Right arm, Left arm, Chest, Abdomen, Front perineal area, Buttocks, Right upper leg, Right lower leg, Left upper leg, Left lower leg Body parts bathed by helper: Back  Bathing assist Assist Level: Supervision or verbal cues      Upper Body Dressing/Undressing Upper body dressing                    Upper body assist        Lower Body Dressing/Undressing Lower body dressing   What is the patient wearing?: Pants, Non-skid slipper socks     Pants- Performed by patient: Thread/unthread right pants leg, Thread/unthread left pants leg, Pull pants up/down, Fasten/unfasten pants     Non-skid slipper socks- Performed by helper: Don/doff right sock, Don/doff left sock                  Lower body assist        Toileting Toileting   Toileting steps completed by patient: Adjust clothing prior to toileting, Performs perineal hygiene, Adjust clothing after toileting  Toileting assist Assist level: Supervision or verbal cues   Transfers Chair/bed transfer   Chair/bed transfer method: Ambulatory Chair/bed transfer assist level: Supervision or verbal cues Chair/bed transfer assistive device: Walker, Air cabin crew     Max distance: 114ft Assist level: Supervision or verbal cues   Wheelchair   Type: Manual Max wheelchair distance: 250 ft Assist Level: Supervision or verbal cues  Cognition Comprehension Comprehension assist level: Follows basic conversation/direction with extra time/assistive device  Expression Expression assist level: Expresses basic needs/ideas:  With extra time/assistive device  Social Interaction Social Interaction assist level: Interacts appropriately with others with medication or extra time (anti-anxiety, antidepressant).  Problem Solving Problem solving assist level: Solves basic problems with no assist  Memory Memory assist level: Recognizes or recalls 90% of the time/requires cueing < 10% of the time     Medical Problem List and Plan: 1. Unsteady gait and LOB with activity secondary to [facility], ACA aneurysm s/p coiling and STEMI.  Cont CIR  Pt would like to move up discharge date. 2. DVT Prophylaxis/Anticoagulation: Pharmaceutical: Other (comment)--Eliquis 3. Pain Management: Tylenol prn for occasional HA.  4. Mood: LCSW to follow for evaluation and support.  5. Neuropsych: This patient is not fully capable of making decisions on his own behalf.  Neuropsych consult pending 6. Skin/Wound Care:Routine pressure relief measures. Maintain adequate nutrition and hydration status.  7. Fluids/Electrolytes/Nutrition: Monitor I/O.   BMP WNL on 7/18 8. STEMI s/p DES LCx: On plavix and high dose lipitor. NO ASA due to recent [facility]. 9. HTN: Monitor BP tid. Continue to Lisinopril and titrate as indicated. 10. Enterococcus UTI: Antibiotic completed.   PVRs WNL  Added urispas.  11. Hyponatremia: Resolved.  12. A. Fib: Cont meds, monitor for signs/symptoms of bleeding 13. ABLA:   Hb 12.1 on 7/18  Cont to monitor  LOS (Days) 3 A FACE TO FACE EVALUATION WAS PERFORMED  [name] 02/23/2016 9:17 AM

## 2016-02-24 ENCOUNTER — Inpatient Hospital Stay (HOSPITAL_COMMUNITY): Payer: Medicare Other

## 2016-02-24 ENCOUNTER — Inpatient Hospital Stay (HOSPITAL_COMMUNITY): Payer: Medicare Other | Admitting: Physical Therapy

## 2016-02-24 ENCOUNTER — Inpatient Hospital Stay (HOSPITAL_COMMUNITY)
Admission: AD | Admit: 2016-02-24 | Discharge: 2016-02-28 | DRG: 315 | Disposition: A | Discharge status: Home / Self Care | Payer: Medicare Other | Source: Ambulatory Visit | Attending: Cardiovascular Disease | Admitting: Cardiovascular Disease

## 2016-02-24 ENCOUNTER — Encounter (HOSPITAL_COMMUNITY): Payer: Self-pay | Admitting: Physician Assistant

## 2016-02-24 DIAGNOSIS — I959 Hypotension, unspecified: Secondary | ICD-10-CM

## 2016-02-24 DIAGNOSIS — N390 Urinary tract infection, site not specified: Secondary | ICD-10-CM | POA: Diagnosis not present

## 2016-02-24 DIAGNOSIS — R4589 Other symptoms and signs involving emotional state: Secondary | ICD-10-CM | POA: Diagnosis not present

## 2016-02-24 DIAGNOSIS — I252 Old myocardial infarction: Secondary | ICD-10-CM

## 2016-02-24 DIAGNOSIS — I482 Chronic atrial fibrillation, unspecified: Secondary | ICD-10-CM | POA: Diagnosis present

## 2016-02-24 DIAGNOSIS — I221 Subsequent ST elevation (STEMI) myocardial infarction of inferior wall: Secondary | ICD-10-CM

## 2016-02-24 DIAGNOSIS — R531 Weakness: Secondary | ICD-10-CM | POA: Diagnosis not present

## 2016-02-24 DIAGNOSIS — I609 Nontraumatic subarachnoid hemorrhage, unspecified: Secondary | ICD-10-CM

## 2016-02-24 DIAGNOSIS — Z952 Presence of prosthetic heart valve: Secondary | ICD-10-CM | POA: Diagnosis not present

## 2016-02-24 DIAGNOSIS — Z7902 Long term (current) use of antithrombotics/antiplatelets: Secondary | ICD-10-CM | POA: Diagnosis not present

## 2016-02-24 DIAGNOSIS — L8990 Pressure ulcer of unspecified site, unspecified stage: Secondary | ICD-10-CM | POA: Insufficient documentation

## 2016-02-24 DIAGNOSIS — E785 Hyperlipidemia, unspecified: Secondary | ICD-10-CM | POA: Diagnosis present

## 2016-02-24 DIAGNOSIS — I602 Nontraumatic subarachnoid hemorrhage from anterior communicating artery: Secondary | ICD-10-CM

## 2016-02-24 DIAGNOSIS — Z954 Presence of other heart-valve replacement: Secondary | ICD-10-CM

## 2016-02-24 DIAGNOSIS — I472 Ventricular tachycardia: Secondary | ICD-10-CM | POA: Diagnosis present

## 2016-02-24 DIAGNOSIS — R2681 Unsteadiness on feet: Secondary | ICD-10-CM | POA: Diagnosis not present

## 2016-02-24 DIAGNOSIS — E46 Unspecified protein-calorie malnutrition: Secondary | ICD-10-CM | POA: Diagnosis not present

## 2016-02-24 DIAGNOSIS — Z7901 Long term (current) use of anticoagulants: Secondary | ICD-10-CM

## 2016-02-24 DIAGNOSIS — I10 Essential (primary) hypertension: Secondary | ICD-10-CM | POA: Diagnosis present

## 2016-02-24 DIAGNOSIS — I2119 ST elevation (STEMI) myocardial infarction involving other coronary artery of inferior wall: Secondary | ICD-10-CM | POA: Diagnosis not present

## 2016-02-24 DIAGNOSIS — R001 Bradycardia, unspecified: Secondary | ICD-10-CM | POA: Diagnosis not present

## 2016-02-24 DIAGNOSIS — I951 Orthostatic hypotension: Secondary | ICD-10-CM | POA: Diagnosis not present

## 2016-02-24 DIAGNOSIS — I2510 Atherosclerotic heart disease of native coronary artery without angina pectoris: Secondary | ICD-10-CM | POA: Diagnosis present

## 2016-02-24 DIAGNOSIS — Z955 Presence of coronary angioplasty implant and graft: Secondary | ICD-10-CM

## 2016-02-24 DIAGNOSIS — I4891 Unspecified atrial fibrillation: Secondary | ICD-10-CM

## 2016-02-24 DIAGNOSIS — E871 Hypo-osmolality and hyponatremia: Secondary | ICD-10-CM | POA: Diagnosis not present

## 2016-02-24 DIAGNOSIS — Z91030 Bee allergy status: Secondary | ICD-10-CM | POA: Diagnosis not present

## 2016-02-24 DIAGNOSIS — Z87891 Personal history of nicotine dependence: Secondary | ICD-10-CM | POA: Diagnosis not present

## 2016-02-24 DIAGNOSIS — D62 Acute posthemorrhagic anemia: Secondary | ICD-10-CM | POA: Diagnosis not present

## 2016-02-24 DIAGNOSIS — R2689 Other abnormalities of gait and mobility: Secondary | ICD-10-CM | POA: Diagnosis not present

## 2016-02-24 HISTORY — DX: Cerebral aneurysm, nonruptured: I67.1

## 2016-02-24 HISTORY — DX: Bradycardia, unspecified: R00.1

## 2016-02-24 HISTORY — DX: Nontraumatic subarachnoid hemorrhage, unspecified: I60.9

## 2016-02-24 HISTORY — DX: Atherosclerotic heart disease of native coronary artery without angina pectoris: I25.10

## 2016-02-24 LAB — COMPREHENSIVE METABOLIC PANEL
ALK PHOS: 73 U/L (ref 38–126)
ALT: 34 U/L (ref 17–63)
ANION GAP: 10 (ref 5–15)
AST: 28 U/L (ref 15–41)
Albumin: 3.2 g/dL — ABNORMAL LOW (ref 3.5–5.0)
BILIRUBIN TOTAL: 1.1 mg/dL (ref 0.3–1.2)
BUN: 17 mg/dL (ref 6–20)
CALCIUM: 8.8 mg/dL — AB (ref 8.9–10.3)
CO2: 20 mmol/L — AB (ref 22–32)
Chloride: 107 mmol/L (ref 101–111)
Creatinine, Ser: 1.03 mg/dL (ref 0.61–1.24)
Glucose, Bld: 110 mg/dL — ABNORMAL HIGH (ref 65–99)
Potassium: 4.3 mmol/L (ref 3.5–5.1)
SODIUM: 137 mmol/L (ref 135–145)
TOTAL PROTEIN: 5.9 g/dL — AB (ref 6.5–8.1)

## 2016-02-24 LAB — CBC WITH DIFFERENTIAL/PLATELET
BASOS ABS: 0 10*3/uL (ref 0.0–0.1)
BASOS PCT: 1 %
Eosinophils Absolute: 0.3 10*3/uL (ref 0.0–0.7)
Eosinophils Relative: 3 %
HEMATOCRIT: 34.4 % — AB (ref 39.0–52.0)
HEMOGLOBIN: 11.4 g/dL — AB (ref 13.0–17.0)
Lymphocytes Relative: 15 %
Lymphs Abs: 1.3 10*3/uL (ref 0.7–4.0)
MCH: 31.2 pg (ref 26.0–34.0)
MCHC: 33.1 g/dL (ref 30.0–36.0)
MCV: 94.2 fL (ref 78.0–100.0)
Monocytes Absolute: 0.9 10*3/uL (ref 0.1–1.0)
Monocytes Relative: 11 %
NEUTROS ABS: 6.1 10*3/uL (ref 1.7–7.7)
NEUTROS PCT: 70 %
Platelets: 247 10*3/uL (ref 150–400)
RBC: 3.65 MIL/uL — AB (ref 4.22–5.81)
RDW: 12.9 % (ref 11.5–15.5)
WBC: 8.5 10*3/uL (ref 4.0–10.5)

## 2016-02-24 LAB — URINALYSIS, ROUTINE W REFLEX MICROSCOPIC
BILIRUBIN URINE: NEGATIVE
GLUCOSE, UA: NEGATIVE mg/dL
HGB URINE DIPSTICK: NEGATIVE
KETONES UR: NEGATIVE mg/dL
Leukocytes, UA: NEGATIVE
Nitrite: NEGATIVE
PROTEIN: NEGATIVE mg/dL
Specific Gravity, Urine: 1.019 (ref 1.005–1.030)
pH: 7 (ref 5.0–8.0)

## 2016-02-24 LAB — TROPONIN I
TROPONIN I: 0.24 ng/mL — AB (ref <0.03)
TROPONIN I: 0.16 ng/mL — AB (ref <0.03)
TROPONIN I: 0.18 ng/mL — AB (ref <0.03)

## 2016-02-24 LAB — MAGNESIUM: Magnesium: 1.8 mg/dL (ref 1.7–2.4)

## 2016-02-24 LAB — GLUCOSE, CAPILLARY: Glucose-Capillary: 114 mg/dL — ABNORMAL HIGH (ref 65–99)

## 2016-02-24 MED ORDER — MULTIVITAMINS PO CAPS: 1.0000 | ORAL_CAPSULE | Freq: Every day | ORAL | Status: DC

## 2016-02-24 MED ORDER — ONDANSETRON HCL 4 MG/2ML IJ SOLN: 4.0000 mg | Freq: Four times a day (QID) | INTRAMUSCULAR | Status: DC | PRN

## 2016-02-24 MED ORDER — LISINOPRIL 5 MG PO TABS: 5.0000 mg | ORAL_TABLET | Freq: Every day | ORAL | Status: DC

## 2016-02-24 MED ORDER — ATORVASTATIN CALCIUM 80 MG PO TABS
80.0000 mg | ORAL_TABLET | Freq: Every day | ORAL | Status: DC
  Administered 2016-02-25 – 2016-02-27 (×3): 80 mg via ORAL
  Filled 2016-02-24 (×3): qty 1

## 2016-02-24 MED ORDER — FLAVOXATE HCL 100 MG PO TABS
100.0000 mg | ORAL_TABLET | Freq: Three times a day (TID) | ORAL | Status: DC | PRN
  Filled 2016-02-24: qty 1

## 2016-02-24 MED ORDER — SODIUM CHLORIDE 0.9 % IV SOLN: INTRAVENOUS | Status: DC

## 2016-02-24 MED ORDER — CLOPIDOGREL BISULFATE 75 MG PO TABS
75.0000 mg | ORAL_TABLET | Freq: Every day | ORAL | Status: DC
  Administered 2016-02-25 – 2016-02-28 (×4): 75 mg via ORAL
  Filled 2016-02-24 (×4): qty 1

## 2016-02-24 MED ORDER — ACETAMINOPHEN 325 MG PO TABS: 650.0000 mg | ORAL_TABLET | ORAL | Status: DC | PRN

## 2016-02-24 MED ORDER — ADULT MULTIVITAMIN W/MINERALS CH
1.0000 | ORAL_TABLET | Freq: Every day | ORAL | Status: DC
  Administered 2016-02-25 – 2016-02-28 (×4): 1 via ORAL
  Filled 2016-02-24 (×4): qty 1

## 2016-02-24 MED ORDER — SODIUM CHLORIDE 0.9 % IV BOLUS (SEPSIS): 500.0000 mL | Freq: Once | INTRAVENOUS | Status: DC

## 2016-02-24 NOTE — Progress Notes (Signed)
Forwarding prior psychosocial assessment.  No changes needed.    Social Work Patient ID: Jason Hawkins, male   DOB: 07/31/1928, 82 y.o.   MRN: [id number]  Jason Curb, LCSW Social Worker Signed  Progress Notes 02/14/2016 4:55 PM  Related encounter: Admission (Discharged) from 02/13/2016 in [facility] Social Work Assessment and Plan  Patient Details  Name: Jason Hawkins MRN: [id number] Date of Birth: [birth year]  Today's Date: 02/14/2016  Problem List:  Patient Active Problem List   Diagnosis Date Noted  . Benign essential HTN   . Leukocytosis   . Vascular headache   . SOB (shortness of breath)   . Acute hypoxemic respiratory failure ([place]) 02/04/2016  . Ruptured cerebral aneurysm ([place]) 02/01/2016  . Subarachnoid bleed ([place]) 01/31/2016  . Chronic atrial fibrillation ([place])   . Hypertensive urgency   . SAH (subarachnoid hemorrhage) ([place])   . 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 Well adult exam 01/04/2016  . H/O prostate cancer 04/30/2014  . Fatigue 04/30/2014  . PVD (peripheral vascular disease) with claudication ([place]) 12/21/2013  . Dyslipidemia 12/21/2013  . Elevated BP 05/12/2013  . Bee sting allergy 03/17/2013  . Chronic venous insufficiency 03/17/2013  . Herpes zoster 03/17/2013  . Depression 04/13/2011  . Long term current use of anticoagulant therapy 12/14/2010  . Atrial fibrillation ([place]) 12/27/2009  . Edema 12/27/2009  . S/P AVR 12/27/2009   Past Medical History:  Past Medical History  Diagnosis Date  . Chronic atrial fibrillation ([place])   . Severe aortic stenosis     s/p aortic valve replacement using a pericardial tissue valve  . HTN (hypertension)   . Lower extremity edema   . Hypercholesteremia    Past Surgical History:  Past Surgical  History  Procedure Laterality Date  . Aortic valve replacement  08/30/2003    Dr. Cyndia Bent  . Radiology with anesthesia N/A 02/01/2016    Procedure: RADIOLOGY WITH ANESTHESIA; Surgeon: Consuella Lose, MD; Location: [place]; Service: Radiology; Laterality: N/A;   Social History:  reports that he has quit smoking. He does not have any smokeless tobacco history on file. He reports that he drinks about 8.4 oz of alcohol per week. He reports that he does not use illicit drugs.  Family / Support Systems Marital Status: Widow/Widower How Long?: 5 yrs Patient Roles: Parent, Other (Comment) (grandparent) Children: son, Jason Hawkins @ (C) [phone] (local); daughter, Jason [facility]) @ (C) [phone]; son, Jason Hawkins ([place]). One son died a couple of months ago. Anticipated Caregiver: local son and his family (including teen-aged grandchildren) live "about 58 yards from my house". Daughter is returning to [place] at the end of the week but can return as needed. Local son and dtr-in-law to be primary support.s Ability/Limitations of Caregiver: Family will make sure that someone stays with patient after discharge Caregiver Availability: 24/7 (if needed) Family Dynamics: Daughter and son present during assessment interview and very involved, joking with pt and one another. Pt reports a very good relationship with all.  Social History Preferred language: English Religion: Lutheran Cultural Background: NA Read: Yes Write: Yes Employment Status: Retired Date Retired/Disabled/Unemployed: 78 yrs Freight forwarder Issues: None Guardian/Conservator: Per MD, pt is capable of making decisions on his own behalf   Abuse/Neglect Physical Abuse: Denies Verbal Abuse: Denies Sexual Abuse: Denies Exploitation of patient/patient's resources: Denies Self-Neglect: Denies  Emotional Status Pt's affect, behavior adn adjustment  status: Pt initially appeared reluctant to  engage in interview, however, once he put in his hearing aids and we were able to joke with one another he began to talk more. Present with dark sense of humor (which daughter states is his "normal") and expresses his eagerness to d/c home ASAP. Admits much frustration with his general weakness and continued HA. He denies any significant emotional distress. Feels very supported by family. Will monitor and refer as needed for neuropsychology. Recent Psychosocial Issues: Son died a few months ago. Pyschiatric History: None Substance Abuse History: None  Patient / Family Perceptions, Expectations & Goals Pt/Family understanding of illness & functional limitations: Pt and daughter with good understanding of the [facility] and aneurysm coiling performed. Aware of current functional limitations/ need for CIR. Premorbid pt/family roles/activities: Pt was completely independent, driving and out daily in the community. He "splits time" between home in [place] and [place] and calls himself a "snowbird". Anticipated changes in roles/activities/participation: Dependent on gains made. He may need to assistance initially and family willing to assume caregiver support roles. Pt/family expectations/goals: "I just want these headaches to go away and to go home."  [employer]: None Premorbid Home Care/DME Agencies: None Transportation available at discharge: yes Resource referrals recommended: Neuropsychology  Discharge Planning Living Arrangements: Alone Support Systems: Children, Friends/neighbors Type of Residence: Private residence Insurance Resources: [employer], Multimedia programmer (specify) (generic supplement) Financial Resources: Radio broadcast assistant Screen Referred: No Living Expenses: Own Money Management: Patient Does the patient have any problems obtaining your medications?: No Home Management: pt was independent with this Patient/Family Preliminary Plans: Pt to return  to his own home with family helping as they are able and up to 24/7 if needed. Social Work Anticipated Follow Up Needs: HH/OP Expected length of stay: 10-12 days  Clinical Impression Pleasant gentleman with very dry sense of humor on CIR following aneurysm rupture with SAH. Reports his primary frustration is his significant weakness and constant HA pain. Son and daughter are bedside and all laughing easily with one another with daughter reporting the family will provide whatever level of assist needed. Pt hopeful his LOS will be very short. No s/s of emotional distress reported or noted. Will follow for support and d/c planning needs.  Nandi Tonnesen 02/14/2016, 4:55 PM

## 2016-02-24 NOTE — Progress Notes (Signed)
Cardiology consulted on this pt for hypotension. See H/P on next registration (inpt on 2H) for note as we have readmitted him to [facility]. Ysidro Ramsay PA-C

## 2016-02-24 NOTE — Progress Notes (Signed)
Patient ID: Jason Hawkins, male   DOB: [birth year], 82 y.o.   MRN: [id number] See full note by PA Patient well known to me Admitted 02/01/16 with SAH on coumadin. Had coil embolization of anterior communicating artery. Changed to low dose eliquis for anticoagulation Subsequently at rehab had circumflex MI requiring stenting of the distal vessel and plavix added  History of tissue AVR   Post MI course complicated by UTI Rx  Has had some nausea at rehab.  This am got ? Vagal with bradycardia and hypotension No iv access on floor  ECG with slow afib no acute ST elevation   No chest pain no recurrent headache Exam with BP 86/palp afib rate 64 now SEM through tissue AVr Clear lungs Abdomen is soft No LE edema Neuro non focal.  Plan: IV access Saline 500cc and then NS drip no need for dopamine or atropine at this time Stat CBC, BMET asses Hct and K/Cr Follow serial ECG and enzymes  Jenkins Rouge

## 2016-02-24 NOTE — Patient Care Conference (Signed)
Inpatient RehabilitationTeam Conference and Plan of Care Update Date: 02/04/2016   Time: 2:15 PM    Patient Name: Jason Hawkins      Medical Record Number: [id number]  Date of Birth: 06-28-1928 Sex: Male         Room/Bed: 4W06C/4W06C-01 Payor Info: Payor: MEDICARE / Plan: MEDICARE PART A AND B / Product Type: *No Product type* /    Admitting Diagnosis: Aneurysm Coiling  Admit Date/Time:  02/20/2016  5:28 PM Admission Comments: No comment available   Primary Diagnosis:  <principal problem not specified> Principal Problem: <principal problem not specified>  Patient Active Problem List   Diagnosis Date Noted  . Flat affect   . Status post coronary artery stent placement   . E-coli UTI   . Acute blood loss anemia   . Cognitive deficit, post-stroke 02/16/2016  . Gait disturbance, post-stroke 02/16/2016  . Junctional rhythm 02/16/2016  . Hypotension 02/16/2016  . Hyponatremia 02/16/2016  . Subsequent ST elevation (STEMI) myocardial infarction of inferior wall ([place]) 02/16/2016  . ST elevation myocardial infarction involving left circumflex coronary artery ([place])   . Acute MI, inferior wall, initial episode of care ([place])   . Idioventricular rhythm ([place])   . AV block, 3rd degree (HCC)   . Benign essential HTN   . Leukocytosis   . Vascular headache   . SOB (shortness of breath)   . Acute hypoxemic respiratory failure ([place]) 02/04/2016  . Ruptured cerebral aneurysm ([place]) 02/01/2016  . Subarachnoid bleed ([place]) 01/31/2016  . Chronic atrial fibrillation ([place])   . Hypertensive urgency   . SAH (subarachnoid hemorrhage) ([place])   . 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 Well adult exam 01/04/2016  . H/O prostate cancer 04/30/2014  . Fatigue 04/30/2014  . PVD (peripheral vascular disease) with claudication ([place]) 12/21/2013  . Dyslipidemia 12/21/2013  . Elevated BP 05/12/2013  . Bee sting allergy 03/17/2013  . Chronic venous insufficiency 03/17/2013  .  Herpes zoster 03/17/2013  . Depression 04/13/2011  . Long term current use of anticoagulant therapy 12/14/2010  . Atrial fibrillation ([place]) 12/27/2009  . Edema 12/27/2009  . S/P AVR 12/27/2009    Expected Discharge Date: Expected Discharge Date: 02/28/16  Team Members Present: Physician leading conference: Dr. Delice Lesch Social Worker Present: Lennart Pall, LCSW Nurse Present: Heather Roberts, RN PT Present: Barrie Folk, PT;Victoria Sabra Heck, PT OT Present: Willeen Cass, OT SLP Present: Gunnar Fusi, SLP PPS Coordinator present : Daiva Nakayama, RN, CRRN     Current Status/Progress Goal Weekly Team Focus  Medical   Unsteady gait and LOB with activity secondary to [facility], ACA aneurysm s/p coiling and STEMI  Safety, mobility, treat UTI  see above   Bowel/Bladder   Continent of B&B   Remain B&B  Monitor B&B function   Swallow/Nutrition/ Hydration             ADL's   supervision overall with use of RW for support  Mod I self care except S shower transfer, S with light IADLs  ADL retraining, pt education, general strengthening, balance, cognitive activities   Mobility   min A overall for transfers, ambulation & stair negotiation without an AD/B rails, pt with short term memory recall, close supervision required for static standing balance  mod I for ambulation with LRAD, stair negotiation, transfers, standing balance & bed mobility  pt education, endurance, balance, & gait training   Communication             Safety/Cognition/ Behavioral Observations  Mod I   Mod I  goals met 02/22/16   Pain   No c/o pain at this time  <3  Assess and treat pain during q shift   Skin   bruisng lt flank, Abrasion lt buttocks-foam  No new skin breakdown/infection  Assess skin q shift    Rehab Goals Patient on target to meet rehab goals: Yes *See Care Plan and progress notes for long and short-term goals.  Barriers to Discharge: ABLA, cognitively slightly labile, UTI    Possible Resolutions to  Barriers:  last day of abx today, monitor labs, SLP/Neuropsych eval    Discharge Planning/Teaching Needs:  Plan to d/c home with family providing 24/7 assist initially.  Teaching to be scheduled   Team Discussion:  Returning pt following MI and card surgery.  Still fatigues easily but overall much better functionally than prior to event.  Cognition much improved and goals of mod i.  Currently supervision - min assist with mod independent goals.    Revisions to Treatment Plan:  None   Continued Need for Acute Rehabilitation Level of Care: The patient requires daily medical management by a physician with specialized training in physical medicine and rehabilitation for the following conditions: Daily direction of a multidisciplinary physical rehabilitation program to ensure safe treatment while eliciting the highest outcome that is of practical value to the patient.: Yes Daily medical management of patient stability for increased activity during participation in an intensive rehabilitation regime.: Yes Daily analysis of laboratory values and/or radiology reports with any subsequent need for medication adjustment of medical intervention for : Neurological problems;Cardiac problems;Mood/behavior problems  Casady Voshell 02/24/2016, 6:50 AM

## 2016-02-24 NOTE — Discharge Summary (Signed)
Physician Discharge Summary  Patient ID: Jason Hawkins MRN: [id number] DOB/AGE: [birth year] 82 y.o.  Admit date: 02/20/2016 Discharge date: 02/24/2016  Discharge Diagnoses:  Principal Problem:   Hypotension Active Problems:   Chronic atrial fibrillation (HCC)   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 communicating artery aneurysm (HCC)   E-coli UTI   Acute blood loss anemia   Flat affect   Discharged Condition: Guarded.    Labs:  Basic Metabolic Panel:  Recent Labs Lab 02/18/16 1056 02/21/16 0724  NA 135 136  K 4.0 3.8  CL 107 106  CO2 22 24  GLUCOSE 151* 99  BUN 16 14  CREATININE 0.88 0.86  CALCIUM 8.8* 8.9    CBC:  Recent Labs Lab 02/19/16 0315 02/21/16 0724  WBC 9.2 9.7  NEUTROABS  --  6.4  HGB 11.8* 12.1*  HCT 36.0* 36.1*  MCV 94.5 93.8  PLT 389 334    CBG:  Recent Labs Lab 02/24/16 1241  GLUCAP 114*    Today's Vitals   02/24/16 1247 02/24/16 1255 02/24/16 1300 02/24/16 1315  BP: 95/40 75/51 90/44  94/46  Pulse: 54 65  68  Temp:      TempSrc:      Resp: 16 18    Height:      Weight:      SpO2:  98% 95% 97%  PainSc:        Brief HPI:   Jason Hawkins is a 82 y.o. 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 He was found to have [place] and cerebral angiogram revealed 1.5 X 3 mm ACA aneurysm and INR 1.8. Coumadin reversed and BP elevated therefore he was placed on Cardene drip for BP control. He underwent coil embolization of ACA aneurysm on 02/01/16 by Dr. Kathyrn Sheriff. Post op reported to have CP due to fluid overload and improved with IV diuresis. He was admitted to CIR on 02/13/16 and topamax was added due to reports of severe HA. Lethargy was resolving but he continued to have moderate cognitive deficits and required min assist with ADL and mobility. He developed nauseas with MS changed and was found to be hypotensive with junctional rhythm and repeat EKG showed acute inferior  STEMI.  He was taken to cath lab for work up and underwent successful stenting of distal Left Cx with DES. Transient 3rd degree AVB due to inferior MI has resolved and BP stable. Eliquis resumed and Plavix added X 12 months due to stent. No ASA due to recent SAH and no BB due to slow ventricular rate in AF. Echo with normal function of bioprosthetic valve. Blood pressures trending up and low dose lisinopril added for tighter control. He has had complaints of frequency and was started on IV ampicillin for enterococcus UTI. Therapy resumed and patient continues to have narrow BOS, drift to right/left and decreased in activity tolerance. He was admitted back to CIR to complete his rehab course.    Hospital Course: Jason Hawkins was admitted to rehab 02/20/2016 for inpatient therapies to consist of PT, ST and OT at least three hours five days a week. Past admission physiatrist, therapy team and rehab RN have worked together to provide customized collaborative inpatient rehab.  Blood pressures have been monitored on bid and have been slowly trending up with increase in activity level. Heart rate has been stable in 60's range.  Ensure was added to help with low protein malnutrition. His po intake has been improved and he  is continent of bowel and bladder.  He was placed on urispas at admission for bladder spasms and has  completed 5 day course of PCN on 07/20.  Follow up CBC shows that H/H has been stable. He has been making steady progress with increase in activity tolerance. He developed headaches with nausea on 07/21 while ambulating with PT. SBP was noted to be in 70's and improved  to 80's when returned to bed.  EKG, stat labs and fluid bolus was ordered for work up. Cardiology was consulted due to concerns of recurrent cardiac event and EKG showed evidence of junctional rhythm. He was discharged to cardiac ICU on 02/24/16 for closer monitoring.     Rehab course: During patient's stay in rehab weekly team  conferences were held to monitor patient's progress, set goals and discuss barriers to discharge.  Evaluation at admission showed mild cognitive deficits. He required  Supervision with ADL tasks and min assist with mobility. He has had improvement in activity tolerance, balance, postural control, as well as ability to compensate for deficits. He is able to complete ADL tasks with supervision and extra time to complete all tasks. He is ambulating 200' with RW and supervision. He requires multiple rest breaks with all activity and has been educated on HEP, energy conservation and safety awareness.      Disposition: Cardiac  ICU  Diet: NPO      Medication List    TAKE these medications        apixaban 2.5 MG Tabs tablet  Commonly known as:  ELIQUIS  Take 1 tablet (2.5 mg total) by mouth 2 (two) times daily.     atorvastatin 80 MG tablet  Commonly known as:  LIPITOR  Take 1 tablet (80 mg total) by mouth daily at 6 PM.     clopidogrel 75 MG tablet  Commonly known as:  PLAVIX  Take 1 tablet (75 mg total) by mouth daily with breakfast.     lisinopril 2.5 MG tablet  Commonly known as:  PRINIVIL,ZESTRIL  Take 1 tablet (2.5 mg total) by mouth daily.     multivitamin capsule  Take 1 capsule by mouth daily.        Follow-up Information    Follow up with Walker Kehr, MD On 03/13/2016.   Specialty:  Internal Medicine   Why:  @ 10:30 am (hospital follow up)   Contact information:   [place] [address] [phone]       Follow up with [facility], NEELESH, C, MD. Schedule an appointment as soon as possible for a visit today.   Specialty:  Neurosurgery   Why:  for follow appointment.    Contact information:   [address] [place] [address] [place] [address] [phone]       Follow up with Jamse Arn, MD.   Specialty:  Physical Medicine and Rehabilitation   Why:  office will call you with follow up appointment.   Contact information:   [place] [address] [phone]       Signed: Bary Leriche 02/24/2016, 1:33 PM

## 2016-02-24 NOTE — Progress Notes (Signed)
[place] PHYSICAL [facility]ICINE & REHABILITATION     PROGRESS NOTE  Subjective/Complaints:  Pt sitting up in bed.  He is happy about his discharge date being moved up.  He is looking forward to going home.    ROS: Denies CP, SOB, N/V/D.  Objective: Vital Signs: Blood pressure 147/77, pulse 69, temperature 98.7 F (37.1 C), temperature source Oral, resp. rate 17, height 5' 9.5" (1.765 m), weight 68.72 kg (151 lb 8 oz), SpO2 96 %. No results found. No results for input(s): WBC, HGB, HCT, PLT in the last 72 hours. No results for input(s): NA, K, CL, GLUCOSE, BUN, CREATININE, CALCIUM in the last 72 hours.  Invalid input(s): CO CBG (last 3)  No results for input(s): GLUCAP in the last 72 hours.  Wt Readings from Last 3 Encounters:  02/24/16 68.72 kg (151 lb 8 oz)  02/20/16 77.656 kg (171 lb 3.2 oz)  02/15/16 88.5 kg (195 lb 1.7 oz)    Physical Exam:  BP 147/77 mmHg  Pulse 69  Temp(Src) 98.7 F (37.1 C) (Oral)  Resp 17  Ht 5' 9.5" (1.765 m)  Wt 68.72 kg (151 lb 8 oz)  BMI 22.06 kg/m2  SpO2 96% Constitutional: He appears well-developed and well-nourished. No distress.  HENT: Normocephalic and atraumatic.  Eyes: Conjunctivae and EOM are normal.   Cardiovascular: Normal rate. Occasional extrasystoles are present.  Respiratory: Effort normal and breath sounds normal. No stridor.  GI: Soft. Bowel sounds are normal. He exhibits no distension. There is no tenderness.  Musculoskeletal: He exhibits no edema or tenderness.  Neurological: He is alert and oriented.  Fatigued appearing with slow speech.  Able to follow basic motor commands.  Motor: B/l UE: 4+5 proximal to distal B/l LE: 4-4+/5 proximal to distal  Skin: Skin is warm and dry.  Psychiatric: His affect is blunt. He is slowed.   Assessment/Plan: 1. Functional deficits secondary to to [facility], ACA aneurysm s/p coiling and STEMI which require 3+ hours per day of interdisciplinary therapy in a comprehensive inpatient rehab  setting. Physiatrist is providing close team supervision and 24 hour management of active medical problems listed below. Physiatrist and rehab team continue to assess barriers to discharge/monitor patient progress toward functional and medical goals.  Function:  Bathing Bathing position   Position: Shower  Bathing parts Body parts bathed by patient: Right arm, Left arm, Chest, Abdomen, Front perineal area, Buttocks, Right upper leg, Right lower leg, Left upper leg, Left lower leg Body parts bathed by helper: Back  Bathing assist Assist Level: Supervision or verbal cues      Upper Body Dressing/Undressing Upper body dressing   What is the patient wearing?: Button up shirt         Button up shirt - Perfomed by patient: Thread/unthread right sleeve, Thread/unthread left sleeve, Pull shirt around back, Button/unbutton shirt      Upper body assist Assist Level: No help, No cues      Lower Body Dressing/Undressing Lower body dressing   What is the patient wearing?: Pants, Shoes     Pants- Performed by patient: Thread/unthread right pants leg, Thread/unthread left pants leg, Pull pants up/down, Fasten/unfasten pants     Non-skid slipper socks- Performed by helper: Don/doff right sock, Don/doff left sock     Shoes - Performed by patient: Don/doff right shoe, Don/doff left shoe            Lower body assist Assist for lower body dressing: Supervision or verbal cues  Toileting Toileting   Toileting steps completed by patient: Adjust clothing prior to toileting, Performs perineal hygiene, Adjust clothing after toileting      Toileting assist Assist level: More than reasonable time   Transfers Chair/bed transfer   Chair/bed transfer method: Ambulatory Chair/bed transfer assist level: Supervision or verbal cues Chair/bed transfer assistive device: Medical sales representative     Max distance: 260ft Assist level: No help, No cues, assistive device, takes  more than a reasonable amount of time   Wheelchair   Type: Manual Max wheelchair distance: 160 Assist Level: No help, No cues, assistive device, takes more than reasonable amount of time  Cognition Comprehension Comprehension assist level: Follows basic conversation/direction with extra time/assistive device  Expression Expression assist level: Expresses basic needs/ideas: With extra time/assistive device  Social Interaction Social Interaction assist level: Interacts appropriately with others with medication or extra time (anti-anxiety, antidepressant).  Problem Solving Problem solving assist level: Solves basic problems with no assist  Memory Memory assist level: Recognizes or recalls 90% of the time/requires cueing < 10% of the time     Medical Problem List and Plan: 1. Unsteady gait and LOB with activity secondary to [facility] [place], ACA aneurysm s/p coiling and STEMI.  Cont CIR  Pt would like to move up discharge date. 2. DVT Prophylaxis/Anticoagulation: Pharmaceutical: Other (comment)--Eliquis 3. Pain Management: Tylenol prn for occasional HA.  4. Mood: LCSW to follow for evaluation and support.  5. Neuropsych: This patient is not fully capable of making decisions on his own behalf.  Neuropsych consult pending 6. Skin/Wound Care:Routine pressure relief measures. Maintain adequate nutrition and hydration status.  7. Fluids/Electrolytes/Nutrition: Monitor I/O.   BMP WNL on 7/18 8. STEMI s/p DES LCx: On plavix and high dose lipitor. NO ASA due to recent [facility]. 9. HTN: Monitor BP tid.   Overall controlled, however, elevated over the last 24 hours.  Lisinopril increased to 5mg  on 7/21 10. Enterococcus UTI: Antibiotic completed.   PVRs WNL  Added urispas.  11. Hyponatremia: Resolved.  12. A. Fib: Cont meds, monitor for signs/symptoms of bleeding 13. ABLA:   Hb 12.1 on 7/18  Cont to monitor  LOS (Days) 4 A FACE TO FACE EVALUATION WAS PERFORMED  Jason Hawkins Jason Hawkins 02/24/2016 9:36 AM

## 2016-02-24 NOTE — Progress Notes (Signed)
Social Work Patient ID: Jason Hawkins, male   DOB: 1928-07-25, 82 y.o.   MRN: [id number]   Met with pt following team conf Wednesday afternoon.  Reviewed goals of mod i and target d/c date of next Tues the 25th.  Pt quickly states "that's too long" and said that he had hoped to d/c this Sat.  I discussed pt's request with MD and therapies and all agreed that d/c date could be moved up, however, likely would not reach mod i and may need supervision.  Pt and son, [name], reports family will be providing 24/7 supervision to pt for at least a month post d/c.  Pt, family and tx team all agreed to changed d/c to 7/22.  Kema Santaella, LCSW

## 2016-02-24 NOTE — Significant Event (Signed)
Rapid Response Event Note  Overview:  Called to assist with patient with syncope Time Called: 1234 Arrival Time: 1245 Event Type: Hypotension, Cardiac  Initial Focused Assessment:  On my arrival - patient now back in bed - grey - warm and dry - alert - oriented and co-op - denies chest pain, SOB  or nausea at this point - did have nausea before he walked with PT - PT reports he was walking - felt bad - back to bed and BP was low 63/45 - HR 50's - O2 sats were 95% on RA.  12 lead EKG done - shows slow afib- Dr. Johnsie Cancel has been here Algis Liming PA now present in room.      Interventions:  Stat IV's started - #20 angio left arm and #20 right arm.  NS 500cc bolus started.  Bil BS present - clear.  Abd soft. No pedal edema.  No JVD noted.    Tol bolus - color better -states he feels better.  BP 94/46 HR 68 RR 16 O2 sats 97%.  Ed RN giving report - will transfer to [place].  Dr. Johnsie Cancel back in to reevaluate. BP 96/48 HR 64 - transferred to 2H01 - stable handoff to [facility].    Plan of Care (if not transferred):  Event Summary: Name of Physician Notified: Algis Liming PA at    Name of Consulting Physician Notified: Dr. Johnsie Cancel at    Outcome: Transferred (Comment) [phone])  Event End Time: 1335  Quin Hoop

## 2016-02-24 NOTE — Progress Notes (Signed)
Writer notified that patient had become hypotensive during therapy. Patient stated that he felt weak and nausaus, patient returned to his bed and manual; pressure checked, EKG done, IV started and a 558ml bolus was administered. Patient is being moved to 2 Heart, room !. Report called.

## 2016-02-24 NOTE — H&P (Signed)
Cardiology History & Physical    Patient ID: Jason Hawkins MRN: [id number], DOB: [birth year] Date of Encounter: 02/24/2016, 1:01 PM Primary Physician: Walker Kehr, MD Primary Cardiologist:Nishan  Chief Complaint: weak, pale Reason for Admission: hypotension BP 60s, HR 38s Requesting MD: Ms. Erling Cruz PA-C (CIR)  HPI: 82 y/o M with history of chronic a-fib, AS s/p biological valve replacement, HTN HLD, recent admission for [facility] (due to brain aneurysm, s/p coil embolization [id number]) complicated by STEMI/bradycardia while on rehab whom we are asked to see urgently due to hypotension.   To recap recent hx, he was admitted in June 2017 for [facility] s/p coiling as above. He was discharged to inpatient rehab and was started on Eliquis (previously on Coumadin). While on rehab on 02/16/16 he began to feel weak with pre-syncope and nausea. Rapid response was called to the bedside and noted to be bradycardic and hypotensive. He was pale, cool and clammy. Bp noted 71/37. Stat EKG done which showed a junctional rhythm at a rate of 30 - initially EP consult was called for urgent pacemaker, but repeat EKG showed acute inferior MI so he was taken emergently to the cath lab. LHC 02/16/16: 100% distal Cx s/p DES; 30% prox-mid RCA, 30%, prox-mid LAD, 80% OM1. 2D Echo 02/17/16: EF 60-65%, severely dilated RA. Post-PCI course was fairly uncomplicated aside from UTI. He was discharged again to inpatient rehab on 02/20/16 on Eliquis and Plavix.  He has been progressing well at therapy and his anticipated discharge date was tomorrow. Today he was working with PT when he suddenly felt weak and went pale. He has also had some nausea as well. PT checked his BP automatically and it was 63/45. They called for his nurse and subsequent BPs were 80/59, 85/45. HR by initial EKG was 43 (atrial fib), coming up to the 60s without intervention. He has not had any chest pain or dyspnea. Pulse ox 95% RA. No chest pain, dyspnea. CBG 114. Labs  pending. 500cc saline bolus running in. We have written for him to be transferred for admission into 2H. He has had chronic nausea since initial admission. No abdominal pain, GI bleeding, diarrhea, constipation, or fevers.  BP coming up with IVF - last checked 94/46.  Past Medical History  Diagnosis Date  . Chronic atrial fibrillation ([place])   . Severe aortic stenosis     s/p aortic valve replacement using a pericardial tissue valve  . HTN (hypertension)   . Lower extremity edema   . Hypercholesteremia   . Subarachnoid bleed ([place])     a. due to brain aneurysm 01/2016.  . Brain aneurysm     a. s/p coiling 01/2016.  [name] CAD (coronary artery disease)     a. STEMI 02/2016 while on rehab s/p DES to Cx, EF 60-65%.  . Bradycardia     a. prior to STEMI 02/2016 -> improved with stenting.     Surgical History:  Past Surgical History  Procedure Laterality Date  . Aortic valve replacement  08/30/2003    Dr. Cyndia Bent  . Radiology with anesthesia N/A 02/01/2016    Procedure: RADIOLOGY WITH ANESTHESIA;  Surgeon: Consuella Lose, MD;  Location: [facility];  Service: Radiology;  Laterality: N/A;  . Cardiac catheterization N/A 02/16/2016    Procedure: Left Heart Cath and Coronary Angiography;  Surgeon: Peter M Martinique, MD;  Location: [place] [facility];  Service: Cardiovascular;  Laterality: N/A;  . Cardiac catheterization N/A 02/16/2016    Procedure: Coronary Stent Intervention;  Surgeon: Collier Salina  M Martinique, MD;  Location: [place] [facility];  Service: Cardiovascular;  Laterality: N/A;     Home Meds: Prior to Admission medications   Medication Sig Start Date End Date Taking? Authorizing Provider  apixaban (ELIQUIS) 2.5 MG TABS tablet Take 1 tablet (2.5 mg total) by mouth 2 (two) times daily. 02/20/16   Arbutus Leas, NP  atorvastatin (LIPITOR) 80 MG tablet Take 1 tablet (80 mg total) by mouth daily at 6 PM. 02/20/16   Arbutus Leas, NP  clopidogrel (PLAVIX) 75 MG tablet Take 1 tablet (75 mg total) by mouth daily with  breakfast. 02/20/16   Arbutus Leas, NP  lisinopril (PRINIVIL,ZESTRIL) 2.5 MG tablet Take 1 tablet (2.5 mg total) by mouth daily. 02/20/16   Arbutus Leas, NP  Multiple Vitamin (MULTIVITAMIN) capsule Take 1 capsule by mouth daily.      Historical Provider, MD    Allergies:  Allergies  Allergen Reactions  . Bee Venom Anaphylaxis  . Codeine Nausea Only    Social History   Social History  . Marital Status: Widowed    Spouse Name: N/A  . Number of Children: N/A  . Years of Education: N/A   Occupational History  . Not on file.   Social History Main Topics  . Smoking status: Former Research scientist (life sciences)  . Smokeless tobacco: Not on file  . Alcohol Use: 8.4 oz/week    14 Cans of beer per week  . Drug Use: No  . Sexual Activity: No   Other Topics Concern  . Not on file   Social History Narrative     Family History  Problem Relation Age of Onset  . Heart attack    . Diabetes    . Diabetes Mother   . Heart disease Father   . Heart attack Father     Review of Systems: All other systems reviewed and are otherwise negative except as noted above.  Labs:   Lab Results  Component Value Date   WBC 9.7 02/21/2016   HGB 12.1* 02/21/2016   HCT 36.1* 02/21/2016   MCV 93.8 02/21/2016   PLT 334 02/21/2016    Recent Labs Lab 02/21/16 0724  NA 136  K 3.8  CL 106  CO2 24  BUN 14  CREATININE 0.86  CALCIUM 8.9  PROT 6.2*  BILITOT 1.0  ALKPHOS 73  ALT 28  AST 23  GLUCOSE 99   No results for input(s): CKTOTAL, CKMB, TROPONINI in the last 72 hours. Lab Results  Component Value Date   CHOL 157 02/16/2016   HDL 46 02/16/2016   LDLCALC 100* 02/16/2016   TRIG 57 02/16/2016   No results found for: DDIMER  Radiology/Studies:  Ct Angio Head W Or Wo Contrast  01/31/2016  CLINICAL DATA:  Subarachnoid hemorrhage. No known trauma. On Coumadin for aortic valve replacement. EXAM: CT ANGIOGRAPHY HEAD AND NECK TECHNIQUE: Multidetector CT imaging of the head and neck was performed using the  standard protocol during bolus administration of intravenous contrast. Multiplanar CT image reconstructions and MIPs were obtained to evaluate the vascular anatomy. Carotid stenosis measurements (when applicable) are obtained utilizing NASCET criteria, using the distal internal carotid diameter as the denominator. CONTRAST:  50 mL Isovue 370 IV COMPARISON:  CT head 01/31/2016 FINDINGS: CTA NECK Aortic arch: Atherosclerotic calcification aortic arch without aneurysm or dissection. Atherosclerotic calcification proximal great vessels which are widely patent. Lung apices clear. Right carotid system: Right common carotid artery widely patent. Mild atherosclerotic calcification in the right carotid bifurcation  without significant stenosis. Left carotid system: Left common carotid artery widely patent. Atherosclerotic calcification of the carotid bifurcation without significant stenosis. Vertebral arteries:Both vertebral arteries are patent to the basilar without significant vertebral stenosis. Skeleton: Moderately severe cervical spondylosis. No fracture or mass lesion. Other neck: Negative for mass or adenopathy in the neck. CTA HEAD Anterior circulation: Mild atherosclerotic calcification in the cavernous carotid artery bilaterally. No aneurysm in the cavernous carotid. Anterior and middle cerebral arteries patent bilaterally. 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 This is presumably the cause of the symmetric subarachnoid hemorrhage. Posterior circulation: Both vertebral arteries patent to the basilar. PICA patent. Basilar widely patent. Superior cerebellar and posterior cerebral arteries patent bilaterally. 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 This is presumably the cause of acute subarachnoid hemorrhage. No other aneurysm. Mild atherosclerotic disease the carotid  bifurcation bilaterally without significant carotid stenosis. No significant vertebral stenosis. These results were called by telephone at the time of interpretation on 01/31/2016 at 10:12 pm to Dr. Wallie Char , who verbally acknowledged these results. Electronically Signed   By: Franchot Gallo M.D.   On: 01/31/2016 22:14   Ct Head Wo Contrast  02/03/2016  CLINICAL DATA:  Continued surveillance subarachnoid hemorrhage. EXAM: CT HEAD WITHOUT CONTRAST TECHNIQUE: Contiguous axial images were obtained from the base of the skull through the vertex without intravenous contrast. COMPARISON:  Multiple priors. FINDINGS: The patient has undergone endovascular treatment with coil embolization of an anterior communicating artery aneurysm. Coil mass appears compact without migration. Resolution of previously noted blood in the basilar and interhemispheric cisterns. Slight layering hemorrhage in the occipital horns of the lateral ventricles but no intervening hydrocephalus, and no new subarachnoid blood. No visible cerebral infarction due to vasospasm. Mild sinus fluid predominantly sphenoid, likely due to recumbency. No mastoid fluid. Calvarium intact. Negative orbits. IMPRESSION: Satisfactory appearance status post coiling of an ACom aneurysm. No postprocedural complications.  No intervening hydrocephalus. Electronically Signed   By: Staci Righter M.D.   On: 02/03/2016 11:16   Ct Head Wo Contrast  01/31/2016  CLINICAL DATA:  Sudden onset of headaches EXAM: CT HEAD WITHOUT CONTRAST TECHNIQUE: Contiguous axial images were obtained from the base of the skull through the vertex without intravenous contrast. COMPARISON:  None. FINDINGS: Bony calvarium is intact. There are changes consistent with subarachnoid hemorrhage along the distribution of the middle cerebral arteries and anterior cerebral arteries bilaterally. Given the patient's clinical history these changes are consistent with ruptured aneurysm. No  intraventricular hemorrhage is noted. The basilar cisterns are not filled with blood. No mass lesion is noted. No acute infarct is seen. Mild atrophic changes are noted. IMPRESSION: Subarachnoid hemorrhage as described above. A definitive aneurysm is not noted on this exam. Critical Value/emergent results were called by telephone at the time of interpretation on 01/31/2016 at 9:28 pm to Dr. Nicole Kindred, who verbally acknowledged these results. Electronically Signed   By: Inez Catalina M.D.   On: 01/31/2016 21:30   Ct Angio Neck W Or Wo Contrast  01/31/2016  CLINICAL DATA:  Subarachnoid hemorrhage. No known trauma. On Coumadin for aortic valve replacement. EXAM: CT ANGIOGRAPHY HEAD AND NECK TECHNIQUE: Multidetector CT imaging of the head and neck was performed using the standard protocol during bolus administration of intravenous contrast. Multiplanar CT image reconstructions and MIPs were obtained to evaluate the vascular anatomy. Carotid stenosis measurements (when applicable)  are obtained utilizing NASCET criteria, using the distal internal carotid diameter as the denominator. CONTRAST:  50 mL Isovue 370 IV COMPARISON:  CT head 01/31/2016 FINDINGS: CTA NECK Aortic arch: Atherosclerotic calcification aortic arch without aneurysm or dissection. Atherosclerotic calcification proximal great vessels which are widely patent. Lung apices clear. Right carotid system: Right common carotid artery widely patent. Mild atherosclerotic calcification in the right carotid bifurcation without significant stenosis. Left carotid system: Left common carotid artery widely patent. Atherosclerotic calcification of the carotid bifurcation without significant stenosis. Vertebral arteries:Both vertebral arteries are patent to the basilar without significant vertebral stenosis. Skeleton: Moderately severe cervical spondylosis. No fracture or mass lesion. Other neck: Negative for mass or adenopathy in the neck. CTA HEAD Anterior circulation:  Mild atherosclerotic calcification in the cavernous carotid artery bilaterally. No aneurysm in the cavernous carotid. Anterior and middle cerebral arteries patent bilaterally. 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 This is presumably the cause of the symmetric subarachnoid hemorrhage. Posterior circulation: Both vertebral arteries patent to the basilar. PICA patent. Basilar widely patent. Superior cerebellar and posterior cerebral arteries patent bilaterally. 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 This is presumably the cause of acute subarachnoid hemorrhage. No other aneurysm. Mild atherosclerotic disease the carotid bifurcation bilaterally without significant carotid stenosis. No significant vertebral stenosis. These results were called by telephone at the time of interpretation on 01/31/2016 at 10:12 pm to Dr. Wallie Char , who verbally acknowledged these results. Electronically Signed   By: Franchot Gallo M.D.   On: 01/31/2016 22:14   Dg Chest Port 1 View  02/08/2016  CLINICAL DATA:  Dyspnea and lethargy for 1 day, history hypertension, atrial fibrillation, aortic stenosis post AVR, prostate cancer EXAM: PORTABLE CHEST 1 VIEW COMPARISON:  Portable exam 1953 hours compared to 02/05/2016 FINDINGS: Enlargement of cardiac silhouette post median sternotomy and AVR. Atherosclerotic calcification aortic arch. Mediastinal contours and pulmonary vascularity normal. Bibasilar atelectasis with improved aeration at LEFT base since previous exam. Tiny LEFT pleural effusion. Improved RIGHT upper lobe infiltrate. No pneumothorax. Degenerative changes RIGHT AC joint. IMPRESSION: Bibasilar atelectasis and tiny LEFT pleural effusion. Improved RIGHT upper lobe infiltrate. Improved aeration since previous exam. Enlargement of cardiac silhouette post AVR. Electronically Signed    By: Lavonia Dana M.D.   On: 02/08/2016 20:04   Dg Chest Port 1 View  02/05/2016  CLINICAL DATA:  Acute respiratory failure Fever of 100 this morning O2 at 96% EXAM: PORTABLE CHEST - 1 VIEW COMPARISON:  the previous day's study FINDINGS: Slight improvement in the focal right upper and lower lobe airspace disease. Left retrocardiac consolidation/atelectasis persists. Heart size upper limits normal.  Previous CABG and AVR. Small pleural effusions persist. Degenerative changes in the right shoulder. IMPRESSION: 1. Slight improvement in right lung airspace disease. 2. Small effusions with persistent left retrocardiac consolidation/atelectasis. Electronically Signed   By: Lucrezia Europe M.D.   On: 02/05/2016 08:40   Dg Chest Port 1 View  02/04/2016  CLINICAL DATA:  Shortness of Breath EXAM: PORTABLE CHEST 1 VIEW COMPARISON:  10/19/2003 FINDINGS: Cardiac shadow is mildly enlarged. Postsurgical changes are again seen. Diffuse patchy infiltrates are noted throughout the right lung as well as to a lesser degree in the left lung base. No bony abnormality is noted. IMPRESSION: Bilateral airspace opacities right greater than left. Followup examination is recommended. Electronically Signed   By: Linus Mako.D.  On: 02/04/2016 10:11   Wt Readings from Last 3 Encounters:  02/24/16 170 lb 4.8 oz (77.248 kg)  02/20/16 171 lb 3.2 oz (77.656 kg)  02/15/16 195 lb 1.7 oz (88.5 kg)    EKG:1) atrial fib 66bpm nonspecific ST-T changes, TWI III 2) atrial fib 43bpm nonspecific ST-T changes, TWI III  Physical Exam: Temp 97.9, HR 66, RR 18, BP 84/45, pulse ox 95% RA General: Well developed, well nourished, in no acute distress. Ashen appearance Head: Normocephalic, atraumatic, sclera non-icteric, no xanthomas, nares are without discharge.  Neck: JVD not elevated. Lungs: Clear bilaterally to auscultation without wheezes, rales, or rhonchi. Breathing is unlabored. Heart: Irregularly irregular, rate controlled, with S1 S2. No  murmurs, rubs, or gallops appreciated. Abdomen: Soft, non-tender, non-distended with normoactive bowel sounds. No hepatomegaly. No rebound/guarding. No obvious abdominal masses. Msk:  Strength and tone appear normal for age. Extremities: No clubbing or cyanosis. No edema.  Distal pedal pulses are 2+ and equal bilaterally. Neuro: Alert and oriented X 3. No focal deficit. No facial asymmetry. Moves all extremities spontaneously. Psych:  Responds to questions appropriately with a normal affect.    Assessment and Plan   1. Symptomatic hypotension - patient seen urgently by Dr. Johnsie Cancel who raises question vagal event in the setting of nausea. 500 cc saline bolus given. Continue 100cc/hr for now. Check labs. Hold lisinopril. Follow on telemetry. Transfer to [facility] for close monitoring.  2. Chronic atrial fib with slow ventricular response (HR 43 by EKG) with recent junctional rhythm  - monitor closely on tele. Check lytes, TSH. Will hold Eliquis in case further procedures are needed.  3. HTN - follow off lisinopril.  4. CAD s/p recent STEMI, PCI - no recurrent anginal sx. No BB due to bradycardia. Continue Plavix, statin.  5. Nausea - unclear etiology. Recheck UA. Abdomen soft. Consider GI consult if this persists.  Signed, Charlie Pitter PA-C 02/24/2016, 1:01 PM Pager: [phone]  See separate progress note from acute floor visit on 4W rehab No chest pain and no rebump in troponin. Exam with mild nausea Benign abdomen SEM through tissue AVR, no focal neuro signs Or headache. ECG slow afib chronic with no acute ST changes Low BP in setting of nausea. Responded to fluid bolus. HR 70-80 Now with BP 100/systolic.  Hold lisinopril and eliquis Continue plavix With recent stent to distal circumflex.  Re check UA, follow Hct BMET And serial enzymes.  Second ECG also non acute.  Admitted to 2h01 Family called and updated ( son [name])  Jenkins Rouge

## 2016-02-24 NOTE — Progress Notes (Addendum)
I used the D/C Readmit button as shown by the rehab team to order admission orders from 4w, but I do not see that they carried over. The nurse confirms she can't see them. Have re-ordered them. Not sure what happened since I could still see several of the labs for example under the labs tab as Sign&Held. To avoid any duplicates being mysteriously released, I went back into his 4w encounter and just discontinued out my original admit orders. Dayna Dunn PA-C

## 2016-02-24 NOTE — Progress Notes (Signed)
Trop 0.24. Could be downtrend from prior MI (trop 29 one week ago). F/u EKG nonacute. D/w Dr. Johnsie Cancel - plan remains the same, continue to trend and keep off apixaban in case any procedures are needed. Per Dr. Johnsie Cancel can resume Eliquis in AM if hematocrit is OK and troponin <1. If troponin continues to rise may need to consider coverage with heparin per pharmacy starting this evening. Nurse made aware of plan. Dayna Dunn PA-C

## 2016-02-25 ENCOUNTER — Encounter (HOSPITAL_COMMUNITY): Payer: Self-pay | Admitting: *Deleted

## 2016-02-25 DIAGNOSIS — I10 Essential (primary) hypertension: Secondary | ICD-10-CM

## 2016-02-25 LAB — CBC
HEMATOCRIT: 36.9 % — AB (ref 39.0–52.0)
HEMOGLOBIN: 12.4 g/dL — AB (ref 13.0–17.0)
MCH: 32 pg (ref 26.0–34.0)
MCHC: 33.6 g/dL (ref 30.0–36.0)
MCV: 95.1 fL (ref 78.0–100.0)
Platelets: 208 10*3/uL (ref 150–400)
RBC: 3.88 MIL/uL — AB (ref 4.22–5.81)
RDW: 12.9 % (ref 11.5–15.5)
WBC: 7.6 10*3/uL (ref 4.0–10.5)

## 2016-02-25 LAB — BASIC METABOLIC PANEL
Anion gap: 8 (ref 5–15)
BUN: 17 mg/dL (ref 6–20)
CHLORIDE: 108 mmol/L (ref 101–111)
CO2: 21 mmol/L — ABNORMAL LOW (ref 22–32)
Calcium: 8.7 mg/dL — ABNORMAL LOW (ref 8.9–10.3)
Creatinine, Ser: 0.85 mg/dL (ref 0.61–1.24)
GFR calc non Af Amer: >60 mL/min (ref >60)
Glucose, Bld: 90 mg/dL (ref 65–99)
POTASSIUM: 3.8 mmol/L (ref 3.5–5.1)
SODIUM: 137 mmol/L (ref 135–145)

## 2016-02-25 LAB — TROPONIN I: Troponin I: 0.19 ng/mL (ref <0.03)

## 2016-02-25 MED ORDER — PANTOPRAZOLE SODIUM 20 MG PO TBEC: 20.0000 mg | DELAYED_RELEASE_TABLET | Freq: Every day | ORAL | Status: DC

## 2016-02-25 MED ORDER — APIXABAN 2.5 MG PO TABS
2.5000 mg | ORAL_TABLET | Freq: Two times a day (BID) | ORAL | Status: DC
  Administered 2016-02-25 – 2016-02-28 (×7): 2.5 mg via ORAL
  Filled 2016-02-25 (×7): qty 1

## 2016-02-25 MED ORDER — FAMOTIDINE 20 MG PO TABS
20.0000 mg | ORAL_TABLET | Freq: Two times a day (BID) | ORAL | Status: DC
Start: 2016-02-25 — End: 2016-02-28
  Administered 2016-02-25 – 2016-02-28 (×6): 20 mg via ORAL
  Filled 2016-02-25 (×6): qty 1

## 2016-02-25 NOTE — Progress Notes (Signed)
DAILY PROGRESS NOTE  Subjective:  Blood pressure improved overnight - no further hypotension. HR has improved as well. Troponin trended down from recent MI. H/H stable.  Objective:  Temp:  [97.5 F (36.4 C)-98.7 F (37.1 C)] 98.3 F (36.8 C) (07/22 0400) Pulse Rate:  [51-83] 62 (07/22 0700) Resp:  [16-28] 21 (07/22 0700) BP: (63-159)/(40-118) 144/81 mmHg (07/22 0700) SpO2:  [95 %-100 %] 99 % (07/22 0700) Weight:  [170 lb 3.1 oz (77.2 kg)-170 lb 4.8 oz (77.248 kg)] 170 lb 3.1 oz (77.2 kg) (07/21 1400) Weight change:   Intake/Output from previous day: 07/21 0701 - 07/22 0700 In: 1317.5 [I.V.:1317.5] Out: 1025 [Urine:1025]  Intake/Output from this shift:    Medications: Current Facility-Administered Medications  Medication Dose Route Frequency Provider Last Rate Last Dose  . 0.9 %  sodium chloride infusion   Intravenous Continuous Minus Breeding, MD 10 mL/hr at 02/25/16 0145    . acetaminophen (TYLENOL) tablet 650 mg  650 mg Oral Q4H PRN Dayna N Dunn, PA-C      . atorvastatin (LIPITOR) tablet 80 mg  80 mg Oral q1800 Dayna N Dunn, PA-C      . clopidogrel (PLAVIX) tablet 75 mg  75 mg Oral Daily Dayna N Dunn, PA-C      . flavoxATE (URISPAS) tablet 100 mg  100 mg Oral TID PRN Dayna N Dunn, PA-C      . multivitamin with minerals tablet 1 tablet  1 tablet Oral Daily Josue Hector, MD      . ondansetron (ZOFRAN) injection 4 mg  4 mg Intravenous Q6H PRN Charlie Pitter, PA-C        Physical Exam: General appearance: alert and no distress Lungs: clear to auscultation bilaterally Heart: regular rate and rhythm Extremities: extremities normal, atraumatic, no cyanosis or edema Neurologic: Grossly normal  Lab Results: Results for orders placed or performed during the hospital encounter of 02/24/16 (from the past 48 hour(s))  Troponin I     Status: Abnormal   Collection Time: 02/24/16  2:04 PM  Result Value Ref Range   Troponin I 0.16 (HH) <0.03 ng/mL    Comment: CRITICAL VALUE  NOTED.  VALUE IS CONSISTENT WITH PREVIOUSLY REPORTED AND CALLED VALUE.  Urinalysis, Routine w reflex microscopic (not at [facility])     Status: None   Collection Time: 02/24/16  4:26 PM  Result Value Ref Range   Color, Urine YELLOW YELLOW   APPearance CLEAR CLEAR   Specific Gravity, Urine 1.019 1.005 - 1.030   pH 7.0 5.0 - 8.0   Glucose, UA NEGATIVE NEGATIVE mg/dL   Hgb urine dipstick NEGATIVE NEGATIVE   Bilirubin Urine NEGATIVE NEGATIVE   Ketones, ur NEGATIVE NEGATIVE mg/dL   Protein, ur NEGATIVE NEGATIVE mg/dL   Nitrite NEGATIVE NEGATIVE   Leukocytes, UA NEGATIVE NEGATIVE    Comment: MICROSCOPIC NOT DONE ON URINES WITH NEGATIVE PROTEIN, BLOOD, LEUKOCYTES, NITRITE, OR GLUCOSE <1000 mg/dL.  Troponin I     Status: Abnormal   Collection Time: 02/24/16  7:53 PM  Result Value Ref Range   Troponin I 0.18 (HH) <0.03 ng/mL    Comment: CRITICAL VALUE NOTED.  VALUE IS CONSISTENT WITH PREVIOUSLY REPORTED AND CALLED VALUE.  Troponin I     Status: Abnormal   Collection Time: 02/25/16  2:00 AM  Result Value Ref Range   Troponin I 0.19 (HH) <0.03 ng/mL    Comment: CRITICAL VALUE NOTED.  VALUE IS CONSISTENT WITH PREVIOUSLY REPORTED AND CALLED VALUE.  CBC  Status: Abnormal   Collection Time: 02/25/16  2:00 AM  Result Value Ref Range   WBC 7.6 4.0 - 10.5 K/uL   RBC 3.88 (L) 4.22 - 5.81 MIL/uL   Hemoglobin 12.4 (L) 13.0 - 17.0 g/dL   HCT 36.9 (L) 39.0 - 52.0 %   MCV 95.1 78.0 - 100.0 fL   MCH 32.0 26.0 - 34.0 pg   MCHC 33.6 30.0 - 36.0 g/dL   RDW 12.9 11.5 - 15.5 %   Platelets 208 150 - 400 K/uL  Basic metabolic panel     Status: Abnormal   Collection Time: 02/25/16  2:00 AM  Result Value Ref Range   Sodium 137 135 - 145 mmol/L   Potassium 3.8 3.5 - 5.1 mmol/L   Chloride 108 101 - 111 mmol/L   CO2 21 (L) 22 - 32 mmol/L   Glucose, Bld 90 65 - 99 mg/dL   BUN 17 6 - 20 mg/dL   Creatinine, Ser 0.85 0.61 - 1.24 mg/dL   Calcium 8.7 (L) 8.9 - 10.3 mg/dL   GFR calc non Af Amer >60 >60 mL/min    GFR calc Af Amer >60 >60 mL/min    Comment: (NOTE) The eGFR has been calculated using the CKD EPI equation. This calculation has not been validated in all clinical situations. eGFR's persistently <60 mL/min signify possible Chronic Kidney Disease.    Anion gap 8 5 - 15    Imaging: No results found.  Assessment:  1. Active Problems: 2.   S/P AVR 3.   Chronic atrial fibrillation ([place]) 4.   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   Benign essential HTN 6.   Hypotension 7.   Status post coronary artery stent placement 8.   Bradycardia 9.   Plan:  1. Epidsode sounds vagal in nature. Has not had a problem with hypotension or bradycardia overnight. Short 4 beat run of asymptomatic NSVT. Main complaint today is the food and the fact that he cannot add salt. Very nauseated, may be playing a role in vagal episode. Add pepcid 20 mg BID today. Restart Eliquis. Transfer to floor - possible D/C tomorrow if nausea improves.  Time Spent Directly with Patient:  15 minutes  Length of Stay:  LOS: 1 day   Pixie Casino, MD, [facility] Attending Cardiologist [place] 02/25/2016, 7:56 AM

## 2016-02-25 NOTE — Progress Notes (Signed)
Report called to receiving RN [phone]. Patient with no complaints at the current time. Will transfer on monitor.

## 2016-02-25 NOTE — Progress Notes (Signed)
Pt ambulated 348ft using RW, standby assist, with no complaints and no tele alarms.  To bed after walk with call bell, cell phone, and urinal in reach.  Will con't plan of care.

## 2016-02-26 LAB — URINE CULTURE

## 2016-02-26 NOTE — Progress Notes (Signed)
Utilization review completed.  

## 2016-02-26 NOTE — Progress Notes (Signed)
PT Cancellation Note  Patient Details Name: Jason Hawkins MRN: [id number] DOB: 1927-10-02   Cancelled Treatment:    Reason Eval/Treat Not Completed: Medical issues which prohibited therapy.  Will check later as time and pt allow.   Ramond Dial 02/26/2016, 9:32 AM    Mee Hives, PT MS Acute Rehab Dept. Number: [place]

## 2016-02-26 NOTE — Progress Notes (Signed)
Primary cardiologist: Dr. Jenkins Rouge  Seen for followup: Transient hypotension, AF, CAD  Subjective:    Reports no nausea this morning. Has not eaten yet. No chest pain or palpitations.  Objective:   Temp:  [97.5 F (36.4 C)-98.4 F (36.9 C)] 98.3 F (36.8 C) (07/23 0603) Pulse Rate:  [54-71] 54 (07/23 0603) Resp:  [16-18] 18 (07/23 0603) BP: (135-163)/(60-82) 163/82 (07/23 0603) SpO2:  [98 %-100 %] 99 % (07/23 0603) Weight:  [169 lb 9.6 oz (76.9 kg)] 169 lb 9.6 oz (76.9 kg) (07/23 0603) Last BM Date: 02/25/16  Filed Weights   02/24/16 1400 02/26/16 0603  Weight: 170 lb 3.1 oz (77.2 kg) 169 lb 9.6 oz (76.9 kg)    Intake/Output Summary (Last 24 hours) at 02/26/16 0901 Last data filed at 02/26/16 [id number]  Gross per 24 hour  Intake                0 ml  Output              750 ml  Net             -750 ml    Telemetry: Rate-controlled atrial fibrillation.  Exam:  General: Elderly male in no distress.  Lungs: Clear, nonlabored.  Cardiac: Irregularly irregular without gallop.  Abdomen: NABS, nontender.  Extremities: No pitting edema.  Lab Results:  Basic Metabolic Panel:  Recent Labs Lab 02/21/16 0724 02/24/16 1257 02/25/16 0200  NA 136 137 137  K 3.8 4.3 3.8  CL 106 107 108  CO2 24 20* 21*  GLUCOSE 99 110* 90  BUN 14 17 17   CREATININE 0.86 1.03 0.85  CALCIUM 8.9 8.8* 8.7*  MG  --  1.8  --     Liver Function Tests:  Recent Labs Lab 02/21/16 0724 02/24/16 1257  AST 23 28  ALT 28 34  ALKPHOS 73 73  BILITOT 1.0 1.1  PROT 6.2* 5.9*  ALBUMIN 3.2* 3.2*    CBC:  Recent Labs Lab 02/21/16 0724 02/24/16 1257 02/25/16 0200  WBC 9.7 8.5 7.6  HGB 12.1* 11.4* 12.4*  HCT 36.1* 34.4* 36.9*  MCV 93.8 94.2 95.1  PLT 334 247 208    Cardiac Enzymes:  Recent Labs Lab 02/24/16 1404 02/24/16 1953 02/25/16 0200  TROPONINI 0.16* 0.18* 0.19*    Echocardiogram 02/17/2016: Study Conclusions  - Left ventricle: The cavity size was normal.  Wall thickness was   normal. Systolic function was normal. The estimated ejection   fraction was in the range of 60% to 65%. - Aortic valve: A bioprosthesis was present. - Right atrium: The atrium was severely dilated.  ECG: Tracing from 02/26/2016 showed rate-controlled atrial fibrillation with leftward axis and PVC.   Medications:   Scheduled Medications: . apixaban  2.5 mg Oral BID  . atorvastatin  80 mg Oral q1800  . clopidogrel  75 mg Oral Daily  . famotidine  20 mg Oral BID  . multivitamin with minerals  1 tablet Oral Daily     Infusions: . sodium chloride 10 mL/hr at 02/25/16 0145     PRN Medications:  acetaminophen, flavoxATE, ondansetron (ZOFRAN) IV   Assessment:   1. Hypotension in the setting of nausea, possibly vasovagal and resolved at present. He did receive hydration. Placed on Pepcid yesterday as well. No obvious acute hemorrhage with stable hemoglobin, no obvious recurrent ACS. Heart rhythm has been stable.  2. Chronic atrial fibrillation, Eliquis resumed.  3. CAD status post inferior STEMI on July 13 status  post DES to the distal circumflex. On Plavix at this time. No chest pain. Follow-up troponin I levels do not suggest new ACS.  4. History of hypertension, taken off lisinopril. Blood pressure trending up.  5. History of SAH due to brain aneurysm status post coil embolization in June of this year.   Plan/Discussion:    Patient transfered out of unit yesterday. Stable at this time although has not eaten yet today. Hopefully will fare better on Pepcid. Eliquis has been resumed as per prior plan. Follow blood pressure trend and determine if he needs to go back on any additional antihypertensives. He is reluctant to do so in light of recent events. Watch today and increase activity.   Satira Sark, M.D., F.A.C.C.

## 2016-02-27 ENCOUNTER — Other Ambulatory Visit: Payer: Self-pay | Admitting: *Deleted

## 2016-02-27 DIAGNOSIS — I951 Orthostatic hypotension: Secondary | ICD-10-CM

## 2016-02-27 LAB — BASIC METABOLIC PANEL
Anion gap: 9 (ref 5–15)
BUN: 14 mg/dL (ref 6–20)
CHLORIDE: 106 mmol/L (ref 101–111)
CO2: 22 mmol/L (ref 22–32)
CREATININE: 0.76 mg/dL (ref 0.61–1.24)
Calcium: 9 mg/dL (ref 8.9–10.3)
GFR calc Af Amer: >60 mL/min (ref >60)
GFR calc non Af Amer: >60 mL/min (ref >60)
Glucose, Bld: 99 mg/dL (ref 65–99)
Potassium: 4 mmol/L (ref 3.5–5.1)
SODIUM: 137 mmol/L (ref 135–145)

## 2016-02-27 LAB — CBC
HCT: 37.9 % — ABNORMAL LOW (ref 39.0–52.0)
HEMOGLOBIN: 12.6 g/dL — AB (ref 13.0–17.0)
MCH: 31.3 pg (ref 26.0–34.0)
MCHC: 33.2 g/dL (ref 30.0–36.0)
MCV: 94 fL (ref 78.0–100.0)
Platelets: 197 10*3/uL (ref 150–400)
RBC: 4.03 MIL/uL — ABNORMAL LOW (ref 4.22–5.81)
RDW: 12.8 % (ref 11.5–15.5)
WBC: 8.6 10*3/uL (ref 4.0–10.5)

## 2016-02-27 MED ORDER — SODIUM CHLORIDE 0.9 % IV BOLUS (SEPSIS)
250.0000 mL | Freq: Once | INTRAVENOUS | Status: AC
  Administered 2016-02-27: 250 mL via INTRAVENOUS

## 2016-02-27 MED ORDER — LISINOPRIL 2.5 MG PO TABS
2.5000 mg | ORAL_TABLET | Freq: Every day | ORAL | Status: DC
  Administered 2016-02-27: 2.5 mg via ORAL
  Filled 2016-02-27: qty 1

## 2016-02-27 NOTE — Consult Note (Signed)
   [facility] CM Inpatient Consult   02/27/2016  Jason Hawkins 09-24-1927 [id number]    Patient screened for long-term disease management services with [place] Management program. Martin Majestic to bedside to discuss and offer [place] Management services. [name] was agreeable and written consent signed. Explained to patient that he will receive post hospital transition of care calls and will be evaluated for monthly home visits. Confirmed Primary Care MD as Dr. Alain Marion.  Confirmed best contact number as [phone]. Explained that [place] Management will not interfere or replace services provided by home health. [name] reports he lives alone. Denies having issues with obtaining medications or with transportation. Endorses that he will be appreciative of follow up for post discharge instructions and going over medications.  Left [facility] Care Management packet and contact information at bedside. Will make inpatient RNCM aware that patient will be followed by [facility] Management post hospital discharge.  Will request for him to be assigned to [facility] for transition of care. [name] has had x 5 admissions and has history of STEMI, CAD, HTN, AFIB. He has also had issues with hypotension while hospitalized.    Marthenia Rolling, MSN-Ed, RN,BSN [facility] Liaison [phone]

## 2016-02-27 NOTE — Care Management Note (Signed)
Case Management Note Marvetta Gibbons RN, BSN Unit 2W-Case Manager [phone]  Patient Details  Name: Jason Hawkins MRN: [id number] Date of Birth: [birth year]  Subjective/Objective:   Pt admitted with hypotension from CIR                 Action/Plan: PTA pt lived at home- was at [facility] for rehab after several hospital stays- PT eval recommending [place]- order has been placed for HHPT and RW- spoke with pt at bedside to offer choice for [facility] agency in [facility]- pt would like to look over list prior to making choice- CM will check in with pt in am regarding choice-   Expected Discharge Date:                  Expected Discharge Plan:  [place]  In-House Referral:     Discharge planning Services  CM Consult  Post Acute Care Choice:  [place], Durable Medical Equipment Choice offered to:  Patient  DME Arranged:  [place] [facility] DME Agency:  [place]:  PT [facility] Agency:     Status of Service:  Completed, signed off  If discussed at [place] of Stay Meetings, dates discussed:    Additional Comments:  Dawayne Patricia, RN 02/27/2016, 4:32 PM

## 2016-02-27 NOTE — Evaluation (Signed)
Physical Therapy Evaluation Patient Details Name: Jason Hawkins MRN: [id number] DOB: 1927-08-30 Today's Date: 02/27/2016   History of Present Illness  82 y/o M with history of chronic a-fib, AS s/p biological valve replacement, HTN HLD, recent admission for [facility] (due to brain aneurysm, s/p coil embolization [id number]) complicated by STEMI/bradycardia while on rehab whom we are asked to see urgently due to hypotension. To recap recent hx, he was admitted in June 2017 for [facility] s/p coiling as above. He was discharged to inpatient rehab and was started on Eliquis (previously on Coumadin). While on rehab on 02/16/16 he began to feel weak with pre-syncope and nausea. Rapid response was called to the bedside and noted to be bradycardic and hypotensive. He was pale, cool and clammy. Bp noted 71/37. Stat EKG done which showed a junctional rhythm at a rate of 30 - initially EP consult was called for urgent pacemaker, but repeat EKG showed acute inferior MI so he was taken emergently to the cath lab. LHC 02/16/16: 100% distal Cx s/p DES; 30% prox-mid RCA, 30%, prox-mid LAD, 80% OM1. 2D Echo 02/17/16: EF 60-65%, severely dilated RA. Post-PCI course was fairly uncomplicated aside from UTI. He was discharged again to inpatient rehab on 02/20/16 on Eliquis and Plavix.He has been progressing well at therapy and his anticipated discharge date was tomorrow. Today he was working with PT when he suddenly felt weak and went pale. He has also had some nausea as well. PT checked his BP automatically and it was 63/45. They called for his nurse and subsequent BPs were 80/59, 85/45. HR by initial EKG was 43 (atrial fib), coming up to the 60s without intervention. He has not had any chest pain or dyspnea. Pulse ox 95% RA. No chest pain, dyspnea. CBG 114. Labs pending. 500cc saline bolus running in. We have written for him to be transferred for admission into 2H. He has had chronic nausea since initial admission. No abdominal pain, GI bleeding,  diarrhea, constipation, or fevers.  Clinical Impression  Pt admitted with above diagnosis. Pt currently with functional limitations due to the deficits listed below (see PT Problem List). Pt was able to ambulate in the halls with RW with steady gait.  Pt feels comfortable going home with family and friends assist for first few weeks. Can also arrange some [place] f/u.  Should be ready to d/c soon.   Pt will benefit from skilled PT to increase their independence and safety with mobility to allow discharge to the venue listed below.      Follow Up Recommendations Home health PT;Supervision/Assistance - 24 hour    Equipment Recommendations  Rolling walker with 5" wheels (pt to get cane at drug store as he wants both RW and cane)    Recommendations for Other Services       Precautions / Restrictions Precautions Precautions: Fall Restrictions Weight Bearing Restrictions: No      Mobility  Bed Mobility Overal bed mobility: Independent                Transfers Overall transfer level: Needs assistance Equipment used: Rolling walker (2 wheeled) Transfers: Sit to/from Stand Sit to Stand: Supervision            Ambulation/Gait Ambulation/Gait assistance: Supervision Ambulation Distance (Feet): 450 Feet Assistive device: Rolling walker (2 wheeled) Gait Pattern/deviations: Step-through pattern;Decreased stride length   Gait velocity interpretation: <1.8 ft/sec, indicative of risk for recurrent falls General Gait Details: Pt very safe with RW.  No LOB with min challenges to  balance.   Stairs            Wheelchair Mobility    Modified Rankin (Stroke Patients Only) Modified Rankin (Stroke Patients Only) Pre-Morbid Rankin Score: No symptoms Modified Rankin: Moderately severe disability     Balance Overall balance assessment: Needs assistance Sitting-balance support: No upper extremity supported;Feet supported Sitting balance-Leahy Scale: Good     Standing balance  support: During functional activity;No upper extremity supported Standing balance-Leahy Scale: Fair Standing balance comment: can stand statically without UE support and balance self.               High level balance activites: Direction changes;Turns;Sudden stops High Level Balance Comments: supervision for above challenges with RW use.              Pertinent Vitals/Pain Pain Assessment: No/denies pain  VSS    Home Living Family/patient expects to be discharged to:: Private residence Living Arrangements: Alone Available Help at Discharge: Family;Friend(s);Available PRN/intermittently Type of Home: House Home Access: Stairs to enter Entrance Stairs-Rails: Right Entrance Stairs-Number of Steps: 5 Home Layout: One level Home Equipment: Grab bars - tub/shower;Shower seat - built in      Prior Function Level of Independence: Independent               Hand Dominance   Dominant Hand: Right    Extremity/Trunk Assessment   Upper Extremity Assessment: Defer to OT evaluation           Lower Extremity Assessment: Overall WFL for tasks assessed      Cervical / Trunk Assessment: Normal  Communication   Communication: HOH  Cognition Arousal/Alertness: Awake/alert Behavior During Therapy: WFL for tasks assessed/performed Overall Cognitive Status: Within Functional Limits for tasks assessed                      General Comments      Exercises        Assessment/Plan    PT Assessment Patient needs continued PT services  PT Diagnosis Difficulty walking   PT Problem List Decreased strength;Decreased activity tolerance;Decreased balance;Decreased mobility;Decreased coordination;Decreased safety awareness;Decreased range of motion  PT Treatment Interventions DME instruction;Gait training;Stair training;Functional mobility training;Therapeutic activities;Therapeutic exercise;Balance training;Patient/family education;Cognitive remediation   PT Goals  (Current goals can be found in the Care Plan section) Acute Rehab PT Goals Patient Stated Goal: to go home PT Goal Formulation: With patient Time For Goal Achievement: 03/12/16 Potential to Achieve Goals: Good    Frequency Min 3X/week   Barriers to discharge Decreased caregiver support pt states his son is staying through [place], then friends for a week, then his youngest son lives close by    [employer]               End of Session Equipment Utilized During Treatment: Gait belt Activity Tolerance: Patient tolerated treatment well Patient left: in chair;with call bell/phone within reach Nurse Communication: Mobility status         Time: [id number] PT Time Calculation (min) (ACUTE ONLY): 27 min   Charges:   PT Evaluation $PT Eval Moderate Complexity: 1 Procedure PT Treatments $Gait Training: 8-22 mins   PT G CodesDenice Paradise March 03, 2016, 3:59 PM [employer] Acute Rehabilitation [phone] (pager)

## 2016-02-27 NOTE — Progress Notes (Signed)
       Patient Name: Jason Hawkins Date of Encounter: 02/27/2016    SUBJECTIVE: He feels better today. A son is with him. He denies nausea.  TELEMETRY:  Atrial fibrillation with controlled ventricular response in the 60-65 bpm minute range. Vitals:   02/26/16 2037 02/27/16 0543 02/27/16 0633 02/27/16 0846  BP: 140/60 (!) 181/75 (!) 163/92 (!) 159/83  Pulse: 71 71 64   Resp: 20 20    Temp: 98.8 F (37.1 C) 97.8 F (36.6 C)    TempSrc: Oral Oral    SpO2: 98% 96%    Weight:  169 lb 5 oz (76.8 kg)    Height:        Intake/Output Summary (Last 24 hours) at 02/27/16 1035 Last data filed at 02/27/16 0800  Gross per 24 hour  Intake              480 ml  Output             1550 ml  Net            -1070 ml   LABS: Basic Metabolic Panel:  Recent Labs  02/24/16 1257 02/25/16 0200 02/27/16 0339  NA 137 137 137  K 4.3 3.8 4.0  CL 107 108 106  CO2 20* 21* 22  GLUCOSE 110* 90 99  BUN 17 17 14   CREATININE 1.03 0.85 0.76  CALCIUM 8.8* 8.7* 9.0  MG 1.8  --   --    CBC:  Recent Labs  02/24/16 1257 02/25/16 0200 02/27/16 0339  WBC 8.5 7.6 8.6  NEUTROABS 6.1  --   --   HGB 11.4* 12.4* 12.6*  HCT 34.4* 36.9* 37.9*  MCV 94.2 95.1 94.0  PLT 247 208 197   Cardiac Enzymes:  Recent Labs  02/24/16 1404 02/24/16 1953 02/25/16 0200  TROPONINI 0.16* 0.18* 0.19*   Radiology/Studies:  No new data  Physical Exam: Blood pressure (!) 159/83, pulse 64, temperature 97.8 F (36.6 C), temperature source Oral, resp. rate 20, height 5' 9.5" (1.765 m), weight 169 lb 5 oz (76.8 kg), SpO2 96 %. Weight change: -4.6 oz (-0.13 kg)  Wt Readings from Last 3 Encounters:  02/27/16 169 lb 5 oz (76.8 kg)  02/24/16 170 lb 4.8 oz (77.2 kg)  02/20/16 171 lb 3.2 oz (77.7 kg)   Lungs are clear Cardiac exam reveals an irregularly irregular rhythm. A soft 1 to 2/6 systolic murmurs heard. Extremities reveal no edema.  ASSESSMENT:  1. Bradycardia and hypotension possibly vagal in origin.  This problem seems to have resolved. 2. Elevated blood pressure. He is not currently on his typical antihypertensive regimen which is low-dose ACE inhibitor therapy. 3. Status post inferior STEMI earlier this month. 4. Status post calling of cerebral aneurysm earlier this month.  Plan:  1. Reengage physical therapy 2. Remove bed rest designation. Patient needs ambulate. 3. Will get case management and physical therapy involved and plan discharge later today or in a.m. depending upon the recommendations.  Signed, Belva Crome III 02/27/2016, 10:35 AM

## 2016-02-27 NOTE — Progress Notes (Signed)
BP of 146/55 @ 1605 and lisinopril is given. BP of 88/69 @ 1651. Will give bolus of 250cc NS.

## 2016-02-28 ENCOUNTER — Encounter (HOSPITAL_COMMUNITY): Payer: Self-pay | Admitting: Physician Assistant

## 2016-02-28 ENCOUNTER — Telehealth: Payer: Self-pay | Admitting: Cardiovascular Disease

## 2016-02-28 DIAGNOSIS — L8990 Pressure ulcer of unspecified site, unspecified stage: Secondary | ICD-10-CM | POA: Insufficient documentation

## 2016-02-28 MED ORDER — FAMOTIDINE 20 MG PO TABS: 20.0000 mg | ORAL_TABLET | Freq: Two times a day (BID) | ORAL | 6 refills | Status: DC

## 2016-02-28 MED ORDER — FLAVOXATE HCL 100 MG PO TABS: 100.0000 mg | ORAL_TABLET | Freq: Three times a day (TID) | ORAL | 0 refills | Status: DC | PRN

## 2016-02-28 NOTE — Telephone Encounter (Signed)
New message    [facility] appt on 03/09/16 with Truitt Merle per Bonney Leitz.

## 2016-02-28 NOTE — Discharge Summary (Signed)
Discharge Summary    Patient ID: Jason Hawkins,  MRN: [id number], DOB/AGE: 04/18/1928 82 y.o.  Admit date: 02/24/2016 Discharge date: 02/28/2016  Primary Care Provider: Walker Kehr Primary Cardiologist: Dr. 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 Bee Venom Anaphylaxis  . Codeine Nausea Only     History of Present Illness     Jason Hawkins is a 82 y.o. male with a history of chronic a-fib on Eliquis, AS s/p biological valve replacement, HTN, HLD, recent admission for [facility] (due to brain aneurysm, s/p coil embolization [id number]) complicated by STEMI/bradycardia while in cardiac inpatient rehab who was admitted from cardiac rehab on 02/24/16 for hypotension.    To recap recent hx, he was admitted in June 2017 for [facility] s/p coiling as above. He was discharged to inpatient rehab and was started on Eliquis (previously on Coumadin). While on rehab on 02/16/16 he began to feel weak with pre-syncope and nausea. Rapid response was called to the bedside and noted to be bradycardic and hypotensive. He was pale, cool and clammy. Bp noted 71/37. Stat EKG done which showed a junctional rhythm at a rate of 30 - initially EP consult was called for urgent pacemaker, but repeat EKG showed acute inferior MI so he was taken emergently to the cath lab. LHC 02/16/16: 100% distal Cx s/p DES; 30% prox-mid RCA, 30%, prox-mid LAD, 80% OM1. 2D Echo 02/17/16: EF 60-65%, severely dilated RA. Post-PCI course was fairly uncomplicated aside from UTI. He was discharged again to inpatient rehab on 02/20/16 on Eliquis and Plavix.  He was progressing well at therapy and his anticipated  discharge date was 02/25/16. However, on 02/24/16 while working wtih PT, he suddenly felt weak and went pale. He has also had some nausea as well. PT checked his BP automatically and it was 63/45. They called for his nurse and subsequent BPs were 80/59, 85/45. HR by initial EKG was 43 (atrial fib), coming up to the 60s without intervention. He denied chest pain or dyspnea. Pulse ox 95% RA. CBG 114. Troponin remained low and flat (down trending from previous STEMI s/p DES to dLCx).    Hospital Course     Consultants: none  He was admitted to [facility]. Lisinopril was held and he was hydrated with normal saline. Eliquis was initially held but then resumed. He continued to have nausea and was started on pepcid. His hypotension was felt to be vasovagal and related to volume depletion. His BP did start to creep up and he was restarted on low dose Lisinopril. This caused a significant drop in BP (down to 88/69) and was discontinued again. Plan will be to continue without antihypertensive therapy for now. He will be discharged home with [facility] PT and RW. Will plan to have him keep a BP log and follow up in the office in 1-2 weeks. If BP significantly elevated, we will have to add a antihypertensive cautiously. Would avoid lisinopril and BBs ( given reaction to lisinopril and bradycardia).   The patient has had an uncomplicated hospital course and is recovering well. He has been seen by Dr. Johnsie Cancel today and deemed ready for discharge home. All  follow-up appointments have been scheduled. Discharge medications are listed below.  _____________  Discharge Vitals Blood pressure (!) 146/79, pulse 79, temperature 97.8 F (36.6 C), temperature source Oral, resp. rate 20, height 5' 9.5" (1.765 m), weight 171 lb 1.6 oz (77.6 kg), SpO2 99 %.  Filed Weights   02/26/16 0603 02/27/16 0543 02/28/16 0410  Weight: 169 lb 9.6 oz (76.9 kg) 169 lb 5 oz (76.8 kg) 171 lb 1.6 oz (77.6 kg)    Labs & Radiologic Studies      CBC  Recent Labs  02/27/16 0339  WBC 8.6  HGB 12.6*  HCT 37.9*  MCV 94.0  PLT [id number]   Basic Metabolic Panel  Recent Labs  02/27/16 0339  NA 137  K 4.0  CL 106  CO2 22  GLUCOSE 99  BUN 14  CREATININE 0.76  CALCIUM 9.0    Ct Angio Head W Or Wo Contrast  Result Date: 01/31/2016 CLINICAL DATA:  Subarachnoid hemorrhage. No known trauma. On Coumadin for aortic valve replacement. EXAM: CT ANGIOGRAPHY HEAD AND NECK TECHNIQUE: Multidetector CT imaging of the head and neck was performed using the standard protocol during bolus administration of intravenous contrast. Multiplanar CT image reconstructions and MIPs were obtained to evaluate the vascular anatomy. Carotid stenosis measurements (when applicable) are obtained utilizing NASCET criteria, using the distal internal carotid diameter as the denominator. CONTRAST:  50 mL Isovue 370 IV COMPARISON:  CT head 01/31/2016 FINDINGS: CTA NECK Aortic arch: Atherosclerotic calcification aortic arch without aneurysm or dissection. Atherosclerotic calcification proximal great vessels which are widely patent. Lung apices clear. Right carotid system: Right common carotid artery widely patent. Mild atherosclerotic calcification in the right carotid bifurcation without significant stenosis. Left carotid system: Left common carotid artery widely patent. Atherosclerotic calcification of the carotid bifurcation without significant stenosis. Vertebral arteries:Both vertebral arteries are patent to the basilar without significant vertebral stenosis. Skeleton: Moderately severe cervical spondylosis. No fracture or mass lesion. Other neck: Negative for mass or adenopathy in the neck. CTA HEAD Anterior circulation: Mild atherosclerotic calcification in the cavernous carotid artery bilaterally. No aneurysm in the cavernous carotid. Anterior and middle cerebral arteries patent bilaterally. 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 This is  presumably the cause of the symmetric subarachnoid hemorrhage. Posterior circulation: Both vertebral arteries patent to the basilar. PICA patent. Basilar widely patent. Superior cerebellar and posterior cerebral arteries patent bilaterally. 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 This is presumably the cause of acute subarachnoid hemorrhage. No other aneurysm. Mild atherosclerotic disease the carotid bifurcation bilaterally without significant carotid stenosis. No significant vertebral stenosis. These results were called by telephone at the time of interpretation on 01/31/2016 at 10:12 pm to Dr. Wallie Char , who verbally acknowledged these results. Electronically Signed   By: Franchot Gallo M.D.   On: 01/31/2016 22:14   Ct Head Wo Contrast  Result Date: 02/03/2016 CLINICAL DATA:  Continued surveillance subarachnoid hemorrhage. EXAM: CT HEAD WITHOUT CONTRAST TECHNIQUE: Contiguous axial images were obtained from the base of the skull through the vertex without intravenous contrast. COMPARISON:  Multiple priors. FINDINGS: The patient has undergone endovascular treatment with coil embolization of an anterior communicating artery aneurysm. Coil mass appears compact without migration. Resolution of previously noted blood in the basilar and interhemispheric cisterns. Slight layering hemorrhage in the occipital horns of the lateral ventricles but no intervening hydrocephalus, and no new  subarachnoid blood. No visible cerebral infarction due to vasospasm. Mild sinus fluid predominantly sphenoid, likely due to recumbency. No mastoid fluid. Calvarium intact. Negative orbits. IMPRESSION: Satisfactory appearance status post coiling of an ACom aneurysm. No postprocedural complications.  No intervening hydrocephalus. Electronically Signed   By: Staci Righter M.D.   On: 02/03/2016 11:16   Ct Head Wo  Contrast  Result Date: 01/31/2016 CLINICAL DATA:  Sudden onset of headaches EXAM: CT HEAD WITHOUT CONTRAST TECHNIQUE: Contiguous axial images were obtained from the base of the skull through the vertex without intravenous contrast. COMPARISON:  None. FINDINGS: Bony calvarium is intact. There are changes consistent with subarachnoid hemorrhage along the distribution of the middle cerebral arteries and anterior cerebral arteries bilaterally. Given the patient's clinical history these changes are consistent with ruptured aneurysm. No intraventricular hemorrhage is noted. The basilar cisterns are not filled with blood. No mass lesion is noted. No acute infarct is seen. Mild atrophic changes are noted. IMPRESSION: Subarachnoid hemorrhage as described above. A definitive aneurysm is not noted on this exam. Critical Value/emergent results were called by telephone at the time of interpretation on 01/31/2016 at 9:28 pm to Dr. Nicole Kindred, who verbally acknowledged these results. Electronically Signed   By: Inez Catalina M.D.   On: 01/31/2016 21:30   Ct Angio Neck W Or Wo Contrast  Result Date: 01/31/2016 CLINICAL DATA:  Subarachnoid hemorrhage. No known trauma. On Coumadin for aortic valve replacement. EXAM: CT ANGIOGRAPHY HEAD AND NECK TECHNIQUE: Multidetector CT imaging of the head and neck was performed using the standard protocol during bolus administration of intravenous contrast. Multiplanar CT image reconstructions and MIPs were obtained to evaluate the vascular anatomy. Carotid stenosis measurements (when applicable) are obtained utilizing NASCET criteria, using the distal internal carotid diameter as the denominator. CONTRAST:  50 mL Isovue 370 IV COMPARISON:  CT head 01/31/2016 FINDINGS: CTA NECK Aortic arch: Atherosclerotic calcification aortic arch without aneurysm or dissection. Atherosclerotic calcification proximal great vessels which are widely patent. Lung apices clear. Right carotid system: Right common  carotid artery widely patent. Mild atherosclerotic calcification in the right carotid bifurcation without significant stenosis. Left carotid system: Left common carotid artery widely patent. Atherosclerotic calcification of the carotid bifurcation without significant stenosis. Vertebral arteries:Both vertebral arteries are patent to the basilar without significant vertebral stenosis. Skeleton: Moderately severe cervical spondylosis. No fracture or mass lesion. Other neck: Negative for mass or adenopathy in the neck. CTA HEAD Anterior circulation: Mild atherosclerotic calcification in the cavernous carotid artery bilaterally. No aneurysm in the cavernous carotid. Anterior and middle cerebral arteries patent bilaterally. 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 This is presumably the cause of the symmetric subarachnoid hemorrhage. Posterior circulation: Both vertebral arteries patent to the basilar. PICA patent. Basilar widely patent. Superior cerebellar and posterior cerebral arteries patent bilaterally. 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 This is presumably the cause of acute subarachnoid hemorrhage. No other aneurysm. Mild atherosclerotic disease the carotid bifurcation bilaterally without significant carotid stenosis. No significant vertebral stenosis. These results were called by telephone at the time of interpretation on 01/31/2016 at 10:12 pm to Dr. Wallie Char , who verbally acknowledged these results. Electronically Signed   By: Franchot Gallo M.D.   On: 01/31/2016 22:14   Dg Chest Port 1 View  Result Date: 02/08/2016 CLINICAL DATA:  Dyspnea and lethargy for 1 day, history hypertension, atrial fibrillation,  aortic stenosis post AVR, prostate cancer EXAM: PORTABLE CHEST 1 VIEW COMPARISON:  Portable exam 1953 hours compared to 02/05/2016 FINDINGS:  Enlargement of cardiac silhouette post median sternotomy and AVR. Atherosclerotic calcification aortic arch. Mediastinal contours and pulmonary vascularity normal. Bibasilar atelectasis with improved aeration at LEFT base since previous exam. Tiny LEFT pleural effusion. Improved RIGHT upper lobe infiltrate. No pneumothorax. Degenerative changes RIGHT AC joint. IMPRESSION: Bibasilar atelectasis and tiny LEFT pleural effusion. Improved RIGHT upper lobe infiltrate. Improved aeration since previous exam. Enlargement of cardiac silhouette post AVR. Electronically Signed   By: Lavonia Dana M.D.   On: 02/08/2016 20:04   Dg Chest Port 1 View  Result Date: 02/05/2016 CLINICAL DATA:  Acute respiratory failure Fever of 100 this morning O2 at 96% EXAM: PORTABLE CHEST - 1 VIEW COMPARISON:  the previous day's study FINDINGS: Slight improvement in the focal right upper and lower lobe airspace disease. Left retrocardiac consolidation/atelectasis persists. Heart size upper limits normal.  Previous CABG and AVR. Small pleural effusions persist. Degenerative changes in the right shoulder. IMPRESSION: 1. Slight improvement in right lung airspace disease. 2. Small effusions with persistent left retrocardiac consolidation/atelectasis. Electronically Signed   By: Lucrezia Europe M.D.   On: 02/05/2016 08:40   Dg Chest Port 1 View  Result Date: 02/04/2016 CLINICAL DATA:  Shortness of Breath EXAM: PORTABLE CHEST 1 VIEW COMPARISON:  10/19/2003 FINDINGS: Cardiac shadow is mildly enlarged. Postsurgical changes are again seen. Diffuse patchy infiltrates are noted throughout the right lung as well as to a lesser degree in the left lung base. No bony abnormality is noted. IMPRESSION: Bilateral airspace opacities right greater than left. Followup examination is recommended. Electronically Signed   By: Inez Catalina M.D.   On: 02/04/2016 10:11     Diagnostic Studies/Procedures    2D ECHO: 02/17/2016: Study Conclusions - Left ventricle: The  cavity size was normal. Wall thickness was normal. Systolic function was normal. The estimated ejection fraction was in the range of 60% to 65%. - Aortic valve: A bioprosthesis was present. - Right atrium: The atrium was severely dilated. _____________    Disposition   Pt is being discharged home today in good condition.  Follow-up Plans & Appointments    Follow-up Information    Truitt Merle, NP Follow up on 03/09/2016.   Specialties:  Nurse Practitioner, Interventional Cardiology, Cardiology, Radiology Why:  @ 10am Contact information: [place]. [address] [place] [address] [phone]          Discharge Instructions    AMB Referral to [place] Management    Complete by:  As directed   Please assign to [place] for transition of care. Has had x5 admits. Written consent obtained. History of STEMI, HTN, AFIB. Please call with questions. Marthenia Rolling, [place], RN,BSN-[facility] [phone]   Reason for consult:  Please assign to [place]   Expected date of contact:  1-3 days (reserved for hospital discharges)      Discharge Medications     Medication List    STOP taking these medications   lisinopril 2.5 MG tablet Commonly known as:  PRINIVIL,ZESTRIL     TAKE these medications   apixaban 2.5 MG Tabs tablet Commonly known as:  ELIQUIS Take 1 tablet (2.5 mg total) by mouth 2 (two) times daily.   atorvastatin 80 MG tablet Commonly known as:  LIPITOR Take 1 tablet (80 mg total) by mouth daily at 6 PM.   clopidogrel 75 MG tablet Commonly known  as:  PLAVIX Take 1 tablet (75 mg total) by mouth daily with breakfast.   famotidine 20 MG tablet Commonly known as:  PEPCID Take 1 tablet (20 mg total) by mouth 2 (two) times daily.   flavoxATE 100 MG tablet Commonly known as:  URISPAS Take 1 tablet (100 mg total) by mouth 3 (three) times daily as needed for bladder spasms.   multivitamin capsule Take 1 capsule by  mouth daily.       Aspirin prescribed at discharge?  No: on plavix and eliquis High Intensity Statin Prescribed? (Lipitor 40-80mg  or Crestor 20-40mg ): Yes Beta Blocker Prescribed? No: hx of bradycardia and hypotension For EF 45% or less, Was ACEI/ARB Prescribed? No: intolerant due to symptomatic hypotension ADP Receptor Inhibitor Prescribed? (i.e. Plavix etc.-Includes Medically Managed Patients): Yes For EF <45%, Aldosterone Inhibitor Prescribed? No: EF normal Was EF assessed during THIS hospitalization? No: recent echo done Was Cardiac Rehab II ordered? (Included Medically managed Patients): Yes   Outstanding Labs/Studies   None.   Duration of Discharge Encounter   Greater than 30 minutes including physician time.  Signed, Angelena Form PA-C 02/28/2016, 9:36 AM The patient has been seen in conjunction with Nell Range, PAC. All aspects of care have been considered and discussed. The patient has been personally interviewed, examined, and all clinical data has been reviewed.   Recurring hypotension related to volume contraction and possible superimposed vasovagal component.  He has been unable to tolerate reinstitution of low-dose ACE inhibitor therapy. At the time of discharge this medication will be withheld and can be considered for reintroduction at some point in the future depending upon his clinical status.  Arrangements for home health PT and a roller walker have been made.  Clinical follow-up with his primary cardiologist, Dr. Johnsie Cancel after a transition of care visit in one week.

## 2016-02-28 NOTE — Care Management Important Message (Signed)
Important Message  Patient Details  Name: Jason Hawkins MRN: [id number] Date of Birth: 06/21/1928   Medicare Important Message Given:  Yes    Nathen May 02/28/2016, 10:36 AM

## 2016-02-28 NOTE — Progress Notes (Signed)
Discussed with the patient and all questioned fully answered. He will call me if any problems arise.  Telemetry removed, CCMD notified. Pt assisted into shower chair safely.  Pt educated with support system member at bedside. Pt educated on follow up appointments, medications. Pt demonstrates great understanding of medications and recovery process.  Fritz Pickerel, RN

## 2016-02-28 NOTE — Care Management Note (Signed)
Case Management Note Marvetta Gibbons RN, BSN Unit 2W-Case Manager [phone]  Patient Details  Name: Jason Hawkins MRN: [id number] Date of Birth: 05/13/1928  Subjective/Objective:   Pt admitted with hypotension from CIR                 Action/Plan: PTA pt lived at home- was at [facility] for rehab after several hospital stays- PT eval recommending [place]- order has been placed for HHPT and RW- spoke with pt at bedside to offer choice for [facility] agency in [facility]- pt would like to look over list prior to making choice- CM will check in with pt in am regarding choice-   Expected Discharge Date:     02/28/16             Expected Discharge Plan:  [place]  In-House Referral:     Discharge planning Services  CM Consult  Post Acute Care Choice:  [place], Durable Medical Equipment Choice offered to:  Patient  DME Arranged:  Walker rolling with seat DME Agency:  [place]:  PT [facility] Agency:  [place]  Status of Service:  Completed, signed off  If discussed at [place] of Stay Meetings, dates discussed:    Additional Comments:  02/28/16- 1020- Mauriah Mcmillen RN, CM- f/u done with pt regarding choice for [facility] agency - per pt he would like to use [facility] for [facility] services- referral called to Manuela Schwartz with [facility] for HHPT- pt also would like RW with seat instead of standard walker- notified Jermaine with [facility] of DME need- rollator to be delivered to room prior to discharge.   Dawayne Patricia, RN 02/28/2016, 10:21 AM

## 2016-02-28 NOTE — Progress Notes (Signed)
       Patient Name: Jason Hawkins Date of Encounter: 02/28/2016    SUBJECTIVE: Doing well this morning. Note relative intolerance to lisinopril with blood pressure drop after administration yesterday. For the time being we will plan to do without antihypertensive therapy. Telemetry be necessary as outpatient.  TELEMETRY:  Chronic atrial fibrillation rate controlled Vitals:   02/27/16 1605 02/27/16 1651 02/27/16 2038 02/28/16 0410  BP: (!) 146/55 (!) 88/69 (!) 144/57 (!) 146/79  Pulse:  83 69 79  Resp:   (!) 24 20  Temp:   98 F (36.7 C) 97.8 F (36.6 C)  TempSrc:   Oral Oral  SpO2:   99% 99%  Weight:    171 lb 1.6 oz (77.6 kg)  Height:        Intake/Output Summary (Last 24 hours) at 02/28/16 0730 Last data filed at 02/28/16 0410  Gross per 24 hour  Intake              480 ml  Output              926 ml  Net             -446 ml   LABS: Basic Metabolic Panel:  Recent Labs  02/27/16 0339  NA 137  K 4.0  CL 106  CO2 22  GLUCOSE 99  BUN 14  CREATININE 0.76  CALCIUM 9.0   CBC:  Recent Labs  02/27/16 0339  WBC 8.6  HGB 12.6*  HCT 37.9*  MCV 94.0  PLT 197     Radiology/Studies:  No new data  Physical Exam: Blood pressure (!) 146/79, pulse 79, temperature 97.8 F (36.6 C), temperature source Oral, resp. rate 20, height 5' 9.5" (1.765 m), weight 171 lb 1.6 oz (77.6 kg), SpO2 99 %. Weight change: 1 lb 12.6 oz (0.81 kg)  Wt Readings from Last 3 Encounters:  02/28/16 171 lb 1.6 oz (77.6 kg)  02/24/16 170 lb 4.8 oz (77.2 kg)  02/20/16 171 lb 3.2 oz (77.7 kg)    Relatively flat neck veins Clear lung fields No edema  ASSESSMENT:  1. Hypotension possibly related to volume depletion and vagal reaction on admission. Recurrent hypotension with administration of lisinopril yesterday. Plan to do without antihypertensive therapy for the time being. 2. Frailty has improved. He ambulated well yesterday and looks 4 to discharge. 3. History of hypertension will  need to be monitored as an outpatient and therapy restarted when indicated based upon blood pressure. 4. Chronic atrial fibrillation 5. Status post recent inferolateral infarction treated with stent. 6. Anticoagulation/dual antiplatelet therapy for embolic stroke prevention and anti-stent thrombosis therapy. 7. Prior aortic valve replacement  Plan:  Plan discharge today Would not resume lisinopril at this time. [facility] out reach Home PT/RW Discharge today  Signed, Belva Crome III 02/28/2016, 7:30 AM

## 2016-02-29 NOTE — Telephone Encounter (Signed)
Left message for patient to call back  

## 2016-02-29 NOTE — Discharge Summary (Signed)
  Physician Discharge Summary  Patient ID: Jason Hawkins MRN: [id number] DOB/AGE: 09-12-1927 82 y.o.  Admit date: 01/31/2016 Discharge date: 02/29/2016  Admission Diagnoses: Subarachnoid Hemorrhage  Discharge Diagnoses: Same Active Problems:   S/P AVR   Ruptured cerebral aneurysm (HCC)   Benign essential HTN   Vascular headache   Discharged Condition: Stable  Hospital Course:  Mrs. Jason Hawkins is a 82 y.o. male admitted with sudden severe headache. CTA demonstrated a small Acom aneurysm. He underwent diagnostic angiogram and successful coiling. He had a largely unremarkable ICU course without clinical vasospasm. He did have headaches improved with dilaudid. He was seen by PT/OT and PMR and found to be a good CIR candidate. He did have a history of Afib and prior biological valve replacement and was on coumadin PTA. He was started on Eliquis on the day of transfer to CIR.  Treatments: Surgery - Coiling of Acom aneurysm  Discharge Exam: Blood pressure (!) 160/69, pulse 65, temperature 97.9 F (36.6 C), temperature source Oral, resp. rate 20, height 5\' 9"  (1.753 m), weight 84.4 kg (186 lb), SpO2 98 %. Awake, alert, oriented Speech fluent, appropriate CN grossly intact 5/5 BUE/BLE Wound c/d/i  Follow-up: Follow-up in my office [facility] [phone]) in 4-6 weeks  Disposition: CIR     Medication List    ASK your doctor about these medications   multivitamin capsule Take 1 capsule by mouth daily.        SignedConsuella Lose, C 02/29/2016, 6:04 PM

## 2016-03-01 ENCOUNTER — Other Ambulatory Visit: Payer: Self-pay | Admitting: *Deleted

## 2016-03-01 ENCOUNTER — Encounter: Payer: Self-pay | Admitting: *Deleted

## 2016-03-01 ENCOUNTER — Telehealth: Payer: Self-pay | Admitting: *Deleted

## 2016-03-01 DIAGNOSIS — Z952 Presence of prosthetic heart valve: Secondary | ICD-10-CM | POA: Diagnosis not present

## 2016-03-01 DIAGNOSIS — I482 Chronic atrial fibrillation: Secondary | ICD-10-CM | POA: Diagnosis not present

## 2016-03-01 DIAGNOSIS — I959 Hypotension, unspecified: Secondary | ICD-10-CM | POA: Diagnosis not present

## 2016-03-01 DIAGNOSIS — Z95828 Presence of other vascular implants and grafts: Secondary | ICD-10-CM | POA: Diagnosis not present

## 2016-03-01 DIAGNOSIS — I213 ST elevation (STEMI) myocardial infarction of unspecified site: Secondary | ICD-10-CM | POA: Diagnosis not present

## 2016-03-01 DIAGNOSIS — Z955 Presence of coronary angioplasty implant and graft: Secondary | ICD-10-CM | POA: Diagnosis not present

## 2016-03-01 DIAGNOSIS — I10 Essential (primary) hypertension: Secondary | ICD-10-CM | POA: Diagnosis not present

## 2016-03-01 DIAGNOSIS — Z87891 Personal history of nicotine dependence: Secondary | ICD-10-CM | POA: Diagnosis not present

## 2016-03-01 DIAGNOSIS — I2510 Atherosclerotic heart disease of native coronary artery without angina pectoris: Secondary | ICD-10-CM | POA: Diagnosis not present

## 2016-03-01 DIAGNOSIS — I607 Nontraumatic subarachnoid hemorrhage from unspecified intracranial artery: Secondary | ICD-10-CM | POA: Diagnosis not present

## 2016-03-01 NOTE — Telephone Encounter (Signed)
Pt was on TCM list admitted for Hypotention. D/C 02/29/16, a referral was sent for [facility] management, and pt will be f/u w/cardiology on 8/4...Jason Hawkins

## 2016-03-01 NOTE — Telephone Encounter (Signed)
Patient contacted regarding discharge from [facility] on 7/25.  Patient understands to follow up with provider Truitt Merle, NP on Friday Aug. 4 at 10:00 at [facility]. Patient understands discharge instructions? Yes Patient understands medications and regiment? Yes Patient understands to bring all medications to this visit? Yes  Patient denies complaints and is aware to call the office with questions or concerns prior to follow-up appointment.

## 2016-03-01 NOTE — Patient Outreach (Signed)
[facility]) Care Management [place] Telephone Outreach, transition of Care day 1 03/01/2016  Tylen Baab 11-08-1927 [id number]  Successful telephone outreach to "[employer], 82 y/o male followed by [place] for transition of care after recent IP hospital visit June 27-February 28, 2016 for [facility]; patient went to IP rehab after acute hospital visit, where he experienced STEMI and was re-admitted to acute IP hospital.  Pt. has history of chronic AF, AS, HTN, and HLD.  Mr. Vance was discharged home on February 28, 2016 with [facility]) services for nursing and PT.  HIPAA/ identity verified with patient.  Today, Mr. Erstad reports that he is "doing much better" since his discharge home.  He reports that his family is staying with him until he fully recuperates.  Mr. Wies asked that I speak to his oldest son, Lynnae Sandhoff, for details of his discharge home.  Lynnae Sandhoff reports today that he is staying with Mr. Jahoda this week and explained that patient's family are alternating staying with patient since his discharge home.  Son Sedgwick Trundle is on [facility] signed consent, and Lynnae Sandhoff reports that [name] is our of town, so he is staying with patient this week.  Lynnae Sandhoff reports that Mr. Routt is "doing great," stating that patient was able to take a shower/ shave "on his own" today and is able to ambulate around his home without difficulty.  Lynnae Sandhoff reports that [facility] nurse arrived today, and that PT will be coming "soon."  Lynnae Sandhoff reports that Mr. Prieur "is a little weak," but states that otherwise he seems to be "feeling fine."  Lynnae Sandhoff reports that Mr. Casserly has all of his medications and takes them as prescribed.  Lynnae Sandhoff states that patient was able to fill his own pill box today with supervision from himself and the [facility] nurse.  Lynnae Sandhoff also reports an accurate summary of upcoming provider appointments, stating that patient's family will be taking him to and from provider  appointments.  Lynnae Sandhoff reports that "the main thing we are working on is blood pressure management," and states that they are monitoring and recording mr. Mcweeney's BP's regularly.  Today, I discussed [facility] services with both Mr. Jaggers and Lynnae Sandhoff, and explained the difference between [place] and [facility] services.  Mr. Schlabach did not remember signing the consent, stating, "I was in the hospital so long, there is a whole lot I don't remember."  Mr. Dietert and son Lynnae Sandhoff verbally agreed to ongoing [place] involvement in patient's care, and I explained that I would be making weekly phone calls to follow up with patient and his family, and we were able to schedule a [facility] CM in-home visit for August today.  I made sure that Mr. Sobrino and Lynnae Sandhoff had the numbers tor main [facility] CM phone line, 24-hour nurse line, and my direct phone number; both denied further concerns, questions, needs, or problems.   Plan:  Mr. Casagrande will continue taking his medications as they are prescribed and will attend all scheduled provider appointments.  Mr. Stucker will work with home health services as they are ordered.  Mr. Pucillo will continue monitoring and recording his BP's regularly.  Mr. Ries or his family will contact her providers for any new concerns, questions, problems, or issues.   [place] telephone outreach for ongoing transition of care scheduled next week, with home visit scheduled in 2 weeks.   [name], RN, BSN, [place] Coordinator [facility] Care Management  [phone]  [phone]    

## 2016-03-02 ENCOUNTER — Encounter: Payer: Self-pay | Admitting: *Deleted

## 2016-03-03 ENCOUNTER — Telehealth: Payer: Self-pay | Admitting: Internal Medicine

## 2016-03-03 NOTE — Telephone Encounter (Signed)
Sent call to team health they transfer back here  Home care called wanting to get an order for urine sample Pt passing blood clots last night burning when going to bathroom Pt was in hospital and was treated for uti. They took over pt care on 7/27    Before I could get name of person or phone of person calling  Call was disconnected

## 2016-03-03 NOTE — Telephone Encounter (Signed)
Spoke with [facility] nurse and gave verbal order for UA with C/s b/c pt was having some burning and passing some clots last night.

## 2016-03-03 NOTE — Telephone Encounter (Signed)
Heather @ [facility] called back  [phone] Ext [phone]   They would like to to an   UA-CNS Pt had foley cath while in hospital Son wasn't for sure when this was discontinued

## 2016-03-04 DIAGNOSIS — I607 Nontraumatic subarachnoid hemorrhage from unspecified intracranial artery: Secondary | ICD-10-CM | POA: Diagnosis not present

## 2016-03-04 DIAGNOSIS — I482 Chronic atrial fibrillation: Secondary | ICD-10-CM | POA: Diagnosis not present

## 2016-03-04 DIAGNOSIS — I213 ST elevation (STEMI) myocardial infarction of unspecified site: Secondary | ICD-10-CM | POA: Diagnosis not present

## 2016-03-04 DIAGNOSIS — I10 Essential (primary) hypertension: Secondary | ICD-10-CM | POA: Diagnosis not present

## 2016-03-04 DIAGNOSIS — I959 Hypotension, unspecified: Secondary | ICD-10-CM | POA: Diagnosis not present

## 2016-03-04 DIAGNOSIS — I2510 Atherosclerotic heart disease of native coronary artery without angina pectoris: Secondary | ICD-10-CM | POA: Diagnosis not present

## 2016-03-05 ENCOUNTER — Other Ambulatory Visit: Payer: Self-pay | Admitting: *Deleted

## 2016-03-05 ENCOUNTER — Other Ambulatory Visit (INDEPENDENT_AMBULATORY_CARE_PROVIDER_SITE_OTHER): Payer: Medicare Other

## 2016-03-05 ENCOUNTER — Telehealth: Payer: Self-pay | Admitting: Emergency Medicine

## 2016-03-05 DIAGNOSIS — R319 Hematuria, unspecified: Secondary | ICD-10-CM

## 2016-03-05 LAB — URINALYSIS, ROUTINE W REFLEX MICROSCOPIC
Bilirubin Urine: NEGATIVE
Ketones, ur: NEGATIVE
Nitrite: NEGATIVE
SPECIFIC GRAVITY, URINE: 1.025 (ref 1.000–1.030)
Total Protein, Urine: >=300 — AB
Urine Glucose: NEGATIVE
Urobilinogen, UA: 0.2 (ref 0.0–1.0)
pH: 6 (ref 5.0–8.0)

## 2016-03-05 MED ORDER — CEFUROXIME AXETIL 250 MG PO TABS: 250.0000 mg | ORAL_TABLET | Freq: Two times a day (BID) | ORAL | 0 refills | Status: DC

## 2016-03-05 NOTE — Patient Outreach (Addendum)
[facility]) Care Management [place] Telephone Outreach, Transition of Care day 5 03/05/2016  Cecilia Fordham 07-19-1928 [id number]   Ussuccessful telephone outreach to "[employer], 82 y/o male followed by [place] for transition of care after recent IP hospital visit June 27-February 28, 2016 for [facility]; patient went to IP rehab after acute hospital visit, where he experienced STEMI and was re-admitted to acute IP hospital.  Pt. has history of chronic AF, AS, HTN, and HLD.  Mr. Winsted was discharged home on February 28, 2016 with [facility]) services for nursing and PT.  Call today was a result of a voice mail (VM) received from patient's son Lynnae Sandhoff over weekend Leander Valen had given me verbal consent on Friday to speak with his son Lynnae Sandhoff, as son [name] was out of town), where [place] shared that Parley was experiencing urinary symptoms of burning with "some clotting."  Noted from review of EMR that pt.'s home health RN had placed call to pt.'s PCP over weekend to address these symptoms.  HIPAA compliant VM message left for Lynnae Sandhoff asking him to return my phone call from today if I can be of assistance.  Plan:  [place] telephone outreach for ongoing transition of care scheduled for later this week; will call back sooner if I receive return phone call from Mr. Geers or his family.  [name], RN, BSN, [employer] [facility] Care Management  [phone]

## 2016-03-05 NOTE — Telephone Encounter (Signed)
Pt has blood in urine and other UTI symptoms. Can pt get a UA order put in. There are no available appointments. If Dr Camila Li would like for him to be seen then is there a way he can work him in. Please call his son [name] back [phone]. Please follow up thanks.

## 2016-03-05 NOTE — Telephone Encounter (Signed)
UTI: Rx for Ceftin was emailed to [facility] Thx

## 2016-03-05 NOTE — Telephone Encounter (Signed)
Informed by Alvira Monday., front desk/scheduler that patients daughter dropped urine specimen off for UA. UA order placed.

## 2016-03-05 NOTE — Addendum Note (Signed)
Addended by: Cassandria Anger on: 03/05/2016 11:47 PM   Modules accepted: Orders

## 2016-03-05 NOTE — Patient Outreach (Signed)
[facility]) Care Management [place] Telephone Outreach, Transition of Care, day [place] Telephone Outreach, Coordination of Care 03/05/2016  Jason Hawkins 1927-10-05 [id number]  Successful telephone outreach to "Jason Hawkins, 83 y/o male followed by Jason Hawkins for transition of care after recent IP [place] visit June 27-February 28, 2016 for [facility]; patient went to IP rehab after acute [facility] visit, where he experienced STEMIand was re-admitted to acute IP [facility]. Pt. has history of chronic AF, AS, HTN, and HLD. [name] was discharged home on February 28, 2016 with [facility]) services for nursing and PT.  Return call received from earlier voice mail (VM) placed this morning to patient's son Jason Hawkins, who reported that over weekend patient had begun experiencing urinary symptoms of burning/ dysuria with "some clotting and blood noted in urine."  Jason Hawkins reports today that patient has progressively "felt worse," since he left me the original voice mail message on Saturday March 03, 2016.  Jason Hawkins reports that they have had a urine specimen in the refrigerator "since yesterday," waiting to be picked up by Jason Hawkins.  Jason Hawkins requested that patient's son Jason Hawkins be contacted for further management of this issue, and asked that I coordinate care with PCP office to address pt.'s new onset of symptoms.  [name] to make sure patient stays hydrated, and to follow up with his brother Jason Hawkins to relay plan of care, which he agreed to do.  I then placed a call to Jason Hawkins office at 10:15 am [phone]) and spoke with "Jason Hawkins."  I shared patients development of symptoms over the weekend.  Jason Hawkins agreed that staff at Jason Hawkins office will contact patient's son Jason Hawkins to advise on plan moving forward to address his new onset of urinary symptoms.  I then successfully followed up by telephone with patient's son Jason Hawkins at 11:15, who confirmed that he had heard back  from Jason Hawkins office staff regarding the new onset of symptoms his father is experiencing; Jason Hawkins stated that the staff advised him to bring urine sample in to office, as office visit appointment possibly cannot be made for today.  Jason Hawkins stated that he is waiting to hear back from Jason Hawkins staff for further instruction.  I again re-iterated with Jason Hawkins need to keep patient hydrated, and to follow instructions of PCP office regarding follow up appointment issues, medications, etc.  I made sure that Jason Hawkins has my direct phone number should they need further assistance prior to my next telephone outreach, which is scheduled for later this week.  Jason Hawkins agreed to call me or patient's PCP for any additional needs.   Plan:  [place] telephone outreach for ongoing transition of care scheduled for later this week; will call back sooner if I receive return phone call from [name] or his family.  [name], RN, BSN, [employer] [facility] Care Management  [phone]

## 2016-03-06 DIAGNOSIS — I10 Essential (primary) hypertension: Secondary | ICD-10-CM | POA: Diagnosis not present

## 2016-03-06 DIAGNOSIS — I607 Nontraumatic subarachnoid hemorrhage from unspecified intracranial artery: Secondary | ICD-10-CM | POA: Diagnosis not present

## 2016-03-06 DIAGNOSIS — I482 Chronic atrial fibrillation: Secondary | ICD-10-CM | POA: Diagnosis not present

## 2016-03-06 DIAGNOSIS — I213 ST elevation (STEMI) myocardial infarction of unspecified site: Secondary | ICD-10-CM | POA: Diagnosis not present

## 2016-03-06 DIAGNOSIS — I959 Hypotension, unspecified: Secondary | ICD-10-CM | POA: Diagnosis not present

## 2016-03-06 DIAGNOSIS — I2510 Atherosclerotic heart disease of native coronary artery without angina pectoris: Secondary | ICD-10-CM | POA: Diagnosis not present

## 2016-03-06 NOTE — Telephone Encounter (Signed)
Pt's son informed

## 2016-03-08 ENCOUNTER — Encounter: Payer: Medicare Other | Admitting: Physical Medicine & Rehabilitation

## 2016-03-08 ENCOUNTER — Other Ambulatory Visit: Payer: Self-pay | Admitting: *Deleted

## 2016-03-08 ENCOUNTER — Encounter: Payer: Self-pay | Admitting: *Deleted

## 2016-03-08 NOTE — Patient Outreach (Signed)
[facility]) Care Management [facility] CM Telephone Outreach, Transition of Care, day 8 03/08/2016  Jason Hawkins [birth year] [id number]  Successful telephone outreach to Jason Hawkins, son/ caregiver (on [facility] consent form) for "Jason Hawkins, 82 y/o male followed by [facility] for transition of care after recent IP hospital visit June 27-February 28, 2016 for [facility]; patient went to IP rehab after acute hospital visit, where he experienced STEMI and was re-admitted to acute IP hospital.  Pt. has history of chronic AF, AS, HTN, and HLD.  Jason Hawkins was discharged home on February 28, 2016 with [facility]) services for nursing and PT.  HIPAA/ identity verified with patient's son Jason Hawkins.  Today, Jason Hawkins continues to report that Jason Hawkins is "doing much better" since his discharge home.  Jason Hawkins confirms that Jason Hawkins was prescribed antibiotics by his PCP earlier this week due to the urinary symptoms Jason Hawkins was experiencing.  Jason Hawkins states that his symptoms have improved, and also states that Jason Hawkins was prescribed "medication for bladder spasms," which has also "greatly helped."  Jason Hawkins confirmed that Jason Hawkins continues taking all of his medications as they are prescribed, with minimal assistance from family members.  Jason Hawkins reports that Jason Hawkins is continuing to work with home health [facility]) RN and PT services, adding that he is "taking care of himself," with minimal assistance from his family members, who are readily available and active in [facility] daily care.  Jason Hawkins reports that Jason Hawkins is able to bathe/ shower, use bathroom, pay bills, and prepare meals for himself without assistance.  Jason Hawkins reports an accurate summary of upcoming provider appointments, stating that patient's family will be taking him to and from provider appointments. Jason Hawkins confirms that Jason Hawkins will be attending his upcoming cardiology provider appointment tomorrow, and his PCP "next week."   Jason Hawkins reports that Jason Hawkins has continued monitoring  and recording his BP's regularly, and states that his blood pressure readings "have all been very good."  Jason Hawkins reports that Jason Hawkins does not monitor or record his weights regularly.  Jason Hawkins denied further concerns, questions, needs, or problems today, and we confirmed our previously scheduled Jason Hawkins initial home visit for next week.   Plan:  Jason Hawkins will continue taking his medications as they are prescribed and will attend all scheduled provider appointments.  Jason Hawkins will work with home health services as they are ordered.  Jason Hawkins will continue monitoring and recording his BP's regularly.  Jason Hawkins or his family will contact her providers for any new concerns, questions, problems, or issues.   [place] outreach for ongoing transition of care scheduled next week with initial home visit.  Jason Rack, RN, BSN, [employer] [facility] Care Management  [phone]

## 2016-03-09 ENCOUNTER — Encounter: Payer: Medicare Other | Admitting: Nurse Practitioner

## 2016-03-09 DIAGNOSIS — I607 Nontraumatic subarachnoid hemorrhage from unspecified intracranial artery: Secondary | ICD-10-CM | POA: Diagnosis not present

## 2016-03-09 DIAGNOSIS — I482 Chronic atrial fibrillation: Secondary | ICD-10-CM | POA: Diagnosis not present

## 2016-03-09 DIAGNOSIS — I2510 Atherosclerotic heart disease of native coronary artery without angina pectoris: Secondary | ICD-10-CM | POA: Diagnosis not present

## 2016-03-09 DIAGNOSIS — I213 ST elevation (STEMI) myocardial infarction of unspecified site: Secondary | ICD-10-CM | POA: Diagnosis not present

## 2016-03-09 DIAGNOSIS — I10 Essential (primary) hypertension: Secondary | ICD-10-CM | POA: Diagnosis not present

## 2016-03-09 DIAGNOSIS — I959 Hypotension, unspecified: Secondary | ICD-10-CM | POA: Diagnosis not present

## 2016-03-12 DIAGNOSIS — I2510 Atherosclerotic heart disease of native coronary artery without angina pectoris: Secondary | ICD-10-CM | POA: Diagnosis not present

## 2016-03-12 DIAGNOSIS — I959 Hypotension, unspecified: Secondary | ICD-10-CM | POA: Diagnosis not present

## 2016-03-12 DIAGNOSIS — I10 Essential (primary) hypertension: Secondary | ICD-10-CM | POA: Diagnosis not present

## 2016-03-12 DIAGNOSIS — I607 Nontraumatic subarachnoid hemorrhage from unspecified intracranial artery: Secondary | ICD-10-CM | POA: Diagnosis not present

## 2016-03-12 DIAGNOSIS — I482 Chronic atrial fibrillation: Secondary | ICD-10-CM | POA: Diagnosis not present

## 2016-03-12 DIAGNOSIS — I213 ST elevation (STEMI) myocardial infarction of unspecified site: Secondary | ICD-10-CM | POA: Diagnosis not present

## 2016-03-13 ENCOUNTER — Ambulatory Visit (INDEPENDENT_AMBULATORY_CARE_PROVIDER_SITE_OTHER): Payer: Medicare Other | Admitting: Internal Medicine

## 2016-03-13 ENCOUNTER — Encounter: Payer: Self-pay | Admitting: Internal Medicine

## 2016-03-13 DIAGNOSIS — I2121 ST elevation (STEMI) myocardial infarction involving left circumflex coronary artery: Secondary | ICD-10-CM | POA: Diagnosis not present

## 2016-03-13 DIAGNOSIS — J0100 Acute maxillary sinusitis, unspecified: Secondary | ICD-10-CM

## 2016-03-13 DIAGNOSIS — I739 Peripheral vascular disease, unspecified: Secondary | ICD-10-CM

## 2016-03-13 DIAGNOSIS — E785 Hyperlipidemia, unspecified: Secondary | ICD-10-CM | POA: Diagnosis not present

## 2016-03-13 DIAGNOSIS — I482 Chronic atrial fibrillation, unspecified: Secondary | ICD-10-CM

## 2016-03-13 DIAGNOSIS — I10 Essential (primary) hypertension: Secondary | ICD-10-CM | POA: Diagnosis not present

## 2016-03-13 DIAGNOSIS — J019 Acute sinusitis, unspecified: Secondary | ICD-10-CM | POA: Insufficient documentation

## 2016-03-13 MED ORDER — PHOSPHATIDYLSERINE-DHA-EPA 100-19.5-6.5 MG PO CAPS: 1.0000 | ORAL_CAPSULE | Freq: Every day | ORAL | 11 refills | Status: DC

## 2016-03-13 MED ORDER — CEFUROXIME AXETIL 250 MG PO TABS: 250.0000 mg | ORAL_TABLET | Freq: Two times a day (BID) | ORAL | 0 refills | Status: DC

## 2016-03-13 NOTE — Assessment & Plan Note (Signed)
Eliquis 

## 2016-03-13 NOTE — Assessment & Plan Note (Signed)
Ceftin x 10 more days

## 2016-03-13 NOTE — Assessment & Plan Note (Signed)
Lipitor, Plavix

## 2016-03-13 NOTE — Progress Notes (Signed)
Subjective:  Patient ID: Jason [facility], male    DOB: 07-05-1928  Age: 82 y.o. MRN: [id number]  CC: No chief complaint on file.   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 apixaban (ELIQUIS) 2.5 MG TABS tablet Take 1 tablet (2.5 mg total) by mouth 2 (two) times daily. 60 tablet 12  . atorvastatin (LIPITOR) 80 MG tablet Take 1 tablet (80 mg total) by mouth daily at 6 PM. 30 tablet 12  . cefUROXime (CEFTIN) 250 MG tablet Take 1 tablet (250 mg total) by mouth 2 (two) times daily. 20 tablet 0  . clopidogrel (PLAVIX) 75 MG tablet Take 1 tablet (75 mg total) by mouth daily with breakfast. 30 tablet 12  . famotidine (PEPCID) 20 MG tablet Take 1 tablet (20 mg total) by mouth 2 (two) times daily. 60 tablet 6  . flavoxATE (URISPAS) 100 MG tablet Take 1 tablet (100 mg total) by mouth 3 (three) times daily as needed for bladder spasms. 30 tablet 0  . Multiple Vitamin (MULTIVITAMIN) capsule Take 1 capsule by mouth daily.       No facility-administered medications prior to visit.     ROS Review of Systems  Constitutional: Positive for fatigue. Negative for appetite change and unexpected weight change.  HENT: Positive for sinus pressure. Negative for congestion, nosebleeds, sneezing, sore throat, trouble swallowing and voice change.   Eyes: Negative for itching and visual disturbance.  Respiratory: Negative for cough.   Cardiovascular: Negative for chest pain, palpitations and leg swelling.  Gastrointestinal: Negative for abdominal distention, blood in stool, diarrhea and nausea.  Genitourinary: Negative for frequency and hematuria.  Musculoskeletal: Negative for back pain, gait problem, joint swelling and neck pain.  Skin:  Negative for rash.  Neurological: Negative for dizziness, tremors, speech difficulty and weakness.  Psychiatric/Behavioral: Positive for decreased concentration. Negative for agitation, confusion, dysphoric mood and sleep disturbance. The patient is not nervous/anxious.     Objective:  BP 140/80   Pulse 78   Temp 97.7 F (36.5 C) (Oral)   Wt 176 lb (79.8 kg)   SpO2 98%   BMI 25.62 kg/m   BP Readings from Last 3 Encounters:  03/13/16 140/80  02/28/16 (!) 146/79  02/24/16 (!) 94/46    Wt Readings from Last 3 Encounters:  03/13/16 176 lb (79.8 kg)  02/28/16 171 lb 1.6 oz (77.6 kg)  02/24/16 170 lb 4.8 oz (77.2 kg)    Physical Exam  Constitutional: He is oriented to person, place, and time. He appears well-developed. No distress.  NAD  HENT:  Mouth/Throat: Oropharynx is clear and moist.  Eyes: Conjunctivae are normal. Pupils are equal, round, and reactive to light.  Neck: Normal range of motion. No JVD present. No thyromegaly present.  Cardiovascular: Normal rate, regular rhythm, normal heart sounds and intact distal pulses.  Exam reveals no gallop and no friction rub.   No murmur heard. Pulmonary/Chest: Effort normal and breath sounds normal. No respiratory distress. He has no wheezes. He has no rales. He exhibits no tenderness.  Abdominal: Soft. Bowel sounds are normal. He exhibits no distension and no mass. There is no tenderness. There is no rebound and no guarding.  Musculoskeletal: Normal range of motion. He exhibits no edema or tenderness.  Lymphadenopathy:  He has no cervical adenopathy.  Neurological: He is alert and oriented to person, place, and time. He has normal reflexes. No cranial nerve deficit. He exhibits normal muscle tone. He displays a negative Romberg sign. Coordination abnormal. Gait normal.  Skin: Skin is warm and dry. No rash noted.  Psychiatric: He has a normal mood and affect. His behavior is normal. Judgment and thought content normal.  Romberg  (-) No pronator drift  Lab Results  Component Value Date   WBC 8.6 02/27/2016   HGB 12.6 (L) 02/27/2016   HCT 37.9 (L) 02/27/2016   PLT 197 02/27/2016   GLUCOSE 99 02/27/2016   CHOL 157 02/16/2016   TRIG 57 02/16/2016   HDL 46 02/16/2016   LDLCALC 100 (H) 02/16/2016   ALT 34 02/24/2016   AST 28 02/24/2016   NA 137 02/27/2016   K 4.0 02/27/2016   CL 106 02/27/2016   CREATININE 0.76 02/27/2016   BUN 14 02/27/2016   CO2 22 02/27/2016   TSH 1.52 01/04/2016   PSA 0.22 01/04/2016   INR 1.28 02/03/2016   HGBA1C 5.6 02/16/2016    No results found.  Assessment & Plan:   There are no diagnoses linked to this encounter. I am having Mr. Score maintain his multivitamin, apixaban, atorvastatin, clopidogrel, famotidine, flavoxATE, and cefUROXime.  No orders of the defined types were placed in this encounter.    Follow-up: No Follow-up on file.  Walker Kehr, MD

## 2016-03-13 NOTE — Progress Notes (Signed)
Pre visit review using our clinic review tool, if applicable. No additional management support is needed unless otherwise documented below in the visit note. 

## 2016-03-13 NOTE — Assessment & Plan Note (Signed)
On Lipitor 

## 2016-03-13 NOTE — Assessment & Plan Note (Signed)
BP Readings from Last 3 Encounters:  03/13/16 140/80  02/28/16 (!) 146/79  02/24/16 (!) 94/46  NAS diet

## 2016-03-15 ENCOUNTER — Other Ambulatory Visit: Payer: Self-pay | Admitting: *Deleted

## 2016-03-15 ENCOUNTER — Encounter: Payer: Self-pay | Admitting: *Deleted

## 2016-03-15 DIAGNOSIS — I482 Chronic atrial fibrillation: Secondary | ICD-10-CM | POA: Diagnosis not present

## 2016-03-15 DIAGNOSIS — I2510 Atherosclerotic heart disease of native coronary artery without angina pectoris: Secondary | ICD-10-CM | POA: Diagnosis not present

## 2016-03-15 DIAGNOSIS — I10 Essential (primary) hypertension: Secondary | ICD-10-CM | POA: Diagnosis not present

## 2016-03-15 DIAGNOSIS — I213 ST elevation (STEMI) myocardial infarction of unspecified site: Secondary | ICD-10-CM | POA: Diagnosis not present

## 2016-03-15 DIAGNOSIS — I607 Nontraumatic subarachnoid hemorrhage from unspecified intracranial artery: Secondary | ICD-10-CM | POA: Diagnosis not present

## 2016-03-15 DIAGNOSIS — I959 Hypotension, unspecified: Secondary | ICD-10-CM | POA: Diagnosis not present

## 2016-03-15 NOTE — Patient Outreach (Signed)
[facility]) Care Management  [place] Initial Home Visit, Transition of Care day 15 03/15/2016  Jadrian Murphy October 28, 1927 [id number]  Tallyn Schatzel "Liahm" is an 82 y.o. male followed by [place] for transition of care after recent IP hospital visit June 27-February 28, 2016 for [facility]; patient went to IP rehab after acute hospital visit, where he experienced STEMI and was re-admitted to acute IP hospital.  Pt. has history of chronic AF, AS, HTN, and HLD.  Mr. Bonafede was discharged home on February 28, 2016 with [facility]) services for nursing and PT.  Today, Yvon reports that he is doing "great" since his discharge home from the hospital.  Patient reports he attended PCP visit March 13, 2016 where antibiotics were continued due to patient signs/ symptoms of new sinusitis.  Urinary symptoms reported by patient with previous [facility] CM outreach are now resolved per patient report. Eriel reports accurate reporting of future provider appointments, all of which he states he plans to attend.  Zorian reports today that he has been actively working with home health [facility]) services for PT and RN.  Barkley reports he doesn't feel he needs "all of this home care," as he "is doing so well."  Dailyn reports that he is "back to my normal self."  HH PT present during part of today's [facility] CM in home visit, confirms that [facility] services will continue for 2 more weeks with RN and PT.  Patient was encouraged to continue participating with [facility] services while they are in place, and patient agrees to do so.  Dustyn reports that he has and is taking all of his medications as prescribed; verbalizes good understanding of purpose, scheduling and dosages of all medications.  Patient was recently discharged from hospital and all medications were reviewed with him today during [place] in home visit.  Steel reports excellent support system through family members who live within walking distance  to patient; patient states that his family members "keep a close eye" on him, and assist him with any needs.  Community resource needs denied by patient today.  Today, we reviewed patient's recorded BP logs.  Derico has been monitoring and recording his BP's at least once a day, and his BP's have been ranging from 102-156/56-78.  Patient stated that he wasn't sure if he would make his BP checks a "part of my daily routine" now that he is getting better.  I encouraged patient to continue checking his BP's, even when he is feeling well, and we discussed that knowing his BPs is valuable information, even when he is feeling well.  Ikeem said that he would consider continuing his BP checks, "especially since it only takes a few minutes to do."  Haniel reports that he currently does not monitor his weight, stating, "I have never been told to weigh, and my weights seem to always be the same on the few occasions I do check it."  We also discussed importance of continuing follow up post-hospital discharge with ongoing [place] services, with [facility] PT who was present during part of vsit, as Teigan said several times that he did not think he needs "so many people" coming to check on him; I explained [place] CM follow up would continue with at least 2 more weeks of telephone outreach, and after consideration and talking with [facility] PT and myself, patient agreed to another [place] in home visit 2 weeks after [facility] services are scheduled to be  completed, in approximately one month.  I made sure patient had my direct phone number, the main number to [place], and the 24-hour nurse line.  Patient denies further needs, concerns, questions, or problems today and we scheduled our next [place] in- home visit today.  Subjective: "I am doing so good now that I am home.... My family isn't staying with me anymore, and I am managing well on my own.  I am not sure I need all this help that is coming into my home."  Objective:     BP 126/64   Pulse 68   Resp 18   SpO2 98%    Review of Systems  Constitutional: Negative.  Negative for fever, malaise/fatigue and weight loss.  HENT: Negative.   Respiratory: Negative.  Negative for cough, sputum production, shortness of breath and wheezing.   Cardiovascular: Negative for chest pain and leg swelling.  Gastrointestinal: Negative for abdominal pain, heartburn and nausea.  Genitourinary: Negative for dysuria and urgency.  Musculoskeletal: Negative for back pain, falls, joint pain and myalgias.  Skin: Negative.   Neurological: Negative.  Negative for weakness.  Psychiatric/Behavioral: Negative for depression. The patient is not nervous/anxious.     Physical Exam  Constitutional: He is oriented to person, place, and time. He appears well-developed and well-nourished.  Cardiovascular: Normal rate, regular rhythm, normal heart sounds and intact distal pulses.   Pulses:      Radial pulses are 2+ on the right side, and 2+ on the left side.       Dorsalis pedis pulses are 1+ on the right side, and 1+ on the left side.  Respiratory: Effort normal and breath sounds normal. No respiratory distress. He has no wheezes. He has no rales.  GI: Soft. Bowel sounds are normal.  Musculoskeletal: Normal range of motion. He exhibits no edema.  Neurological: He is alert and oriented to person, place, and time.  Skin: Skin is warm and dry.  Psychiatric: He has a normal mood and affect. His behavior is normal. Judgment and thought content normal.    Encounter Medications:   Outpatient Encounter Prescriptions as of 03/15/2016  Medication Sig Note  . apixaban (ELIQUIS) 2.5 MG TABS tablet Take 1 tablet (2.5 mg total) by mouth 2 (two) times daily.   [name] atorvastatin (LIPITOR) 80 MG tablet Take 1 tablet (80 mg total) by mouth daily at 6 PM. 03/15/2016: Patient reports taking one half to one quarter of a pill daily, patient states that he has discussed this with his PCP  . cefUROXime (CEFTIN)  250 MG tablet Take 1 tablet (250 mg total) by mouth 2 (two) times daily.   . clopidogrel (PLAVIX) 75 MG tablet Take 1 tablet (75 mg total) by mouth daily with breakfast.   . famotidine (PEPCID) 20 MG tablet Take 1 tablet (20 mg total) by mouth 2 (two) times daily.   . Multiple Vitamin (MULTIVITAMIN) capsule Take 1 capsule by mouth daily.     . Phosphatidylserine-DHA-EPA (VAYACOG) 100-19.5-6.5 MG CAPS Take 1 capsule by mouth daily.   . flavoxATE (URISPAS) 100 MG tablet Take 1 tablet (100 mg total) by mouth 3 (three) times daily as needed for bladder spasms. (Patient not taking: Reported on 03/15/2016)    No facility-administered encounter medications on file as of 03/15/2016.     Functional Status:   In your present state of health, do you have any difficulty performing the following activities: 03/08/2016 03/01/2016  Hearing? - Y  Vision? - N  Difficulty  concentrating or making decisions? - N  Walking or climbing stairs? - N  Dressing or bathing? - N  Doing errands, shopping? - N  Conservation officer, nature and eating ? N -  Using the Toilet? N -  In the past six months, have you accidently leaked urine? N -  Do you have problems with loss of bowel control? N -  Managing your Medications? N -  Managing your Finances? N -  Housekeeping or managing your Housekeeping? N -  Some recent data might be hidden    Fall/Depression Screening:    PHQ 2/9 Scores 01/04/2015  PHQ - 2 Score 0    Assessment:  Braxtin is doing very well in his recuperation since his discharge home from the hospital and he denies current needs.  Sanjay has a good understanding of his medications, and takes his medications as they are prescribed.  Bhavin attends his provider appointments as scheduled, and is actively working with [facility] services.  Isias has a very supportive family network.  No patient safety concerns were identified during today's [place] in home visit.     Plan:   Mr. Agar will continue taking his medications  as they are prescribed and will attend all scheduled provider appointments.   Mr. Rizk will continue working with home health services as they are ordered.   Mr. Imming will continue monitoring and recording his BP's daily.   Mr. Nordland or his family will contact her providers for any new concerns, questions, problems, or issues.    [place] outreach for ongoing transition of care scheduled next week with initial home visit.  I appreciate the opportunity to participate in Adair's care,  [name], RN, BSN, [place] Coordinator [facility] Care Management  [phone]

## 2016-03-19 DIAGNOSIS — I2510 Atherosclerotic heart disease of native coronary artery without angina pectoris: Secondary | ICD-10-CM | POA: Diagnosis not present

## 2016-03-19 DIAGNOSIS — I607 Nontraumatic subarachnoid hemorrhage from unspecified intracranial artery: Secondary | ICD-10-CM | POA: Diagnosis not present

## 2016-03-19 DIAGNOSIS — I213 ST elevation (STEMI) myocardial infarction of unspecified site: Secondary | ICD-10-CM | POA: Diagnosis not present

## 2016-03-19 DIAGNOSIS — I10 Essential (primary) hypertension: Secondary | ICD-10-CM | POA: Diagnosis not present

## 2016-03-19 DIAGNOSIS — I959 Hypotension, unspecified: Secondary | ICD-10-CM | POA: Diagnosis not present

## 2016-03-19 DIAGNOSIS — I482 Chronic atrial fibrillation: Secondary | ICD-10-CM | POA: Diagnosis not present

## 2016-03-20 DIAGNOSIS — I607 Nontraumatic subarachnoid hemorrhage from unspecified intracranial artery: Secondary | ICD-10-CM | POA: Diagnosis not present

## 2016-03-20 DIAGNOSIS — I10 Essential (primary) hypertension: Secondary | ICD-10-CM | POA: Diagnosis not present

## 2016-03-20 DIAGNOSIS — I959 Hypotension, unspecified: Secondary | ICD-10-CM | POA: Diagnosis not present

## 2016-03-20 DIAGNOSIS — I482 Chronic atrial fibrillation: Secondary | ICD-10-CM | POA: Diagnosis not present

## 2016-03-20 DIAGNOSIS — I2510 Atherosclerotic heart disease of native coronary artery without angina pectoris: Secondary | ICD-10-CM | POA: Diagnosis not present

## 2016-03-20 DIAGNOSIS — I213 ST elevation (STEMI) myocardial infarction of unspecified site: Secondary | ICD-10-CM | POA: Diagnosis not present

## 2016-03-21 ENCOUNTER — Other Ambulatory Visit: Payer: Self-pay | Admitting: *Deleted

## 2016-03-21 ENCOUNTER — Encounter: Payer: Medicare Other | Admitting: Physical Medicine & Rehabilitation

## 2016-03-21 ENCOUNTER — Ambulatory Visit: Payer: Self-pay | Admitting: *Deleted

## 2016-03-21 NOTE — Patient Outreach (Signed)
[facility]) Care Management [place] Telephone Outreach, Transition of Care, day 21 03/21/2016  Jason Hawkins 05-19-1928 [id number]  Unsuccessful telephone outreach to "[facility], 82 y/o male followed by [place] for transition of care after recent IP hospital visit June 27-February 28, 2016 for [facility]; patient went to IP rehab after acute hospital visit, where he experienced STEMIand was re-admitted to acute IP hospital. Pt. has history of chronic AF, AS, HTN, and HLD. [name] was discharged home on February 28, 2016 with [facility]) services for nursing and PT.  HIPAA compliant voice mail message left for Jason Hawkins, asking him to return my call.  Plan:  I will re-attempt [facility] CM telephone outreach for ongoing transition of care tomorrow if I do not hear back from [place] first.   [name], RN, BSN, [place] Coordinator [facility] Care Management  [phone]

## 2016-03-22 ENCOUNTER — Other Ambulatory Visit: Payer: Self-pay | Admitting: *Deleted

## 2016-03-22 DIAGNOSIS — I2510 Atherosclerotic heart disease of native coronary artery without angina pectoris: Secondary | ICD-10-CM | POA: Diagnosis not present

## 2016-03-22 DIAGNOSIS — I213 ST elevation (STEMI) myocardial infarction of unspecified site: Secondary | ICD-10-CM | POA: Diagnosis not present

## 2016-03-22 DIAGNOSIS — I959 Hypotension, unspecified: Secondary | ICD-10-CM | POA: Diagnosis not present

## 2016-03-22 DIAGNOSIS — I482 Chronic atrial fibrillation: Secondary | ICD-10-CM | POA: Diagnosis not present

## 2016-03-22 DIAGNOSIS — I607 Nontraumatic subarachnoid hemorrhage from unspecified intracranial artery: Secondary | ICD-10-CM | POA: Diagnosis not present

## 2016-03-22 DIAGNOSIS — I10 Essential (primary) hypertension: Secondary | ICD-10-CM | POA: Diagnosis not present

## 2016-03-22 NOTE — Patient Outreach (Signed)
[place] [facility]) Care Management [place] Telephone Outreach, Transition of Care, day 22 03/22/2016  Jason Hawkins 04-Nov-1927 [id number]   Jason Hawkins "Jason Hawkins" is an 82 y.o. male followed by [place] for transition of care after recent IP hospital visit June 27-February 28, 2016 for [facility]; patient went to IP rehab after acute hospital visit, where he experienced STEMI and was re-admitted to acute IP hospital.  Pt. has history of chronic AF, AS, HTN, and HLD.  Mr. Szalay was discharged home on February 28, 2016 with [facility]) services for nursing and PT.  Today, Jason Hawkins reports that "things are going about the same;" stating that he is feeling overall "very good," but is disappointed that his energy level is not consistently better.  Jason Hawkins states that he is resting well and sleeping good, but on same days "just has no energy."  Jason Hawkins reports that during one of his [facility] PT sessions, he just didn't "feel like" doing the execercises and didn't get much accomplished during that PT session.  Jason Hawkins then stated that yesterday was a "better energy day," and stated that he was able to mow his yard and run errands without "feeling zapped."  I encouraged Jason Hawkins to be patient with himself as he continues to recuperate and provided emotional support and encouragement to him.  Jason Hawkins denied other concerning signs/ symptoms, and reported that he was continuing to take his medications as they are prescribed and taking his BP's regularly, "which are in the same range as they have been."  Jason Hawkins denies further issues, problems, concerns or needs today.  I encouraged Jason Hawkins to contact his providers promptly should he develop concerning signs/ symptoms, and he agreed to do so.  I also confirmed that Jason Hawkins had my direct phone number, the main [facility] CM phone number, and the 24-hour nurse line pone number.  We confirmed our [place] in-home visit for next month.   Plan:  [name] will  continue taking his medications as they are prescribed and will attend all scheduled provider appointments.   [name] will continue working with home health services as they are ordered.   [name] will continue monitoring and recording his BP's daily.   [name] or his family will contact her providers for any new concerns, questions, problems, or issues.    [place] outreach for ongoing transition of care scheduled next week with telephone outreach, and in- home visit next month.   Jason Rack, RN, BSN, [employer] [facility] Care Management  [phone]

## 2016-03-23 ENCOUNTER — Encounter: Payer: Self-pay | Admitting: Cardiovascular Disease

## 2016-03-23 ENCOUNTER — Ambulatory Visit: Payer: PRIVATE HEALTH INSURANCE | Admitting: Nurse Practitioner

## 2016-03-23 NOTE — Progress Notes (Signed)
Patient ID: Jason Hawkins, male   DOB: 1927-09-24, 82 y.o.   MRN: [id number]   Jason Hawkins is seen today for F/U Still not same since wife died  They were married for over 36 years and she was a patient of ours as well. Jason Hawkins has a history of AVR and afib.  Had [facility] with coil embolization of artery on 02/01/16. Complicated by STEMI while in rehab. Coumadin changed to eliquis.  Had some hypotension at rehab and meds cut back.    Cath showed single vessel distal circumflex Dx Conclusion    Prox RCA to Mid RCA lesion, 30% stenosed.  Prox LAD to Mid LAD lesion, 30% stenosed.  Ost 1st Mrg to 1st Mrg lesion, 80% stenosed. This is a very small branch.  Dist Cx lesion, 100% stenosed. Post intervention, there is a 0% residual stenosis.   1. 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 2. Successful stenting of the distal LCx with DES   EF preserved by echo 02/17/15 60-65%    ROS: Denies fever, malais, weight loss, blurry vision, decreased visual acuity, cough, sputum, SOB, hemoptysis, pleuritic pain, palpitaitons, heartburn, abdominal pain, melena, lower extremity edema, claudication, or rash.  All other systems reviewed and negative  General: Affect appropriate Healthy:  appears stated age 82: normal Neck supple with no adenopathy JVP normal no bruits no thyromegaly Lungs clear with no wheezing and good diaphragmatic motion Heart:  S1/S2 SEM  murmur, no rub, gallop or click PMI normal Abdomen: benighn, BS positve, no tenderness, no AAA no bruit.  No HSM or HJR Distal pulses intact with no bruits Plus 2 bilateral  Edema with stasis  Neuro non-focal Skin warm and dry No muscular weakness   Current Outpatient Prescriptions  Medication Sig Dispense Refill  . apixaban (ELIQUIS) 2.5 MG TABS tablet Take 1 tablet (2.5 mg total) by mouth 2 (two) times daily. 60 tablet 12  . atorvastatin (LIPITOR) 80 MG tablet Take 1 tablet (80 mg total) by mouth daily at 6 PM. 30 tablet 12  .  clopidogrel (PLAVIX) 75 MG tablet Take 1 tablet (75 mg total) by mouth daily with breakfast. 30 tablet 12  . famotidine (PEPCID) 20 MG tablet Take 1 tablet (20 mg total) by mouth 2 (two) times daily. 60 tablet 6  . flavoxATE (URISPAS) 100 MG tablet Take 1 tablet (100 mg total) by mouth 3 (three) times daily as needed for bladder spasms. 30 tablet 0  . Multiple Vitamin (MULTIVITAMIN) capsule Take 1 capsule by mouth daily.      . Phosphatidylserine-DHA-EPA (VAYACOG) 100-19.5-6.5 MG CAPS Take 1 capsule by mouth daily. 30 capsule 11   No current facility-administered medications for this visit.     Allergies  Bee venom; Codeine; and Lisinopril  Electrocardiogram:    04/28/14  afib rate 52  LAD  04/05/15  afib rate 59  Otherwise normal  Assessment and Plan AVR: normal valve clicks SBE  No need for f/u echo Edema  Venous disease stable continue  PRN diuretic Anticoagulation   Chol:  Lab Results  Component Value Date   LDLCALC 100 (H) 02/16/2016   CAD:  Stable no angina continue plavix.   SAH:  No residual neurologic deficits. 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

## 2016-03-26 DIAGNOSIS — I213 ST elevation (STEMI) myocardial infarction of unspecified site: Secondary | ICD-10-CM | POA: Diagnosis not present

## 2016-03-26 DIAGNOSIS — I482 Chronic atrial fibrillation: Secondary | ICD-10-CM | POA: Diagnosis not present

## 2016-03-26 DIAGNOSIS — I959 Hypotension, unspecified: Secondary | ICD-10-CM | POA: Diagnosis not present

## 2016-03-26 DIAGNOSIS — I10 Essential (primary) hypertension: Secondary | ICD-10-CM | POA: Diagnosis not present

## 2016-03-26 DIAGNOSIS — I2510 Atherosclerotic heart disease of native coronary artery without angina pectoris: Secondary | ICD-10-CM | POA: Diagnosis not present

## 2016-03-26 DIAGNOSIS — I607 Nontraumatic subarachnoid hemorrhage from unspecified intracranial artery: Secondary | ICD-10-CM | POA: Diagnosis not present

## 2016-03-27 ENCOUNTER — Ambulatory Visit (INDEPENDENT_AMBULATORY_CARE_PROVIDER_SITE_OTHER): Payer: Medicare Other | Admitting: Cardiovascular Disease

## 2016-03-27 ENCOUNTER — Encounter: Payer: Self-pay | Admitting: Cardiovascular Disease

## 2016-03-27 VITALS — BP 132/70 | HR 64 | Ht 69.5 in | Wt 177.5 lb

## 2016-03-27 DIAGNOSIS — I2583 Coronary atherosclerosis due to lipid rich plaque: Principal | ICD-10-CM

## 2016-03-27 DIAGNOSIS — I2510 Atherosclerotic heart disease of native coronary artery without angina pectoris: Secondary | ICD-10-CM | POA: Diagnosis not present

## 2016-03-27 DIAGNOSIS — I2121 ST elevation (STEMI) myocardial infarction involving left circumflex coronary artery: Secondary | ICD-10-CM

## 2016-03-27 MED ORDER — APIXABAN 2.5 MG PO TABS: 2.5000 mg | ORAL_TABLET | Freq: Two times a day (BID) | ORAL | 3 refills | Status: DC

## 2016-03-27 MED ORDER — APIXABAN 2.5 MG PO TABS: 2.5000 mg | ORAL_TABLET | Freq: Two times a day (BID) | ORAL | 1 refills | Status: DC

## 2016-03-27 MED ORDER — CLOPIDOGREL BISULFATE 75 MG PO TABS: 75.0000 mg | ORAL_TABLET | Freq: Every day | ORAL | 3 refills | Status: DC

## 2016-03-27 MED ORDER — CLOPIDOGREL BISULFATE 75 MG PO TABS: 75.0000 mg | ORAL_TABLET | Freq: Every day | ORAL | 1 refills | Status: DC

## 2016-03-27 NOTE — Patient Instructions (Addendum)
Medication Instructions:  Your physician recommends that you continue on your current medications as directed. Please refer to the Current Medication list given to you today.  Labwork: NONE  Testing/Procedures: NONE  Follow-Up: Your physician wants you to follow-up in: May with Dr. Johnsie Cancel. You will receive a reminder letter in the mail two months in advance. If you don't receive a letter, please call our office to schedule the follow-up appointment.   If you need a refill on your cardiac medications before your next appointment, please call your pharmacy.

## 2016-03-28 ENCOUNTER — Telehealth: Payer: Self-pay | Admitting: Cardiovascular Disease

## 2016-03-28 ENCOUNTER — Ambulatory Visit: Payer: PRIVATE HEALTH INSURANCE | Admitting: Cardiovascular Disease

## 2016-03-28 DIAGNOSIS — I213 ST elevation (STEMI) myocardial infarction of unspecified site: Secondary | ICD-10-CM | POA: Diagnosis not present

## 2016-03-28 DIAGNOSIS — I10 Essential (primary) hypertension: Secondary | ICD-10-CM | POA: Diagnosis not present

## 2016-03-28 DIAGNOSIS — I607 Nontraumatic subarachnoid hemorrhage from unspecified intracranial artery: Secondary | ICD-10-CM | POA: Diagnosis not present

## 2016-03-28 DIAGNOSIS — I2510 Atherosclerotic heart disease of native coronary artery without angina pectoris: Secondary | ICD-10-CM | POA: Diagnosis not present

## 2016-03-28 DIAGNOSIS — I482 Chronic atrial fibrillation: Secondary | ICD-10-CM | POA: Diagnosis not present

## 2016-03-28 DIAGNOSIS — I959 Hypotension, unspecified: Secondary | ICD-10-CM | POA: Diagnosis not present

## 2016-03-28 NOTE — Telephone Encounter (Signed)
Called patient about his message. Informed patient to follow-up with his PCP or urologist. Per Dr. Johnsie Cancel, patient can hold his eliquis for two days, to see if there is a change. Patient verbalized understanding.  Called home health nurse back, Berneta Sages. Informed him of Dr. Kyla Balzarine response to the patient and that the patient will need to call his PCP. Mark verbalized understanding.

## 2016-03-28 NOTE — Telephone Encounter (Signed)
New message     Nurse want to know if the we want a urine sample to check b/c pt has blood in his urine. Please call.

## 2016-03-29 ENCOUNTER — Encounter: Payer: Self-pay | Admitting: *Deleted

## 2016-03-29 ENCOUNTER — Other Ambulatory Visit: Payer: Self-pay | Admitting: *Deleted

## 2016-03-29 NOTE — Patient Outreach (Signed)
[facility]) Care Management [place] Telephone Outreach, Transition of Care day 29 03/29/2016  Jason Hawkins 06/04/1928 [id number]   Jason Hawkins" is an 82 y.o.malefollowed by [place] for transition of care after recent IP hospital visit June 27-February 28, 2016 for [facility]; patient went to IP rehab after acute hospital visit, where he experienced STEMIand was re-admitted to acute IP hospital. Pt. has history of chronic AF, AS, HTN, and HLD. Mr. Urbanek was discharged home on February 28, 2016 with [facility]) services for nursing and PT.  Today, Jason Hawkins reports that "things are the same;" stating that he is feeling overall "very good," but reports that he has developed blood in his urine, which was addressed during a recent [facility] RN visit, stating that the nurse contacted his cardiologist, who advised him to stop taking his Eliquis for 2 days and to call his PCP.  Jason Hawkins reports that he has stopped the Eliquis, and that his urine "is clearing up, it's just barely pink now."  Jason Hawkins stated that he did not wish to contact his PCP about this matter, reporting that "this same thing happened when I was on Coumadin, and they made me go see a urologist, and it was for nothing..... I am going to wait to see if it clears up before I call yet another doctor."  Jason Hawkins was advised to go ahead and let his PCP know that he was experiencing this symptom, but he adamantly refused to do so today, stating that he would "call next week," if it had not completely resolved by then, stating, "my cardiologist knows about it.... I am following his instructions he gave me about the Eliquis."  I discussed with Jason Hawkins the importance of letting his PCP know when concerns arise, and he agreed, but stated that he "was fine," and "nothing is wrong, I am improving."  We discussed concerning signs of bleeding, which Jason Hawkins was familiar with, and he assured me that he would contact his PCP if he became  concerned, which he said he "was not concerned," today.  Jason Hawkins did not wish for me to contact his PCP on his behalf, and stated that he "knows to call for problems."    Jason Hawkins stated that he has continued "having good days and bad days" around his energy level, and verbalized that he understands this may take "more time" before he feels "like himself" again. Jason Hawkins reports that [facility] PT has continued, "but is probably ending tomorrow."  Jason Hawkins reported that he was continuing to take his medications as they are prescribed and taking his BP's regularly, "which are holding steady where they always are."  Jason Hawkins denies further issues, problems, concerns or needs today.  I again encouraged Jason Hawkins to contact his providers promptly should he develop concerning signs/ symptoms, and he agreed to do so.  I also confirmed that Jason Hawkins had my direct phone number, the main [facility] CM phone number, and the 24-hour nurse line pone number.  We confirmed our [place] in-home visit for next month, and agreed that I would contact him next week to check on his symptom of having blood in his urine.   Plan:  Mr. Plucinski will continue taking his medications as they are prescribed and will attend all scheduled provider appointments.  Mr. Serafino will continue workingwith home health services as they are ordered.  Mr. Jimison will continue monitoring and recording his BP's daily.  Mr. Brindle or his family will contact her providers for any  new concerns, questions, problems, or issues.   [place] telephone outreach next week, and in- home visit next month.  [name], RN, BSN, [employer] [facility] Care Management  [phone]

## 2016-03-30 DIAGNOSIS — I10 Essential (primary) hypertension: Secondary | ICD-10-CM | POA: Diagnosis not present

## 2016-03-30 DIAGNOSIS — I2510 Atherosclerotic heart disease of native coronary artery without angina pectoris: Secondary | ICD-10-CM | POA: Diagnosis not present

## 2016-03-30 DIAGNOSIS — I959 Hypotension, unspecified: Secondary | ICD-10-CM | POA: Diagnosis not present

## 2016-03-30 DIAGNOSIS — I607 Nontraumatic subarachnoid hemorrhage from unspecified intracranial artery: Secondary | ICD-10-CM | POA: Diagnosis not present

## 2016-03-30 DIAGNOSIS — I482 Chronic atrial fibrillation: Secondary | ICD-10-CM | POA: Diagnosis not present

## 2016-03-30 DIAGNOSIS — I213 ST elevation (STEMI) myocardial infarction of unspecified site: Secondary | ICD-10-CM | POA: Diagnosis not present

## 2016-04-02 ENCOUNTER — Other Ambulatory Visit: Payer: Self-pay | Admitting: *Deleted

## 2016-04-02 ENCOUNTER — Encounter: Payer: Self-pay | Admitting: Internal Medicine

## 2016-04-02 NOTE — Patient Outreach (Signed)
Jason Hawkins [facility]) Care Management [place] Telephone Outreach 04/02/2016  Jason Hawkins [birth year] [id number]   Successful telephone outreach to Jason Hawkins," 82 y.o.malefollowed by [place] for transition of care after recent IP hospital visit June 27-February 28, 2016 for [facility]; patient went to IP rehab after acute hospital visit, where he experienced STEMIand was re-admitted to acute IP hospital. Pt. has history of chronic AF, AS, HTN, and HLD. Jason Hawkins was discharged home on February 28, 2016 with [facility]) services for nursing and PT.  During telephone outreach last week, Jason Hawkins reported that he had developed blood in his urine, which was addressed during [facility] RN visit, RN contacted his cardiologist, who advised him to stop taking his Eliquis for 2 days and to call his PCP.  Jason Hawkins reported that he had stopped the Eliquis, and that his urine "was clearing up, it's just barely pink now."  At that time, Jason Hawkins did not wish to contact his PCP about this matter, and wanted to wait to see if the issue resolved, following the advice of his cardiologist.  Today, Jason Hawkins reports that the blood in his urine last week "has completely cleared up," and that he has resumed his Eliquis, as advised by his cardiologist last week.  However, Jason Hawkins now reports that he is "having bladder spasms."  Jason Hawkins stated that he is going to call his PCP after our telephone conversation to report his symptoms.  I again advised Jason Hawkins to contact his doctor to make him aware of both his bleeding from last week and his new onset of bladder spasms, and he agreed to do so when our conversation was complete.  Jason Hawkins denies further issues, problems, concerns or needs today. We confirmed our [place] in-home visit for next month, and Jason Hawkins agreed to contact his providers promptly should any concerning symptoms or problems arise.   Plan:  Jason Hawkins will continue taking his  medications as they are prescribed and will attend all scheduled provider appointments.  Jason Hawkins will contact his PCP to provide update on his symptoms and to obtain instructions around resumption of his Eliquis/ new onset of bladder spasms.  Jason Hawkins will continue monitoring and recording his BP's daily.  Jason Hawkins or his family will contact her providers for any new concerns, questions, problems, or issues.   Jason Hawkins outreach to continue with scheduled in- home visitnext month.  Jason Rack, RN, BSN, [employer] [facility] Care Management  [phone]

## 2016-04-03 ENCOUNTER — Other Ambulatory Visit: Payer: Self-pay | Admitting: Internal Medicine

## 2016-04-03 ENCOUNTER — Encounter: Payer: Self-pay | Admitting: Internal Medicine

## 2016-04-03 MED ORDER — FLAVOXATE HCL 100 MG PO TABS: 100.0000 mg | ORAL_TABLET | Freq: Three times a day (TID) | ORAL | 2 refills | Status: DC | PRN

## 2016-04-04 ENCOUNTER — Ambulatory Visit: Payer: Medicare Other | Admitting: *Deleted

## 2016-04-12 ENCOUNTER — Encounter: Payer: Self-pay | Admitting: *Deleted

## 2016-04-12 ENCOUNTER — Other Ambulatory Visit: Payer: Self-pay | Admitting: *Deleted

## 2016-04-12 DIAGNOSIS — I213 ST elevation (STEMI) myocardial infarction of unspecified site: Secondary | ICD-10-CM | POA: Diagnosis not present

## 2016-04-12 DIAGNOSIS — I959 Hypotension, unspecified: Secondary | ICD-10-CM | POA: Diagnosis not present

## 2016-04-12 DIAGNOSIS — I482 Chronic atrial fibrillation: Secondary | ICD-10-CM | POA: Diagnosis not present

## 2016-04-12 DIAGNOSIS — I10 Essential (primary) hypertension: Secondary | ICD-10-CM | POA: Diagnosis not present

## 2016-04-12 DIAGNOSIS — I607 Nontraumatic subarachnoid hemorrhage from unspecified intracranial artery: Secondary | ICD-10-CM | POA: Diagnosis not present

## 2016-04-12 DIAGNOSIS — I2510 Atherosclerotic heart disease of native coronary artery without angina pectoris: Secondary | ICD-10-CM | POA: Diagnosis not present

## 2016-04-12 NOTE — Patient Outreach (Signed)
[facility]) Care Management  [facility] CM Routine Home Visit for Discharge 04/12/2016  Ashtin Rosner [birth year] [id number]  "Demarquez" Kanner is an 82 y.o. male followed by [facility] for transition of care after recent IP hospital visit June 27-February 28, 2016 for [facility]; patient went to IP rehab after acute hospital visit, where he experienced STEMIand was re-admitted to acute IP hospital. Pt. has history of chronic AF, AS, HTN, and HLD. Mr. Schetter was discharged home on February 28, 2016 with [facility]) services for nursing and PT.  Mosi completed transition of care without hospital readmission and is now followed by [facility] for reinforcement of self-health management of chronic disease state of HTN.  Today, Ember continues to report that he is doing "great" since his discharge home from the hospital.  Pacer reports today that he has been actively working with home health [facility]) services for PT and RN, and states that [facility] PT "stopped a few weeks ago," and last [facility] RN visit is scheduled for later today.    Harvis reports that he has and is taking all of his medications as prescribed; verbalizes good understanding of purpose, scheduling and dosages of all medications.  However, Vikas reports that he has independently "decreased his Eliquis, to once daily" dose, as he believes he has had intermittent "blood" in his urine, which was previously reported to this [facility] RN CM, and then relayed to patient's PCP.  Saurav states that his urine "occasionally appears a little dark, like it might be blood," and he describes an incident several years ago where this happened and he was sent to the urologist, which he vehemently voices today that he does not "want to do again."  Kayon reports that there is no blood in his urine today.  I advised Kairo that he should take his medications as they are prescribed, and that he should reach out to his medical providers if he believes there is  a possibility that blood is in his urine.  I also let Fed know that I would be making his PCP aware of his reports today via secure messaging through EMR, as Kelvon flatly denies my offer to contact his providers by phone to report this, stating, "I will call them myself if I think it is a problem."  Ayad states that he has an "early October" appointment with his PCP, and he assures me that he will discuss these issues with his PCP.  We discussed signs and symptoms of blood loss, as well as concerning symptoms to report such as light-headedness, dizziness, blood in stool, bleeding gums, etc.  We also thoroughly discussed purpose of anticoagulation.  Medication reconciliation performed today during visit/ see notes from medication review.  Today, we reviewed patient's recorded BP logs.  Jaideep has been monitoring and recording his BP's at least once a day, and his BP's have been ranging from 104-168/52-82.    Garan voices today that he believes he is ready for discharge from [facility] program, and we discussed together that he has successfully met his [facility] CM goals. I made sure patient had my direct phone number, the main number to [facility], and the 24-hour nurse line, should he wish for [facility] CM services to become re-involved with his care in the future.  Patient denies further needs, concerns, questions, or problems today.   Subjective: "I am doing well; I am looking forward to going to [place] soon."  Objective:    BP  134/68   Pulse 68   Resp 18   SpO2 97%    Review of Systems  Constitutional: Negative.  Negative for fever, malaise/fatigue and weight loss.  Respiratory: Negative.  Negative for cough, shortness of breath and wheezing.   Cardiovascular: Negative.  Negative for chest pain and leg swelling.  Gastrointestinal: Negative.  Negative for abdominal pain, nausea and vomiting.  Genitourinary: Positive for frequency and hematuria. Negative for dysuria.       Patient reports bladder  spasms and "occasional" blood in urine; denies today; states urine is "a little dark," but insists there is no blood present in urine  Musculoskeletal: Negative for falls and myalgias.  Neurological: Negative for dizziness and weakness.  Psychiatric/Behavioral: Negative for depression and memory loss. The patient is not nervous/anxious.     Physical Exam  Constitutional: He is oriented to person, place, and time. He appears well-developed and well-nourished. No distress.  Cardiovascular: Normal rate, normal heart sounds and intact distal pulses.  An irregular rhythm present.  Pulses:      Radial pulses are 2+ on the right side, and 2+ on the left side.  Respiratory: Effort normal and breath sounds normal. No respiratory distress. He has no wheezes. He has no rales.  GI: Soft. Bowel sounds are normal.  Musculoskeletal: He exhibits no edema.  Neurological: He is alert and oriented to person, place, and time.  Skin: Skin is warm and dry.  Psychiatric: He has a normal mood and affect. His behavior is normal. Judgment and thought content normal.    Encounter Medications:   Outpatient Encounter Prescriptions as of 04/12/2016  Medication Sig Note  . apixaban (ELIQUIS) 2.5 MG TABS tablet Take 1 tablet (2.5 mg total) by mouth 2 (two) times daily. 04/12/2016: Taking differently-- taking ONE QD  . atorvastatin (LIPITOR) 80 MG tablet Take 1 tablet (80 mg total) by mouth daily at 6 PM. 03/15/2016: Patient reports taking one half to one quarter of a pill daily, patient states that he has discussed this with his PCP  . clopidogrel (PLAVIX) 75 MG tablet Take 1 tablet (75 mg total) by mouth daily with breakfast.   . famotidine (PEPCID) 20 MG tablet Take 1 tablet (20 mg total) by mouth 2 (two) times daily.   . flavoxATE (URISPAS) 100 MG tablet Take 1 tablet (100 mg total) by mouth 3 (three) times daily as needed for bladder spasms.   . Multiple Vitamin (MULTIVITAMIN) capsule Take 1 capsule by mouth daily.     .  Phosphatidylserine-DHA-EPA (VAYACOG) 100-19.5-6.5 MG CAPS Take 1 capsule by mouth daily.    No facility-administered encounter medications on file as of 04/12/2016.     Functional Status:   In your present state of health, do you have any difficulty performing the following activities: 03/08/2016 03/01/2016  Hearing? - Y  Vision? - N  Difficulty concentrating or making decisions? - N  Walking or climbing stairs? - N  Dressing or bathing? - N  Doing errands, shopping? - N  Conservation officer, nature and eating ? N -  Using the Toilet? N -  In the past six months, have you accidently leaked urine? N -  Do you have problems with loss of bowel control? N -  Managing your Medications? N -  Managing your Finances? N -  Housekeeping or managing your Housekeeping? N -  Some recent data might be hidden    Fall/Depression Screening:    PHQ 2/9 Scores 03/15/2016 01/04/2015  PHQ - 2 Score 0 0  Assessment:  Tavaughn continues to do well in his recuperation since his discharge home from the hospital and he denies current needs.  Dawud has a good understanding of his medications, and generally takes his medications as they are prescribed, although he has recently independently decreased his dosing of anticoagulant Eliquis.  Burnham verbalizes that he understands the need to notify his providers when he is not taking his medication as they are prescribed, and to contact his providers if he has concerns, which he has agreed to do today.   Bart attends his provider appointments as scheduled, and has actively worked with [facility] services, which will be completed this afternoon.  Nahiem verbalizes that he believes he is ready for discharge from [facility] program today.    Plan:   Will discharge Bertel from [place], as he has successfully met his previously established goals.  It has been a pleasure meeting and caring for Leomar,    [name], RN, BSN, [employer] Coordinator [facility]  Care Management  [phone]

## 2016-04-27 ENCOUNTER — Telehealth: Payer: Self-pay | Admitting: Cardiovascular Disease

## 2016-04-27 MED ORDER — APIXABAN 2.5 MG PO TABS: 2.5000 mg | ORAL_TABLET | Freq: Two times a day (BID) | ORAL | 2 refills | Status: DC

## 2016-04-27 MED ORDER — ATORVASTATIN CALCIUM 80 MG PO TABS: 80.0000 mg | ORAL_TABLET | Freq: Every day | ORAL | 2 refills | Status: DC

## 2016-04-27 MED ORDER — CLOPIDOGREL BISULFATE 75 MG PO TABS: 75.0000 mg | ORAL_TABLET | Freq: Every day | ORAL | 2 refills | Status: DC

## 2016-04-27 NOTE — Telephone Encounter (Signed)
New Message   Pt call requesting to speak with RN. Pt [facility] Please call back to discuss

## 2016-05-04 ENCOUNTER — Encounter (HOSPITAL_COMMUNITY): Payer: Self-pay | Admitting: Neurosurgery

## 2016-05-08 ENCOUNTER — Ambulatory Visit (INDEPENDENT_AMBULATORY_CARE_PROVIDER_SITE_OTHER): Payer: Medicare Other | Admitting: Internal Medicine

## 2016-05-08 ENCOUNTER — Encounter: Payer: Self-pay | Admitting: Internal Medicine

## 2016-05-08 ENCOUNTER — Other Ambulatory Visit (INDEPENDENT_AMBULATORY_CARE_PROVIDER_SITE_OTHER): Payer: Medicare Other

## 2016-05-08 DIAGNOSIS — D62 Acute posthemorrhagic anemia: Secondary | ICD-10-CM

## 2016-05-08 DIAGNOSIS — R310 Gross hematuria: Secondary | ICD-10-CM | POA: Diagnosis not present

## 2016-05-08 DIAGNOSIS — I10 Essential (primary) hypertension: Secondary | ICD-10-CM | POA: Diagnosis not present

## 2016-05-08 DIAGNOSIS — I69319 Unspecified symptoms and signs involving cognitive functions following cerebral infarction: Secondary | ICD-10-CM | POA: Diagnosis not present

## 2016-05-08 DIAGNOSIS — I2121 ST elevation (STEMI) myocardial infarction involving left circumflex coronary artery: Secondary | ICD-10-CM

## 2016-05-08 DIAGNOSIS — R319 Hematuria, unspecified: Secondary | ICD-10-CM | POA: Insufficient documentation

## 2016-05-08 LAB — CBC WITH DIFFERENTIAL/PLATELET
BASOS PCT: 0.3 % (ref 0.0–3.0)
Basophils Absolute: 0 10*3/uL (ref 0.0–0.1)
EOS PCT: 2.2 % (ref 0.0–5.0)
Eosinophils Absolute: 0.2 10*3/uL (ref 0.0–0.7)
HEMATOCRIT: 40.3 % (ref 39.0–52.0)
Hemoglobin: 13.8 g/dL (ref 13.0–17.0)
LYMPHS ABS: 2.2 10*3/uL (ref 0.7–4.0)
LYMPHS PCT: 30.7 % (ref 12.0–46.0)
MCHC: 34.3 g/dL (ref 30.0–36.0)
MCV: 92.5 fl (ref 78.0–100.0)
MONOS PCT: 11 % (ref 3.0–12.0)
Monocytes Absolute: 0.8 10*3/uL (ref 0.1–1.0)
NEUTROS ABS: 4.1 10*3/uL (ref 1.4–7.7)
NEUTROS PCT: 55.8 % (ref 43.0–77.0)
PLATELETS: 231 10*3/uL (ref 150.0–400.0)
RBC: 4.36 Mil/uL (ref 4.22–5.81)
RDW: 14.4 % (ref 11.5–15.5)
WBC: 7.3 10*3/uL (ref 4.0–10.5)

## 2016-05-08 LAB — BASIC METABOLIC PANEL
BUN: 11 mg/dL (ref 6–23)
CALCIUM: 9.1 mg/dL (ref 8.4–10.5)
CO2: 28 mEq/L (ref 19–32)
CREATININE: 0.86 mg/dL (ref 0.40–1.50)
Chloride: 105 mEq/L (ref 96–112)
GFR: 89.54 mL/min (ref >60.00)
Glucose, Bld: 95 mg/dL (ref 70–99)
Potassium: 4.5 mEq/L (ref 3.5–5.1)
Sodium: 140 mEq/L (ref 135–145)

## 2016-05-08 MED ORDER — FLAVOXATE HCL 100 MG PO TABS: 100.0000 mg | ORAL_TABLET | Freq: Three times a day (TID) | ORAL | 3 refills | Status: DC | PRN

## 2016-05-08 NOTE — Progress Notes (Signed)
Pre visit review using our clinic review tool, if applicable. No additional management support is needed unless otherwise documented below in the visit note. 

## 2016-05-08 NOTE — Progress Notes (Signed)
Subjective:  Patient ID: Jason Hawkins, male    DOB: [birth year]  Age: 82 y.o. MRN: [id number]  CC: No chief complaint on file.   HPI Marcus Bouder presents for HTN, subarachnoid hemorrhage. He did have blood in the urine for a few days and stopped Eliquis x 2-3 d He is leaving for [place].  Outpatient Medications Prior to Visit  Medication Sig Dispense Refill  . apixaban (ELIQUIS) 2.5 MG TABS tablet Take 1 tablet (2.5 mg total) by mouth 2 (two) times daily. (Patient taking differently: Take 2.5 mg by mouth daily. ) 180 tablet 2  . atorvastatin (LIPITOR) 80 MG tablet Take 1 tablet (80 mg total) by mouth daily at 6 PM. 90 tablet 2  . clopidogrel (PLAVIX) 75 MG tablet Take 1 tablet (75 mg total) by mouth daily with breakfast. 90 tablet 2  . famotidine (PEPCID) 20 MG tablet Take 1 tablet (20 mg total) by mouth 2 (two) times daily. 60 tablet 6  . flavoxATE (URISPAS) 100 MG tablet Take 1 tablet (100 mg total) by mouth 3 (three) times daily as needed for bladder spasms. 30 tablet 2  . Multiple Vitamin (MULTIVITAMIN) capsule Take 1 capsule by mouth daily.      . Phosphatidylserine-DHA-EPA (VAYACOG) 100-19.5-6.5 MG CAPS Take 1 capsule by mouth daily. (Patient not taking: Reported on 05/08/2016) 30 capsule 11   No facility-administered medications prior to visit.     ROS Review of Systems  Constitutional: Negative for appetite change, fatigue and unexpected weight change.  HENT: Negative for congestion, nosebleeds, sneezing, sore throat and trouble swallowing.   Eyes: Negative for itching and visual disturbance.  Respiratory: Negative for cough.   Cardiovascular: Negative for chest pain, palpitations and leg swelling.  Gastrointestinal: Negative for abdominal distention, blood in stool, diarrhea and nausea.  Genitourinary: Positive for hematuria. Negative for decreased urine volume and frequency.  Musculoskeletal: Negative for back pain, gait problem, joint swelling and neck pain.  Skin:  Negative for rash.  Neurological: Negative for dizziness, tremors, speech difficulty and weakness.  Psychiatric/Behavioral: Positive for decreased concentration. Negative for agitation, dysphoric mood, sleep disturbance and suicidal ideas. The patient is not nervous/anxious.     Objective:  BP (!) 170/98   Pulse 73   Wt 178 lb (80.7 kg)   SpO2 97%   BMI 25.91 kg/m   BP Readings from Last 3 Encounters:  05/08/16 (!) 170/98  04/12/16 134/68  03/27/16 132/70    Wt Readings from Last 3 Encounters:  05/08/16 178 lb (80.7 kg)  03/27/16 177 lb 8 oz (80.5 kg)  03/13/16 176 lb (79.8 kg)    Physical Exam  Constitutional: He is oriented to person, place, and time. He appears well-developed. No distress.  NAD  HENT:  Mouth/Throat: Oropharynx is clear and moist.  Eyes: Conjunctivae are normal. Pupils are equal, round, and reactive to light.  Neck: Normal range of motion. No JVD present. No thyromegaly present.  Cardiovascular: Normal rate, regular rhythm, normal heart sounds and intact distal pulses.  Exam reveals no gallop and no friction rub.   No murmur heard. Pulmonary/Chest: Effort normal and breath sounds normal. No respiratory distress. He has no wheezes. He has no rales. He exhibits no tenderness.  Abdominal: Soft. Bowel sounds are normal. He exhibits no distension and no mass. There is no tenderness. There is no rebound and no guarding.  Musculoskeletal: Normal range of motion. He exhibits no edema or tenderness.  Lymphadenopathy:    He has no cervical adenopathy.  Neurological:  He is alert and oriented to person, place, and time. He has normal reflexes. No cranial nerve deficit. He exhibits normal muscle tone. He displays a negative Romberg sign. Coordination and gait normal.  Skin: Skin is warm and dry. No rash noted.  Psychiatric: He has a normal mood and affect. His behavior is normal. Judgment and thought content normal.  Romberg (-)  Lab Results  Component Value Date    WBC 8.6 02/27/2016   HGB 12.6 (L) 02/27/2016   HCT 37.9 (L) 02/27/2016   PLT 197 02/27/2016   GLUCOSE 99 02/27/2016   CHOL 157 02/16/2016   TRIG 57 02/16/2016   HDL 46 02/16/2016   LDLCALC 100 (H) 02/16/2016   ALT 34 02/24/2016   AST 28 02/24/2016   NA 137 02/27/2016   K 4.0 02/27/2016   CL 106 02/27/2016   CREATININE 0.76 02/27/2016   BUN 14 02/27/2016   CO2 22 02/27/2016   TSH 1.52 01/04/2016   PSA 0.22 01/04/2016   INR 1.28 02/03/2016   HGBA1C 5.6 02/16/2016    No results found.  Assessment & Plan:   There are no diagnoses linked to this encounter. I am having Mr. Leese maintain his multivitamin, famotidine, Phosphatidylserine-DHA-EPA, flavoxATE, apixaban, atorvastatin, and clopidogrel.  No orders of the defined types were placed in this encounter.    Follow-up: No Follow-up on file.  Walker Kehr, MD

## 2016-05-08 NOTE — Assessment & Plan Note (Signed)
Recurrent  S/p Urology w/up Stop Plavix if hematuria re-occurs

## 2016-05-08 NOTE — Assessment & Plan Note (Addendum)
Better on Vayacog

## 2016-05-08 NOTE — Patient Instructions (Addendum)
Stop Plavix if hematuria re-occurs  Start Toprol if BP>140 or higher See your doctor in [place] in 2-3 months

## 2016-05-08 NOTE — Assessment & Plan Note (Signed)
NAS diet He has Toprol but not taking it - re-start if SBP>140 Risks associated with chronic BP treatment noncompliance were discussed. Compliance was encouraged.  States SBP is 140 at home

## 2016-05-08 NOTE — Assessment & Plan Note (Signed)
Labs

## 2016-07-10 ENCOUNTER — Encounter (HOSPITAL_COMMUNITY): Payer: Self-pay | Admitting: *Deleted

## 2016-07-25 ENCOUNTER — Other Ambulatory Visit: Payer: Self-pay | Admitting: *Deleted

## 2016-07-25 MED ORDER — FLAVOXATE HCL 100 MG PO TABS: 100.0000 mg | ORAL_TABLET | Freq: Three times a day (TID) | ORAL | 0 refills | Status: DC | PRN

## 2016-11-07 ENCOUNTER — Other Ambulatory Visit: Payer: Self-pay | Admitting: Cardiovascular Disease

## 2016-11-07 MED ORDER — CLOPIDOGREL BISULFATE 75 MG PO TABS: 75.0000 mg | ORAL_TABLET | Freq: Every day | ORAL | 0 refills | Status: DC

## 2016-11-07 MED ORDER — APIXABAN 2.5 MG PO TABS: 2.5000 mg | ORAL_TABLET | Freq: Two times a day (BID) | ORAL | 1 refills | Status: DC

## 2016-11-07 NOTE — Telephone Encounter (Signed)
New Message ° ° pt verbalized that he is returning call for rn  °

## 2016-11-07 NOTE — Telephone Encounter (Signed)
According to the appointment screen, patient was called to schedule an appointment after receiving a recall letter.

## 2016-11-07 NOTE — Telephone Encounter (Signed)
Pt's Rx was sent to pt's pharmacy as requested. Confirmation received.  °

## 2016-11-07 NOTE — Telephone Encounter (Signed)
Follow Up:    Pt said he was returning Dr Kyla Balzarine nurse call from about 3 days ago. He did not know what the call was about.

## 2016-11-07 NOTE — Telephone Encounter (Signed)
Telephoned pt and discussed specified criteria for Eliquis and appropriate dosage he should be on 5mg  BID and he states he has been taking 2.5mg s BID and occ QD. Pt states he has done well on his current dosage and does not want to increase dosage. Explained the rational for wanting to increase dosage to prevent thrombus formation, he refused. Last OV with Dr Johnsie Cancel was 03/27/16, Age 83 yrs old, serum creatine 05/08/16 0.86, weight 05/08/16 80.8Kg. 2.5mg  tablets refilled per pts request.

## 2016-11-07 NOTE — Telephone Encounter (Signed)
New Message     *STAT* If patient is at the pharmacy, call can be transferred to refill team.   1. Which medications need to be refilled? (please list name of each medication and dose if known) eliquis & plavix  2. Which pharmacy/location (including street and city if local pharmacy) [facility]   3. Do they need a 30 day or 90 day supply? [place]

## 2016-11-14 ENCOUNTER — Telehealth: Payer: Self-pay

## 2016-11-14 NOTE — Telephone Encounter (Signed)
**Note De-Identified Haywood Meinders Obfuscation** We received a form from [facility] concerning a PA for the pts Eliquis. Questions were answered and Dr Johnsie Cancel has signed the form. Form faxed to [facility]. Awaiting response.

## 2016-11-15 NOTE — Telephone Encounter (Signed)
**Note De-Identified Florian Chauca Obfuscation** Received PA approval on the pts Eliquis from [facility]. Approval good from 10/15/16 through 11/14/2017.

## 2016-11-27 ENCOUNTER — Ambulatory Visit: Payer: Self-pay | Admitting: Pharmacist

## 2016-11-27 DIAGNOSIS — Z952 Presence of prosthetic heart valve: Secondary | ICD-10-CM

## 2016-11-27 DIAGNOSIS — Z7901 Long term (current) use of anticoagulants: Secondary | ICD-10-CM

## 2016-12-27 ENCOUNTER — Telehealth: Payer: Self-pay | Admitting: Internal Medicine

## 2016-12-27 NOTE — Telephone Encounter (Signed)
Spoke with patient regarding OV scheduled for 01/02/2017. Informed patient that he scheduled CPE last year for 01/07/2017. Also explained that CPE will need to be changed to OV due to ins. Pt stated understanding and also stated that he wanted to cx the appt on 01/02/2017. Cancelled appt for 01/02/2017 and changed visit type for 01/07/2017.

## 2017-01-02 ENCOUNTER — Ambulatory Visit: Payer: Self-pay | Admitting: Internal Medicine

## 2017-01-07 ENCOUNTER — Encounter: Payer: Self-pay | Admitting: Internal Medicine

## 2017-01-07 ENCOUNTER — Other Ambulatory Visit (INDEPENDENT_AMBULATORY_CARE_PROVIDER_SITE_OTHER): Payer: Medicare Other

## 2017-01-07 ENCOUNTER — Ambulatory Visit (INDEPENDENT_AMBULATORY_CARE_PROVIDER_SITE_OTHER): Payer: Medicare Other | Admitting: Internal Medicine

## 2017-01-07 VITALS — BP 138/68 | HR 63 | Temp 98.5°F | Ht 69.5 in | Wt 183.0 lb

## 2017-01-07 DIAGNOSIS — I482 Chronic atrial fibrillation, unspecified: Secondary | ICD-10-CM

## 2017-01-07 DIAGNOSIS — R609 Edema, unspecified: Secondary | ICD-10-CM | POA: Diagnosis not present

## 2017-01-07 DIAGNOSIS — Z955 Presence of coronary angioplasty implant and graft: Secondary | ICD-10-CM | POA: Diagnosis not present

## 2017-01-07 DIAGNOSIS — Z0001 Encounter for general adult medical examination with abnormal findings: Secondary | ICD-10-CM | POA: Diagnosis not present

## 2017-01-07 DIAGNOSIS — Z952 Presence of prosthetic heart valve: Secondary | ICD-10-CM

## 2017-01-07 DIAGNOSIS — J0100 Acute maxillary sinusitis, unspecified: Secondary | ICD-10-CM | POA: Diagnosis not present

## 2017-01-07 DIAGNOSIS — E785 Hyperlipidemia, unspecified: Secondary | ICD-10-CM | POA: Diagnosis not present

## 2017-01-07 DIAGNOSIS — I10 Essential (primary) hypertension: Secondary | ICD-10-CM

## 2017-01-07 DIAGNOSIS — N320 Bladder-neck obstruction: Secondary | ICD-10-CM

## 2017-01-07 DIAGNOSIS — Z7901 Long term (current) use of anticoagulants: Secondary | ICD-10-CM | POA: Diagnosis not present

## 2017-01-07 DIAGNOSIS — Z0000 Encounter for general adult medical examination without abnormal findings: Secondary | ICD-10-CM

## 2017-01-07 LAB — URINALYSIS, ROUTINE W REFLEX MICROSCOPIC
BILIRUBIN URINE: NEGATIVE
KETONES UR: NEGATIVE
NITRITE: NEGATIVE
PH: 6 (ref 5.0–8.0)
Specific Gravity, Urine: 1.015 (ref 1.000–1.030)
URINE GLUCOSE: NEGATIVE
Urobilinogen, UA: 0.2 (ref 0.0–1.0)

## 2017-01-07 LAB — CBC WITH DIFFERENTIAL/PLATELET
BASOS ABS: 0 10*3/uL (ref 0.0–0.1)
Basophils Relative: 0.3 % (ref 0.0–3.0)
EOS ABS: 0.3 10*3/uL (ref 0.0–0.7)
Eosinophils Relative: 3.3 % (ref 0.0–5.0)
HCT: 42.9 % (ref 39.0–52.0)
Hemoglobin: 14.6 g/dL (ref 13.0–17.0)
LYMPHS ABS: 1.9 10*3/uL (ref 0.7–4.0)
LYMPHS PCT: 24.2 % (ref 12.0–46.0)
MCHC: 33.9 g/dL (ref 30.0–36.0)
MCV: 94.3 fl (ref 78.0–100.0)
MONO ABS: 0.9 10*3/uL (ref 0.1–1.0)
Monocytes Relative: 11.1 % (ref 3.0–12.0)
NEUTROS PCT: 61.1 % (ref 43.0–77.0)
Neutro Abs: 4.7 10*3/uL (ref 1.4–7.7)
Platelets: 191 10*3/uL (ref 150.0–400.0)
RBC: 4.55 Mil/uL (ref 4.22–5.81)
RDW: 14 % (ref 11.5–15.5)
WBC: 7.7 10*3/uL (ref 4.0–10.5)

## 2017-01-07 LAB — LIPID PANEL
CHOL/HDL RATIO: 4
Cholesterol: 182 mg/dL (ref 0–200)
HDL: 50.2 mg/dL (ref >39.00)
LDL Cholesterol: 109 mg/dL — ABNORMAL HIGH (ref 0–99)
NONHDL: 131.52
Triglycerides: 115 mg/dL (ref 0.0–149.0)
VLDL: 23 mg/dL (ref 0.0–40.0)

## 2017-01-07 LAB — BASIC METABOLIC PANEL
BUN: 14 mg/dL (ref 6–23)
CALCIUM: 9.4 mg/dL (ref 8.4–10.5)
CO2: 27 meq/L (ref 19–32)
CREATININE: 0.92 mg/dL (ref 0.40–1.50)
Chloride: 103 mEq/L (ref 96–112)
GFR: 82.71 mL/min (ref >60.00)
GLUCOSE: 88 mg/dL (ref 70–99)
Potassium: 4.1 mEq/L (ref 3.5–5.1)
Sodium: 141 mEq/L (ref 135–145)

## 2017-01-07 LAB — HEPATIC FUNCTION PANEL
ALK PHOS: 64 U/L (ref 39–117)
ALT: 10 U/L (ref 0–53)
AST: 17 U/L (ref 0–37)
Albumin: 4.4 g/dL (ref 3.5–5.2)
BILIRUBIN DIRECT: 0.2 mg/dL (ref 0.0–0.3)
Total Bilirubin: 0.9 mg/dL (ref 0.2–1.2)
Total Protein: 7.1 g/dL (ref 6.0–8.3)

## 2017-01-07 LAB — TSH: TSH: 2.4 u[IU]/mL (ref 0.35–4.50)

## 2017-01-07 LAB — PSA: PSA: 0.24 ng/mL (ref 0.10–4.00)

## 2017-01-07 MED ORDER — CEFUROXIME AXETIL 250 MG PO TABS: 250.0000 mg | ORAL_TABLET | Freq: Two times a day (BID) | ORAL | 0 refills | Status: AC

## 2017-01-07 NOTE — Assessment & Plan Note (Signed)
NAS diet 

## 2017-01-07 NOTE — Progress Notes (Signed)
Subjective:  Patient ID: Camryn Quesinberry, male    DOB: September 22, 1927  Age: 83 y.o. MRN: [id number]  CC: No chief complaint on file.   HPI Naveen Clardy presents for a [facility] well exam. C/o sinusitis sx's x weeks. F/u HTN, dyslipidemia, CAD. 1 son died of prostate ca last year...  Outpatient Medications Prior to Visit  Medication Sig Dispense Refill  . apixaban (ELIQUIS) 2.5 MG TABS tablet Take 1 tablet (2.5 mg total) by mouth 2 (two) times daily. 180 tablet 1  . atorvastatin (LIPITOR) 80 MG tablet Take 1 tablet (80 mg total) by mouth daily at 6 PM. 90 tablet 2  . clopidogrel (PLAVIX) 75 MG tablet Take 1 tablet (75 mg total) by mouth daily with breakfast. 90 tablet 0  . flavoxATE (URISPAS) 100 MG tablet Take 1 tablet (100 mg total) by mouth 3 (three) times daily as needed for bladder spasms. 270 tablet 0  . Multiple Vitamin (MULTIVITAMIN) capsule Take 1 capsule by mouth daily.      . famotidine (PEPCID) 20 MG tablet Take 1 tablet (20 mg total) by mouth 2 (two) times daily. 60 tablet 6  . Phosphatidylserine-DHA-EPA (VAYACOG) 100-19.5-6.5 MG CAPS Take 1 capsule by mouth daily. (Patient not taking: Reported on 05/08/2016) 30 capsule 11   No facility-administered medications prior to visit.     ROS Review of Systems  Constitutional: Negative for appetite change, fatigue and unexpected weight change.  HENT: Positive for congestion and sinus pressure. Negative for nosebleeds, sneezing, sore throat and trouble swallowing.   Eyes: Negative for itching and visual disturbance.  Respiratory: Negative for cough.   Cardiovascular: Negative for chest pain, palpitations and leg swelling.  Gastrointestinal: Negative for abdominal distention, blood in stool, diarrhea and nausea.  Genitourinary: Positive for frequency and urgency. Negative for hematuria.  Musculoskeletal: Positive for arthralgias. Negative for back pain, gait problem, joint swelling and neck pain.  Skin: Negative for rash.  Neurological:  Negative for dizziness, tremors, speech difficulty and weakness.  Hematological: Bruises/bleeds easily.  Psychiatric/Behavioral: Negative for agitation, dysphoric mood, sleep disturbance and suicidal ideas. The patient is not nervous/anxious.     Objective:  BP 138/68 (BP Location: Left Arm, Patient Position: Sitting, Cuff Size: Normal)   Pulse 63   Temp 98.5 F (36.9 C) (Oral)   Ht 5' 9.5" (1.765 m)   Wt 183 lb (83 kg)   SpO2 98%   BMI 26.64 kg/m   BP Readings from Last 3 Encounters:  01/07/17 138/68  05/08/16 (!) 170/98  04/12/16 134/68    Wt Readings from Last 3 Encounters:  01/07/17 183 lb (83 kg)  05/08/16 178 lb (80.7 kg)  03/27/16 177 lb 8 oz (80.5 kg)    Physical Exam  Constitutional: He is oriented to person, place, and time. He appears well-developed. No distress.  NAD  HENT:  Mouth/Throat: Oropharynx is clear and moist.  Eyes: Conjunctivae are normal. Pupils are equal, round, and reactive to light.  Neck: Normal range of motion. No JVD present. No thyromegaly present.  Cardiovascular: Normal rate, regular rhythm and intact distal pulses.  Exam reveals no gallop and no friction rub.   Murmur heard. Pulmonary/Chest: Effort normal and breath sounds normal. No respiratory distress. He has no wheezes. He has no rales. He exhibits no tenderness.  Abdominal: Soft. Bowel sounds are normal. He exhibits no distension and no mass. There is no tenderness. There is no rebound and no guarding.  Musculoskeletal: Normal range of motion. He exhibits edema. He exhibits no  tenderness.  Lymphadenopathy:    He has no cervical adenopathy.  Neurological: He is alert and oriented to person, place, and time. He has normal reflexes. No cranial nerve deficit. He exhibits normal muscle tone. He displays a negative Romberg sign. Coordination and gait normal.  Skin: Skin is warm and dry. No rash noted.  Psychiatric: He has a normal mood and affect. His behavior is normal. Judgment and  thought content normal.  pt declined a rectal exam eryth nasal mucosa  Lab Results  Component Value Date   WBC 7.3 05/08/2016   HGB 13.8 05/08/2016   HCT 40.3 05/08/2016   PLT 231.0 05/08/2016   GLUCOSE 95 05/08/2016   CHOL 157 02/16/2016   TRIG 57 02/16/2016   HDL 46 02/16/2016   LDLCALC 100 (H) 02/16/2016   ALT 34 02/24/2016   AST 28 02/24/2016   NA 140 05/08/2016   K 4.5 05/08/2016   CL 105 05/08/2016   CREATININE 0.86 05/08/2016   BUN 11 05/08/2016   CO2 28 05/08/2016   TSH 1.52 01/04/2016   PSA 0.22 01/04/2016   INR 1.28 02/03/2016   HGBA1C 5.6 02/16/2016    No results found.  Assessment & Plan:   There are no diagnoses linked to this encounter. I have discontinued Mr. Welton famotidine and Phosphatidylserine-DHA-EPA. I am also having him maintain his multivitamin, atorvastatin, flavoxATE, clopidogrel, apixaban, Coenzyme Q10 (CO Q 10 PO), Omega-3 Fatty Acids (FISH OIL PO), and B Complex Vitamins (VITAMIN B COMPLEX PO).  Meds ordered this encounter  Medications  . Coenzyme Q10 (CO Q 10 PO)    Sig: Take by mouth.  . Omega-3 Fatty Acids (FISH OIL PO)    Sig: Take by mouth.  . B Complex Vitamins (VITAMIN B COMPLEX PO)    Sig: Take by mouth.     Follow-up: No Follow-up on file.  Walker Kehr, MD

## 2017-01-07 NOTE — Assessment & Plan Note (Signed)
Po abx - see Rx

## 2017-01-07 NOTE — Assessment & Plan Note (Addendum)
Here for medicare wellness/physical  Diet: heart healthy  Physical activity: not sedentary  Depression/mood screen: negative  Hearing: intact to whispered voice  Visual acuity: grossly normal w/glasses, performs annual eye exam  ADLs: capable  Fall risk: low to none  Home safety: good  Cognitive evaluation: intact to orientation, naming, recall and repetition  EOL planning: adv directives, full code/ I agree  I have personally reviewed and have noted  1. The patient's medical, surgical and social history  2. Their use of alcohol, tobacco or illicit drugs  3. Their current medications and supplements  4. The patient's functional ability including ADL's, fall risks, home safety risks and hearing or visual impairment.  5. Diet and physical activities  6. Evidence for depression or mood disorders 7. The roster of all physicians providing medical care to patient - is listed in the Snapshot section of the chart and reviewed today.    Today patient counseled on age appropriate routine health concerns for screening and prevention, each reviewed and up to date or declined. Immunizations reviewed and up to date or declined. Labs ordered and reviewed. Risk factors for depression reviewed and negative. Hearing function and visual acuity are intact. ADLs screened and addressed as needed. Functional ability and level of safety reviewed and appropriate. Education, counseling and referrals performed based on assessed risks today. Patient provided with a copy of personalized plan for preventive services.  Lives in [place] in winters Refused all vaccines Requested PSA

## 2017-01-07 NOTE — Patient Instructions (Signed)

## 2017-01-07 NOTE — Assessment & Plan Note (Signed)
On Eliquis

## 2017-01-07 NOTE — Assessment & Plan Note (Signed)
Eliquis 

## 2017-01-07 NOTE — Assessment & Plan Note (Signed)
On Plavix, Lipitor 

## 2017-01-07 NOTE — Assessment & Plan Note (Signed)
chronic

## 2017-01-07 NOTE — Assessment & Plan Note (Signed)
Dr Johnsie Cancel: appt is pending this month

## 2017-01-09 ENCOUNTER — Other Ambulatory Visit: Payer: Self-pay | Admitting: Internal Medicine

## 2017-01-09 DIAGNOSIS — N390 Urinary tract infection, site not specified: Secondary | ICD-10-CM

## 2017-01-18 ENCOUNTER — Telehealth: Payer: Self-pay | Admitting: Internal Medicine

## 2017-01-18 NOTE — Telephone Encounter (Signed)
Patient picked up this medication on 06/04. Patient received letter about lab results which mentioned medication.

## 2017-01-18 NOTE — Telephone Encounter (Signed)
Pt called stating that the medication for Ceftin was never called in. [facility] [address]. He would like a call when this has been done.

## 2017-01-19 ENCOUNTER — Encounter: Payer: Self-pay | Admitting: Internal Medicine

## 2017-01-22 ENCOUNTER — Encounter: Payer: Self-pay | Admitting: Internal Medicine

## 2017-01-22 DIAGNOSIS — N390 Urinary tract infection, site not specified: Secondary | ICD-10-CM

## 2017-01-23 ENCOUNTER — Telehealth: Payer: Self-pay | Admitting: Internal Medicine

## 2017-01-23 NOTE — Telephone Encounter (Signed)
Pt has some questions regarding his medication. Can you please give pt a call asap

## 2017-01-23 NOTE — Telephone Encounter (Signed)
LMTCB

## 2017-01-28 ENCOUNTER — Ambulatory Visit
Admission: RE | Admit: 2017-01-28 | Discharge: 2017-01-28 | Disposition: A | Discharge status: Home / Self Care | Payer: Medicare Other | Source: Ambulatory Visit | Attending: Internal Medicine | Admitting: Internal Medicine

## 2017-01-28 DIAGNOSIS — N390 Urinary tract infection, site not specified: Secondary | ICD-10-CM | POA: Diagnosis not present

## 2017-01-28 NOTE — Progress Notes (Signed)
Patient ID: Jason Hawkins, male   DOB: 1928-04-18, 83 y.o.   MRN: [id number]   Colbe is seen today for F/U Still not same since wife died  They were married for over 39 years and she was a patient of ours as well. Bracy has a history of AVR and afib.  Had [facility] with coil embolization of artery on 02/01/16. Complicated by STEMI while in rehab. Coumadin changed to eliquis.  Had some hypotension at rehab and meds cut back.    Cath showed single vessel distal circumflex Dx Conclusion    Prox RCA to Mid RCA lesion, 30% stenosed.  Prox LAD to Mid LAD lesion, 30% stenosed.  Ost 1st Mrg to 1st Mrg lesion, 80% stenosed. This is a very small branch.  Dist Cx lesion, 100% stenosed. Post intervention, there is a 0% residual stenosis.   1. 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 2. Successful stenting of the distal LCx with DES   EF preserved by echo 02/17/15 60-65%   Got stung by bee yesterday and appears to have cellulitis in left arm   ROS: Denies fever, malais, weight loss, blurry vision, decreased visual acuity, cough, sputum, SOB, hemoptysis, pleuritic pain, palpitaitons, heartburn, abdominal pain, melena, lower extremity edema, claudication, or rash.  All other systems reviewed and negative  General: Affect appropriate Healthy:  appears stated age 42: normal Neck supple with no adenopathy JVP normal no bruits no thyromegaly Lungs clear with no wheezing and good diaphragmatic motion Heart:  Y7/C6 click SEM  murmur, no rub, gallop or click PMI normal Abdomen: benighn, BS positve, no tenderness, no AAA no bruit.  No HSM or HJR Distal pulses intact with no bruits Plus 2 bilateral  Edema with stasis  Neuro non-focal Cellulitis left arm initial sting in soft tissue dorsum of hand near thumb Erythema does not extend past elbow warm to touch No muscular weakness   Current Outpatient Prescriptions  Medication Sig Dispense Refill  . apixaban (ELIQUIS) 2.5 MG TABS tablet  Take 1 tablet (2.5 mg total) by mouth 2 (two) times daily. 180 tablet 1  . atorvastatin (LIPITOR) 80 MG tablet Take 1 tablet (80 mg total) by mouth daily at 6 PM. 90 tablet 2  . B Complex Vitamins (VITAMIN B COMPLEX PO) Take 1 tablet by mouth daily.     . clopidogrel (PLAVIX) 75 MG tablet Take 1 tablet (75 mg total) by mouth daily with breakfast. 90 tablet 0  . Coenzyme Q10 (CO Q 10 PO) Take 1 capsule by mouth daily.     . flavoxATE (URISPAS) 100 MG tablet Take 1 tablet (100 mg total) by mouth 3 (three) times daily as needed for bladder spasms. 270 tablet 0  . Multiple Vitamin (MULTIVITAMIN) capsule Take 1 capsule by mouth daily.      . Omega-3 Fatty Acids (FISH OIL PO) Take 1 capsule by mouth daily.     [name] doxycycline (VIBRAMYCIN) 100 MG capsule Take 1 capsule (100 mg total) by mouth 2 (two) times daily. 20 capsule 0   No current facility-administered medications for this visit.     Allergies  Bee venom; Codeine; and Lisinopril  Electrocardiogram:    04/28/14  afib rate 52  LAD  04/05/15  afib rate 59  Otherwise normal 01/29/17  Afib rate 61 otherwise normal   Assessment and Plan AVR: normal valve clicks SBE  No need for f/u echo Edema  Venous disease stable continue  PRN diuretic Anticoagulation   Chol:  Lab Results  Component Value Date   LDLCALC 109 (H) 01/07/2017   CAD:  Stable no angina continue plavix.  Stent to distal circumflex 02/16/16  SAH:  No residual neurologic deficits. Fortunate coiled aneurysm so hopefully risk of recurrence On blood thinners will be less  Cellulitis:  Started on doxycycline bid To ER for spread or fever. F/u Dr Laurian Brim next week  Jenkins Rouge

## 2017-01-29 ENCOUNTER — Ambulatory Visit (INDEPENDENT_AMBULATORY_CARE_PROVIDER_SITE_OTHER): Payer: Medicare Other | Admitting: Cardiovascular Disease

## 2017-01-29 ENCOUNTER — Encounter: Payer: Self-pay | Admitting: Cardiovascular Disease

## 2017-01-29 VITALS — BP 150/78 | HR 61 | Ht 69.5 in | Wt 179.4 lb

## 2017-01-29 DIAGNOSIS — I10 Essential (primary) hypertension: Secondary | ICD-10-CM

## 2017-01-29 DIAGNOSIS — I482 Chronic atrial fibrillation, unspecified: Secondary | ICD-10-CM

## 2017-01-29 MED ORDER — DOXYCYCLINE HYCLATE 100 MG PO CAPS: 100.0000 mg | ORAL_CAPSULE | Freq: Two times a day (BID) | ORAL | 0 refills | Status: DC

## 2017-01-29 NOTE — Patient Instructions (Addendum)
Medication Instructions:  Your physician has recommended you make the following change in your medication:  1-START Doxycycline 100 mg by mouth twice daily for 10 days   Labwork: NONE  Testing/Procedures: NONE  Follow-Up: Your physician wants you to follow-up in: 6 months with Dr. Johnsie Cancel. You will receive a reminder letter in the mail two months in advance. If you don't receive a letter, please call our office to schedule the follow-up appointment.  Please see your Primary Care Doctor, Dr. Alain Marion in the next week.   If you need a refill on your cardiac medications before your next appointment, please call your pharmacy.

## 2017-02-19 DIAGNOSIS — H25013 Cortical age-related cataract, bilateral: Secondary | ICD-10-CM | POA: Diagnosis not present

## 2017-02-19 DIAGNOSIS — H524 Presbyopia: Secondary | ICD-10-CM | POA: Diagnosis not present

## 2017-02-19 DIAGNOSIS — H2513 Age-related nuclear cataract, bilateral: Secondary | ICD-10-CM | POA: Diagnosis not present

## 2017-02-21 ENCOUNTER — Other Ambulatory Visit: Payer: Self-pay | Admitting: Cardiovascular Disease

## 2017-06-10 DIAGNOSIS — H6091 Unspecified otitis externa, right ear: Secondary | ICD-10-CM | POA: Diagnosis not present

## 2017-06-18 DIAGNOSIS — H6091 Unspecified otitis externa, right ear: Secondary | ICD-10-CM | POA: Diagnosis not present

## 2017-09-05 ENCOUNTER — Other Ambulatory Visit: Payer: Self-pay | Admitting: Cardiovascular Disease

## 2018-03-07 ENCOUNTER — Other Ambulatory Visit: Payer: Self-pay

## 2018-03-21 NOTE — Progress Notes (Deleted)
Subjective:   Jason Hawkins is a 84 y.o. male who presents for Medicare Annual/Subsequent preventive examination.  Review of Systems:  No ROS.  Medicare Wellness Visit. Additional risk factors are reflected in the social history.    Sleep patterns: {SX; SLEEP PATTERNS:18802::"feels rested on waking","does not get up to void","gets up *** times nightly to void","sleeps *** hours nightly"}.    Home Safety/Smoke Alarms: Feels safe in home. Smoke alarms in place.  Living environment; residence and Firearm Safety: {Rehab home environment / accessibility:30080::"no firearms","firearms stored safely"}. Seat Belt Safety/Bike Helmet: Wears seat belt.   PSA-  Lab Results  Component Value Date   PSA 0.24 01/07/2017   PSA 0.22 01/04/2016   PSA 0.24 01/04/2015       Objective:    Vitals: There were no vitals taken for this visit.  There is no height or weight on file to calculate BMI.  Advanced Directives 03/08/2016 02/25/2016 02/20/2016 02/13/2016 02/01/2016  Does Patient Have a Medical Advance Directive? Yes Yes Yes Yes Yes  Type of Paramedic of [place];Living will Living will;Healthcare Power of Attorney Living will;Healthcare Power of Attorney Living will;Healthcare Power of [place];Living will  Does patient want to make changes to medical advance directive? No - Patient declined - No - Patient declined - No - Patient declined  Copy of [place] in Chart? No - copy requested No - copy requested No - copy requested No - copy requested No - copy requested    Tobacco Social History   Tobacco Use  Smoking Status Former Smoker  Smokeless Tobacco Never Used     Counseling given: Not Answered  Past Medical History:  Diagnosis Date  . Bradycardia    a. prior to STEMI 02/2016 -> improved with stenting.  . Brain aneurysm    a. s/p coiling 01/2016.  [name] CAD (coronary artery disease)    a. STEMI 02/2016 while on rehab  s/p DES to Cx, EF 60-65%.  . Chronic atrial fibrillation ([place])   . HTN (hypertension)   . Hypercholesteremia   . Lower extremity edema   . Severe aortic stenosis    a. s/p aortic valve replacement using a pericardial tissue valve  . Subarachnoid bleed ([place])    a. due to brain aneurysm 01/2016.   Past Surgical History:  Procedure Laterality Date  . AORTIC VALVE REPLACEMENT  08/30/2003   Dr. Cyndia Bent  . CARDIAC CATHETERIZATION N/A 02/16/2016   Procedure: Left Heart Cath and Coronary Angiography;  Surgeon: Peter M Martinique, MD;  Location: [place] [facility];  Service: Cardiovascular;  Laterality: N/A;  . CARDIAC CATHETERIZATION N/A 02/16/2016   Procedure: Coronary Stent Intervention;  Surgeon: Peter M Martinique, MD;  Location: [name] [facility];  Service: Cardiovascular;  Laterality: N/A;  . IR GENERIC HISTORICAL  02/01/2016   IR TRANSCATH/EMBOLIZ 02/01/2016 [facility]  . IR GENERIC HISTORICAL  02/01/2016   IR ANGIOGRAM FOLLOW UP STUDY 02/01/2016 [facility]  . IR GENERIC HISTORICAL  02/01/2016   IR ANGIOGRAM FOLLOW UP STUDY 02/01/2016 [facility]  . IR GENERIC HISTORICAL  02/01/2016   IR ANGIO INTRA EXTRACRAN SEL INTERNAL CAROTID BILAT MOD SED 02/01/2016 [facility]  . IR GENERIC HISTORICAL  02/01/2016   IR NEURO EACH ADD'L AFTER BASIC UNI LEFT (MS) 02/01/2016 [facility]  . RADIOLOGY WITH ANESTHESIA N/A 02/01/2016   Procedure: RADIOLOGY WITH ANESTHESIA;  Surgeon: Consuella Lose, MD;  Location: [place];  Service: Radiology;  Laterality: N/A;  Family History  Problem Relation Age of Onset  . Diabetes Mother   . Heart disease Father   . Heart attack Father   . Heart attack Unknown   . Diabetes Unknown   . Cancer Son 66       prostate ca   Social History   Socioeconomic History  . Marital status: Widowed    Spouse name: Not on file  . Number of children: Not on file  . Years of education: Not on file  . Highest education level: Not on file  Occupational History  . Not on file    Social Needs  . Financial resource strain: Not on file  . Food insecurity:    Worry: Not on file    Inability: Not on file  . Transportation needs:    Medical: Not on file    Non-medical: Not on file  Tobacco Use  . Smoking status: Former Research scientist (life sciences)  . Smokeless tobacco: Never Used  Substance and Sexual Activity  . Alcohol use: Yes    Alcohol/week: 14.0 standard drinks    Types: 14 Cans of beer per week  . Drug use: No  . Sexual activity: Never  Lifestyle  . Physical activity:    Days per week: Not on file    Minutes per session: Not on file  . Stress: Not on file  Relationships  . Social connections:    Talks on phone: Not on file    Gets together: Not on file    Attends religious service: Not on file    Active member of club or organization: Not on file    Attends meetings of clubs or organizations: Not on file    Relationship status: Not on file  Other Topics Concern  . Not on file  Social History Narrative  . Not on file    Outpatient Encounter Medications as of 03/24/2018  Medication Sig  . apixaban (ELIQUIS) 2.5 MG TABS tablet Take 1 tablet (2.5 mg total) by mouth 2 (two) times daily.  [name] atorvastatin (LIPITOR) 80 MG tablet Take 1 tablet (80 mg total) by mouth daily at 6 PM.  . B Complex Vitamins (VITAMIN B COMPLEX PO) Take 1 tablet by mouth daily.   . clopidogrel (PLAVIX) 75 MG tablet TAKE 1 TABLET DAILY WITH BREAKFAST  . Coenzyme Q10 (CO Q 10 PO) Take 1 capsule by mouth daily.   [name] doxycycline (VIBRAMYCIN) 100 MG capsule Take 1 capsule (100 mg total) by mouth 2 (two) times daily.  . flavoxATE (URISPAS) 100 MG tablet Take 1 tablet (100 mg total) by mouth 3 (three) times daily as needed for bladder spasms.  . Multiple Vitamin (MULTIVITAMIN) capsule Take 1 capsule by mouth daily.    . Omega-3 Fatty Acids (FISH OIL PO) Take 1 capsule by mouth daily.   . [DISCONTINUED] testosterone (ANDRODERM) 2.5 MG/24HR Place 1 patch onto the skin daily.   No facility-administered  encounter medications on file as of 03/24/2018.     Activities of Daily Living No flowsheet data found.  Patient Care Team: Plotnikov, Evie Lacks, MD as PCP - General (Internal Medicine) Josue Hector, MD as Consulting Physician (Cardiology)   Assessment:   This is a routine wellness examination for Abbas. Physical assessment deferred to PCP.   Exercise Activities and Dietary recommendations   Diet (meal preparation, eat out, water intake, caffeinated beverages, dairy products, fruits and vegetables): {Desc; diets:16563}   Goals   None     Fall Risk Fall Risk  04/12/2016 03/15/2016 01/04/2015  Falls in the past year? (No Data) No Yes  Comment No new falls reported today - -  Number falls in past yr: - - 1  Injury with Fall? - - No  Risk for fall due to : - Other (Comment) -  Risk for fall due to: Comment - No fall risks identified today -    Depression Screen PHQ 2/9 Scores 03/15/2016 01/04/2015  PHQ - 2 Score 0 0    Cognitive Function        Immunization History  Administered Date(s) Administered  . Influenza-Unspecified 05/06/2014  . Pneumococcal Polysaccharide-23 03/17/2013  . Td 03/17/2008  . Zoster 05/12/2013   Screening Tests Health Maintenance  Topic Date Due  . PNA vac Low Risk Adult (2 of 2 - PCV13) 03/17/2014  . TETANUS/TDAP  03/17/2018  . INFLUENZA VACCINE  03/06/2018        Plan:     I have personally reviewed and noted the following in the patient's chart:   . Medical and social history . Use of alcohol, tobacco or illicit drugs  . Current medications and supplements . Functional ability and status . Nutritional status . Physical activity . Advanced directives . List of other physicians . Vitals . Screenings to include cognitive, depression, and falls . Referrals and appointments  In addition, I have reviewed and discussed with patient certain preventive protocols, quality metrics, and best practice recommendations. A written  personalized care plan for preventive services as well as general preventive health recommendations were provided to patient.     Michiel Cowboy, RN  03/21/2018

## 2018-03-24 ENCOUNTER — Ambulatory Visit (INDEPENDENT_AMBULATORY_CARE_PROVIDER_SITE_OTHER): Payer: Medicare Other | Admitting: Internal Medicine

## 2018-03-24 ENCOUNTER — Other Ambulatory Visit (INDEPENDENT_AMBULATORY_CARE_PROVIDER_SITE_OTHER): Payer: Medicare Other

## 2018-03-24 ENCOUNTER — Encounter: Payer: Self-pay | Admitting: Internal Medicine

## 2018-03-24 ENCOUNTER — Ambulatory Visit: Payer: Medicare Other

## 2018-03-24 DIAGNOSIS — I739 Peripheral vascular disease, unspecified: Secondary | ICD-10-CM

## 2018-03-24 DIAGNOSIS — E785 Hyperlipidemia, unspecified: Secondary | ICD-10-CM | POA: Diagnosis not present

## 2018-03-24 DIAGNOSIS — Z8546 Personal history of malignant neoplasm of prostate: Secondary | ICD-10-CM

## 2018-03-24 DIAGNOSIS — D62 Acute posthemorrhagic anemia: Secondary | ICD-10-CM | POA: Diagnosis not present

## 2018-03-24 DIAGNOSIS — I602 Nontraumatic subarachnoid hemorrhage from anterior communicating artery: Secondary | ICD-10-CM

## 2018-03-24 DIAGNOSIS — Z0000 Encounter for general adult medical examination without abnormal findings: Secondary | ICD-10-CM

## 2018-03-24 DIAGNOSIS — I10 Essential (primary) hypertension: Secondary | ICD-10-CM

## 2018-03-24 DIAGNOSIS — Z7901 Long term (current) use of anticoagulants: Secondary | ICD-10-CM

## 2018-03-24 DIAGNOSIS — Z9114 Patient's other noncompliance with medication regimen: Secondary | ICD-10-CM

## 2018-03-24 LAB — URINALYSIS, ROUTINE W REFLEX MICROSCOPIC
Bilirubin Urine: NEGATIVE
Ketones, ur: NEGATIVE
Leukocytes, UA: NEGATIVE
Nitrite: NEGATIVE
PH: 5.5 (ref 5.0–8.0)
SPECIFIC GRAVITY, URINE: 1.025 (ref 1.000–1.030)
TOTAL PROTEIN, URINE-UPE24: NEGATIVE
URINE GLUCOSE: NEGATIVE
Urobilinogen, UA: 0.2 (ref 0.0–1.0)

## 2018-03-24 LAB — CBC WITH DIFFERENTIAL/PLATELET
BASOS PCT: 0.6 % (ref 0.0–3.0)
Basophils Absolute: 0.1 10*3/uL (ref 0.0–0.1)
EOS PCT: 2.1 % (ref 0.0–5.0)
Eosinophils Absolute: 0.2 10*3/uL (ref 0.0–0.7)
HCT: 43.1 % (ref 39.0–52.0)
Hemoglobin: 14.3 g/dL (ref 13.0–17.0)
LYMPHS ABS: 2.1 10*3/uL (ref 0.7–4.0)
Lymphocytes Relative: 25.7 % (ref 12.0–46.0)
MCHC: 33.3 g/dL (ref 30.0–36.0)
MCV: 94.6 fl (ref 78.0–100.0)
MONO ABS: 0.9 10*3/uL (ref 0.1–1.0)
Monocytes Relative: 10.9 % (ref 3.0–12.0)
NEUTROS PCT: 60.7 % (ref 43.0–77.0)
Neutro Abs: 4.8 10*3/uL (ref 1.4–7.7)
Platelets: 188 10*3/uL (ref 150.0–400.0)
RBC: 4.55 Mil/uL (ref 4.22–5.81)
RDW: 14.5 % (ref 11.5–15.5)
WBC: 8 10*3/uL (ref 4.0–10.5)

## 2018-03-24 LAB — BASIC METABOLIC PANEL
BUN: 19 mg/dL (ref 6–23)
CALCIUM: 9.6 mg/dL (ref 8.4–10.5)
CO2: 29 mEq/L (ref 19–32)
Chloride: 105 mEq/L (ref 96–112)
Creatinine, Ser: 1.02 mg/dL (ref 0.40–1.50)
GFR: 73.22 mL/min (ref >60.00)
Glucose, Bld: 92 mg/dL (ref 70–99)
POTASSIUM: 4 meq/L (ref 3.5–5.1)
SODIUM: 141 meq/L (ref 135–145)

## 2018-03-24 LAB — HEPATIC FUNCTION PANEL
ALBUMIN: 4.3 g/dL (ref 3.5–5.2)
ALT: 11 U/L (ref 0–53)
AST: 16 U/L (ref 0–37)
Alkaline Phosphatase: 66 U/L (ref 39–117)
Bilirubin, Direct: 0.3 mg/dL (ref 0.0–0.3)
Total Bilirubin: 1.9 mg/dL — ABNORMAL HIGH (ref 0.2–1.2)
Total Protein: 7 g/dL (ref 6.0–8.3)

## 2018-03-24 LAB — TSH: TSH: 1.82 u[IU]/mL (ref 0.35–4.50)

## 2018-03-24 LAB — LIPID PANEL
CHOLESTEROL: 198 mg/dL (ref 0–200)
HDL: 64.4 mg/dL (ref >39.00)
LDL CALC: 112 mg/dL — AB (ref 0–99)
NONHDL: 133.34
Total CHOL/HDL Ratio: 3
Triglycerides: 106 mg/dL (ref 0.0–149.0)
VLDL: 21.2 mg/dL (ref 0.0–40.0)

## 2018-03-24 LAB — PSA: PSA: 0.32 ng/mL (ref 0.10–4.00)

## 2018-03-24 MED ORDER — CARVEDILOL 3.125 MG PO TABS: 3.1250 mg | ORAL_TABLET | Freq: Two times a day (BID) | ORAL | 3 refills | Status: DC

## 2018-03-24 MED ORDER — APIXABAN 2.5 MG PO TABS: 2.5000 mg | ORAL_TABLET | Freq: Two times a day (BID) | ORAL | 3 refills | Status: DC

## 2018-03-24 NOTE — Assessment & Plan Note (Signed)
Not taking Lipitor, Plavx

## 2018-03-24 NOTE — Assessment & Plan Note (Signed)
He was on Toprol but not taking it Risks associated with chronic BP treatment noncompliance were discussed. Compliance was encouraged.  States SBP is 150-160 at home

## 2018-03-24 NOTE — Addendum Note (Signed)
Addended by: Karren Cobble on: 03/24/2018 04:34 PM   Modules accepted: Orders

## 2018-03-24 NOTE — Assessment & Plan Note (Signed)
BP control was discussed Start Coreg 3.125 mg

## 2018-03-24 NOTE — Patient Instructions (Signed)

## 2018-03-24 NOTE — Assessment & Plan Note (Signed)
Chronic. 

## 2018-03-24 NOTE — Assessment & Plan Note (Signed)
Remote 2007 H/o XRT

## 2018-03-24 NOTE — Assessment & Plan Note (Signed)
Eliquis 

## 2018-03-24 NOTE — Assessment & Plan Note (Addendum)
  Here for medicare wellness/physical  Diet: heart healthy  Physical activity: not sedentary  Depression/mood screen: negative  Hearing: intact to whispered voice - hearing aids Visual acuity: grossly normal w/glasses, performs annual eye exam  ADLs: capable  Fall risk: low to none  Home safety: good  Cognitive evaluation: intact to orientation, naming, recall and repetition  EOL planning: adv directives, full code/ I agree  I have personally reviewed and have noted  1. The patient's medical, surgical and social history  2. Their use of alcohol, tobacco or illicit drugs  3. Their current medications and supplements  4. The patient's functional ability including ADL's, fall risks, home safety risks and hearing or visual impairment.  5. Diet and physical activities  6. Evidence for depression or mood disorders 7. The roster of all physicians providing medical care to patient - is listed in the Snapshot section of the chart and reviewed today.    Today patient counseled on age appropriate routine health concerns for screening and prevention, each reviewed and up to date or declined. Immunizations reviewed and up to date or declined. Labs ordered and reviewed. Risk factors for depression reviewed and negative. Hearing function and visual acuity are intact. ADLs screened and addressed as needed. Functional ability and level of safety reviewed and appropriate. Education, counseling and referrals performed based on assessed risks today. Patient provided with a copy of personalized plan for preventive services.  Lives in [place] in winters Refused all vaccines

## 2018-03-24 NOTE — Progress Notes (Signed)
Subjective:  Patient ID: Jason Hawkins, male    DOB: [birth year]  Age: 84 y.o. MRN: [id number]  CC: No chief complaint on file.   HPI Jason Hawkins presents for A fib, CVA, HTN f/u [facility] well exam   Outpatient Medications Prior to Visit  Medication Sig Dispense Refill  . apixaban (ELIQUIS) 2.5 MG TABS tablet Take 1 tablet (2.5 mg total) by mouth 2 (two) times daily. 180 tablet 1  . B Complex Vitamins (VITAMIN B COMPLEX PO) Take 1 tablet by mouth daily.     . Coenzyme Q10 (CO Q 10 PO) Take 1 capsule by mouth daily.     . Multiple Vitamin (MULTIVITAMIN) capsule Take 1 capsule by mouth daily.      . Omega-3 Fatty Acids (FISH OIL PO) Take 1 capsule by mouth daily.     [name] atorvastatin (LIPITOR) 80 MG tablet Take 1 tablet (80 mg total) by mouth daily at 6 PM. (Patient not taking: Reported on 03/24/2018) 90 tablet 2  . clopidogrel (PLAVIX) 75 MG tablet TAKE 1 TABLET DAILY WITH BREAKFAST (Patient not taking: Reported on 03/24/2018) 90 tablet 1  . doxycycline (VIBRAMYCIN) 100 MG capsule Take 1 capsule (100 mg total) by mouth 2 (two) times daily. (Patient not taking: Reported on 03/24/2018) 20 capsule 0  . flavoxATE (URISPAS) 100 MG tablet Take 1 tablet (100 mg total) by mouth 3 (three) times daily as needed for bladder spasms. (Patient not taking: Reported on 03/24/2018) 270 tablet 0   No facility-administered medications prior to visit.     ROS: Review of Systems  Constitutional: Negative for appetite change, fatigue and unexpected weight change.  HENT: Negative for congestion, nosebleeds, sneezing, sore throat and trouble swallowing.   Eyes: Negative for itching and visual disturbance.  Respiratory: Negative for cough.   Cardiovascular: Negative for chest pain, palpitations and leg swelling.  Gastrointestinal: Negative for abdominal distention, blood in stool, diarrhea and nausea.  Genitourinary: Negative for frequency and hematuria.  Musculoskeletal: Negative for back pain, gait problem, joint  swelling and neck pain.  Skin: Negative for rash.  Neurological: Negative for dizziness, tremors, speech difficulty and weakness.  Psychiatric/Behavioral: Negative for agitation, dysphoric mood and sleep disturbance. The patient is not nervous/anxious.     Objective:  Ht 5' 9.5" (1.765 m)   Wt 183 lb (83 kg)   BMI 26.64 kg/m   BP Readings from Last 3 Encounters:  01/29/17 (!) 150/78  01/07/17 138/68  05/08/16 (!) 170/98    Wt Readings from Last 3 Encounters:  03/24/18 183 lb (83 kg)  01/29/17 179 lb 6.4 oz (81.4 kg)  01/07/17 183 lb (83 kg)    Physical Exam  Constitutional: He is oriented to person, place, and time. He appears well-developed. No distress.  NAD  HENT:  Mouth/Throat: Oropharynx is clear and moist.  Eyes: Pupils are equal, round, and reactive to light. Conjunctivae are normal.  Neck: Normal range of motion. No JVD present. No thyromegaly present.  Cardiovascular: Normal rate, normal heart sounds and intact distal pulses. Exam reveals no gallop and no friction rub.  No murmur heard. Pulmonary/Chest: Effort normal and breath sounds normal. No respiratory distress. He has no wheezes. He has no rales. He exhibits no tenderness.  Abdominal: Soft. Bowel sounds are normal. He exhibits no distension and no mass. There is no tenderness. There is no rebound and no guarding.  Musculoskeletal: Normal range of motion. He exhibits no edema or tenderness.  Lymphadenopathy:    He has no cervical  adenopathy.  Neurological: He is alert and oriented to person, place, and time. He has normal reflexes. No cranial nerve deficit. He exhibits normal muscle tone. He displays a negative Romberg sign. Coordination and gait normal.  Skin: Skin is warm and dry. No rash noted.  Psychiatric: He has a normal mood and affect. His behavior is normal. Judgment and thought content normal.  irreg rhythm pt refused rectal exam Hearing aids Alert, cooperative  Lab Results  Component Value Date     WBC 7.7 01/07/2017   HGB 14.6 01/07/2017   HCT 42.9 01/07/2017   PLT 191.0 01/07/2017   GLUCOSE 88 01/07/2017   CHOL 182 01/07/2017   TRIG 115.0 01/07/2017   HDL 50.20 01/07/2017   LDLCALC 109 (H) 01/07/2017   ALT 10 01/07/2017   AST 17 01/07/2017   NA 141 01/07/2017   K 4.1 01/07/2017   CL 103 01/07/2017   CREATININE 0.92 01/07/2017   BUN 14 01/07/2017   CO2 27 01/07/2017   TSH 2.40 01/07/2017   PSA 0.24 01/07/2017   INR 1.28 02/03/2016   HGBA1C 5.6 02/16/2016    US Renal  Result Date: 01/28/2017 CLINICAL DATA:  Recent UTI EXAM: RENAL / URINARY TRACT ULTRASOUND COMPLETE COMPARISON:  None. FINDINGS: Right Kidney: Length: 10 cm. Echogenicity within normal limits. No mass or hydronephrosis visualized. Left Kidney: Length: 10.9 cm. Echogenicity within normal limits. No mass or hydronephrosis visualized. Bladder: Appears normal for degree of bladder distention. Prostate is prominent indenting upon the inferior bladder. IMPRESSION: No acute abnormality noted.  Prominent prostate is seen. Electronically Signed   By: Inez Catalina M.D.   On: 01/28/2017 14:18    Assessment & Plan:   There are no diagnoses linked to this encounter.   No orders of the defined types were placed in this encounter.    Follow-up: No follow-ups on file.  Walker Kehr, MD

## 2018-03-24 NOTE — Addendum Note (Signed)
Addended by: Cassandria Anger on: 03/24/2018 04:28 PM   Modules accepted: Orders

## 2018-03-24 NOTE — Assessment & Plan Note (Signed)
Not taking Lipitor

## 2018-03-24 NOTE — Assessment & Plan Note (Signed)
Remote CBC 

## 2018-03-29 LAB — HEAVY METALS PROFILE, URINE: ARSENIC 24H UR: 17 ug/L (ref <=80)

## 2018-04-01 ENCOUNTER — Telehealth: Payer: Self-pay

## 2018-04-01 NOTE — Telephone Encounter (Signed)
PA started on CoverMyMeds KEY: [id number]

## 2018-04-03 NOTE — Telephone Encounter (Signed)
Approved on August 27 CaseId:[id number];Status:Approved;Review Type:Prior Auth;Coverage Start Date:03/02/2018;Coverage End Date:09/28/2018;

## 2018-05-23 ENCOUNTER — Other Ambulatory Visit: Payer: Self-pay | Admitting: Internal Medicine

## 2018-05-23 MED ORDER — APIXABAN 2.5 MG PO TABS: 2.5000 mg | ORAL_TABLET | Freq: Two times a day (BID) | ORAL | 2 refills | Status: DC

## 2018-05-23 NOTE — Telephone Encounter (Signed)
Copied from [place] [id number]. Topic: Quick Communication - Rx Refill/Question >> May 23, 2018  1:34 PM Reyne Dumas L wrote: Medication:  apixaban (ELIQUIS) 2.5 MG TABS tablet   Has the patient contacted their pharmacy? Yes - states he wants this sent to [facility] and [facility]. Pt only wants to take the name brand of this medication. (Agent: If no, request that the patient contact the pharmacy for the refill.) (Agent: If yes, when and what did the pharmacy advise?)  Preferred Pharmacy (with phone number or street name): [place], [place] [phone] (Phone) [phone] (Fax)  Agent: Please be advised that RX refills may take up to 3 business days. We ask that you follow-up with your pharmacy.

## 2018-05-28 ENCOUNTER — Other Ambulatory Visit: Payer: Self-pay | Admitting: Internal Medicine

## 2018-05-28 MED ORDER — CARVEDILOL 3.125 MG PO TABS: 3.1250 mg | ORAL_TABLET | Freq: Two times a day (BID) | ORAL | 2 refills | Status: DC

## 2018-05-28 NOTE — Telephone Encounter (Signed)
Remaining refills sent to [facility]. Previous [facility]

## 2018-05-28 NOTE — Telephone Encounter (Signed)
Copied from [place]. Topic: Quick Communication - Rx Refill/Question >> May 28, 2018  3:49 PM Keene Breath wrote: Medication: carvedilol (COREG) 3.125 MG tablet, 90-day up to 3 refills  Has the patient contacted their pharmacy? No. (Agent: If no, request that the patient contact the pharmacy for the refill.) (Agent: If yes, when and what did the pharmacy advise?)  Preferred Pharmacy (with phone number or street name): [place], [place] [phone] (Phone) [phone] (Fax)

## 2018-07-24 ENCOUNTER — Telehealth: Payer: Self-pay | Admitting: Internal Medicine

## 2018-07-24 NOTE — Telephone Encounter (Signed)
Copied from [place] [phone]. Topic: Quick Communication - See Telephone Encounter >> Jul 24, 2018  3:54 PM Blase Mess A wrote: CRM for notification. See Telephone encounter for: 07/24/18.  Mitzi Hansen is calling to see if a fax from Dr. Carney Corners office regarding genetic cancer screening please advise [phone]

## 2018-07-25 NOTE — Telephone Encounter (Signed)
Notified fax was recieved

## 2018-07-28 NOTE — Telephone Encounter (Signed)
Jason Hawkins is calling in to follow up on rec faxed. Advised of status per Betty's note. He would like to have signed and faxed back.   Fax # is on the form

## 2018-07-29 NOTE — Telephone Encounter (Signed)
Will fax once I get form back from provider

## 2018-07-31 NOTE — Telephone Encounter (Signed)
Mitzi Hansen from Dr Estell Harpin office is calling up an update. Call back @ [phone]

## 2018-08-01 NOTE — Telephone Encounter (Signed)
Unable to reach Mitzi Hansen, still waiting on paperwork, can take up to 7-14 business days

## 2018-08-08 ENCOUNTER — Telehealth: Payer: Self-pay | Admitting: Internal Medicine

## 2018-08-08 NOTE — Telephone Encounter (Signed)
When I spoke to the office I informed them that I am waiting to receive them back from the provider but would fax them as soon as I could

## 2018-08-08 NOTE — Telephone Encounter (Signed)
Copied from [place] [phone]. Topic: General - Other >> Aug 08, 2018 10:50 AM Keene Breath wrote: Reason for CRM: Mitzi Hansen from Dr. Estell Harpin office called to get the status of a Lab req. that was sent to Dr. Alain Marion last thrusday, 07/31/18.  Please advise and call back at [phone]

## 2018-08-11 NOTE — Telephone Encounter (Signed)
Jason Hawkins from Dr Marlene Lard office calling about lab req, please call him at [phone]

## 2018-08-15 NOTE — Telephone Encounter (Signed)
Mitzi Hansen calling to follow up on the form for lab req. (heriditary cancer screening)

## 2018-08-26 NOTE — Telephone Encounter (Signed)
Mitzi Hansen is calling again to f/u on status of the form being completed by Dr. Alain Marion for genetic cancer screening. Mitzi Hansen has been calling since 12/20. Please call to advise or sign and fax back to order testing for the patient.   Mitzi Hansen from Dr. Trude Mcburney office [phone]

## 2018-08-28 NOTE — Telephone Encounter (Signed)
Jason Hawkins calling again, stating that they will also be faxing a denial form today.

## 2018-09-01 NOTE — Telephone Encounter (Signed)
Mitzi Hansen from [place] office is calling back because he did not receive the forms back from Dr. Alain Marion for genetic cancer screening  Do need the denial letter signed Dr. Alain Marion they will be faxing it over Please advise.  Thank you.

## 2018-12-09 ENCOUNTER — Other Ambulatory Visit: Payer: Self-pay | Admitting: *Deleted

## 2018-12-09 NOTE — Patient Outreach (Signed)
[facility]) Care Management  12/09/2018  Jason Hawkins [birth year] [id number]  Late entry: [place] CM case closure/ documentation only   Patient designated not active with [facility] CM;  [facility] CM previous cases re-closed today as instructed  [name], RN, BSN, [place] Coordinator [facility] Care Management  [phone]

## 2019-01-26 DIAGNOSIS — H524 Presbyopia: Secondary | ICD-10-CM | POA: Diagnosis not present

## 2019-01-26 DIAGNOSIS — H5203 Hypermetropia, bilateral: Secondary | ICD-10-CM | POA: Diagnosis not present

## 2019-01-26 DIAGNOSIS — H52223 Regular astigmatism, bilateral: Secondary | ICD-10-CM | POA: Diagnosis not present

## 2019-01-26 DIAGNOSIS — H25013 Cortical age-related cataract, bilateral: Secondary | ICD-10-CM | POA: Diagnosis not present

## 2019-01-26 DIAGNOSIS — H2513 Age-related nuclear cataract, bilateral: Secondary | ICD-10-CM | POA: Diagnosis not present

## 2019-03-26 ENCOUNTER — Other Ambulatory Visit (INDEPENDENT_AMBULATORY_CARE_PROVIDER_SITE_OTHER): Payer: Medicare Other

## 2019-03-26 ENCOUNTER — Other Ambulatory Visit: Payer: Self-pay

## 2019-03-26 ENCOUNTER — Encounter: Payer: Self-pay | Admitting: Internal Medicine

## 2019-03-26 ENCOUNTER — Ambulatory Visit (INDEPENDENT_AMBULATORY_CARE_PROVIDER_SITE_OTHER): Payer: Medicare Other | Admitting: Internal Medicine

## 2019-03-26 VITALS — BP 148/82 | HR 56 | Temp 97.7°F | Ht 69.5 in | Wt 183.0 lb

## 2019-03-26 DIAGNOSIS — Z66 Do not resuscitate: Secondary | ICD-10-CM | POA: Diagnosis not present

## 2019-03-26 DIAGNOSIS — Z9114 Patient's other noncompliance with medication regimen: Secondary | ICD-10-CM

## 2019-03-26 DIAGNOSIS — I482 Chronic atrial fibrillation, unspecified: Secondary | ICD-10-CM

## 2019-03-26 DIAGNOSIS — I10 Essential (primary) hypertension: Secondary | ICD-10-CM | POA: Diagnosis not present

## 2019-03-26 DIAGNOSIS — C61 Malignant neoplasm of prostate: Secondary | ICD-10-CM

## 2019-03-26 DIAGNOSIS — Z23 Encounter for immunization: Secondary | ICD-10-CM

## 2019-03-26 DIAGNOSIS — E785 Hyperlipidemia, unspecified: Secondary | ICD-10-CM

## 2019-03-26 LAB — URINALYSIS, ROUTINE W REFLEX MICROSCOPIC
Bilirubin Urine: NEGATIVE
Ketones, ur: NEGATIVE
Leukocytes,Ua: NEGATIVE
Nitrite: NEGATIVE
Specific Gravity, Urine: 1.02 (ref 1.000–1.030)
Total Protein, Urine: NEGATIVE
Urine Glucose: NEGATIVE
Urobilinogen, UA: 0.2 (ref 0.0–1.0)
pH: 6 (ref 5.0–8.0)

## 2019-03-26 LAB — HEPATIC FUNCTION PANEL
ALT: 11 U/L (ref 0–53)
AST: 16 U/L (ref 0–37)
Albumin: 4.6 g/dL (ref 3.5–5.2)
Alkaline Phosphatase: 66 U/L (ref 39–117)
Bilirubin, Direct: 0.2 mg/dL (ref 0.0–0.3)
Total Bilirubin: 1.2 mg/dL (ref 0.2–1.2)
Total Protein: 7.2 g/dL (ref 6.0–8.3)

## 2019-03-26 LAB — BASIC METABOLIC PANEL
BUN: 24 mg/dL — ABNORMAL HIGH (ref 6–23)
CO2: 26 mEq/L (ref 19–32)
Calcium: 9.5 mg/dL (ref 8.4–10.5)
Chloride: 104 mEq/L (ref 96–112)
Creatinine, Ser: 1.13 mg/dL (ref 0.40–1.50)
GFR: 61.07 mL/min (ref >60.00)
Glucose, Bld: 94 mg/dL (ref 70–99)
Potassium: 4.3 mEq/L (ref 3.5–5.1)
Sodium: 142 mEq/L (ref 135–145)

## 2019-03-26 LAB — CBC WITH DIFFERENTIAL/PLATELET
Basophils Absolute: 0 10*3/uL (ref 0.0–0.1)
Basophils Relative: 0.5 % (ref 0.0–3.0)
Eosinophils Absolute: 0.2 10*3/uL (ref 0.0–0.7)
Eosinophils Relative: 1.8 % (ref 0.0–5.0)
HCT: 42.2 % (ref 39.0–52.0)
Hemoglobin: 14.2 g/dL (ref 13.0–17.0)
Lymphocytes Relative: 24.7 % (ref 12.0–46.0)
Lymphs Abs: 2.1 10*3/uL (ref 0.7–4.0)
MCHC: 33.7 g/dL (ref 30.0–36.0)
MCV: 94.9 fl (ref 78.0–100.0)
Monocytes Absolute: 1 10*3/uL (ref 0.1–1.0)
Monocytes Relative: 11.5 % (ref 3.0–12.0)
Neutro Abs: 5.3 10*3/uL (ref 1.4–7.7)
Neutrophils Relative %: 61.5 % (ref 43.0–77.0)
Platelets: 206 10*3/uL (ref 150.0–400.0)
RBC: 4.44 Mil/uL (ref 4.22–5.81)
RDW: 14 % (ref 11.5–15.5)
WBC: 8.6 10*3/uL (ref 4.0–10.5)

## 2019-03-26 LAB — LIPID PANEL
Cholesterol: 208 mg/dL — ABNORMAL HIGH (ref 0–200)
HDL: 61.8 mg/dL (ref >39.00)
LDL Cholesterol: 123 mg/dL — ABNORMAL HIGH (ref 0–99)
NonHDL: 146.26
Total CHOL/HDL Ratio: 3
Triglycerides: 118 mg/dL (ref 0.0–149.0)
VLDL: 23.6 mg/dL (ref 0.0–40.0)

## 2019-03-26 LAB — TSH: TSH: 1.95 u[IU]/mL (ref 0.35–4.50)

## 2019-03-26 LAB — PSA: PSA: 0.25 ng/mL (ref 0.10–4.00)

## 2019-03-26 MED ORDER — APIXABAN 2.5 MG PO TABS: 2.5000 mg | ORAL_TABLET | Freq: Two times a day (BID) | ORAL | 2 refills | Status: DC

## 2019-03-26 NOTE — Assessment & Plan Note (Signed)
On Eliquis

## 2019-03-26 NOTE — Assessment & Plan Note (Signed)
We discussed DNR, confirmed DNR

## 2019-03-26 NOTE — Assessment & Plan Note (Signed)
  Coreg pt  stopped Risks associated with chronic BP treatment noncompliance were discussed. Compliance was encouraged.

## 2019-03-26 NOTE — Patient Instructions (Signed)
If you have medicare related insurance (such as traditional Medicare, [employer], [employer], or similar), Please make an appointment at the scheduling desk with Sharee Pimple, the [employer], for your Wellness visit in this office, which is a benefit with your insurance.    Mediterranean diet is good for you. ([employer] Mikle Bosworth has a typical Mediterranean cuisine menu) The Mediterranean diet is a way of eating based on the traditional cuisine of countries bordering the [employer]. While there is no single definition of the Mediterranean diet, it is typically high in vegetables, fruits, whole grains, beans, nut and seeds, and olive oil. The main components of Mediterranean diet include: [name] Daily consumption of vegetables, fruits, whole grains and healthy fats  . Weekly intake of fish, poultry, beans and eggs  . Moderate portions of dairy products  . Limited intake of red meat Other important elements of the Mediterranean diet are sharing meals with family and friends, enjoying a glass of red wine and being physically active. Health benefits of a Mediterranean diet: A traditional Mediterranean diet consisting of large quantities of fresh fruits and vegetables, nuts, fish and olive oil-coupled with physical activity-can reduce your risk of serious mental and physical health problems by: Preventing heart disease and strokes. Following a Mediterranean diet limits your intake of refined breads, processed foods, and red meat, and encourages drinking red wine instead of hard liquor-all factors that can help prevent heart disease and stroke. Keeping you agile. If you're an older adult, the nutrients gained with a Mediterranean diet may reduce your risk of developing muscle weakness and other signs of frailty by about 70 percent. Reducing the risk of Alzheimer's. Research suggests that the [place] diet may improve cholesterol, blood sugar levels, and overall blood vessel  health, which in turn may reduce your risk of Alzheimer's disease or dementia. Halving the risk of Parkinson's disease. The high levels of antioxidants in the Mediterranean diet can prevent cells from undergoing a damaging process called oxidative stress, thereby cutting the risk of Parkinson's disease in half. Increasing longevity. By reducing your risk of developing heart disease or cancer with the Mediterranean diet, you're reducing your risk of death at any age by 20%. Protecting against type 2 diabetes. A Mediterranean diet is rich in fiber which digests slowly, prevents huge swings in blood sugar, and can help you maintain a healthy weight.

## 2019-03-26 NOTE — Progress Notes (Signed)
Subjective:  Patient ID: Jason Hawkins, male    DOB: 06-23-1928  Age: 85 y.o. MRN: [id number]  CC: No chief complaint on file.   HPI Becket Wecker presents for a f/u on HTN, CVA, OA  Outpatient Medications Prior to Visit  Medication Sig Dispense Refill  . apixaban (ELIQUIS) 2.5 MG TABS tablet Take 1 tablet (2.5 mg total) by mouth 2 (two) times daily. 180 tablet 2  . B Complex Vitamins (VITAMIN B COMPLEX PO) Take 1 tablet by mouth daily.     . Coenzyme Q10 (CO Q 10 PO) Take 1 capsule by mouth daily.     . Multiple Vitamin (MULTIVITAMIN) capsule Take 1 capsule by mouth daily.      . Omega-3 Fatty Acids (FISH OIL PO) Take 1 capsule by mouth daily.     . carvedilol (COREG) 3.125 MG tablet Take 1 tablet (3.125 mg total) by mouth 2 (two) times daily with a meal. (Patient not taking: Reported on 03/26/2019) 180 tablet 2   No facility-administered medications prior to visit.     ROS: Review of Systems  Constitutional: Negative for appetite change, fatigue and unexpected weight change.  HENT: Negative for congestion, nosebleeds, sneezing, sore throat and trouble swallowing.   Eyes: Negative for itching and visual disturbance.  Respiratory: Negative for cough.   Cardiovascular: Negative for chest pain, palpitations and leg swelling.  Gastrointestinal: Negative for abdominal distention, blood in stool, diarrhea and nausea.  Genitourinary: Negative for frequency and hematuria.  Musculoskeletal: Positive for arthralgias. Negative for back pain, gait problem, joint swelling and neck pain.  Skin: Negative for rash.  Neurological: Negative for dizziness, tremors, speech difficulty and weakness.  Psychiatric/Behavioral: Negative for agitation, dysphoric mood, sleep disturbance and suicidal ideas. The patient is not nervous/anxious.     Objective:  BP (!) 148/82   Pulse (!) 56   Temp 97.7 F (36.5 C) (Oral)   Ht 5' 9.5" (1.765 m)   Wt 183 lb (83 kg)   SpO2 98%   BMI 26.64 kg/m   BP  Readings from Last 3 Encounters:  03/26/19 (!) 148/82  03/24/18 (!) 152/82  01/29/17 (!) 150/78    Wt Readings from Last 3 Encounters:  03/26/19 183 lb (83 kg)  03/24/18 183 lb (83 kg)  01/29/17 179 lb 6.4 oz (81.4 kg)    Physical Exam Constitutional:      General: He is not in acute distress.    Appearance: He is well-developed.     Comments: NAD  Eyes:     Conjunctiva/sclera: Conjunctivae normal.     Pupils: Pupils are equal, round, and reactive to light.  Neck:     Musculoskeletal: Normal range of motion.     Thyroid: No thyromegaly.     Vascular: No JVD.  Cardiovascular:     Rate and Rhythm: Normal rate. Rhythm irregular.     Heart sounds: Normal heart sounds. No murmur. No friction rub. No gallop.   Pulmonary:     Effort: Pulmonary effort is normal. No respiratory distress.     Breath sounds: Normal breath sounds. No wheezing or rales.  Chest:     Chest wall: No tenderness.  Abdominal:     General: Bowel sounds are normal. There is no distension.     Palpations: Abdomen is soft. There is no mass.     Tenderness: There is no abdominal tenderness. There is no guarding or rebound.  Musculoskeletal: Normal range of motion.        General:  No tenderness.  Lymphadenopathy:     Cervical: No cervical adenopathy.  Skin:    General: Skin is warm and dry.     Findings: No rash.  Neurological:     Mental Status: He is alert and oriented to person, place, and time.     Cranial Nerves: No cranial nerve deficit.     Motor: No abnormal muscle tone.     Coordination: Coordination normal.     Gait: Gait normal.     Deep Tendon Reflexes: Reflexes are normal and symmetric.  Psychiatric:        Behavior: Behavior normal.        Thought Content: Thought content normal.        Judgment: Judgment normal.     Lab Results  Component Value Date   WBC 8.0 03/24/2018   HGB 14.3 03/24/2018   HCT 43.1 03/24/2018   PLT 188.0 03/24/2018   GLUCOSE 92 03/24/2018   CHOL 198  03/24/2018   TRIG 106.0 03/24/2018   HDL 64.40 03/24/2018   LDLCALC 112 (H) 03/24/2018   ALT 11 03/24/2018   AST 16 03/24/2018   NA 141 03/24/2018   K 4.0 03/24/2018   CL 105 03/24/2018   CREATININE 1.02 03/24/2018   BUN 19 03/24/2018   CO2 29 03/24/2018   TSH 1.82 03/24/2018   PSA 0.32 03/24/2018   INR 1.28 02/03/2016   HGBA1C 5.6 02/16/2016    US Renal  Result Date: 01/28/2017 CLINICAL DATA:  Recent UTI EXAM: RENAL / URINARY TRACT ULTRASOUND COMPLETE COMPARISON:  None. FINDINGS: Right Kidney: Length: 10 cm. Echogenicity within normal limits. No mass or hydronephrosis visualized. Left Kidney: Length: 10.9 cm. Echogenicity within normal limits. No mass or hydronephrosis visualized. Bladder: Appears normal for degree of bladder distention. Prostate is prominent indenting upon the inferior bladder. IMPRESSION: No acute abnormality noted.  Prominent prostate is seen. Electronically Signed   By: Inez Catalina M.D.   On: 01/28/2017 14:18    Assessment & Plan:   There are no diagnoses linked to this encounter.   No orders of the defined types were placed in this encounter.    Follow-up: No follow-ups on file.  Walker Kehr, MD

## 2019-03-26 NOTE — Assessment & Plan Note (Signed)
s/p XRT PSA

## 2019-03-26 NOTE — Assessment & Plan Note (Signed)
Discussed.

## 2019-03-26 NOTE — Assessment & Plan Note (Signed)
Lipitor 

## 2019-10-20 DIAGNOSIS — Z23 Encounter for immunization: Secondary | ICD-10-CM | POA: Diagnosis not present

## 2019-11-16 DIAGNOSIS — Z23 Encounter for immunization: Secondary | ICD-10-CM | POA: Diagnosis not present

## 2019-12-10 ENCOUNTER — Telehealth: Payer: Self-pay

## 2019-12-10 MED ORDER — APIXABAN 2.5 MG PO TABS: 2.5000 mg | ORAL_TABLET | Freq: Two times a day (BID) | ORAL | 2 refills | Status: DC

## 2019-12-10 NOTE — Telephone Encounter (Signed)
RX sent

## 2019-12-10 NOTE — Telephone Encounter (Signed)
1.Medication Requested:apixaban (ELIQUIS) 2.5 MG TABS tablet  2. Pharmacy (Name, Street, City):[facility] [place]  3. On Med List: Yes   4. Last Visit with PCP: 8.20.2020  5. Next visit date with PCP: 8.23.2021    Agent: Please be advised that RX refills may take up to 3 business days. We ask that you follow-up with your pharmacy.

## 2019-12-21 ENCOUNTER — Telehealth: Payer: Self-pay | Admitting: Internal Medicine

## 2019-12-21 MED ORDER — APIXABAN 2.5 MG PO TABS: 2.5000 mg | ORAL_TABLET | Freq: Two times a day (BID) | ORAL | 0 refills | Status: DC

## 2019-12-21 NOTE — Telephone Encounter (Signed)
New message:   Hilda Blades from [facility] is calling to get a prior authorization for Eliquis for the pt. Please advise.

## 2019-12-21 NOTE — Telephone Encounter (Signed)
Short supply sent to local

## 2019-12-21 NOTE — Telephone Encounter (Signed)
New message:    1.Medication Requested: apixaban (ELIQUIS) 2.5 MG TABS tablet 2. Pharmacy (Name, Street, [place]): [id number] - [place], [place] - [address] 3. On Med List: yes  4. Last Visit with PCP: 03/26/19  5. Next visit date with PCP: 03/28/20  Pt's daughter is calling and states this is her 3rd call for this medication and now he is completely out of the medication. Please advise when medication has been sent. Agent: Please be advised that RX refills may take up to 3 business days. We ask that you follow-up with your pharmacy.

## 2019-12-21 NOTE — Telephone Encounter (Signed)
   Daughter [name] calling, states medication has not arrived.  Requesting  Eliquis be sent to local  Patient has no medication remaining

## 2019-12-22 NOTE — Telephone Encounter (Signed)
This request has been approved using information available on the patient's profile.  Case [id number] Status:Approved Review Type:Prior Auth Coverage Start Date:11/22/2019 Coverage End Date:12/21/2020

## 2019-12-22 NOTE — Telephone Encounter (Signed)
See other mychart message.

## 2020-01-01 NOTE — Progress Notes (Signed)
Patient ID: Jason Hawkins, male   DOB: 1928-07-02, 86 y.o.   MRN: [id number]    86 y.o. not seen in close to 3 years  History of AVR and afib.  Had [facility] with coil embolization of artery on 02/01/16. Complicated by STEMI while in rehab. Coumadin changed to eliquis.  Cath with small 80% lesion in OM and occluded distal circumflex that was stented Last echo in July 2017 with EF 60-65% normal bioprosthetic valve with mean gradient 8 peak 17 mmHg   He still has a fatalistic attitude and does not really want to take any meds. His daughter was with him today and indicated he lack of ambition to do anything Or walk the dogs  Discussed getting back on diuretics for his LE edema " I take a wizz enough as it is"  He does note taking his eliquis Not taking any beta blocker   ROS: Denies fever, malais, weight loss, blurry vision, decreased visual acuity, cough, sputum, SOB, hemoptysis, pleuritic pain, palpitaitons, heartburn, abdominal pain, melena, lower extremity edema, claudication, or rash.  All other systems reviewed and negative  General: Affect appropriate Healthy:  appears stated age 39: normal Neck supple with no adenopathy JVP normal no bruits no thyromegaly Lungs clear with no wheezing and good diaphragmatic motion Heart:  [id number] click SEM  murmur, no rub, gallop or click PMI normal post sternotomy  Abdomen: benighn, BS positve, no tenderness, no AAA no bruit.  No HSM or HJR Distal pulses intact with no bruits Plus 2 bilateral  Edema with stasis  Neuro non-focal No muscular weakness   Current Outpatient Medications  Medication Sig Dispense Refill  . apixaban (ELIQUIS) 2.5 MG TABS tablet Take 1 tablet (2.5 mg total) by mouth 2 (two) times daily. 30 tablet 0  . B Complex Vitamins (VITAMIN B COMPLEX PO) Take 1 tablet by mouth daily.     . carvedilol (COREG) 3.125 MG tablet Take 1 tablet (3.125 mg total) by mouth 2 (two) times daily with a meal. (Patient not taking: Reported on  03/26/2019) 180 tablet 2  . Coenzyme Q10 (CO Q 10 PO) Take 1 capsule by mouth daily.     . Multiple Vitamin (MULTIVITAMIN) capsule Take 1 capsule by mouth daily.      . Omega-3 Fatty Acids (FISH OIL PO) Take 1 capsule by mouth daily.      No current facility-administered medications for this visit.    Allergies  Bee venom, Codeine, and Lisinopril  Electrocardiogram:    Afib rate 60 PVC 01/06/20   Assessment and Plan AVR: normal valve clicks SBE  Echo to assess gradients and EF Edema  Venous disease refuses to take diuretic. Discussed elevation and low sodium diet  PAF:  ECG with afib today rate fine on eliquis for stroke prophylaxis  HLD Previously on statin diet Rx only at this point/age  CAD:  Stable no angina  Stent to distal circumflex 02/16/16  SAH:  No residual neurologic deficits. Fortunate coiled aneurysm so hopefully risk of recurrence On blood thinners will be less    F/U in 6 months    Jenkins Rouge

## 2020-01-06 ENCOUNTER — Other Ambulatory Visit: Payer: Self-pay

## 2020-01-06 ENCOUNTER — Ambulatory Visit (INDEPENDENT_AMBULATORY_CARE_PROVIDER_SITE_OTHER): Payer: Medicare Other | Admitting: Cardiovascular Disease

## 2020-01-06 ENCOUNTER — Encounter: Payer: Self-pay | Admitting: Cardiovascular Disease

## 2020-01-06 VITALS — BP 126/80 | HR 60 | Ht 69.0 in | Wt 183.0 lb

## 2020-01-06 DIAGNOSIS — Z952 Presence of prosthetic heart valve: Secondary | ICD-10-CM | POA: Diagnosis not present

## 2020-01-06 DIAGNOSIS — I480 Paroxysmal atrial fibrillation: Secondary | ICD-10-CM

## 2020-01-06 NOTE — Patient Instructions (Addendum)
Medication Instructions:  *If you need a refill on your cardiac medications before your next appointment, please call your pharmacy*  Lab Work: If you have labs (blood work) drawn today and your tests are completely normal, you will receive your results only by: . MyChart Message (if you have MyChart) OR . A paper copy in the mail If you have any lab test that is abnormal or we need to change your treatment, we will call you to review the results.  Follow-Up: At [facility], you and your health needs are our priority.  As part of our continuing mission to provide you with exceptional heart care, we have created designated Provider Care Teams.  These Care Teams include your primary Cardiologist (physician) and Advanced Practice Providers (APPs -  Physician Assistants and Nurse Practitioners) who all work together to provide you with the care you need, when you need it.  We recommend signing up for the patient portal called "MyChart".  Sign up information is provided on this After Visit Summary.  MyChart is used to connect with patients for Virtual Visits (Telemedicine).  Patients are able to view lab/test results, encounter notes, upcoming appointments, etc.  Non-urgent messages can be sent to your provider as well.   To learn more about what you can do with MyChart, go to [email or web address].    Your next appointment:   6 month(s)  The format for your next appointment:   In Person  Provider:   You may see Dr. Nishan or one of the following Advanced Practice Providers on your designated Care Team:    Lori Gerhardt, NP  Laura Ingold, NP  Jill McDaniel, NP   

## 2020-01-13 ENCOUNTER — Other Ambulatory Visit: Payer: Self-pay | Admitting: Internal Medicine

## 2020-02-23 DIAGNOSIS — Z20822 Contact with and (suspected) exposure to covid-19: Secondary | ICD-10-CM | POA: Diagnosis not present

## 2020-03-24 ENCOUNTER — Ambulatory Visit (INDEPENDENT_AMBULATORY_CARE_PROVIDER_SITE_OTHER): Payer: Medicare Other

## 2020-03-24 DIAGNOSIS — Z0000 Encounter for general adult medical examination without abnormal findings: Secondary | ICD-10-CM

## 2020-03-24 NOTE — Progress Notes (Addendum)
I connected with Jason Hawkins today by telephone and verified that I am speaking with the correct person using two identifiers. Location patient: home Location provider: work Persons participating in the virtual visit: Jason Hawkins, Jason Hawkins (daughter-in-law) and Jason Abu, LPN   I discussed the limitations, risks, security and privacy concerns of performing an evaluation and management service by telephone and the availability of in person appointments. I also discussed with the patient that there may be a patient responsible charge related to this service. The patient expressed understanding and verbally consented to this telephonic visit.    Interactive audio and video telecommunications were attempted between this provider and patient, however failed, due to patient having technical difficulties OR patient did not have access to video capability.  We continued and completed visit with audio only.  Some vital signs may be absent or patient reported.   Time Spent with patient on telephone encounter: 20 minutes  Subjective:   Jason Hawkins is a 86 y.o. male who presents for Medicare Annual/Subsequent preventive examination.  Review of Systems    No ROS. Medicare Wellness Virtual Visit. Additional risk factors are reflected in social history. Cardiac Risk Factors include: advanced age (>10men, >37 women);family history of premature cardiovascular disease;hypertension;male gender     Objective:    There were no vitals filed for this visit. There is no height or weight on file to calculate BMI.  Advanced Directives 03/24/2020 03/08/2016 02/25/2016 02/20/2016 02/13/2016 02/01/2016  Does Patient Have a Medical Advance Directive? Yes Yes Yes Yes Yes Yes  Type of Paramedic of [place];Living will [place];Living will Living will;Healthcare Power of Attorney Living will;Healthcare Power of Attorney Living will;Healthcare Power of  [place];Living will  Does patient want to make changes to medical advance directive? No - Patient declined No - Patient declined - No - Patient declined - No - Patient declined  Copy of [place] in Chart? No - copy requested No - copy requested No - copy requested No - copy requested No - copy requested No - copy requested    Current Medications (verified) Outpatient Encounter Medications as of 03/24/2020  Medication Sig   B Complex Vitamins (VITAMIN B COMPLEX PO) Take 1 tablet by mouth daily.    Coenzyme Q10 (CO Q 10 PO) Take 1 capsule by mouth daily.    ELIQUIS 2.5 MG TABS tablet TAKE 1 TABLET BY MOUTH TWICE A DAY   Multiple Vitamin (MULTIVITAMIN) capsule Take 1 capsule by mouth daily.     Omega-3 Fatty Acids (FISH OIL PO) Take 1 capsule by mouth daily.    carvedilol (COREG) 3.125 MG tablet Take 1 tablet (3.125 mg total) by mouth 2 (two) times daily with a meal. (Patient not taking: Reported on 03/26/2019)   [DISCONTINUED] testosterone (ANDRODERM) 2.5 MG/24HR Place 1 patch onto the skin daily.   No facility-administered encounter medications on file as of 03/24/2020.    Allergies (verified) Bee venom, Codeine, and Lisinopril   History: Past Medical History:  Diagnosis Date   Bradycardia    a. prior to STEMI 02/2016 -> improved with stenting.   Brain aneurysm    a. s/p coiling 01/2016.   CAD (coronary artery disease)    a. STEMI 02/2016 while on rehab s/p DES to Cx, EF 60-65%.   Chronic atrial fibrillation (HCC)    HTN (hypertension)    Hypercholesteremia    Lower extremity edema    Severe aortic stenosis  a. s/p aortic valve replacement using a pericardial tissue valve   Subarachnoid bleed (HCC)    a. due to brain aneurysm 01/2016.   Past Surgical History:  Procedure Laterality Date   AORTIC VALVE REPLACEMENT  08/30/2003   Dr. Cyndia Bent   CARDIAC CATHETERIZATION N/A 02/16/2016   Procedure: Left Heart Cath and Coronary  Angiography;  Surgeon: Peter M Martinique, MD;  Location: [facility];  Service: Cardiovascular;  Laterality: N/A;   CARDIAC CATHETERIZATION N/A 02/16/2016   Procedure: Coronary Stent Intervention;  Surgeon: Peter M Martinique, MD;  Location: [facility];  Service: Cardiovascular;  Laterality: N/A;   IR GENERIC HISTORICAL  02/01/2016   IR TRANSCATH/EMBOLIZ 02/01/2016 [facility]   IR GENERIC HISTORICAL  02/01/2016   IR ANGIOGRAM FOLLOW UP STUDY 02/01/2016 [facility]   IR GENERIC HISTORICAL  02/01/2016   IR ANGIOGRAM FOLLOW UP STUDY 02/01/2016 [facility]   IR GENERIC HISTORICAL  02/01/2016   IR ANGIO INTRA EXTRACRAN SEL INTERNAL CAROTID BILAT MOD SED 02/01/2016 [facility]   IR GENERIC HISTORICAL  02/01/2016   IR NEURO EACH ADD'L AFTER BASIC UNI LEFT (MS) 02/01/2016 [facility]   RADIOLOGY WITH ANESTHESIA N/A 02/01/2016   Procedure: RADIOLOGY WITH ANESTHESIA;  Surgeon: Consuella Lose, MD;  Location: [facility];  Service: Radiology;  Laterality: N/A;   Family History  Problem Relation Age of Onset   Diabetes Mother    Heart disease Father    Heart attack Father    Heart attack Unknown    Diabetes Unknown    Cancer Son 82       prostate ca   Social History   Socioeconomic History   Marital status: Widowed    Spouse name: Not on file   Number of children: Not on file   Years of education: Not on file   Highest education level: Not on file  Occupational History   Not on file  Tobacco Use   Smoking status: Former Smoker   Smokeless tobacco: Never Used  Substance and Sexual Activity   Alcohol use: Yes    Alcohol/week: 14.0 standard drinks    Types: 14 Cans of beer per week   Drug use: No   Sexual activity: Never  Other Topics Concern   Not on file  Social History Narrative   Not on file   Social Determinants of Health   Financial Resource Strain: Low Risk    Difficulty of Paying Living Expenses: Not hard at all  Food Insecurity: No Food Insecurity   Worried About  Charity fundraiser in the Last Year: Never true   [place] in the Last Year: Never true  Transportation Needs: No Transportation Needs   Lack of Transportation (Medical): No   Lack of Transportation (Non-Medical): No  Physical Activity: Inactive   Days of Exercise per Week: 0 days   Minutes of Exercise per Session: 0 min  Stress: No Stress Concern Present   Feeling of Stress : Not at all  Social Connections: Unknown   Frequency of Communication with Friends and Family: More than three times a week   Frequency of Social Gatherings with Friends and Family: More than three times a week   Attends Religious Services: Patient refused   Active Member of Clubs or Organizations: No   Attends Archivist Meetings: Patient refused   Marital Status: Widowed    Tobacco Counseling Counseling given: Not Answered   Clinical Intake:  Pre-visit preparation completed: Yes  Pain : No/denies pain     Nutritional Risks: None Diabetes: No  How often do you need to have someone help you when you read instructions, pamphlets, or other written materials from your doctor or pharmacy?: 1 - Never What is the last grade level you completed in school?: [employer], some college  Diabetic? no  Interpreter Needed?: No  Information entered by :: Jason Abu, LPN   Activities of Daily Living In your present state of health, do you have any difficulty performing the following activities: 03/24/2020  Hearing? Y  Vision? N  Difficulty concentrating or making decisions? Y  Comment has forgotten names, but nothing serious to be 86 years old  Walking or climbing stairs? N  Dressing or bathing? N  Comment wears hearing aids  Doing errands, shopping? Y  Comment due to loss of hearing  Preparing Food and eating ? N  Using the Toilet? N  In the past six months, have you accidently leaked urine? Y  Do you have problems with loss of bowel control? Y  Managing your Medications? N  Managing  your Finances? N  Housekeeping or managing your Housekeeping? N  Some recent data might be hidden    Patient Care Team: Plotnikov, Evie Lacks, MD as PCP - General (Internal Medicine) Josue Hector, MD as Consulting Physician (Cardiology)  Indicate any recent Medical Services you may have received from other than [facility] providers in the past year (date may be approximate).     Assessment:   This is a routine wellness examination for [place].  Hearing/Vision screen No exam data present  Dietary issues and exercise activities discussed: Current Exercise Habits: The patient does not participate in regular exercise at present, Exercise limited by: cardiac condition(s)  Goals       Client understands the importance of follow-up with providers by attending scheduled visits (pt-stated)      In the process of finding a new eye doctor and dentist here in [place].       Depression Screen PHQ 2/9 Scores 03/24/2020 03/15/2016 01/04/2015  PHQ - 2 Score 0 0 0    Fall Risk Fall Risk  03/24/2020 03/26/2019 03/07/2018 04/12/2016 03/15/2016  Falls in the past year? 0 0 No (No Data) No  Comment - - Emmi Telephone Survey: data to providers prior to load No new falls reported today -  Number falls in past yr: 0 0 - - -  Injury with Fall? 0 0 - - -  Risk for fall due to : History of fall(s);Impaired balance/gait - - - Other (Comment)  Risk for fall due to: Comment - - - - No fall risks identified today  Follow up Falls evaluation completed - - - -    Any stairs in or around the home? No  If so, are there any without handrails? No  Home free of loose throw rugs in walkways, pet beds, electrical cords, etc? Yes  Adequate lighting in your home to reduce risk of falls? Yes   ASSISTIVE DEVICES UTILIZED TO PREVENT FALLS:  Life alert? No  Use of a cane, walker or w/c? Yes  Grab bars in the bathroom? Yes  Shower chair or bench in shower? Yes  Elevated toilet seat or a handicapped toilet? No   TIMED UP AND  GO:  Was the test performed? No .  Length of time to ambulate 10 feet: 0 sec.   Gait steady and fast without use of assistive device  Cognitive Function:  Immunizations Immunization History  Administered Date(s) Administered   Influenza-Unspecified 05/06/2014   Pneumococcal Polysaccharide-23 03/17/2013   Td 03/17/2008   Tdap 03/26/2019   Zoster 05/12/2013    TDAP status: Up to date Flu Vaccine status: Declined, Education has been provided regarding the importance of this vaccine but patient still declined. Advised may receive this vaccine at local pharmacy or Health Dept. Aware to provide a copy of the vaccination record if obtained from local pharmacy or Health Dept. Verbalized acceptance and understanding. Pneumococcal vaccine status: Declined,  Education has been provided regarding the importance of this vaccine but patient still declined. Advised may receive this vaccine at local pharmacy or Health Dept. Aware to provide a copy of the vaccination record if obtained from local pharmacy or Health Dept. Verbalized acceptance and understanding.  Covid-19 vaccine status: Completed vaccines  Qualifies for Shingles Vaccine? Yes   Zostavax completed Yes   Shingrix Completed?: No.    Education has been provided regarding the importance of this vaccine. Patient has been advised to call insurance company to determine out of pocket expense if they have not yet received this vaccine. Advised may also receive vaccine at local pharmacy or Health Dept. Verbalized acceptance and understanding.  Screening Tests Health Maintenance  Topic Date Due   COVID-19 Vaccine (1) Never done   PNA vac Low Risk Adult (2 of 2 - PCV13) 03/17/2014   INFLUENZA VACCINE  03/06/2020   TETANUS/TDAP  03/25/2029    Health Maintenance  Health Maintenance Due  Topic Date Due   COVID-19 Vaccine (1) Never done   PNA vac Low Risk Adult (2 of 2 - PCV13) 03/17/2014   INFLUENZA VACCINE  03/06/2020     Colorectal cancer screening: No longer required.   Lung Cancer Screening: (Low Dose CT Chest recommended if Age 85-80 years, 30 pack-year currently smoking OR have quit w/in 15years.) does not qualify.   Lung Cancer Screening Referral: no  Additional Screening:  Hepatitis C Screening: does not qualify; Completed no  Vision Screening: Recommended annual ophthalmology exams for early detection of glaucoma and other disorders of the eye. Is the patient up to date with their annual eye exam?  Yes  Who is the provider or what is the name of the office in which the patient attends annual eye exams? In process of scheduling  If pt is not established with a provider, would they like to be referred to a provider to establish care? No .   Dental Screening: Recommended annual dental exams for proper oral hygiene  Community Resource Referral / Chronic Care Management: CRR required this visit?  No   CCM required this visit?  No      Plan:     I have personally reviewed and noted the following in the patients chart:   Medical and social history Use of alcohol, tobacco or illicit drugs  Current medications and supplements Functional ability and status Nutritional status Physical activity Advanced directives List of other physicians Hospitalizations, surgeries, and ER visits in previous 12 months Vitals Screenings to include cognitive, depression, and falls Referrals and appointments  In addition, I have reviewed and discussed with patient certain preventive protocols, quality metrics, and best practice recommendations. A written personalized care plan for preventive services as well as general preventive health recommendations were provided to patient.     Sheral Flow, LPN   02/02/5283   Nurse Notes:  Patient is cogitatively intact. There were no vitals filed for this visit. There is no height  or weight on file to calculate BMI. Patient has a cane and a walker if  needed, but refuses.  Medical screening examination/treatment/procedure(s) were performed by non-physician practitioner and as supervising physician I was immediately available for consultation/collaboration.  I agree with above. Lew Dawes, MD

## 2020-03-24 NOTE — Patient Instructions (Addendum)
Jason Hawkins , Thank you for taking time to come for your Medicare Wellness Visit. I appreciate your ongoing commitment to your health goals. Please review the following plan we discussed and let me know if I can assist you in the future.   Screening recommendations/referrals: Colonoscopy: declined Recommended yearly ophthalmology/optometry visit for glaucoma screening and checkup Recommended yearly dental visit for hygiene and checkup  Vaccinations: Influenza vaccine: declined Pneumococcal vaccine: Pneumovax 23 done 03/17/2013; declined Prevnar 13 Tdap vaccine: 03/26/2019; due every 10 years Shingles vaccine: declined; had Zoster vaccine 05/12/2013   Covid-19: completed in [place]; will bring vaccination card to next visit  Advanced directives: Please bring a copy of your health care power of attorney and living will to the office at your convenience.  Conditions/risks identified: Yes  Next appointment: Please schedule your next Medicare Wellness Visit with your Nurse Health Advisor in 1 year  Preventive Care 65 Years and Older, Male Preventive care refers to lifestyle choices and visits with your health care provider that can promote health and wellness. What does preventive care include?  A yearly physical exam. This is also called an annual well check.  Dental exams once or twice a year.  Routine eye exams. Ask your health care provider how often you should have your eyes checked.  Personal lifestyle choices, including:  Daily care of your teeth and gums.  Regular physical activity.  Eating a healthy diet.  Avoiding tobacco and drug use.  Limiting alcohol use.  Practicing safe sex.  Taking low doses of aspirin every day.  Taking vitamin and mineral supplements as recommended by your health care provider. What happens during an annual well check? The services and screenings done by your health care provider during your annual well check will depend on your age,  overall health, lifestyle risk factors, and family history of disease. Counseling  Your health care provider may ask you questions about your:  Alcohol use.  Tobacco use.  Drug use.  Emotional well-being.  Home and relationship well-being.  Sexual activity.  Eating habits.  History of falls.  Memory and ability to understand (cognition).  Work and work Statistician. Screening  You may have the following tests or measurements:  Height, weight, and BMI.  Blood pressure.  Lipid and cholesterol levels. These may be checked every 5 years, or more frequently if you are over 26 years old.  Skin check.  Lung cancer screening. You may have this screening every year starting at age 43 if you have a 30-pack-year history of smoking and currently smoke or have quit within the past 15 years.  Fecal occult blood test (FOBT) of the stool. You may have this test every year starting at age 39.  Flexible sigmoidoscopy or colonoscopy. You may have a sigmoidoscopy every 5 years or a colonoscopy every 10 years starting at age 9.  Prostate cancer screening. Recommendations will vary depending on your family history and other risks.  Hepatitis C blood test.  Hepatitis B blood test.  Sexually transmitted disease (STD) testing.  Diabetes screening. This is done by checking your blood sugar (glucose) after you have not eaten for a while (fasting). You may have this done every 1-3 years.  Abdominal aortic aneurysm (AAA) screening. You may need this if you are a current or former smoker.  Osteoporosis. You may be screened starting at age 27 if you are at high risk. Talk with your health care provider about your test results, treatment options, and if necessary, the need for  more tests. Vaccines  Your health care provider may recommend certain vaccines, such as:  Influenza vaccine. This is recommended every year.  Tetanus, diphtheria, and acellular pertussis (Tdap, Td) vaccine. You may  need a Td booster every 10 years.  Zoster vaccine. You may need this after age 58.  Pneumococcal 13-valent conjugate (PCV13) vaccine. One dose is recommended after age 43.  Pneumococcal polysaccharide (PPSV23) vaccine. One dose is recommended after age 45. Talk to your health care provider about which screenings and vaccines you need and how often you need them. This information is not intended to replace advice given to you by your health care provider. Make sure you discuss any questions you have with your health care provider. Document Released: 08/19/2015 Document Revised: 04/11/2016 Document Reviewed: 05/24/2015 Elsevier Interactive Patient Education  2017 [place] Prevention in the Home Falls can cause injuries. They can happen to people of all ages. There are many things you can do to make your home safe and to help prevent falls. What can I do on the outside of my home?  Regularly fix the edges of walkways and driveways and fix any cracks.  Remove anything that might make you trip as you walk through a door, such as a raised step or threshold.  Trim any bushes or trees on the path to your home.  Use bright outdoor lighting.  Clear any walking paths of anything that might make someone trip, such as rocks or tools.  Regularly check to see if handrails are loose or broken. Make sure that both sides of any steps have handrails.  Any raised decks and porches should have guardrails on the edges.  Have any leaves, snow, or ice cleared regularly.  Use sand or salt on walking paths during winter.  Clean up any spills in your garage right away. This includes oil or grease spills. What can I do in the bathroom?  Use night lights.  Install grab bars by the toilet and in the tub and shower. Do not use towel bars as grab bars.  Use non-skid mats or decals in the tub or shower.  If you need to sit down in the shower, use a plastic, non-slip stool.  Keep the floor  dry. Clean up any water that spills on the floor as soon as it happens.  Remove soap buildup in the tub or shower regularly.  Attach bath mats securely with double-sided non-slip rug tape.  Do not have throw rugs and other things on the floor that can make you trip. What can I do in the bedroom?  Use night lights.  Make sure that you have a light by your bed that is easy to reach.  Do not use any sheets or blankets that are too big for your bed. They should not hang down onto the floor.  Have a firm chair that has side arms. You can use this for support while you get dressed.  Do not have throw rugs and other things on the floor that can make you trip. What can I do in the kitchen?  Clean up any spills right away.  Avoid walking on wet floors.  Keep items that you use a lot in easy-to-reach places.  If you need to reach something above you, use a strong step stool that has a grab bar.  Keep electrical cords out of the way.  Do not use floor polish or wax that makes floors slippery. If you must use wax, use non-skid  floor wax.  Do not have throw rugs and other things on the floor that can make you trip. What can I do with my stairs?  Do not leave any items on the stairs.  Make sure that there are handrails on both sides of the stairs and use them. Fix handrails that are broken or loose. Make sure that handrails are as long as the stairways.  Check any carpeting to make sure that it is firmly attached to the stairs. Fix any carpet that is loose or worn.  Avoid having throw rugs at the top or bottom of the stairs. If you do have throw rugs, attach them to the floor with carpet tape.  Make sure that you have a light switch at the top of the stairs and the bottom of the stairs. If you do not have them, ask someone to add them for you. What else can I do to help prevent falls?  Wear shoes that:  Do not have high heels.  Have rubber bottoms.  Are comfortable and fit you  well.  Are closed at the toe. Do not wear sandals.  If you use a stepladder:  Make sure that it is fully opened. Do not climb a closed stepladder.  Make sure that both sides of the stepladder are locked into place.  Ask someone to hold it for you, if possible.  Clearly mark and make sure that you can see:  Any grab bars or handrails.  First and last steps.  Where the edge of each step is.  Use tools that help you move around (mobility aids) if they are needed. These include:  Canes.  Walkers.  Scooters.  Crutches.  Turn on the lights when you go into a dark area. Replace any light bulbs as soon as they burn out.  Set up your furniture so you have a clear path. Avoid moving your furniture around.  If any of your floors are uneven, fix them.  If there are any pets around you, be aware of where they are.  Review your medicines with your doctor. Some medicines can make you feel dizzy. This can increase your chance of falling. Ask your doctor what other things that you can do to help prevent falls. This information is not intended to replace advice given to you by your health care provider. Make sure you discuss any questions you have with your health care provider. Document Released: 05/19/2009 Document Revised: 12/29/2015 Document Reviewed: 08/27/2014 Elsevier Interactive Patient Education  2017 [employer].

## 2020-03-28 ENCOUNTER — Other Ambulatory Visit: Payer: Self-pay

## 2020-03-28 ENCOUNTER — Ambulatory Visit: Payer: Medicare Other | Admitting: Internal Medicine

## 2020-03-28 ENCOUNTER — Encounter: Payer: Self-pay | Admitting: Internal Medicine

## 2020-03-28 ENCOUNTER — Ambulatory Visit (INDEPENDENT_AMBULATORY_CARE_PROVIDER_SITE_OTHER): Payer: Medicare Other | Admitting: Internal Medicine

## 2020-03-28 VITALS — BP 130/66 | HR 64 | Temp 98.0°F | Ht 69.0 in | Wt 181.8 lb

## 2020-03-28 DIAGNOSIS — E785 Hyperlipidemia, unspecified: Secondary | ICD-10-CM | POA: Diagnosis not present

## 2020-03-28 DIAGNOSIS — I482 Chronic atrial fibrillation, unspecified: Secondary | ICD-10-CM

## 2020-03-28 DIAGNOSIS — N320 Bladder-neck obstruction: Secondary | ICD-10-CM | POA: Diagnosis not present

## 2020-03-28 DIAGNOSIS — R5382 Chronic fatigue, unspecified: Secondary | ICD-10-CM | POA: Diagnosis not present

## 2020-03-28 DIAGNOSIS — I10 Essential (primary) hypertension: Secondary | ICD-10-CM | POA: Diagnosis not present

## 2020-03-28 DIAGNOSIS — C61 Malignant neoplasm of prostate: Secondary | ICD-10-CM | POA: Diagnosis not present

## 2020-03-28 DIAGNOSIS — Z7901 Long term (current) use of anticoagulants: Secondary | ICD-10-CM

## 2020-03-28 DIAGNOSIS — I607 Nontraumatic subarachnoid hemorrhage from unspecified intracranial artery: Secondary | ICD-10-CM | POA: Diagnosis not present

## 2020-03-28 LAB — CBC WITH DIFFERENTIAL/PLATELET
Absolute Monocytes: 956 cells/uL — ABNORMAL HIGH (ref 200–950)
Basophils Absolute: 41 cells/uL (ref 0–200)
Basophils Relative: 0.5 %
Eosinophils Absolute: 162 cells/uL (ref 15–500)
Eosinophils Relative: 2 %
HCT: 41.3 % (ref 38.5–50.0)
Hemoglobin: 13.6 g/dL (ref 13.2–17.1)
Lymphs Abs: 1750 cells/uL (ref 850–3900)
MCH: 30.3 pg (ref 27.0–33.0)
MCHC: 32.9 g/dL (ref 32.0–36.0)
MCV: 92 fL (ref 80.0–100.0)
MPV: 10.1 fL (ref 7.5–12.5)
Monocytes Relative: 11.8 %
Neutro Abs: 5192 cells/uL (ref 1500–7800)
Neutrophils Relative %: 64.1 %
Platelets: 198 10*3/uL (ref 140–400)
RBC: 4.49 10*6/uL (ref 4.20–5.80)
RDW: 13.1 % (ref 11.0–15.0)
Total Lymphocyte: 21.6 %
WBC: 8.1 10*3/uL (ref 3.8–10.8)

## 2020-03-28 LAB — TSH: TSH: 1.5 mIU/L (ref 0.40–4.50)

## 2020-03-28 LAB — PSA: PSA: 0.2 ng/mL (ref <=4.0)

## 2020-03-28 MED ORDER — TRIAMCINOLONE ACETONIDE 0.1 % EX OINT: 1.0000 "application " | TOPICAL_OINTMENT | Freq: Two times a day (BID) | CUTANEOUS | 2 refills | Status: DC

## 2020-03-28 NOTE — Assessment & Plan Note (Signed)
No relapse. No HAs

## 2020-03-28 NOTE — Assessment & Plan Note (Signed)
PSA

## 2020-03-28 NOTE — Assessment & Plan Note (Signed)
Labs

## 2020-03-28 NOTE — Assessment & Plan Note (Signed)
Wt Readings from Last 3 Encounters:  03/28/20 181 lb 12.8 oz (82.5 kg)  01/06/20 183 lb (83 kg)  03/26/19 183 lb (83 kg)

## 2020-03-28 NOTE — Assessment & Plan Note (Signed)
Anticoagulation w/Eliquis

## 2020-03-28 NOTE — Progress Notes (Signed)
Subjective:  Patient ID: Jason Hawkins, male    DOB: 05/22/1928  Age: 86 y.o. MRN: [id number]  CC: Annual Exam   HPI Derec Mozingo presents for HTN, anticoagulation, CVA f/u  Outpatient Medications Prior to Visit  Medication Sig Dispense Refill  . ELIQUIS 2.5 MG TABS tablet TAKE 1 TABLET BY MOUTH TWICE A DAY 60 tablet 2  . Multiple Vitamin (MULTIVITAMIN) capsule Take 1 capsule by mouth daily.      . B Complex Vitamins (VITAMIN B COMPLEX PO) Take 1 tablet by mouth daily.  (Patient not taking: Reported on 03/28/2020)    . carvedilol (COREG) 3.125 MG tablet Take 1 tablet (3.125 mg total) by mouth 2 (two) times daily with a meal. (Patient not taking: Reported on 03/26/2019) 180 tablet 2  . Coenzyme Q10 (CO Q 10 PO) Take 1 capsule by mouth daily.  (Patient not taking: Reported on 03/28/2020)    . Omega-3 Fatty Acids (FISH OIL PO) Take 1 capsule by mouth daily.  (Patient not taking: Reported on 03/28/2020)     No facility-administered medications prior to visit.    ROS: Review of Systems  Constitutional: Positive for fatigue. Negative for appetite change and unexpected weight change.  HENT: Positive for hearing loss. Negative for congestion, nosebleeds, sneezing, sore throat and trouble swallowing.   Eyes: Negative for itching and visual disturbance.  Respiratory: Negative for cough.   Cardiovascular: Negative for chest pain, palpitations and leg swelling.  Gastrointestinal: Negative for abdominal distention, blood in stool, diarrhea and nausea.  Genitourinary: Negative for frequency and hematuria.  Musculoskeletal: Negative for back pain, gait problem, joint swelling and neck pain.  Skin: Negative for rash.  Neurological: Negative for dizziness, tremors, speech difficulty and weakness.  Psychiatric/Behavioral: Negative for agitation, dysphoric mood and sleep disturbance. The patient is not nervous/anxious.     Objective:  BP 130/66 (BP Location: Right Arm, Patient Position: Sitting,  Cuff Size: Normal)   Pulse 64   Temp 98 F (36.7 C) (Oral)   Ht 5\' 9"  (1.753 m)   Wt 181 lb 12.8 oz (82.5 kg)   SpO2 97%   BMI 26.85 kg/m   BP Readings from Last 3 Encounters:  03/28/20 130/66  01/06/20 126/80  03/26/19 (!) 148/82    Wt Readings from Last 3 Encounters:  03/28/20 181 lb 12.8 oz (82.5 kg)  01/06/20 183 lb (83 kg)  03/26/19 183 lb (83 kg)    Physical Exam Constitutional:      General: He is not in acute distress.    Appearance: He is well-developed.     Comments: NAD  Eyes:     Conjunctiva/sclera: Conjunctivae normal.     Pupils: Pupils are equal, round, and reactive to light.  Neck:     Thyroid: No thyromegaly.     Vascular: No JVD.  Cardiovascular:     Rate and Rhythm: Normal rate. Rhythm irregular.     Heart sounds: Normal heart sounds. No murmur heard.  No friction rub. No gallop.   Pulmonary:     Effort: Pulmonary effort is normal. No respiratory distress.     Breath sounds: Normal breath sounds. No wheezing or rales.  Chest:     Chest wall: No tenderness.  Abdominal:     General: Bowel sounds are normal. There is no distension.     Palpations: Abdomen is soft. There is no mass.     Tenderness: There is no abdominal tenderness. There is no guarding or rebound.  Musculoskeletal:  General: No tenderness. Normal range of motion.     Cervical back: Normal range of motion.  Lymphadenopathy:     Cervical: No cervical adenopathy.  Skin:    General: Skin is warm and dry.     Findings: No rash.  Neurological:     Mental Status: He is alert and oriented to person, place, and time.     Cranial Nerves: No cranial nerve deficit.     Motor: No abnormal muscle tone.     Coordination: Coordination normal.     Gait: Gait normal.     Deep Tendon Reflexes: Reflexes are normal and symmetric.  Psychiatric:        Behavior: Behavior normal.        Thought Content: Thought content normal.        Judgment: Judgment normal.   dry skin B LEs,  excoriations  Lab Results  Component Value Date   WBC 8.6 03/26/2019   HGB 14.2 03/26/2019   HCT 42.2 03/26/2019   PLT 206.0 03/26/2019   GLUCOSE 94 03/26/2019   CHOL 208 (H) 03/26/2019   TRIG 118.0 03/26/2019   HDL 61.80 03/26/2019   LDLCALC 123 (H) 03/26/2019   ALT 11 03/26/2019   AST 16 03/26/2019   NA 142 03/26/2019   K 4.3 03/26/2019   CL 104 03/26/2019   CREATININE 1.13 03/26/2019   BUN 24 (H) 03/26/2019   CO2 26 03/26/2019   TSH 1.95 03/26/2019   PSA 0.25 03/26/2019   INR 1.28 02/03/2016   HGBA1C 5.6 02/16/2016    US Renal  Result Date: 01/28/2017 CLINICAL DATA:  Recent UTI EXAM: RENAL / URINARY TRACT ULTRASOUND COMPLETE COMPARISON:  None. FINDINGS: Right Kidney: Length: 10 cm. Echogenicity within normal limits. No mass or hydronephrosis visualized. Left Kidney: Length: 10.9 cm. Echogenicity within normal limits. No mass or hydronephrosis visualized. Bladder: Appears normal for degree of bladder distention. Prostate is prominent indenting upon the inferior bladder. IMPRESSION: No acute abnormality noted.  Prominent prostate is seen. Electronically Signed   By: Inez Catalina M.D.   On: 01/28/2017 14:18    Assessment & Plan:   Bishop was seen today for annual exam.  Diagnoses and all orders for this visit:  Long term current use of anticoagulant therapy  Other orders -     triamcinolone ointment (KENALOG) 0.1 %; Apply 1 application topically 2 (two) times daily. Dry skin     Meds ordered this encounter  Medications  . triamcinolone ointment (KENALOG) 0.1 %    Sig: Apply 1 application topically 2 (two) times daily. Dry skin    Dispense:  80 g    Refill:  2     Follow-up: No follow-ups on file.  Walker Kehr, MD

## 2020-03-28 NOTE — Addendum Note (Signed)
Addended by: Cresenciano Lick on: 03/28/2020 02:52 PM   Modules accepted: Orders

## 2020-04-01 LAB — COMPLETE METABOLIC PANEL WITH GFR
AG Ratio: 1.5 (calc) (ref 1.0–2.5)
ALT: 9 U/L (ref 9–46)
AST: 16 U/L (ref 10–35)
Albumin: 4.1 g/dL (ref 3.6–5.1)
Alkaline phosphatase (APISO): 65 U/L (ref 35–144)
BUN/Creatinine Ratio: 15 (calc) (ref 6–22)
BUN: 17 mg/dL (ref 7–25)
CO2: 26 mmol/L (ref 20–32)
Calcium: 9.2 mg/dL (ref 8.6–10.3)
Chloride: 104 mmol/L (ref 98–110)
Creat: 1.15 mg/dL — ABNORMAL HIGH (ref 0.70–1.11)
GFR, Est African American: 65 mL/min/{1.73_m2} (ref >=60)
GFR, Est Non African American: 56 mL/min/{1.73_m2} — ABNORMAL LOW (ref >=60)
Globulin: 2.7 g/dL (calc) (ref 1.9–3.7)
Glucose, Bld: 100 mg/dL — ABNORMAL HIGH (ref 65–99)
Potassium: 4.1 mmol/L (ref 3.5–5.3)
Sodium: 138 mmol/L (ref 135–146)
Total Bilirubin: 1.6 mg/dL — ABNORMAL HIGH (ref 0.2–1.2)
Total Protein: 6.8 g/dL (ref 6.1–8.1)

## 2020-04-01 LAB — HEAVY METALS PROFILE, URINE

## 2020-04-01 LAB — ABO

## 2020-06-07 DIAGNOSIS — Z23 Encounter for immunization: Secondary | ICD-10-CM | POA: Diagnosis not present

## 2020-07-18 ENCOUNTER — Ambulatory Visit: Payer: Medicare Other | Admitting: Cardiovascular Disease

## 2020-07-26 MED ORDER — APIXABAN 2.5 MG PO TABS
2.5000 mg | ORAL_TABLET | Freq: Two times a day (BID) | ORAL | 3 refills | Status: DC
Start: 2020-07-26 — End: 2021-04-19

## 2020-08-23 ENCOUNTER — Other Ambulatory Visit: Payer: Self-pay

## 2020-08-23 NOTE — Progress Notes (Deleted)
Patient ID: Jason Hawkins, male   DOB: 09-Nov-1927, 87 y.o.   MRN: [id number]    87 y.o. not seen in close to 3 years  History of AVR and afib.  Had [facility] with coil embolization of artery on 02/01/16. Complicated by STEMI while in rehab. Coumadin changed to eliquis.  Cath with small 80% lesion in OM and occluded distal circumflex that was stented Last echo in July 2017 with EF 60-65% normal bioprosthetic valve with mean gradient 8 peak 17 mmHg   He still has a fatalistic attitude and does not really want to take any meds. Has not been same since his wife died   Discussed getting back on diuretics for his LE edema " I take a wizz enough as it is"  He does note taking his eliquis Not taking any beta blocker  ***  ROS: Denies fever, malais, weight loss, blurry vision, decreased visual acuity, cough, sputum, SOB, hemoptysis, pleuritic pain, palpitaitons, heartburn, abdominal pain, melena, lower extremity edema, claudication, or rash.  All other systems reviewed and negative  General: Affect appropriate Healthy:  appears stated age 58: normal Neck supple with no adenopathy JVP normal no bruits no thyromegaly Lungs clear with no wheezing and good diaphragmatic motion Heart:  I5/O2 click SEM  murmur, no rub, gallop or click PMI normal post sternotomy  Abdomen: benighn, BS positve, no tenderness, no AAA no bruit.  No HSM or HJR Distal pulses intact with no bruits Plus 2 bilateral  Edema with stasis  Neuro non-focal No muscular weakness   Current Outpatient Medications  Medication Sig Dispense Refill  . apixaban (ELIQUIS) 2.5 MG TABS tablet Take 1 tablet (2.5 mg total) by mouth 2 (two) times daily. 180 tablet 3  . B Complex Vitamins (VITAMIN B COMPLEX PO) Take 1 tablet by mouth daily.  (Patient not taking: Reported on 03/28/2020)    . carvedilol (COREG) 3.125 MG tablet Take 1 tablet (3.125 mg total) by mouth 2 (two) times daily with a meal. (Patient not taking: Reported on 03/26/2019) 180  tablet 2  . Coenzyme Q10 (CO Q 10 PO) Take 1 capsule by mouth daily.  (Patient not taking: Reported on 03/28/2020)    . Multiple Vitamin (MULTIVITAMIN) capsule Take 1 capsule by mouth daily.      . Omega-3 Fatty Acids (FISH OIL PO) Take 1 capsule by mouth daily.  (Patient not taking: Reported on 03/28/2020)    . triamcinolone ointment (KENALOG) 0.1 % Apply 1 application topically 2 (two) times daily. Dry skin 80 g 2   No current facility-administered medications for this visit.    Allergies  Bee venom, Codeine, and Lisinopril  Electrocardiogram:    Afib rate 60 PVC 01/06/20   Assessment and Plan AVR: normal valve clicks SBE  Echo should be updated  Edema  Venous disease refuses to take diuretic. Discussed elevation and low sodium diet  PAF:  ECG with afib today rate fine on eliquis for stroke prophylaxis  HLD Previously on statin diet Rx only at this point/age  CAD:  Stable no angina  Stent to distal circumflex 02/16/16  SAH:  No residual neurologic deficits. Fortunate coiled aneurysm so hopefully risk of recurrence On blood thinners will be less   Echo for AVR F/U in a year    F/U in 6 months    Jenkins Rouge

## 2020-08-23 NOTE — Telephone Encounter (Signed)
ERROR

## 2020-08-24 ENCOUNTER — Ambulatory Visit: Payer: Medicare Other | Admitting: Cardiovascular Disease

## 2020-09-02 ENCOUNTER — Ambulatory Visit: Payer: Medicare Other | Admitting: Cardiovascular Disease

## 2020-12-16 ENCOUNTER — Telehealth: Payer: Self-pay | Admitting: *Deleted

## 2020-12-16 NOTE — Telephone Encounter (Signed)
Rec'd PA for pt Eliquis. Submitted PA on cover-my-meds w/ (Key: [id number]). Received approval with Start Date:11/16/2020;Coverage End Date:12/16/2021.Marland KitchenJohny Chess

## 2020-12-19 ENCOUNTER — Other Ambulatory Visit: Payer: Self-pay

## 2020-12-19 ENCOUNTER — Ambulatory Visit (INDEPENDENT_AMBULATORY_CARE_PROVIDER_SITE_OTHER): Payer: Medicare Other | Admitting: Internal Medicine

## 2020-12-19 ENCOUNTER — Encounter: Payer: Self-pay | Admitting: Internal Medicine

## 2020-12-19 DIAGNOSIS — M25551 Pain in right hip: Secondary | ICD-10-CM | POA: Diagnosis not present

## 2020-12-19 DIAGNOSIS — I482 Chronic atrial fibrillation, unspecified: Secondary | ICD-10-CM

## 2020-12-19 DIAGNOSIS — G8929 Other chronic pain: Secondary | ICD-10-CM

## 2020-12-19 MED ORDER — VITAMIN D3 50 MCG (2000 UT) PO CAPS: 2000.0000 [IU] | ORAL_CAPSULE | Freq: Every day | ORAL | 3 refills | Status: AC

## 2020-12-19 NOTE — Progress Notes (Signed)
Subjective:  Patient ID: Jason Hawkins, male    DOB: 04-05-1928  Age: 87 y.o. MRN: [id number]  CC: Hip Pain ((R) Hip pain)   HPI Aayansh Milan presents for R hip pain with walking.  The patient is here with his daughter.  He is a vague historian.  He points to his groin crease on the right and the right lateral hip area.  His gait is appropriate for age.  Outpatient Medications Prior to Visit  Medication Sig Dispense Refill  . apixaban (ELIQUIS) 2.5 MG TABS tablet Take 1 tablet (2.5 mg total) by mouth 2 (two) times daily. 180 tablet 3  . Multiple Vitamin (MULTIVITAMIN) capsule Take 1 capsule by mouth daily.      . B Complex Vitamins (VITAMIN B COMPLEX PO) Take 1 tablet by mouth daily.  (Patient not taking: No sig reported)    . carvedilol (COREG) 3.125 MG tablet Take 1 tablet (3.125 mg total) by mouth 2 (two) times daily with a meal. (Patient not taking: No sig reported) 180 tablet 2  . Coenzyme Q10 (CO Q 10 PO) Take 1 capsule by mouth daily.  (Patient not taking: No sig reported)    . Omega-3 Fatty Acids (FISH OIL PO) Take 1 capsule by mouth daily.  (Patient not taking: No sig reported)    . triamcinolone ointment (KENALOG) 0.1 % Apply 1 application topically 2 (two) times daily. Dry skin (Patient not taking: Reported on 12/19/2020) 80 g 2   No facility-administered medications prior to visit.    ROS: Review of Systems  Constitutional: Negative for appetite change, fatigue and unexpected weight change.  HENT: Negative for congestion, nosebleeds, sneezing, sore throat and trouble swallowing.   Eyes: Negative for itching and visual disturbance.  Respiratory: Negative for cough.   Cardiovascular: Negative for chest pain, palpitations and leg swelling.  Gastrointestinal: Negative for abdominal distention, blood in stool, diarrhea and nausea.  Genitourinary: Negative for frequency and hematuria.  Musculoskeletal: Positive for arthralgias and gait problem. Negative for back pain, joint  swelling and neck pain.  Skin: Negative for rash.  Neurological: Negative for dizziness, tremors, speech difficulty and weakness.  Psychiatric/Behavioral: Negative for agitation, dysphoric mood, sleep disturbance and suicidal ideas. The patient is not nervous/anxious.     Objective:  BP (!) 148/82 (BP Location: Left Arm)   Pulse (!) 47   Wt 179 lb 6.4 oz (81.4 kg)   SpO2 98%   BMI 26.49 kg/m   BP Readings from Last 3 Encounters:  12/19/20 (!) 148/82  03/28/20 130/66  01/06/20 126/80    Wt Readings from Last 3 Encounters:  12/19/20 179 lb 6.4 oz (81.4 kg)  03/28/20 181 lb 12.8 oz (82.5 kg)  01/06/20 183 lb (83 kg)    Physical Exam Constitutional:      General: He is not in acute distress.    Appearance: He is well-developed.     Comments: NAD  Eyes:     Conjunctiva/sclera: Conjunctivae normal.     Pupils: Pupils are equal, round, and reactive to light.  Neck:     Thyroid: No thyromegaly.     Vascular: No JVD.  Cardiovascular:     Rate and Rhythm: Normal rate. Rhythm irregular.     Heart sounds: Normal heart sounds. No murmur heard. No friction rub. No gallop.   Pulmonary:     Effort: Pulmonary effort is normal. No respiratory distress.     Breath sounds: Normal breath sounds. No wheezing or rales.  Chest:  Chest wall: No tenderness.  Abdominal:     General: Bowel sounds are normal. There is no distension.     Palpations: Abdomen is soft. There is no mass.     Tenderness: There is no abdominal tenderness. There is no guarding or rebound.  Musculoskeletal:        General: No tenderness. Normal range of motion.     Cervical back: Normal range of motion.  Lymphadenopathy:     Cervical: No cervical adenopathy.  Skin:    General: Skin is warm and dry.     Findings: No rash.  Neurological:     Mental Status: He is alert and oriented to person, place, and time.     Cranial Nerves: No cranial nerve deficit.     Motor: No abnormal muscle tone.     Coordination:  Coordination normal.     Gait: Gait normal.     Deep Tendon Reflexes: Reflexes are normal and symmetric.  Psychiatric:        Behavior: Behavior normal.        Thought Content: Thought content normal.        Judgment: Judgment normal.    His gait is appropriate for age.  He looks younger than his stated age Both hips are with symmetric fairly good nontender range of motion.  Straight leg elevation is negative.  Lateral hip palpation over trochanter is normal in both sides  Lab Results  Component Value Date   WBC 8.1 03/28/2020   HGB 13.6 03/28/2020   HCT 41.3 03/28/2020   PLT 198 03/28/2020   GLUCOSE 100 (H) 03/28/2020   CHOL 208 (H) 03/26/2019   TRIG 118.0 03/26/2019   HDL 61.80 03/26/2019   LDLCALC 123 (H) 03/26/2019   ALT 9 03/28/2020   AST 16 03/28/2020   NA 138 03/28/2020   K 4.1 03/28/2020   CL 104 03/28/2020   CREATININE 1.15 (H) 03/28/2020   BUN 17 03/28/2020   CO2 26 03/28/2020   TSH 1.50 03/28/2020   PSA 0.2 03/28/2020   INR 1.28 02/03/2016   HGBA1C 5.6 02/16/2016    US Renal  Result Date: 01/28/2017 CLINICAL DATA:  Recent UTI EXAM: RENAL / URINARY TRACT ULTRASOUND COMPLETE COMPARISON:  None. FINDINGS: Right Kidney: Length: 10 cm. Echogenicity within normal limits. No mass or hydronephrosis visualized. Left Kidney: Length: 10.9 cm. Echogenicity within normal limits. No mass or hydronephrosis visualized. Bladder: Appears normal for degree of bladder distention. Prostate is prominent indenting upon the inferior bladder. IMPRESSION: No acute abnormality noted.  Prominent prostate is seen. Electronically Signed   By: Inez Catalina M.D.   On: 01/28/2017 14:18    Assessment & Plan:     Follow-up: No follow-ups on file.  Walker Kehr, MD

## 2020-12-19 NOTE — Patient Instructions (Signed)
Turmeric Black cherry extract

## 2020-12-19 NOTE — Assessment & Plan Note (Addendum)
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  Probably worse. X-ray was offered and declined Medrol pack was offered and declined I advised him to try turmeric and Black cherry extract.  The patient can take Tylenol as needed Come back if problems

## 2020-12-20 ENCOUNTER — Encounter: Payer: Self-pay | Admitting: Internal Medicine

## 2020-12-20 NOTE — Assessment & Plan Note (Signed)
The patient continues to take Eliquis

## 2021-01-23 ENCOUNTER — Ambulatory Visit (INDEPENDENT_AMBULATORY_CARE_PROVIDER_SITE_OTHER): Payer: Medicare Other | Admitting: Family

## 2021-01-23 ENCOUNTER — Other Ambulatory Visit: Payer: Self-pay

## 2021-01-23 ENCOUNTER — Encounter: Payer: Self-pay | Admitting: Family

## 2021-01-23 VITALS — BP 176/84 | HR 56 | Ht 69.0 in | Wt 179.4 lb

## 2021-01-23 DIAGNOSIS — Z952 Presence of prosthetic heart valve: Secondary | ICD-10-CM

## 2021-01-23 DIAGNOSIS — I25118 Atherosclerotic heart disease of native coronary artery with other forms of angina pectoris: Secondary | ICD-10-CM | POA: Diagnosis not present

## 2021-01-23 DIAGNOSIS — I10 Essential (primary) hypertension: Secondary | ICD-10-CM

## 2021-01-23 DIAGNOSIS — Z7901 Long term (current) use of anticoagulants: Secondary | ICD-10-CM | POA: Diagnosis not present

## 2021-01-23 DIAGNOSIS — I4819 Other persistent atrial fibrillation: Secondary | ICD-10-CM | POA: Diagnosis not present

## 2021-01-23 MED ORDER — LOSARTAN POTASSIUM 25 MG PO TABS: 25.0000 mg | ORAL_TABLET | Freq: Every day | ORAL | 3 refills | Status: DC

## 2021-01-23 NOTE — Progress Notes (Signed)
Office Visit    Patient Name: Jason Hawkins Date of Encounter: 01/23/2021  PCP:  Cassandria Anger, MD   [place]  Cardiologist:  Jenkins Rouge, MD  Advanced Practice Provider:  No care team member to display Electrophysiologist:  None   Chief Complaint    Jason Hawkins is a 87 y.o. male with a hx of AVR, atrial fibrillation, SAH with coil embolization of artery 02/01/16, CAD s/p STEMI and cath with small 80% lesion in OM and occluded distal Cx treated with stent presents today for follow up of AVR, CAD.   Past Medical History    Past Medical History:  Diagnosis Date   Bradycardia    a. prior to STEMI 02/2016 -> improved with stenting.   Brain aneurysm    a. s/p coiling 01/2016.   CAD (coronary artery disease)    a. STEMI 02/2016 while on rehab s/p DES to Cx, EF 60-65%.   Chronic atrial fibrillation (HCC)    HTN (hypertension)    Hypercholesteremia    Lower extremity edema    Severe aortic stenosis    a. s/p aortic valve replacement using a pericardial tissue valve   Subarachnoid bleed (HCC)    a. due to brain aneurysm 01/2016.   Past Surgical History:  Procedure Laterality Date   AORTIC VALVE REPLACEMENT  08/30/2003   Dr. Cyndia Bent   CARDIAC CATHETERIZATION N/A 02/16/2016   Procedure: Left Heart Cath and Coronary Angiography;  Surgeon: Peter M Martinique, MD;  Location: [place] [facility];  Service: Cardiovascular;  Laterality: N/A;   CARDIAC CATHETERIZATION N/A 02/16/2016   Procedure: Coronary Stent Intervention;  Surgeon: Peter M Martinique, MD;  Location: [place] [facility];  Service: Cardiovascular;  Laterality: N/A;   IR GENERIC HISTORICAL  02/01/2016   IR TRANSCATH/EMBOLIZ 02/01/2016 [facility]   IR GENERIC HISTORICAL  02/01/2016   IR ANGIOGRAM FOLLOW UP STUDY 02/01/2016 [facility]   IR GENERIC HISTORICAL  02/01/2016   IR ANGIOGRAM FOLLOW UP STUDY 02/01/2016 [facility]   IR GENERIC HISTORICAL  02/01/2016   IR ANGIO INTRA EXTRACRAN SEL  INTERNAL CAROTID BILAT MOD SED 02/01/2016 [facility]   IR GENERIC HISTORICAL  02/01/2016   IR NEURO EACH ADD'L AFTER BASIC UNI LEFT (MS) 02/01/2016 [facility]   RADIOLOGY WITH ANESTHESIA N/A 02/01/2016   Procedure: RADIOLOGY WITH ANESTHESIA;  Surgeon: Consuella Lose, MD;  Location: [place];  Service: Radiology;  Laterality: N/A;    Allergies  Allergies  Allergen Reactions   Bee Venom Anaphylaxis   Codeine Nausea Only   Lisinopril Other (See Comments)    Per patient bp went to zero    History of Present Illness    Jason Hawkins is a 87 y.o. male with a hx of AVR, atrial fibrillation, SAH with coil embolization of artery 02/01/16, CAD s/p STEMI and cath with small 80% lesion in OM and occluded distal Cx treated with stent  last seen 01/06/20 by Dr. Johnsie Cancel.  He was last seen 01/06/20 by Dr. Johnsie Cancel. He was noted to have a fatalistic attitude and was not interested in medication. He declined diuretic for edema though was compliant with Eliquis.   03/2020 lab work:  Creatinine 1.15, GFR 56, K 4.1, Na 138, Hb 13.6, TSH 1.5  He presents today for follow up with his daughter in law. Reports no shortness of breath at rest and stable dyspnea on exertion. Reports no chest pain, pressure, or tightness. No edema, orthopnea, PND. Reports no palpitations. Denies  bleeding complications.  Self decreased his Eliquis to once per day and we discussed the strong indication to take twice per day.  He tells me he will consider it.  He is overall very hesitant regarding medication changes.  Reviewed his very elevated blood pressure and the indication for better blood pressure control.  He is agreeable to start low-dose blood pressure medication.  His daughter-in-law tells me that have blood pressure cuff and will assist him to check.  EKGs/Labs/Other Studies Reviewed:   The following studies were reviewed today:  Echo 02/2016 - Left ventricle: The cavity size was normal. Wall thickness was    normal. Systolic  function was normal. The estimated ejection    fraction was in the range of 60% to 65%.  - Aortic valve: A bioprosthesis was present.  - Right atrium: The atrium was severely dilated.   EKG:  EKG is ordered today.  The ekg ordered today demonstrates rate controlled atrial fibrillation 57 bpm with no acute ST/T wave changes.   Recent Labs: 03/28/2020: ALT 9; BUN 17; Creat 1.15; Hemoglobin 13.6; Platelets 198; Potassium 4.1; Sodium 138; TSH 1.50  Recent Lipid Panel    Component Value Date/Time   CHOL 208 (H) 03/26/2019 1145   TRIG 118.0 03/26/2019 1145   HDL 61.80 03/26/2019 1145   CHOLHDL 3 03/26/2019 1145   VLDL 23.6 03/26/2019 1145   LDLCALC 123 (H) 03/26/2019 1145    Home Medications   Current Meds  Medication Sig   apixaban (ELIQUIS) 2.5 MG TABS tablet Take 1 tablet (2.5 mg total) by mouth 2 (two) times daily.   Cholecalciferol (VITAMIN D3) 50 MCG (2000 UT) capsule Take 1 capsule (2,000 Units total) by mouth daily.   Multiple Vitamin (MULTIVITAMIN) capsule Take 1 capsule by mouth daily.     [DISCONTINUED] testosterone (ANDRODERM) 2.5 MG/24HR Place 1 patch onto the skin daily.     Review of Systems   All other systems reviewed and are otherwise negative except as noted above.  Physical Exam    VS:  BP (!) 180/90   Pulse (!) 56   Wt 179 lb 6.4 oz (81.4 kg)   SpO2 99%   BMI 26.49 kg/m  , BMI Body mass index is 26.49 kg/m.  Wt Readings from Last 3 Encounters:  01/23/21 179 lb 6.4 oz (81.4 kg)  12/19/20 179 lb 6.4 oz (81.4 kg)  03/28/20 181 lb 12.8 oz (82.5 kg)    GEN: Well nourished, well developed, in no acute distress. HEENT: normal. Neck: Supple, no JVD, carotid bruits, or masses. Cardiac: irregularly irregular, no murmurs, rubs, or gallops. No clubbing, cyanosis, edema.  Radials/DP/PT 2+ and equal bilaterally.  Respiratory:  Respirations regular and unlabored, clear to auscultation bilaterally. GI: Soft, nontender, nondistended. MS: No deformity or  atrophy. Skin: Warm and dry, no rash. Neuro:  Strength and sensation are intact. Psych: Normal affect.  Assessment & Plan    S/p AVR - No murmur on exam.  Continue optimal BP and volume control.  HTN -BP markedly elevated.  Hesitant regarding medication changes but agreeable to start losartan 25 mg daily.  Encouraged to monitor blood pressure at home and he will report blood pressure consistently greater than 140/90.  Lower extremity edema / Venous insufficiency - Declines diuretic. Minimal pedal edema on exam today.  Low-salt diet and elevating of lower extremities encouraged.   Persistent atrial fibrillation / Chronic anticoagulation -EKG rate controlled atrial fibrillation today.  Asymptomatic with no palpitations.  Continue rate control.  No  need for AV nodal blocking agent.  Continue Eliquis 2.5 mg twice daily.  Of note he has been taking once per day and was encouraged to take twice per day.  I am not certain he meets dose reduction criteria as his weight is greater than 60 kg and his creatinine is less than 1.5 however will discuss with primary cardiologist as his primary care provider is the one prescribing.  I honestly have low suspicion he would be agreeable to take a higher dose of Eliquis.  HLD - Previously on statin. Diet and lifestyle changes only at this time given age.   SAH - Previous coil stenting.   Disposition: Follow up in 3-4 month(s) with Dr. Johnsie Cancel or APP for BP recheck  Signed, Loel Dubonnet, NP 01/23/2021, 3:55 PM [place]

## 2021-01-23 NOTE — Patient Instructions (Addendum)
Medication Instructions:   Your physician has recommended you make the following change in your medication:  Recommend taking your Eliquis twice daily as prescribed.   START Losartan 25mg  one tablet daily  *If you need a refill on your cardiac medications before your next appointment, please call your pharmacy*   Lab Work: None ordered today.   Testing/Procedures: Your EKG today shows rate controlled atrial fibrillation.    Follow-Up: At [facility], you and your health needs are our priority.  As part of our continuing mission to provide you with exceptional heart care, we have created designated Provider Care Teams.  These Care Teams include your primary Cardiologist (physician) and Advanced Practice Providers (APPs -  Physician Assistants and Nurse Practitioners) who all work together to provide you with the care you need, when you need it.  We recommend signing up for the patient portal called "MyChart".  Sign up information is provided on this After Visit Summary.  MyChart is used to connect with patients for Virtual Visits (Telemedicine).  Patients are able to view lab/test results, encounter notes, upcoming appointments, etc.  Non-urgent messages can be sent to your provider as well.   To learn more about what you can do with MyChart, go to [email or web address].    Your next appointment:   3 months   05/10/2021 arrive at 11:00  The format for your next appointment:   In Person  Provider:   You may see Jenkins Rouge, MD or one of the following Advanced Practice Providers on your designated Care Team:   Kathyrn Drown, NP   Other Instructions  Heart Healthy Diet Recommendations: A low-salt diet is recommended. Meats should be grilled, baked, or boiled. Avoid fried foods. Focus on lean protein sources like fish or chicken with vegetables and fruits. The American Heart Association is a Microbiologist!  American Heart Association Diet and Lifeystyle Recommendations    Exercise recommendations: The American Heart Association recommends 150 minutes of moderate intensity exercise weekly. Try 30 minutes of moderate intensity exercise 4-5 times per week. This could include walking, jogging, or swimming.  Tips to Measure your Blood Pressure Correctly  Check blood pressure and contact our office if your blood pressure is consistently more than 140/90 when checked at least 1-2 hours after your medications.   Here's what you can do to ensure a correct reading:  Don't drink a caffeinated beverage or smoke during the 30 minutes before the test.  Sit quietly for five minutes before the test begins.  During the measurement, sit in a chair with your feet on the floor and your arm supported so your elbow is at about heart level.  The inflatable part of the cuff should completely cover at least 80% of your upper arm, and the cuff should be placed on bare skin, not over a shirt.  Don't talk during the measurement.  Have your blood pressure measured twice, with a brief break in between. If the readings are different by 5 points or more, have it done a third time.  Blood pressure categories  Blood pressure category SYSTOLIC (upper number)  DIASTOLIC (lower number)  Normal Less than 120 mm Hg and Less than 80 mm Hg  Elevated 120-129 mm Hg and Less than 80 mm Hg  High blood pressure: Stage 1 hypertension 130-139 mm Hg or 80-89 mm Hg  High blood pressure: Stage 2 hypertension 140 mm Hg or higher or 90 mm Hg or higher  Hypertensive crisis (consult your doctor immediately) Higher than  180 mm Hg and/or Higher than 120 mm Hg  Source: American Heart Association and American Stroke Association. For more on getting your blood pressure under control, buy Controlling Your Blood Pressure, a Special Health Report from [facility].   Blood Pressure Log   Date   Time  Blood Pressure  Position  Example: Nov 1 9 AM 124/78 sitting

## 2021-03-15 ENCOUNTER — Encounter (HOSPITAL_BASED_OUTPATIENT_CLINIC_OR_DEPARTMENT_OTHER): Payer: Self-pay

## 2021-03-15 DIAGNOSIS — I10 Essential (primary) hypertension: Secondary | ICD-10-CM

## 2021-03-17 ENCOUNTER — Other Ambulatory Visit: Payer: Self-pay

## 2021-03-17 ENCOUNTER — Other Ambulatory Visit: Payer: Self-pay | Admitting: Family

## 2021-03-17 ENCOUNTER — Other Ambulatory Visit: Payer: Medicare Other | Admitting: *Deleted

## 2021-03-17 DIAGNOSIS — I10 Essential (primary) hypertension: Secondary | ICD-10-CM | POA: Diagnosis not present

## 2021-03-20 LAB — BASIC METABOLIC PANEL
BUN/Creatinine Ratio: 12 (ref 10–24)
BUN: 13 mg/dL (ref 10–36)
CO2: 23 mmol/L (ref 20–29)
Calcium: 9.3 mg/dL (ref 8.6–10.2)
Chloride: 95 mmol/L — ABNORMAL LOW (ref 96–106)
Creatinine, Ser: 1.07 mg/dL (ref 0.76–1.27)
Glucose: 88 mg/dL (ref 65–99)
Potassium: 4.1 mmol/L (ref 3.5–5.2)
Sodium: 133 mmol/L — ABNORMAL LOW (ref 134–144)
eGFR: 66 mL/min/{1.73_m2} (ref >59)

## 2021-03-24 MED ORDER — LOSARTAN POTASSIUM 50 MG PO TABS: 75.0000 mg | ORAL_TABLET | Freq: Every day | ORAL | 1 refills | Status: DC

## 2021-03-24 NOTE — Addendum Note (Signed)
Addended by: Loel Dubonnet on: 03/24/2021 04:50 PM   Modules accepted: Orders

## 2021-04-06 ENCOUNTER — Ambulatory Visit (INDEPENDENT_AMBULATORY_CARE_PROVIDER_SITE_OTHER): Payer: Medicare Other | Admitting: Internal Medicine

## 2021-04-06 ENCOUNTER — Other Ambulatory Visit: Payer: Self-pay

## 2021-04-06 ENCOUNTER — Encounter: Payer: Self-pay | Admitting: Internal Medicine

## 2021-04-06 VITALS — BP 162/68 | HR 50 | Wt 181.8 lb

## 2021-04-06 DIAGNOSIS — I25118 Atherosclerotic heart disease of native coronary artery with other forms of angina pectoris: Secondary | ICD-10-CM

## 2021-04-06 DIAGNOSIS — I10 Essential (primary) hypertension: Secondary | ICD-10-CM | POA: Diagnosis not present

## 2021-04-06 DIAGNOSIS — G8929 Other chronic pain: Secondary | ICD-10-CM | POA: Diagnosis not present

## 2021-04-06 DIAGNOSIS — Z23 Encounter for immunization: Secondary | ICD-10-CM | POA: Diagnosis not present

## 2021-04-06 DIAGNOSIS — H6521 Chronic serous otitis media, right ear: Secondary | ICD-10-CM | POA: Diagnosis not present

## 2021-04-06 DIAGNOSIS — H6690 Otitis media, unspecified, unspecified ear: Secondary | ICD-10-CM | POA: Insufficient documentation

## 2021-04-06 DIAGNOSIS — M25551 Pain in right hip: Secondary | ICD-10-CM

## 2021-04-06 DIAGNOSIS — I482 Chronic atrial fibrillation, unspecified: Secondary | ICD-10-CM

## 2021-04-06 MED ORDER — CEFDINIR 300 MG PO CAPS: 300.0000 mg | ORAL_CAPSULE | Freq: Two times a day (BID) | ORAL | 0 refills | Status: DC

## 2021-04-06 MED ORDER — METHYLPREDNISOLONE 4 MG PO TBPK: ORAL_TABLET | ORAL | 0 refills | Status: DC

## 2021-04-06 NOTE — Assessment & Plan Note (Signed)
Resolved

## 2021-04-06 NOTE — Progress Notes (Signed)
Subjective:  Patient ID: Jason Hawkins, male    DOB: Jan 29, 1928  Age: 87 y.o. MRN: [id number]  CC: Ear Fullness (Daughter states he has been dealing with congestion & allergies. Allergist ? If he has ear infection) and Medication Refill (Req refill on[facility]iquis)   HPI Jason Hawkins presents for R hip pain - better C/o R ear discomfort x weeks or months, hearing loss Pt has ENT appt in October   Outpatient Medications Prior to Visit  Medication Sig Dispense Refill   apixaban (ELIQUIS) 2.5 MG TABS tablet Take 1 tablet (2.5 mg total) by mouth 2 (two) times daily. 180 tablet 3   Cholecalciferol (VITAMIN D3) 50 MCG (2000 UT) capsule Take 1 capsule (2,000 Units total) by mouth daily. 100 capsule 3   losartan (COZAAR) 50 MG tablet Take 1.5 tablets (75 mg total) by mouth daily. 135 tablet 1   Multiple Vitamin (MULTIVITAMIN) capsule Take 1 capsule by mouth daily.       No facility-administered medications prior to visit.    ROS: Review of Systems  Constitutional:  Negative for appetite change, fatigue and unexpected weight change.  HENT:  Positive for hearing loss. Negative for congestion, nosebleeds, sneezing, sore throat and trouble swallowing.   Eyes:  Negative for itching and visual disturbance.  Respiratory:  Negative for cough.   Cardiovascular:  Negative for chest pain, palpitations and leg swelling.  Gastrointestinal:  Negative for abdominal distention, blood in stool, diarrhea and nausea.  Genitourinary:  Negative for frequency and hematuria.  Musculoskeletal:  Negative for back pain, gait problem, joint swelling and neck pain.  Skin:  Negative for rash.  Neurological:  Negative for dizziness, tremors, speech difficulty and weakness.  Psychiatric/Behavioral:  Negative for agitation, dysphoric mood and sleep disturbance. The patient is not nervous/anxious.    Objective:  BP (!) 162/68 (BP Location: Left Arm)   Pulse (!) 50   Wt 181 lb 12.8 oz (82.5 kg)   SpO2 96%   BMI 26.85  kg/m   BP Readings from Last 3 Encounters:  04/06/21 (!) 162/68  01/23/21 (!) 176/84  12/19/20 (!) 148/82    Wt Readings from Last 3 Encounters:  04/06/21 181 lb 12.8 oz (82.5 kg)  01/23/21 179 lb 6.4 oz (81.4 kg)  12/19/20 179 lb 6.4 oz (81.4 kg)    Physical Exam Constitutional:      General: He is not in acute distress.    Appearance: Normal appearance. He is well-developed.     Comments: NAD  HENT:     Right Ear: Ear canal normal.     Left Ear: Tympanic membrane and ear canal normal.  Eyes:     Conjunctiva/sclera: Conjunctivae normal.     Pupils: Pupils are equal, round, and reactive to light.  Neck:     Thyroid: No thyromegaly.     Vascular: No JVD.  Cardiovascular:     Rate and Rhythm: Normal rate and regular rhythm.     Heart sounds: Normal heart sounds. No murmur heard.   No friction rub. No gallop.  Pulmonary:     Effort: Pulmonary effort is normal. No respiratory distress.     Breath sounds: Normal breath sounds. No wheezing or rales.  Chest:     Chest wall: No tenderness.  Abdominal:     General: Bowel sounds are normal. There is no distension.     Palpations: Abdomen is soft. There is no mass.     Tenderness: There is no abdominal tenderness. There is no guarding  or rebound.  Musculoskeletal:        General: No tenderness. Normal range of motion.     Cervical back: Normal range of motion.  Lymphadenopathy:     Cervical: No cervical adenopathy.  Skin:    General: Skin is warm and dry.     Findings: No rash.  Neurological:     Mental Status: He is alert and oriented to person, place, and time.     Cranial Nerves: No cranial nerve deficit.     Motor: No abnormal muscle tone.     Coordination: Coordination normal.     Gait: Gait normal.     Deep Tendon Reflexes: Reflexes are normal and symmetric.  Psychiatric:        Behavior: Behavior normal.        Thought Content: Thought content normal.        Judgment: Judgment normal.  R TM is scarred Hard  hearing  Lab Results  Component Value Date   WBC 8.1 03/28/2020   HGB 13.6 03/28/2020   HCT 41.3 03/28/2020   PLT 198 03/28/2020   GLUCOSE 88 03/17/2021   CHOL 208 (H) 03/26/2019   TRIG 118.0 03/26/2019   HDL 61.80 03/26/2019   LDLCALC 123 (H) 03/26/2019   ALT 9 03/28/2020   AST 16 03/28/2020   NA 133 (L) 03/17/2021   K 4.1 03/17/2021   CL 95 (L) 03/17/2021   CREATININE 1.07 03/17/2021   BUN 13 03/17/2021   CO2 23 03/17/2021   TSH 1.50 03/28/2020   PSA 0.2 03/28/2020   INR 1.28 02/03/2016   HGBA1C 5.6 02/16/2016    US Renal  Result Date: 01/28/2017 CLINICAL DATA:  Recent UTI EXAM: RENAL / URINARY TRACT ULTRASOUND COMPLETE COMPARISON:  None. FINDINGS: Right Kidney: Length: 10 cm. Echogenicity within normal limits. No mass or hydronephrosis visualized. Left Kidney: Length: 10.9 cm. Echogenicity within normal limits. No mass or hydronephrosis visualized. Bladder: Appears normal for degree of bladder distention. Prostate is prominent indenting upon the inferior bladder. IMPRESSION: No acute abnormality noted.  Prominent prostate is seen. Electronically Signed   By: Inez Catalina M.D.   On: 01/28/2017 14:18    Assessment & Plan:     Walker Kehr, MD

## 2021-04-06 NOTE — Assessment & Plan Note (Addendum)
C/o R ear discomfort x weeks or months, hearing loss Pt has ENT appt in October  Trial of po Omnicef. Medro pack ENT f/u

## 2021-04-06 NOTE — Addendum Note (Signed)
Addended by: Earnstine Regal on: 04/06/2021 03:15 PM   Modules accepted: Orders

## 2021-04-19 MED ORDER — APIXABAN 2.5 MG PO TABS: 2.5000 mg | ORAL_TABLET | Freq: Two times a day (BID) | ORAL | 3 refills | Status: DC

## 2021-04-19 NOTE — Addendum Note (Signed)
Addended by: Earnstine Regal on: 04/19/2021 11:05 AM   Modules accepted: Orders

## 2021-04-20 ENCOUNTER — Encounter: Payer: Self-pay | Admitting: Family Medicine

## 2021-04-20 ENCOUNTER — Telehealth (INDEPENDENT_AMBULATORY_CARE_PROVIDER_SITE_OTHER): Payer: Medicare Other | Admitting: Family Medicine

## 2021-04-20 VITALS — BP 163/98

## 2021-04-20 DIAGNOSIS — I25118 Atherosclerotic heart disease of native coronary artery with other forms of angina pectoris: Secondary | ICD-10-CM | POA: Diagnosis not present

## 2021-04-20 DIAGNOSIS — U071 COVID-19: Secondary | ICD-10-CM

## 2021-04-20 MED ORDER — BENZONATATE 100 MG PO CAPS: 100.0000 mg | ORAL_CAPSULE | Freq: Three times a day (TID) | ORAL | 0 refills | Status: DC | PRN

## 2021-04-20 MED ORDER — MOLNUPIRAVIR EUA 200MG CAPSULE: 4.0000 | ORAL_CAPSULE | Freq: Two times a day (BID) | ORAL | 0 refills | Status: AC

## 2021-04-20 NOTE — Patient Instructions (Signed)
HOME CARE TIPS:  -I sent the medication(s) we discussed to your pharmacy: Meds ordered this encounter  Medications   molnupiravir EUA (LAGEVRIO) 200 mg CAPS capsule    Sig: Take 4 capsules (800 mg total) by mouth 2 (two) times daily for 5 days.    Dispense:  40 capsule    Refill:  0   benzonatate (TESSALON PERLES) 100 MG capsule    Sig: Take 1 capsule (100 mg total) by mouth 3 (three) times daily as needed.    Dispense:  20 capsule    Refill:  0     -I sent in the Covid19 treatment or referral you requested per our discussion. Please see the information provided below and discuss further with the pharmacist/treatment team.   -there is a chance of rebound illness after finishing your treatment. If you become sick again please isolate for an additional 5 days.   -can use tylenol  if needed for fevers, aches and pains per instructions  -can use nasal saline a few times per day if you have nasal congestion  -stay hydrated, drink plenty of fluids and eat small healthy meals - avoid dairy  -follow up with your doctor in 2-3 days unless improving and feeling better  -stay home while sick, except to seek medical care. If you have COVID19, ideally it would be best to stay home for a full 10 days since the onset of symptoms PLUS one day of no fever and feeling better. Wear a good mask that fits snugly (such as N95 or KN95) if around others to reduce the risk of transmission.  It was nice to meet you today, and I really hope you are feeling better soon. I help [facility] out with telemedicine visits on Tuesdays and Thursdays and am available for visits on those days. If you have any concerns or questions following this visit please schedule a follow up visit with your Primary Care doctor or seek care at a local urgent care clinic to avoid delays in care.    Seek in person care or schedule a follow up video visit promptly if your symptoms worsen, new concerns arise or you are not improving with  treatment. Call 911 and/or seek emergency care if your symptoms are severe or life threatening.    Fact Sheet for Patients And Caregivers Emergency Use Authorization (EUA) Of LAGEVRIOT (molnupiravir) capsules For Coronavirus Disease 2019 (COVID-19)  What is the most important information I should know about LAGEVRIO? LAGEVRIO may cause serious side effects, including: ? LAGEVRIO may cause harm to your unborn baby. It is not known if LAGEVRIO will harm your baby if you take LAGEVRIO during pregnancy. o LAGEVRIO is not recommended for use in pregnancy. o LAGEVRIO has not been studied in pregnancy. LAGEVRIO was studied in pregnant animals only. When LAGEVRIO was given to pregnant animals, LAGEVRIO caused harm to their unborn babies. o You and your healthcare provider may decide that you should take LAGEVRIO during pregnancy if there are no other COVID-19 treatment options approved or authorized by the FDA that are accessible or clinically appropriate for you. o If you and your healthcare provider decide that you should take LAGEVRIO during pregnancy, you and your healthcare provider should discuss the known and potential benefits and the potential risks of taking LAGEVRIO during pregnancy. For individuals who are able to become pregnant: ? You should use a reliable method of birth control (contraception) consistently and correctly during treatment with LAGEVRIO and for 4 days after the last   dose of LAGEVRIO. Talk to your healthcare provider about reliable birth control methods. ? Before starting treatment with LAGEVRIO your healthcare provider may do a pregnancy test to see if you are pregnant before starting treatment with LAGEVRIO. ? Tell your healthcare provider right away if you become pregnant or think you may be pregnant during treatment with LAGEVRIO. Pregnancy Surveillance Program: ? There is a pregnancy surveillance program for individuals who take LAGEVRIO during pregnancy. The  purpose of this program is to collect information about the health of you and your baby. Talk to your healthcare provider about how to take part in this program. ? If you take LAGEVRIO during pregnancy and you agree to participate in the pregnancy surveillance program and allow your healthcare provider to share your information with [employer], then your healthcare provider will report your use of LAGEVRIO during pregnancy to [employer] by calling 1-877-888-4231 or pregnancyreporting.msd.com. For individuals who are sexually active with partners who are able to become pregnant: ? It is not known if LAGEVRIO can affect sperm. While the risk is regarded as low, animal studies to fully assess the potential for LAGEVRIO to affect the babies of males treated with LAGEVRIO have not been completed. A reliable method of birth control (contraception) should be used consistently and correctly during treatment with LAGEVRIO and for at least 3 months after the last dose. The risk to sperm beyond 3 months is not known. Studies to understand the risk to sperm beyond 3 months are ongoing. Talk to your healthcare provider about reliable birth control methods. Talk to your healthcare provider if you have questions or concerns about how LAGEVRIO may affect sperm. You are being given this fact sheet because your healthcare provider believes it is necessary to provide you with LAGEVRIO for the treatment of adults with mild-to-moderate coronavirus disease 2019 (COVID-19) with positive results of direct SARS-CoV-2 viral testing, and who are at high risk for progression to severe COVID-19 including hospitalization or death, and for whom other COVID-19 treatment options approved or authorized by the FDA are not accessible or clinically appropriate. The [place].S. Food and Drug Administration (FDA) has issued an Emergency Use Authorization (EUA) to make LAGEVRIO available during the COVID-19 pandemic  (for more details about an EUA please see "What is an Emergency Use Authorization?" at the end of this document). LAGEVRIO is not an FDA-approved medicine in the [place]. Read this Fact Sheet for information about LAGEVRIO. Talk to your healthcare provider about your options if you have any questions. It is your choice to take LAGEVRIO.  What is COVID-19? COVID-19 is caused by a virus called a coronavirus. You can get COVID-19 through close contact with another person who has the virus. COVID-19 illnesses have ranged from very mild-to-severe, including illness resulting in death. While information so far suggests that most COVID-19 illness is mild, serious illness can happen and may cause some of your other medical conditions to become worse. Older people and people of all ages with severe, long lasting (chronic) medical conditions like heart disease, lung disease and diabetes, for example seem to be at higher risk of being hospitalized for COVID-19.  What is LAGEVRIO? LAGEVRIO is an investigational medicine used to treat mild-to-moderate COVID-19 in adults: ? with positive results of direct SARS-CoV-2 viral testing, and ? who are at high risk for progression to severe COVID-19 including hospitalization or death, and for whom other COVID-19 treatment options approved or authorized by the FDA are not   accessible or clinically appropriate. The FDA has authorized the emergency use of LAGEVRIO for the treatment of mild-tomoderate COVID-19 in adults under an EUA. For more information on EUA, see the "What is an Emergency Use Authorization (EUA)?" section at the end of this Fact Sheet. LAGEVRIO is not authorized: ? for use in people less than 18 years of age. ? for prevention of COVID-19. ? for people needing hospitalization for COVID-19. ? for use for longer than 5 consecutive days.  What should I tell my healthcare provider before I take LAGEVRIO? Tell your healthcare provider if  you: ? Have any allergies ? Are breastfeeding or plan to breastfeed ? Have any serious illnesses ? Are taking any medicines (prescription, over-the-counter, vitamins, or herbal products).  How do I take LAGEVRIO? ? Take LAGEVRIO exactly as your healthcare provider tells you to take it. ? Take 4 capsules of LAGEVRIO every 12 hours (for example, at 8 am and at 8 pm) ? Take LAGEVRIO for 5 days. It is important that you complete the full 5 days of treatment with LAGEVRIO. Do not stop taking LAGEVRIO before you complete the full 5 days of treatment, even if you feel better. ? Take LAGEVRIO with or without food. ? You should stay in isolation for as long as your healthcare provider tells you to. Talk to your healthcare provider if you are not sure about how to properly isolate while you have COVID-19. ? Swallow LAGEVRIO capsules whole. Do not open, break, or crush the capsules. If you cannot swallow capsules whole, tell your healthcare provider. ? What to do if you miss a dose: o If it has been less than 10 hours since the missed dose, take it as soon as you remember o If it has been more than 10 hours since the missed dose, skip the missed dose and take your dose at the next scheduled time. ? Do not double the dose of LAGEVRIO to make up for a missed dose.  What are the important possible side effects of LAGEVRIO? ? See, "What is the most important information I should know about LAGEVRIO?" ? Allergic Reactions. Allergic reactions can happen in people taking LAGEVRIO, even after only 1 dose. Stop taking LAGEVRIO and call your healthcare provider right away if you get any of the following symptoms of an allergic reaction: o hives o rapid heartbeat o trouble swallowing or breathing o swelling of the mouth, lips, or face o throat tightness o hoarseness o skin rash The most common side effects of LAGEVRIO are: ? diarrhea ? nausea ? dizziness These are not all the possible side effects  of LAGEVRIO. Not many people have taken LAGEVRIO. Serious and unexpected side effects may happen. This medicine is still being studied, so it is possible that all of the risks are not known at this time.  What other treatment choices are there?  Veklury (remdesivir) is FDA-approved as an intravenous (IV) infusion for the treatment of mildto-moderate COVID-19 in certain adults and children. Talk with your doctor to see if Veklury is appropriate for you. Like LAGEVRIO, FDA may also allow for the emergency use of other medicines to treat people with COVID-19. Go to https://www.fda.gov/emergency-preparedness-and-response/mcm-legalregulatory-and-policy-framework/emergency-use-authorization for more information. It is your choice to be treated or not to be treated with LAGEVRIO. Should you decide not to take it, it will not change your standard medical care.  What if I am breastfeeding? Breastfeeding is not recommended during treatment with LAGEVRIO and for 4 days after the last dose   of LAGEVRIO. If you are breastfeeding or plan to breastfeed, talk to your healthcare provider about your options and specific situation before taking LAGEVRIO.  How do I report side effects with LAGEVRIO? Contact your healthcare provider if you have any side effects that bother you or do not go away. Report side effects to FDA MedWatch at www.fda.gov/medwatch or call 1-800-FDA-1088 (1- 800-332-1088).  How should I store LAGEVRIO? ? Store LAGEVRIO capsules at room temperature between 68F to 77F (20C to 25C). ? Keep LAGEVRIO and all medicines out of the reach of children and pets. How can I learn more about COVID-19? ? Ask your healthcare provider. ? Visit www.cdc.gov/COVID19 ? Contact your local or state public health department. ? Call [employer] at 1-800-672-6372 (toll free in the [place] ? Visit www.molnupiravir.com  What Is an Emergency Use Authorization (EUA)? The [place] FDA has made  LAGEVRIO available under an emergency access mechanism called an Emergency Use Authorization (EUA) The EUA is supported by a Secretary of Health and Human Service (HHS) declaration that circumstances exist to justify emergency use of drugs and biological products during the COVID-19 pandemic. LAGEVRIO for the treatment of mild-to-moderate COVID-19 in adults with positive results of direct SARS-CoV-2 viral testing, who are at high risk for progression to severe COVID-19, including hospitalization or death, and for whom alternative COVID-19 treatment options approved or authorized by FDA are not accessible or clinically appropriate, has not undergone the same type of review as an FDA-approved product. In issuing an EUA under the COVID-19 public health emergency, the FDA has determined, among other things, that based on the total amount of scientific evidence available including data from adequate and well-controlled clinical trials, if available, it is reasonable to believe that the product may be effective for diagnosing, treating, or preventing COVID-19, or a serious or life-threatening disease or condition caused by COVID-19; that the known and potential benefits of the product, when used to diagnose, treat, or prevent such disease or condition, outweigh the known and potential risks of such product; and that there are no adequate, approved, and available alternatives.  All of these criteria must be met to allow for the product to be used in the treatment of patients during the COVID-19 pandemic. The EUA for LAGEVRIO is in effect for the duration of the COVID-19 declaration justifying emergency use of LAGEVRIO, unless terminated or revoked (after which LAGEVRIO may no longer be used under the EUA). For patent information: www.msd.com/research/patent Copyright  2021-2022 [employer] [place], [place] and its affiliates. All rights reserved. [id number] Revised: March  2022  

## 2021-04-20 NOTE — Progress Notes (Signed)
Virtual Visit via Video Note  I connected with Kavan  on 04/20/21 at  5:00 PM EDT by a video enabled telemedicine application and verified that I am speaking with the correct person using two identifiers.  Location patient: home, [place] Location provider:work or home office Persons participating in the virtual visit: patient, provider, daughter  I discussed the limitations of evaluation and management by telemedicine and the availability of in person appointments. The patient expressed understanding and agreed to proceed.   HPI:  Acute telemedicine visit for Covid19: -Onset: 3 days ago; covid test positive last night -Symptoms include: nasal congestion, sore throat, low grade fever, cough -Denies: CP, SOB, NVD, inability to eat/drink/get out of bed -Pertinent past medical history: see below -Pertinent medication allergies:  Allergies  Allergen Reactions   Bee Venom Anaphylaxis   Codeine Nausea Only   Lisinopril Other (See Comments)    Per patient bp went to zero  -COVID-19 vaccine status: 2 doses and 2 boosters -eGFR 91  ROS: See pertinent positives and negatives per HPI.  Past Medical History:  Diagnosis Date   Bradycardia    a. prior to STEMI 02/2016 -> improved with stenting.   Brain aneurysm    a. s/p coiling 01/2016.   CAD (coronary artery disease)    a. STEMI 02/2016 while on rehab s/p DES to Cx, EF 60-65%.   Chronic atrial fibrillation (HCC)    HTN (hypertension)    Hypercholesteremia    Lower extremity edema    Severe aortic stenosis    a. s/p aortic valve replacement using a pericardial tissue valve   Subarachnoid bleed (HCC)    a. due to brain aneurysm 01/2016.    Past Surgical History:  Procedure Laterality Date   AORTIC VALVE REPLACEMENT  08/30/2003   Dr. Cyndia Bent   CARDIAC CATHETERIZATION N/A 02/16/2016   Procedure: Left Heart Cath and Coronary Angiography;  Surgeon: Peter M Martinique, MD;  Location: [place] [facility];  Service: Cardiovascular;  Laterality: N/A;    CARDIAC CATHETERIZATION N/A 02/16/2016   Procedure: Coronary Stent Intervention;  Surgeon: Peter M Martinique, MD;  Location: [place] [facility];  Service: Cardiovascular;  Laterality: N/A;   IR GENERIC HISTORICAL  02/01/2016   IR TRANSCATH/EMBOLIZ 02/01/2016 [facility]   IR GENERIC HISTORICAL  02/01/2016   IR ANGIOGRAM FOLLOW UP STUDY 02/01/2016 [facility]   IR GENERIC HISTORICAL  02/01/2016   IR ANGIOGRAM FOLLOW UP STUDY 02/01/2016 [facility]   IR GENERIC HISTORICAL  02/01/2016   IR ANGIO INTRA EXTRACRAN SEL INTERNAL CAROTID BILAT MOD SED 02/01/2016 [facility]   IR GENERIC HISTORICAL  02/01/2016   IR NEURO EACH ADD'L AFTER BASIC UNI LEFT (MS) 02/01/2016 [facility]   RADIOLOGY WITH ANESTHESIA N/A 02/01/2016   Procedure: RADIOLOGY WITH ANESTHESIA;  Surgeon: Consuella Lose, MD;  Location: [place];  Service: Radiology;  Laterality: N/A;     Current Outpatient Medications:    apixaban (ELIQUIS) 2.5 MG TABS tablet, Take 1 tablet (2.5 mg total) by mouth 2 (two) times daily., Disp: 180 tablet, Rfl: 3   benzonatate (TESSALON PERLES) 100 MG capsule, Take 1 capsule (100 mg total) by mouth 3 (three) times daily as needed., Disp: 20 capsule, Rfl: 0   Cholecalciferol (VITAMIN D3) 50 MCG (2000 UT) capsule, Take 1 capsule (2,000 Units total) by mouth daily., Disp: 100 capsule, Rfl: 3   losartan (COZAAR) 50 MG tablet, Take 1.5 tablets (75 mg total) by mouth daily., Disp: 135 tablet, Rfl: 1   molnupiravir EUA (  LAGEVRIO) 200 mg CAPS capsule, Take 4 capsules (800 mg total) by mouth 2 (two) times daily for 5 days., Disp: 40 capsule, Rfl: 0   Multiple Vitamin (MULTIVITAMIN) capsule, Take 1 capsule by mouth daily.  , Disp: , Rfl:   EXAM:  VITALS per patient if applicable:  GENERAL: alert, oriented, appears well and in no acute distress  HEENT: atraumatic, conjunttiva clear, no obvious abnormalities on inspection of external nose and ears  NECK: normal movements of the head and neck  LUNGS: on  inspection no signs of respiratory distress, breathing rate appears normal, no obvious gross SOB, gasping or wheezing  CV: no obvious cyanosis  MS: moves all visible extremities without noticeable abnormality  PSYCH/NEURO: pleasant and cooperative, no obvious depression or anxiety, speech and thought processing grossly intact  ASSESSMENT AND PLAN:  Discussed the following assessment and plan:  COVID-19   Discussed treatment options (infusions and oral options and risk of drug interactions), ideal treatment window, potential complications, isolation and precautions for COVID-19.  Discussed possibility of rebound with antivirals and the need to reisolate if it should occur for 5 days. Checked for/reviewed any labs done in the last 90 days with GFR listed in HPI if available. After lengthy discussion, the patient opted for treatment with molnupiravir due to being higher risk for complications of covid or severe disease and other factors. Discussed EUA status of this drug and the fact that there is preliminary limited knowledge of risks/interactions/side effects per EUA document vs possible benefits and precautions. This information was shared with patient during the visit and also was provided in patient instructions. Also, advised that patient discuss risks/interactions and use with pharmacist/treatment team as well. The patient did want a prescription for cough, Tessalon Rx sent.  Other symptomatic care measures summarized in patient instructions.  Advised to seek prompt in person care if worsening, new symptoms arise, or if is not improving with treatment. Discussed options for inperson care if PCP office not available. Did let this patient know that I only do telemedicine on Tuesdays and Thursdays for [place]. Advised to schedule follow up visit with PCP or [facility] if any further questions or concerns to avoid delays in care.   I discussed the assessment and treatment plan with the patient. The  patient was provided an opportunity to ask questions and all were answered. The patient agreed with the plan and demonstrated an understanding of the instructions.     Lucretia Kern, DO

## 2021-04-23 ENCOUNTER — Other Ambulatory Visit: Payer: Self-pay | Admitting: Internal Medicine

## 2021-05-07 NOTE — Progress Notes (Signed)
Patient ID: Jason Hawkins, male   DOB: 1928/04/09, 87 y.o.   MRN: [id number]    87 y.o. history of AVR and afib.  Had [facility] with coil embolization of artery on 02/01/16. Complicated by STEMI while in rehab. Coumadin changed to eliquis.  Cath with small 80% lesion in OM and occluded distal circumflex that was stented Last echo in July 2017 with EF 60-65% normal bioprosthetic valve with mean gradient 8 peak 17 mmHg   He still has a fatalistic attitude and does not really want to take any meds. When seen by NP 01/23/21 was only taking eliquis once/day BP elevated and started on Losartan 25 mg daily   Tested positive for COVID 04/17/21 RX with molnupiravir  He continues to wish he had died when his wife of 51 years past  Daughter in Careers information officer looks in on him daily  Says he cut back his beer intake Likes to feed the birds Physically still in great shape and loves to give a firm hand shake      ROS: Denies fever, malais, weight loss, blurry vision, decreased visual acuity, cough, sputum, SOB, hemoptysis, pleuritic pain, palpitaitons, heartburn, abdominal pain, melena, lower extremity edema, claudication, or rash.  All other systems reviewed and negative  General: Affect appropriate Crotchety elderly male  HEENT: normal Neck supple with no adenopathy JVP normal no bruits no thyromegaly Lungs clear with no wheezing and good diaphragmatic motion Heart:  X0/X8 click SEM  murmur, no rub, gallop or click PMI normal post sternotomy  Abdomen: benighn, BS positve, no tenderness, no AAA no bruit.  No HSM or HJR Distal pulses intact with no bruits Plus 2 bilateral  Edema with stasis  Neuro non-focal No muscular weakness   Current Outpatient Medications  Medication Sig Dispense Refill   apixaban (ELIQUIS) 2.5 MG TABS tablet Take 1 tablet (2.5 mg total) by mouth 2 (two) times daily. 180 tablet 3   benzonatate (TESSALON PERLES) 100 MG capsule Take 1 capsule (100 mg total) by mouth 3 (three) times  daily as needed. 20 capsule 0   Cholecalciferol (VITAMIN D3) 50 MCG (2000 UT) capsule Take 1 capsule (2,000 Units total) by mouth daily. 100 capsule 3   losartan (COZAAR) 50 MG tablet Take 1.5 tablets (75 mg total) by mouth daily. 135 tablet 1   Multiple Vitamin (MULTIVITAMIN) capsule Take 1 capsule by mouth daily.       No current facility-administered medications for this visit.    Allergies  Bee venom, Codeine, and Lisinopril  Electrocardiogram:    Afib rate 60 PVC 01/06/20   Assessment and Plan AVR: normal valve clicks SBE  No echo needed  Edema  Venous disease refuses to take diuretic. Discussed elevation and low sodium diet  PAF:  ECG with afib today rate fine on eliquis for stroke prophylaxis but only taking low dose daily He promised to take bid  HLD Previously on statin diet Rx only at this point/age  CAD:  Stable no angina  Stent to distal circumflex 02/16/16  SAH:  No residual neurologic deficits. Fortunate coiled aneurysm so hopefully risk of recurrence On blood thinners will be less  HTN:  started on losartan 01/23/21 improved  COVID:  Diagnosed 04/17/21 Rx with molnupiravir improved   No testing   F/U in 6 months    Jenkins Rouge

## 2021-05-10 ENCOUNTER — Encounter: Payer: Self-pay | Admitting: Cardiovascular Disease

## 2021-05-10 ENCOUNTER — Other Ambulatory Visit: Payer: Self-pay

## 2021-05-10 ENCOUNTER — Ambulatory Visit (INDEPENDENT_AMBULATORY_CARE_PROVIDER_SITE_OTHER): Payer: Medicare Other | Admitting: Cardiovascular Disease

## 2021-05-10 VITALS — BP 120/64 | HR 75 | Ht 69.0 in | Wt 175.0 lb

## 2021-05-10 DIAGNOSIS — Z952 Presence of prosthetic heart valve: Secondary | ICD-10-CM | POA: Diagnosis not present

## 2021-05-10 DIAGNOSIS — Z7901 Long term (current) use of anticoagulants: Secondary | ICD-10-CM | POA: Diagnosis not present

## 2021-05-10 DIAGNOSIS — I25118 Atherosclerotic heart disease of native coronary artery with other forms of angina pectoris: Secondary | ICD-10-CM

## 2021-05-10 DIAGNOSIS — I4819 Other persistent atrial fibrillation: Secondary | ICD-10-CM | POA: Diagnosis not present

## 2021-05-10 NOTE — Patient Instructions (Signed)
Medication Instructions:  *If you need a refill on your cardiac medications before your next appointment, please call your pharmacy*  Lab Work: If you have labs (blood work) drawn today and your tests are completely normal, you will receive your results only by: MyChart Message (if you have MyChart) OR A paper copy in the mail If you have any lab test that is abnormal or we need to change your treatment, we will call you to review the results.  Testing/Procedures: None ordered today.  Follow-Up: At [facility], you and your health needs are our priority.  As part of our continuing mission to provide you with exceptional heart care, we have created designated Provider Care Teams.  These Care Teams include your primary Cardiologist (physician) and Advanced Practice Providers (APPs -  Physician Assistants and Nurse Practitioners) who all work together to provide you with the care you need, when you need it.  We recommend signing up for the patient portal called "MyChart".  Sign up information is provided on this After Visit Summary.  MyChart is used to connect with patients for Virtual Visits (Telemedicine).  Patients are able to view lab/test results, encounter notes, upcoming appointments, etc.  Non-urgent messages can be sent to your provider as well.   To learn more about what you can do with MyChart, go to [email or web address].    Your next appointment:   6 month(s)  The format for your next appointment:   In Person  Provider:   You may see Peter Nishan, MD or one of the following Advanced Practice Providers on your designated Care Team:   Laura Ingold, NP  

## 2021-05-25 DIAGNOSIS — H6122 Impacted cerumen, left ear: Secondary | ICD-10-CM | POA: Diagnosis not present

## 2021-05-25 DIAGNOSIS — H9072 Mixed conductive and sensorineural hearing loss, unilateral, left ear, with unrestricted hearing on the contralateral side: Secondary | ICD-10-CM | POA: Diagnosis not present

## 2021-05-25 DIAGNOSIS — H838X3 Other specified diseases of inner ear, bilateral: Secondary | ICD-10-CM | POA: Diagnosis not present

## 2021-06-12 DIAGNOSIS — H900 Conductive hearing loss, bilateral: Secondary | ICD-10-CM | POA: Diagnosis not present

## 2021-06-12 DIAGNOSIS — H6983 Other specified disorders of Eustachian tube, bilateral: Secondary | ICD-10-CM | POA: Diagnosis not present

## 2021-06-12 DIAGNOSIS — H6123 Impacted cerumen, bilateral: Secondary | ICD-10-CM | POA: Diagnosis not present

## 2021-06-13 ENCOUNTER — Ambulatory Visit (INDEPENDENT_AMBULATORY_CARE_PROVIDER_SITE_OTHER): Payer: Medicare Other

## 2021-06-13 DIAGNOSIS — Z0000 Encounter for general adult medical examination without abnormal findings: Secondary | ICD-10-CM | POA: Diagnosis not present

## 2021-06-13 NOTE — Progress Notes (Addendum)
I connected with Barbette Reichmann today by telephone and verified that I am speaking with the correct person using two identifiers. Location patient: home Location provider: work Persons participating in the virtual visit: patient, [name] (per DPR) and provider.   I discussed the limitations, risks, security and privacy concerns of performing an evaluation and management service by telephone and the availability of in person appointments. I also discussed with the patient that there may be a patient responsible charge related to this service. The patient expressed understanding and verbally consented to this telephonic visit.    Interactive audio and video telecommunications were attempted between this provider and patient, however failed, due to patient having technical difficulties OR patient did not have access to video capability.  We continued and completed visit with audio only.  Some vital signs may be absent or patient reported.   Time Spent with patient on telephone encounter: 40 minutes  Subjective:   Jason Hawkins is a 87 y.o. male who presents for Medicare Annual/Subsequent preventive examination.  Review of Systems     Cardiac Risk Factors include: advanced age (>40men, >53 women);family history of premature cardiovascular disease;hypertension;male gender     Objective:    There were no vitals filed for this visit. There is no height or weight on file to calculate BMI.  Advanced Directives 06/13/2021 03/24/2020 03/08/2016 02/25/2016 02/20/2016 02/13/2016 02/01/2016  Does Patient Have a Medical Advance Directive? Yes Yes Yes Yes Yes Yes Yes  Type of Advance Directive Living will;Healthcare Power of [place];Living will [place];Living will Living will;Healthcare Power of Attorney Living will;Healthcare Power of Attorney Living will;Healthcare Power of [place];Living will  Does patient want to  make changes to medical advance directive? No - Patient declined No - Patient declined No - Patient declined - No - Patient declined - No - Patient declined  Copy of [place] in Chart? No - copy requested No - copy requested No - copy requested No - copy requested No - copy requested No - copy requested No - copy requested    Current Medications (verified) Outpatient Encounter Medications as of 06/13/2021  Medication Sig   apixaban (ELIQUIS) 2.5 MG TABS tablet Take 1 tablet (2.5 mg total) by mouth 2 (two) times daily.   Cholecalciferol (VITAMIN D3) 50 MCG (2000 UT) capsule Take 1 capsule (2,000 Units total) by mouth daily.   losartan (COZAAR) 50 MG tablet Take 1.5 tablets (75 mg total) by mouth daily.   benzonatate (TESSALON PERLES) 100 MG capsule Take 1 capsule (100 mg total) by mouth 3 (three) times daily as needed. (Patient not taking: No sig reported)   Multiple Vitamin (MULTIVITAMIN) capsule Take 1 capsule by mouth daily.   (Patient not taking: Reported on 06/13/2021)   No facility-administered encounter medications on file as of 06/13/2021.    Allergies (verified) Bee venom, Codeine, and Lisinopril   History: Past Medical History:  Diagnosis Date   Bradycardia    a. prior to STEMI 02/2016 -> improved with stenting.   Brain aneurysm    a. s/p coiling 01/2016.   CAD (coronary artery disease)    a. STEMI 02/2016 while on rehab s/p DES to Cx, EF 60-65%.   Chronic atrial fibrillation (HCC)    HTN (hypertension)    Hypercholesteremia    Lower extremity edema    Severe aortic stenosis    a. s/p aortic valve replacement using a pericardial tissue valve   Subarachnoid  bleed ([place])    a. due to brain aneurysm 01/2016.   Past Surgical History:  Procedure Laterality Date   AORTIC VALVE REPLACEMENT  08/30/2003   Dr. Cyndia Bent   CARDIAC CATHETERIZATION N/A 02/16/2016   Procedure: Left Heart Cath and Coronary Angiography;  Surgeon: Peter M Martinique, MD;  Location: [facility];  Service: Cardiovascular;  Laterality: N/A;   CARDIAC CATHETERIZATION N/A 02/16/2016   Procedure: Coronary Stent Intervention;  Surgeon: Peter M Martinique, MD;  Location: [facility];  Service: Cardiovascular;  Laterality: N/A;   IR GENERIC HISTORICAL  02/01/2016   IR TRANSCATH/EMBOLIZ 02/01/2016 [facility]   IR GENERIC HISTORICAL  02/01/2016   IR ANGIOGRAM FOLLOW UP STUDY 02/01/2016 [facility]   IR GENERIC HISTORICAL  02/01/2016   IR ANGIOGRAM FOLLOW UP STUDY 02/01/2016 [facility]   IR GENERIC HISTORICAL  02/01/2016   IR ANGIO INTRA EXTRACRAN SEL INTERNAL CAROTID BILAT MOD SED 02/01/2016 [facility]   IR GENERIC HISTORICAL  02/01/2016   IR NEURO EACH ADD'L AFTER BASIC UNI LEFT (MS) 02/01/2016 [facility]   RADIOLOGY WITH ANESTHESIA N/A 02/01/2016   Procedure: RADIOLOGY WITH ANESTHESIA;  Surgeon: Consuella Lose, MD;  Location: [facility];  Service: Radiology;  Laterality: N/A;   Family History  Problem Relation Age of Onset   Diabetes Mother    Heart disease Father    Heart attack Father    Heart attack Unknown    Diabetes Unknown    Cancer Son 43       prostate ca   Social History   Socioeconomic History   Marital status: Widowed    Spouse name: Not on file   Number of children: Not on file   Years of education: Not on file   Highest education level: Not on file  Occupational History   Not on file  Tobacco Use   Smoking status: Former   Smokeless tobacco: Never  Substance and Sexual Activity   Alcohol use: Yes    Alcohol/week: 14.0 standard drinks    Types: 14 Cans of beer per week   Drug use: No   Sexual activity: Never  Other Topics Concern   Not on file  Social History Narrative   Not on file   Social Determinants of Health   Financial Resource Strain: Low Risk    Difficulty of Paying Living Expenses: Not hard at all  Food Insecurity: No Food Insecurity   Worried About Charity fundraiser in the Last Year: Never true   [place] in the Last  Year: Never true  Transportation Needs: No Transportation Needs   Lack of Transportation (Medical): No   Lack of Transportation (Non-Medical): No  Physical Activity: Inactive   Days of Exercise per Week: 0 days   Minutes of Exercise per Session: 0 min  Stress: No Stress Concern Present   Feeling of Stress : Not at all  Social Connections: Unknown   Frequency of Communication with Friends and Family: More than three times a week   Frequency of Social Gatherings with Friends and Family: More than three times a week   Attends Religious Services: Patient refused   Active Member of Clubs or Organizations: No   Attends Archivist Meetings: Patient refused   Marital Status: Widowed    Tobacco Counseling Counseling given: Not Answered   Clinical Intake:  Pre-visit preparation completed: Yes  Pain : No/denies pain     Nutritional Risks: Other (Comment) (low appetite) Diabetes:  No  How often do you need to have someone help you when you read instructions, pamphlets, or other written materials from your doctor or pharmacy?: 1 - Never What is the last grade level you completed in school?: 11th grade  Diabetic? no  Interpreter Needed?: No  Information entered by :: Lisette Abu, LPN   Activities of Daily Living In your present state of health, do you have any difficulty performing the following activities: 06/13/2021  Hearing? Y  Vision? N  Difficulty concentrating or making decisions? Y  Walking or climbing stairs? N  Dressing or bathing? N  Doing errands, shopping? Y  Preparing Food and eating ? N  Using the Toilet? N  In the past six months, have you accidently leaked urine? Y  Do you have problems with loss of bowel control? N  Managing your Medications? Y  Managing your Finances? Y  Housekeeping or managing your Housekeeping? Y  Some recent data might be hidden    Patient Care Team: Plotnikov, Evie Lacks, MD as PCP - General (Internal  Medicine) Josue Hector, MD as PCP - Cardiology (Cardiology) Josue Hector, MD as Consulting Physician (Cardiology) Laurence Aly, OD as Consulting Physician (Optometry)  Indicate any recent Medical Services you may have received from other than [facility] providers in the past year (date may be approximate).     Assessment:   This is a routine wellness examination for Jason Hawkins.  Hearing/Vision screen Hearing Screening - Comments:: Patient has difficulty hearing and wears hearing aids. Vision Screening - Comments:: Patient wears corrective lenses.  Eye exams done by [facility]  Dietary issues and exercise activities discussed: Current Exercise Habits: The patient does not participate in regular exercise at present, Exercise limited by: neurologic condition(s);cardiac condition(s);orthopedic condition(s)   Goals Addressed   None   Depression Screen PHQ 2/9 Scores 06/13/2021 04/06/2021 03/24/2020 03/15/2016 01/04/2015  PHQ - 2 Score 0 0 0 0 0  PHQ- 9 Score - 2 - - -    Fall Risk Fall Risk  06/13/2021 04/06/2021 03/24/2020 03/26/2019 03/07/2018  Falls in the past year? 1 1 0 0 No  Comment - - - - Emmi Telephone Survey: data to providers prior to load  Number falls in past yr: 0 0 0 0 -  Injury with Fall? 0 1 0 0 -  Risk for fall due to : Impaired balance/gait History of fall(s);Impaired balance/gait History of fall(s);Impaired balance/gait - -  Risk for fall due to: Comment - - - - -  Follow up - - Falls evaluation completed - -    FALL RISK PREVENTION PERTAINING TO THE HOME:  Any stairs in or around the home? No  If so, are there any without handrails? No  Home free of loose throw rugs in walkways, pet beds, electrical cords, etc? Yes  Adequate lighting in your home to reduce risk of falls? Yes   ASSISTIVE DEVICES UTILIZED TO PREVENT FALLS:  Life alert? No  Use of a cane, walker or w/c? Yes  Grab bars in the bathroom? Yes  Shower chair or bench in shower? Yes  Elevated toilet seat or a  handicapped toilet? Yes   TIMED UP AND GO:  Was the test performed? No .  Length of time to ambulate 10 feet: n/a sec.   Gait slow and steady with assistive device  Cognitive Function: Patient has current diagnosis of cognitive impairment.         Immunizations Immunization History  Administered Date(s) Administered  Influenza-Unspecified 05/06/2014   Moderna Sars-Covid-2 Vaccination 10/22/2019, 11/19/2019, 06/07/2020   PFIZER Comirnaty(Gray Top)Covid-19 Tri-Sucrose Vaccine 12/16/2020   PNEUMOCOCCAL CONJUGATE-20 04/06/2021   Pfizer Covid-19 Vaccine Bivalent Booster 21yrs & up 06/05/2021   Pneumococcal Polysaccharide-23 03/17/2013   Td 03/17/2008   Tdap 03/26/2019   Zoster, Live 05/12/2013    TDAP status: Up to date  Flu Vaccine status: Due, Education has been provided regarding the importance of this vaccine. Advised may receive this vaccine at local pharmacy or Health Dept. Aware to provide a copy of the vaccination record if obtained from local pharmacy or Health Dept. Verbalized acceptance and understanding.  Pneumococcal vaccine status: Up to date  Covid-19 vaccine status: Completed vaccines  Qualifies for Shingles Vaccine? Yes   Zostavax completed Yes   Shingrix Completed?: No.    Education has been provided regarding the importance of this vaccine. Patient has been advised to call insurance company to determine out of pocket expense if they have not yet received this vaccine. Advised may also receive vaccine at local pharmacy or Health Dept. Verbalized acceptance and understanding.  Screening Tests Health Maintenance  Topic Date Due   Zoster Vaccines- Shingrix (1 of 2) Never done   INFLUENZA VACCINE  03/06/2021   TETANUS/TDAP  03/25/2029   Pneumonia Vaccine 33+ Years old  Completed   COVID-19 Vaccine  Completed   HPV VACCINES  Aged Out    Health Maintenance  Health Maintenance Due  Topic Date Due   Zoster Vaccines- Shingrix (1 of 2) Never done    INFLUENZA VACCINE  03/06/2021    Colorectal cancer screening: No longer required.   Lung Cancer Screening: (Low Dose CT Chest recommended if Age 55-80 years, 30 pack-year currently smoking OR have quit w/in 15years.) does not qualify.   Lung Cancer Screening Referral: no  Additional Screening:  Hepatitis C Screening: does not qualify; Completed no  Vision Screening: Recommended annual ophthalmology exams for early detection of glaucoma and other disorders of the eye. Is the patient up to date with their annual eye exam?  Yes  Who is the provider or what is the name of the office in which the patient attends annual eye exams? [facility] If pt is not established with a provider, would they like to be referred to a provider to establish care? No .   Dental Screening: Recommended annual dental exams for proper oral hygiene  Community Resource Referral / Chronic Care Management: CRR required this visit?  No   CCM required this visit?  No      Plan:     I have personally reviewed and noted the following in the patient's chart:   Medical and social history Use of alcohol, tobacco or illicit drugs  Current medications and supplements including opioid prescriptions. Patient is not currently taking opioid prescriptions. Functional ability and status Nutritional status Physical activity Advanced directives List of other physicians Hospitalizations, surgeries, and ER visits in previous 12 months Vitals Screenings to include cognitive, depression, and falls Referrals and appointments  In addition, I have reviewed and discussed with patient certain preventive protocols, quality metrics, and best practice recommendations. A written personalized care plan for preventive services as well as general preventive health recommendations were provided to patient.     Sheral Flow, LPN   14/11/3152   Nurse Notes:  Patient is cogitatively intact. There were no vitals filed for this  visit. There is no height or weight on file to calculate BMI. Hearing Screening - Comments:: Patient  has difficulty hearing and wears hearing aids. Vision Screening - Comments:: Patient wears corrective lenses.  [facility] exams done by [facility].  Medical screening examination/treatment/procedure(s) were performed by non-physician practitioner and as supervising physician I was immediately available for consultation/collaboration.  I agree with above. Lew Dawes, MD

## 2021-07-04 ENCOUNTER — Telehealth (INDEPENDENT_AMBULATORY_CARE_PROVIDER_SITE_OTHER): Payer: Medicare Other | Admitting: Family Medicine

## 2021-07-04 DIAGNOSIS — R0981 Nasal congestion: Secondary | ICD-10-CM

## 2021-07-04 DIAGNOSIS — R519 Headache, unspecified: Secondary | ICD-10-CM

## 2021-07-04 MED ORDER — AMOXICILLIN-POT CLAVULANATE 875-125 MG PO TABS: 1.0000 | ORAL_TABLET | Freq: Two times a day (BID) | ORAL | 0 refills | Status: DC

## 2021-07-04 NOTE — Progress Notes (Signed)
Virtual Visit via Video Note  I connected with Twain  on 07/04/21 at 12:00 PM EST by a video enabled telemedicine application and verified that I am speaking with the correct person using two identifiers.  Location patient: home, [place] Location provider:work or home office Persons participating in the virtual visit: patient, provider, caregiver  I discussed the limitations of evaluation and management by telemedicine and the availability of in person appointments. The patient expressed understanding and agreed to proceed.   HPI:  Acute telemedicine visit for sinus issues: -Onset: some chronic issues but feels has had a sinus infection the last week or two -Symptoms include:sinus congestion, facial pain, upper tooth pain, ears full  -Denies:fevers, CP, SOB -Has tried:musinex and zyrtec -Pertinent past medical history: has history of ear issues and sinus issues - sees ENT -Pertinent medication allergies:  Allergies  Allergen Reactions   Bee Venom Anaphylaxis   Codeine Nausea Only   Lisinopril Other (See Comments)    Per patient bp went to zero  -COVID-19 vaccine status:  Immunization History  Administered Date(s) Administered   Influenza-Unspecified 05/06/2014   Moderna Sars-Covid-2 Vaccination 10/22/2019, 11/19/2019, 06/07/2020   PFIZER Comirnaty(Gray Top)Covid-19 Tri-Sucrose Vaccine 12/16/2020   PNEUMOCOCCAL CONJUGATE-20 04/06/2021   Pfizer Covid-19 Vaccine Bivalent Booster 45yrs & up 06/05/2021   Pneumococcal Polysaccharide-23 03/17/2013   Td 03/17/2008   Tdap 03/26/2019   Zoster, Live 05/12/2013    ROS: See pertinent positives and negatives per HPI.  Past Medical History:  Diagnosis Date   Bradycardia    a. prior to STEMI 02/2016 -> improved with stenting.   Brain aneurysm    a. s/p coiling 01/2016.   CAD (coronary artery disease)    a. STEMI 02/2016 while on rehab s/p DES to Cx, EF 60-65%.   Chronic atrial fibrillation (HCC)    HTN (hypertension)    Hypercholesteremia     Lower extremity edema    Severe aortic stenosis    a. s/p aortic valve replacement using a pericardial tissue valve   Subarachnoid bleed (HCC)    a. due to brain aneurysm 01/2016.    Past Surgical History:  Procedure Laterality Date   AORTIC VALVE REPLACEMENT  08/30/2003   Dr. Cyndia Bent   CARDIAC CATHETERIZATION N/A 02/16/2016   Procedure: Left Heart Cath and Coronary Angiography;  Surgeon: Peter M Martinique, MD;  Location: [facility];  Service: Cardiovascular;  Laterality: N/A;   CARDIAC CATHETERIZATION N/A 02/16/2016   Procedure: Coronary Stent Intervention;  Surgeon: Peter M Martinique, MD;  Location: [facility];  Service: Cardiovascular;  Laterality: N/A;   IR GENERIC HISTORICAL  02/01/2016   IR TRANSCATH/EMBOLIZ 02/01/2016 [facility]   IR GENERIC HISTORICAL  02/01/2016   IR ANGIOGRAM FOLLOW UP STUDY 02/01/2016 [facility]   IR GENERIC HISTORICAL  02/01/2016   IR ANGIOGRAM FOLLOW UP STUDY 02/01/2016 [facility]   IR GENERIC HISTORICAL  02/01/2016   IR ANGIO INTRA EXTRACRAN SEL INTERNAL CAROTID BILAT MOD SED 02/01/2016 [facility]   IR GENERIC HISTORICAL  02/01/2016   IR NEURO EACH ADD'L AFTER BASIC UNI LEFT (MS) 02/01/2016 [facility]   RADIOLOGY WITH ANESTHESIA N/A 02/01/2016   Procedure: RADIOLOGY WITH ANESTHESIA;  Surgeon: Consuella Lose, MD;  Location: [facility];  Service: Radiology;  Laterality: N/A;     Current Outpatient Medications:    amoxicillin-clavulanate (AUGMENTIN) 875-125 MG tablet, Take 1 tablet by mouth 2 (two) times daily., Disp: 20 tablet, Rfl: 0   apixaban (ELIQUIS) 2.5 MG TABS tablet, Take 1  tablet (2.5 mg total) by mouth 2 (two) times daily., Disp: 180 tablet, Rfl: 3   benzonatate (TESSALON PERLES) 100 MG capsule, Take 1 capsule (100 mg total) by mouth 3 (three) times daily as needed. (Patient not taking: No sig reported), Disp: 20 capsule, Rfl: 0   Cholecalciferol (VITAMIN D3) 50 MCG (2000 UT) capsule, Take 1 capsule (2,000 Units total) by mouth  daily., Disp: 100 capsule, Rfl: 3   losartan (COZAAR) 50 MG tablet, Take 1.5 tablets (75 mg total) by mouth daily., Disp: 135 tablet, Rfl: 1   Multiple Vitamin (MULTIVITAMIN) capsule, Take 1 capsule by mouth daily.   (Patient not taking: Reported on 06/13/2021), Disp: , Rfl:   EXAM:  VITALS per patient if applicable:  GENERAL: alert, oriented, appears well and in no acute distress  HEENT: atraumatic, conjunttiva clear, no obvious abnormalities on inspection of external nose and ears  NECK: normal movements of the head and neck  LUNGS: on inspection no signs of respiratory distress, breathing rate appears normal, no obvious gross SOB, gasping or wheezing  CV: no obvious cyanosis  MS: moves all visible extremities without noticeable abnormality  PSYCH/NEURO: pleasant and cooperative, no obvious depression or anxiety, speech and thought processing grossly intact  ASSESSMENT AND PLAN:  Discussed the following assessment and plan:  Nasal sinus congestion  Facial discomfort  -we discussed possible serious and likely etiologies, options for evaluation and workup, limitations of telemedicine visit vs in person visit, treatment, treatment risks and precautions. Pt is agreeable to treatment via telemedicine at this moment. He wants to try empiric abx for possible sinusitis.  Advised to seek prompt in person care if worsening, new symptoms arise, or if is not improving with treatment. Discussed options for inperson care if PCP office not available. Did let this patient know that I only do telemedicine on Tuesdays and Thursdays for [place]. Advised to schedule follow up visit with PCP or [facility] if any further questions or concerns to avoid delays in care.   I discussed the assessment and treatment plan with the patient. The patient was provided an opportunity to ask questions and all were answered. The patient agreed with the plan and demonstrated an understanding of the instructions.     Lucretia Kern, DO

## 2021-07-04 NOTE — Patient Instructions (Signed)
-  I sent the medication(s) we discussed to your pharmacy: Meds ordered this encounter  Medications  . amoxicillin-clavulanate (AUGMENTIN) 875-125 MG tablet    Sig: Take 1 tablet by mouth 2 (two) times daily.    Dispense:  20 tablet    Refill:  0     I hope you are feeling better soon!  Seek in person care promptly if your symptoms worsen, new concerns arise or you are not improving with treatment.  It was nice to meet you today. I help [place] out with telemedicine visits on Tuesdays and Thursdays and am available for visits on those days. If you have any concerns or questions following this visit please schedule a follow up visit with your Primary Care doctor or seek care at a local urgent care clinic to avoid delays in care.   

## 2021-07-21 ENCOUNTER — Encounter: Payer: Self-pay | Admitting: Family Medicine

## 2021-08-03 ENCOUNTER — Ambulatory Visit: Payer: Medicare Other | Admitting: Internal Medicine

## 2021-08-21 ENCOUNTER — Ambulatory Visit (INDEPENDENT_AMBULATORY_CARE_PROVIDER_SITE_OTHER): Payer: Medicare Other | Admitting: Internal Medicine

## 2021-08-21 ENCOUNTER — Encounter: Payer: Self-pay | Admitting: Internal Medicine

## 2021-08-21 ENCOUNTER — Other Ambulatory Visit: Payer: Self-pay

## 2021-08-21 DIAGNOSIS — R21 Rash and other nonspecific skin eruption: Secondary | ICD-10-CM | POA: Diagnosis not present

## 2021-08-21 DIAGNOSIS — I10 Essential (primary) hypertension: Secondary | ICD-10-CM

## 2021-08-21 DIAGNOSIS — H6090 Unspecified otitis externa, unspecified ear: Secondary | ICD-10-CM | POA: Insufficient documentation

## 2021-08-21 DIAGNOSIS — H60502 Unspecified acute noninfective otitis externa, left ear: Secondary | ICD-10-CM

## 2021-08-21 MED ORDER — TRIAMCINOLONE ACETONIDE 0.1 % EX CREA: 1.0000 "application " | TOPICAL_CREAM | Freq: Two times a day (BID) | CUTANEOUS | 1 refills | Status: DC

## 2021-08-21 MED ORDER — NEOMYCIN-POLYMYXIN-HC 3.5-10000-1 OT SOLN: 3.0000 [drp] | Freq: Three times a day (TID) | OTIC | 3 refills | Status: AC

## 2021-08-21 NOTE — Assessment & Plan Note (Signed)
Dry skin on back

## 2021-08-21 NOTE — Progress Notes (Signed)
Subjective:  Patient ID: Jason Hawkins, male    DOB: 08-29-1927  Age: 88 y.o. MRN: [id number]  CC: Otitis Media (Daughter states its the left ear.. had a tele-visit week ago not any better)   HPI [employer]  presents for L ear ache  C/o rash on the back  Outpatient Medications Prior to Visit  Medication Sig Dispense Refill   apixaban (ELIQUIS) 2.5 MG TABS tablet Take 1 tablet (2.5 mg total) by mouth 2 (two) times daily. 180 tablet 3   Cholecalciferol (VITAMIN D3) 50 MCG (2000 UT) capsule Take 1 capsule (2,000 Units total) by mouth daily. 100 capsule 3   losartan (COZAAR) 50 MG tablet Take 1.5 tablets (75 mg total) by mouth daily. 135 tablet 1   Multiple Vitamin (MULTIVITAMIN) capsule Take 1 capsule by mouth daily.     amoxicillin-clavulanate (AUGMENTIN) 875-125 MG tablet Take 1 tablet by mouth 2 (two) times daily. (Patient not taking: Reported on 08/21/2021) 20 tablet 0   benzonatate (TESSALON PERLES) 100 MG capsule Take 1 capsule (100 mg total) by mouth 3 (three) times daily as needed. (Patient not taking: Reported on 05/10/2021) 20 capsule 0   No facility-administered medications prior to visit.    ROS: Review of Systems  Constitutional:  Positive for fatigue. Negative for appetite change and unexpected weight change.  HENT:  Negative for congestion, nosebleeds, sneezing, sore throat and trouble swallowing.   Eyes:  Negative for itching and visual disturbance.  Respiratory:  Negative for cough.   Cardiovascular:  Negative for chest pain, palpitations and leg swelling.  Gastrointestinal:  Negative for abdominal distention, blood in stool, diarrhea and nausea.  Genitourinary:  Negative for frequency and hematuria.  Musculoskeletal:  Negative for back pain, gait problem, joint swelling and neck pain.  Skin:  Positive for rash.  Neurological:  Negative for dizziness, tremors, speech difficulty and weakness.  Psychiatric/Behavioral:  Negative for agitation, dysphoric mood, sleep  disturbance and suicidal ideas. The patient is not nervous/anxious.    Objective:  BP 138/62 (BP Location: Left Arm)    Pulse (!) 48    Temp 97.9 F (36.6 C) (Oral)    SpO2 97%   BP Readings from Last 3 Encounters:  08/21/21 138/62  05/10/21 120/64  04/20/21 (!) 163/98    Wt Readings from Last 3 Encounters:  05/10/21 175 lb (79.4 kg)  04/06/21 181 lb 12.8 oz (82.5 kg)  01/23/21 179 lb 6.4 oz (81.4 kg)    Physical Exam Constitutional:      General: He is not in acute distress.    Appearance: He is well-developed.     Comments: NAD  Eyes:     Conjunctiva/sclera: Conjunctivae normal.     Pupils: Pupils are equal, round, and reactive to light.  Neck:     Thyroid: No thyromegaly.     Vascular: No JVD.  Cardiovascular:     Rate and Rhythm: Normal rate and regular rhythm.     Heart sounds: Normal heart sounds. No murmur heard.   No friction rub. No gallop.  Pulmonary:     Effort: Pulmonary effort is normal. No respiratory distress.     Breath sounds: Normal breath sounds. No wheezing or rales.  Chest:     Chest wall: No tenderness.  Abdominal:     General: Bowel sounds are normal. There is no distension.     Palpations: Abdomen is soft. There is no mass.     Tenderness: There is no abdominal tenderness. There is no guarding or  rebound.  Musculoskeletal:        General: No tenderness. Normal range of motion.     Cervical back: Normal range of motion.  Lymphadenopathy:     Cervical: No cervical adenopathy.  Skin:    General: Skin is warm and dry.     Findings: No rash.  Neurological:     Mental Status: He is alert and oriented to person, place, and time.     Cranial Nerves: No cranial nerve deficit.     Motor: No abnormal muscle tone.     Coordination: Coordination normal.     Gait: Gait normal.     Deep Tendon Reflexes: Reflexes are normal and symmetric.  Psychiatric:        Behavior: Behavior normal.        Thought Content: Thought content normal.        Judgment:  Judgment normal.    Lab Results  Component Value Date   WBC 8.1 03/28/2020   HGB 13.6 03/28/2020   HCT 41.3 03/28/2020   PLT 198 03/28/2020   GLUCOSE 88 03/17/2021   CHOL 208 (H) 03/26/2019   TRIG 118.0 03/26/2019   HDL 61.80 03/26/2019   LDLCALC 123 (H) 03/26/2019   ALT 9 03/28/2020   AST 16 03/28/2020   NA 133 (L) 03/17/2021   K 4.1 03/17/2021   CL 95 (L) 03/17/2021   CREATININE 1.07 03/17/2021   BUN 13 03/17/2021   CO2 23 03/17/2021   TSH 1.50 03/28/2020   PSA 0.2 03/28/2020   INR 1.28 02/03/2016   HGBA1C 5.6 02/16/2016    US Renal  Result Date: 01/28/2017 CLINICAL DATA:  Recent UTI EXAM: RENAL / URINARY TRACT ULTRASOUND COMPLETE COMPARISON:  None. FINDINGS: Right Kidney: Length: 10 cm. Echogenicity within normal limits. No mass or hydronephrosis visualized. Left Kidney: Length: 10.9 cm. Echogenicity within normal limits. No mass or hydronephrosis visualized. Bladder: Appears normal for degree of bladder distention. Prostate is prominent indenting upon the inferior bladder. IMPRESSION: No acute abnormality noted.  Prominent prostate is seen. Electronically Signed   By: Inez Catalina M.D.   On: 01/28/2017 14:18    Assessment & Plan:   Problem List Items Addressed This Visit     Benign essential HTN    Last blood pressure is 138/62      Otitis externa    L ear - new Rx - Cortisporin otic gtt      Rash in adult    Dry skin on back         Meds ordered this encounter  Medications   triamcinolone cream (KENALOG) 0.1 %    Sig: Apply 1 application topically 2 (two) times daily.    Dispense:  80 g    Refill:  1   neomycin-polymyxin-hydrocortisone (CORTISPORIN) OTIC solution    Sig: Place 3 drops into the left ear 3 (three) times daily.    Dispense:  10 mL    Refill:  3      Follow-up: Return for a follow-up visit.  Walker Kehr, MD

## 2021-08-21 NOTE — Assessment & Plan Note (Addendum)
L ear - new Rx - Cortisporin otic gtt

## 2021-09-11 ENCOUNTER — Encounter: Payer: Self-pay | Admitting: Internal Medicine

## 2021-09-11 NOTE — Assessment & Plan Note (Signed)
Last blood pressure is 138/62

## 2022-01-11 NOTE — Progress Notes (Signed)
Patient ID: Jason Hawkins, male   DOB: [birth year], 88 y.o.   MRN: [id number]    88 y.o. history of AVR and afib.  Had Jason Hawkins with coil embolization of artery on 02/01/16. Complicated by STEMI while in rehab. Coumadin changed to eliquis.  Cath with small 80% lesion in OM and occluded distal circumflex that was stented Last echo in July 2017 with EF 60-65% normal bioprosthetic valve with mean gradient 8 peak 17 mmHg   He still has a fatalistic attitude and does not really want to take any meds. When seen by NP 01/23/21 was only taking eliquis once/day BP elevated and started on Losartan 25 mg daily   Tested positive for COVID 04/17/21 RX with molnupiravir  He continues to wish he had died when his wife of 65 years past  Daughter in Sports coach Jason Hawkins looks in on him daily  Says he cut back his beer intake Likes to feed the birds Physically still in great shape and loves to give a firm hand shake   86/16/23 saw primary for Otitis Media Rx with Kenalog and cortisporin drops  Has a dental infection on right side of jaw Seeing oral surgeon Thursday and apparantly does not need to hold eliquis He is taking Amoxacillin    ROS: Denies fever, malais, weight loss, blurry vision, decreased visual acuity, cough, sputum, SOB, hemoptysis, pleuritic pain, palpitaitons, heartburn, abdominal pain, melena, lower extremity edema, claudication, or rash.  All other systems reviewed and negative  General: Affect appropriate Crotchety elderly male  HEENT: normal Neck supple with no adenopathy JVP normal no bruits no thyromegaly Lungs clear with no wheezing and good diaphragmatic motion Heart:  A1/P3 click SEM  murmur, no rub, gallop or click PMI normal post sternotomy  Abdomen: benighn, BS positve, no tenderness, no AAA no bruit.  No HSM or HJR Distal pulses intact with no bruits Plus 2 bilateral  Edema with stasis  Neuro non-focal No muscular weakness   Current Outpatient Medications  Medication Sig Dispense Refill    amoxicillin (AMOXIL) 500 MG capsule Take 500 mg by mouth daily.     apixaban (ELIQUIS) 2.5 MG TABS tablet Take 1 tablet (2.5 mg total) by mouth 2 (two) times daily. 180 tablet 3   Cholecalciferol (VITAMIN D3) 50 MCG (2000 UT) capsule Take 1 capsule (2,000 Units total) by mouth daily. 100 capsule 3   Multiple Vitamin (MULTIVITAMIN) capsule Take 1 capsule by mouth daily.     triamcinolone cream (KENALOG) 0.1 % Apply 1 application topically 2 (two) times daily. 80 g 1   losartan (COZAAR) 50 MG tablet Take 1.5 tablets (75 mg total) by mouth daily. 135 tablet 1   No current facility-administered medications for this visit.    Allergies  Bee venom, Codeine, and Lisinopril  Electrocardiogram:    Afib rate 68 LAD no acute changes   Assessment and Plan AVR: normal valve clicks SBE  No echo needed  Edema  Venous disease refuses to take diuretic. Discussed elevation and low sodium diet  PAF:  ECG with afib today rate fine on eliquis for stroke prophylaxis but only taking low dose daily He promised to take bid  HLD Previously on statin diet Rx only at this point/age  CAD:  Stable no angina  Stent to distal circumflex 02/16/16  SAH:  No residual neurologic deficits. Fortunate coiled aneurysm so hopefully risk of recurrence On blood thinners will be less  HTN:  started on losartan 01/23/21 improved  COVID:  Diagnosed 04/17/21 Rx with  molnupiravir improved   No testing   F/U in 6 months    Jason Hawkins

## 2022-01-15 ENCOUNTER — Ambulatory Visit (INDEPENDENT_AMBULATORY_CARE_PROVIDER_SITE_OTHER): Payer: Medicare Other | Admitting: Cardiovascular Disease

## 2022-01-15 ENCOUNTER — Encounter: Payer: Self-pay | Admitting: Cardiovascular Disease

## 2022-01-15 VITALS — BP 112/68 | HR 68 | Ht 69.0 in | Wt 177.0 lb

## 2022-01-15 DIAGNOSIS — I25118 Atherosclerotic heart disease of native coronary artery with other forms of angina pectoris: Secondary | ICD-10-CM | POA: Diagnosis not present

## 2022-01-15 DIAGNOSIS — I4819 Other persistent atrial fibrillation: Secondary | ICD-10-CM

## 2022-01-15 DIAGNOSIS — I10 Essential (primary) hypertension: Secondary | ICD-10-CM | POA: Diagnosis not present

## 2022-01-15 DIAGNOSIS — Z952 Presence of prosthetic heart valve: Secondary | ICD-10-CM

## 2022-01-15 NOTE — Patient Instructions (Signed)

## 2022-02-21 ENCOUNTER — Other Ambulatory Visit: Payer: Self-pay

## 2022-02-21 DIAGNOSIS — I10 Essential (primary) hypertension: Secondary | ICD-10-CM

## 2022-02-21 MED ORDER — LOSARTAN POTASSIUM 50 MG PO TABS: 75.0000 mg | ORAL_TABLET | Freq: Every day | ORAL | 1 refills | Status: DC

## 2022-02-22 ENCOUNTER — Encounter: Payer: Self-pay | Admitting: Internal Medicine

## 2022-02-22 NOTE — Telephone Encounter (Addendum)
Need PA on eliquis.. Submitted w/   Key: [id number]. Rec'd msg " This request has been approved using information available on the patient's profile. [id number];  Approved; Review Type:Prior Auth; Effective  date:01/23/2022; Coverage End Date:02/22/2023; Johny Chess Sent patient msg already approved.

## 2022-02-23 NOTE — Telephone Encounter (Signed)
Noted../lmb 

## 2022-04-02 ENCOUNTER — Encounter: Payer: Self-pay | Admitting: Internal Medicine

## 2022-04-02 ENCOUNTER — Ambulatory Visit (INDEPENDENT_AMBULATORY_CARE_PROVIDER_SITE_OTHER): Payer: Medicare Other | Admitting: Internal Medicine

## 2022-04-02 DIAGNOSIS — I10 Essential (primary) hypertension: Secondary | ICD-10-CM

## 2022-04-02 DIAGNOSIS — C61 Malignant neoplasm of prostate: Secondary | ICD-10-CM

## 2022-04-02 DIAGNOSIS — I25118 Atherosclerotic heart disease of native coronary artery with other forms of angina pectoris: Secondary | ICD-10-CM

## 2022-04-02 DIAGNOSIS — Z8546 Personal history of malignant neoplasm of prostate: Secondary | ICD-10-CM

## 2022-04-02 DIAGNOSIS — I482 Chronic atrial fibrillation, unspecified: Secondary | ICD-10-CM | POA: Diagnosis not present

## 2022-04-02 DIAGNOSIS — R413 Other amnesia: Secondary | ICD-10-CM

## 2022-04-02 LAB — CBC WITH DIFFERENTIAL/PLATELET
Basophils Absolute: 0.1 10*3/uL (ref 0.0–0.1)
Basophils Relative: 0.6 % (ref 0.0–3.0)
Eosinophils Absolute: 0.2 10*3/uL (ref 0.0–0.7)
Eosinophils Relative: 2.9 % (ref 0.0–5.0)
HCT: 40.4 % (ref 39.0–52.0)
Hemoglobin: 13.5 g/dL (ref 13.0–17.0)
Lymphocytes Relative: 26.1 % (ref 12.0–46.0)
Lymphs Abs: 2.2 10*3/uL (ref 0.7–4.0)
MCHC: 33.4 g/dL (ref 30.0–36.0)
MCV: 91.5 fl (ref 78.0–100.0)
Monocytes Absolute: 0.9 10*3/uL (ref 0.1–1.0)
Monocytes Relative: 11.3 % (ref 3.0–12.0)
Neutro Abs: 4.9 10*3/uL (ref 1.4–7.7)
Neutrophils Relative %: 59.1 % (ref 43.0–77.0)
Platelets: 193 10*3/uL (ref 150.0–400.0)
RBC: 4.42 Mil/uL (ref 4.22–5.81)
RDW: 14.4 % (ref 11.5–15.5)
WBC: 8.4 10*3/uL (ref 4.0–10.5)

## 2022-04-02 LAB — LIPID PANEL
Cholesterol: 190 mg/dL (ref 0–200)
HDL: 53.3 mg/dL (ref >39.00)
LDL Cholesterol: 114 mg/dL — ABNORMAL HIGH (ref 0–99)
NonHDL: 136.52
Total CHOL/HDL Ratio: 4
Triglycerides: 113 mg/dL (ref 0.0–149.0)
VLDL: 22.6 mg/dL (ref 0.0–40.0)

## 2022-04-02 LAB — COMPREHENSIVE METABOLIC PANEL
ALT: 6 U/L (ref 0–53)
AST: 15 U/L (ref 0–37)
Albumin: 4.3 g/dL (ref 3.5–5.2)
Alkaline Phosphatase: 78 U/L (ref 39–117)
BUN: 16 mg/dL (ref 6–23)
CO2: 23 mEq/L (ref 19–32)
Calcium: 9.2 mg/dL (ref 8.4–10.5)
Chloride: 100 mEq/L (ref 96–112)
Creatinine, Ser: 1.13 mg/dL (ref 0.40–1.50)
GFR: 56.52 mL/min — ABNORMAL LOW (ref >60.00)
Glucose, Bld: 83 mg/dL (ref 70–99)
Potassium: 4.5 mEq/L (ref 3.5–5.1)
Sodium: 136 mEq/L (ref 135–145)
Total Bilirubin: 1.5 mg/dL — ABNORMAL HIGH (ref 0.2–1.2)
Total Protein: 7 g/dL (ref 6.0–8.3)

## 2022-04-02 LAB — URINALYSIS, ROUTINE W REFLEX MICROSCOPIC
Bilirubin Urine: NEGATIVE
Ketones, ur: NEGATIVE
Leukocytes,Ua: NEGATIVE
Nitrite: NEGATIVE
Specific Gravity, Urine: 1.025 (ref 1.000–1.030)
Total Protein, Urine: NEGATIVE
Urine Glucose: NEGATIVE
Urobilinogen, UA: 0.2 (ref 0.0–1.0)
pH: 5.5 (ref 5.0–8.0)

## 2022-04-02 LAB — TSH: TSH: 1.96 u[IU]/mL (ref 0.35–5.50)

## 2022-04-02 LAB — PSA: PSA: 0.25 ng/mL (ref 0.10–4.00)

## 2022-04-02 NOTE — Assessment & Plan Note (Signed)
2005 s/p XRT Pt wants [place] to heck PSA

## 2022-04-02 NOTE — Assessment & Plan Note (Addendum)
   Possible one TIA.  On Eliquis. Compliance was encouraged.

## 2022-04-02 NOTE — Progress Notes (Signed)
Subjective:  Patient ID: Jason Hawkins, male    DOB: July 31, 1928  Age: 88 y.o. MRN: [id number]  CC: Memory Loss   HPI Jason Hawkins presents for A fib, HTN, one time occasion issue w/processing. He is here w/his dtr in [employer] information [employer].  Outpatient Medications Prior to Visit  Medication Sig Dispense Refill   apixaban (ELIQUIS) 2.5 MG TABS tablet Take 1 tablet (2.5 mg total) by mouth 2 (two) times daily. 180 tablet 3   Cholecalciferol (VITAMIN D3) 50 MCG (2000 UT) capsule Take 1 capsule (2,000 Units total) by mouth daily. 100 capsule 3   losartan (COZAAR) 50 MG tablet Take 1.5 tablets (75 mg total) by mouth daily. 135 tablet 1   Multiple Vitamin (MULTIVITAMIN) capsule Take 1 capsule by mouth daily.     triamcinolone cream (KENALOG) 0.1 % Apply 1 application topically 2 (two) times daily. 80 g 1   amoxicillin (AMOXIL) 500 MG capsule Take 500 mg by mouth daily. (Patient not taking: Reported on 04/02/2022)     No facility-administered medications prior to visit.    ROS: Review of Systems  Constitutional:  Negative for activity change, appetite change, fatigue and unexpected weight change.  HENT:  Positive for hearing loss. Negative for congestion, nosebleeds, sneezing, sore throat and trouble swallowing.   Eyes:  Negative for itching and visual disturbance.  Respiratory:  Negative for cough.   Cardiovascular:  Negative for chest pain, palpitations and leg swelling.  Gastrointestinal:  Negative for abdominal distention, blood in stool, diarrhea and nausea.  Genitourinary:  Negative for frequency and hematuria.  Musculoskeletal:  Negative for back pain, gait problem, joint swelling and neck pain.  Skin:  Negative for rash.  Neurological:  Positive for dizziness. Negative for tremors, speech difficulty and weakness.  Psychiatric/Behavioral:  Positive for decreased concentration. Negative for agitation, dysphoric mood and sleep disturbance. The patient is not nervous/anxious.     Objective:   BP (!) 142/69 (BP Location: Left Arm)   Pulse (!) 43   Temp 97.7 F (36.5 C) (Oral)   Ht '5\' 9"'$  (1.753 m)   Wt 178 lb 9.6 oz (81 kg)   SpO2 95%   BMI 26.37 kg/m   BP Readings from Last 3 Encounters:  04/02/22 (!) 142/69  01/15/22 112/68  08/21/21 138/62    Wt Readings from Last 3 Encounters:  04/02/22 178 lb 9.6 oz (81 kg)  01/15/22 177 lb (80.3 kg)  05/10/21 175 lb (79.4 kg)    Physical Exam Constitutional:      General: He is not in acute distress.    Appearance: Normal appearance. He is well-developed.     Comments: NAD  Eyes:     Conjunctiva/sclera: Conjunctivae normal.     Pupils: Pupils are equal, round, and reactive to light.  Neck:     Thyroid: No thyromegaly.     Vascular: No JVD.  Cardiovascular:     Rate and Rhythm: Normal rate. Rhythm irregular.     Heart sounds: Normal heart sounds. No murmur heard.    No friction rub. No gallop.  Pulmonary:     Effort: Pulmonary effort is normal. No respiratory distress.     Breath sounds: Normal breath sounds. No wheezing or rales.  Chest:     Chest wall: No tenderness.  Abdominal:     General: Bowel sounds are normal. There is no distension.     Palpations: Abdomen is soft. There is no mass.     Tenderness: There is no abdominal tenderness. There  is no guarding or rebound.  Musculoskeletal:        General: No tenderness. Normal range of motion.     Cervical back: Normal range of motion.  Lymphadenopathy:     Cervical: No cervical adenopathy.  Skin:    General: Skin is warm and dry.     Findings: No rash.  Neurological:     Mental Status: He is alert and oriented to person, place, and time.     Cranial Nerves: No cranial nerve deficit.     Motor: No abnormal muscle tone.     Coordination: Coordination normal.     Gait: Gait abnormal.     Deep Tendon Reflexes: Reflexes are normal and symmetric.  Psychiatric:        Behavior: Behavior normal.        Thought Content: Thought content normal.        Judgment:  Judgment normal.   Using a cane Slight ataxia  Lab Results  Component Value Date   WBC 8.1 03/28/2020   HGB 13.6 03/28/2020   HCT 41.3 03/28/2020   PLT 198 03/28/2020   GLUCOSE 88 03/17/2021   CHOL 208 (H) 03/26/2019   TRIG 118.0 03/26/2019   HDL 61.80 03/26/2019   LDLCALC 123 (H) 03/26/2019   ALT 9 03/28/2020   AST 16 03/28/2020   NA 133 (L) 03/17/2021   K 4.1 03/17/2021   CL 95 (L) 03/17/2021   CREATININE 1.07 03/17/2021   BUN 13 03/17/2021   CO2 23 03/17/2021   TSH 1.50 03/28/2020   PSA 0.2 03/28/2020   INR 1.28 02/03/2016   HGBA1C 5.6 02/16/2016    US Renal  Result Date: 01/28/2017 CLINICAL DATA:  Recent UTI EXAM: RENAL / URINARY TRACT ULTRASOUND COMPLETE COMPARISON:  None. FINDINGS: Right Kidney: Length: 10 cm. Echogenicity within normal limits. No mass or hydronephrosis visualized. Left Kidney: Length: 10.9 cm. Echogenicity within normal limits. No mass or hydronephrosis visualized. Bladder: Appears normal for degree of bladder distention. Prostate is prominent indenting upon the inferior bladder. IMPRESSION: No acute abnormality noted.  Prominent prostate is seen. Electronically Signed   By: Inez Catalina M.D.   On: 01/28/2017 14:18    Assessment & Plan:   Problem List Items Addressed This Visit     Benign essential HTN    On Losartan      Relevant Orders   TSH   Urinalysis   CBC with Differential/Platelet   Lipid panel   Comprehensive metabolic panel   Chronic atrial fibrillation ([place])      Possible one TIA.  On Eliquis. Compliance was encouraged.      Relevant Orders   TSH   Urinalysis   CBC with Differential/Platelet   Lipid panel   Comprehensive metabolic panel   History of prostate cancer    2005 s/p XRT Pt wants [place] to heck PSA      Relevant Orders   PSA   Memory problem    Mild. Hearing loss is contributing. Possible one TIA.  On Eliquis. Compliance was encouraged.      Relevant Orders   TSH   Urinalysis   CBC with  Differential/Platelet   Lipid panel   Comprehensive metabolic panel   RESOLVED: Prostate cancer ([place])      No orders of the defined types were placed in this encounter.     Follow-up: Return in about 1 year (around 04/03/2023) for Wellness Exam.  Walker Kehr, MD

## 2022-04-02 NOTE — Assessment & Plan Note (Signed)
Mild. Hearing loss is contributing. Possible one TIA.  On Eliquis. Compliance was encouraged.

## 2022-04-02 NOTE — Assessment & Plan Note (Deleted)
PSA

## 2022-04-02 NOTE — Assessment & Plan Note (Signed)
On Losartan 

## 2022-05-10 ENCOUNTER — Encounter: Payer: Self-pay | Admitting: Internal Medicine

## 2022-05-10 MED ORDER — APIXABAN 2.5 MG PO TABS: 2.5000 mg | ORAL_TABLET | Freq: Two times a day (BID) | ORAL | 3 refills | Status: DC

## 2022-05-28 DIAGNOSIS — Z23 Encounter for immunization: Secondary | ICD-10-CM | POA: Diagnosis not present

## 2022-06-05 DIAGNOSIS — R21 Rash and other nonspecific skin eruption: Secondary | ICD-10-CM | POA: Diagnosis not present

## 2022-06-14 ENCOUNTER — Telehealth: Payer: Self-pay | Admitting: Internal Medicine

## 2022-06-14 NOTE — Telephone Encounter (Signed)
Left message for patient to call back to schedule Medicare Annual Wellness Visit   Last AWV  06/13/21  Please schedule at anytime with [facility] if patient calls the office back.    40 Minutes appointment   Any questions, please call me at [phone]

## 2022-06-25 ENCOUNTER — Encounter: Payer: Self-pay | Admitting: Internal Medicine

## 2022-06-25 ENCOUNTER — Ambulatory Visit (INDEPENDENT_AMBULATORY_CARE_PROVIDER_SITE_OTHER): Payer: Medicare Other

## 2022-06-25 ENCOUNTER — Ambulatory Visit: Payer: Medicare Other | Admitting: Internal Medicine

## 2022-06-25 ENCOUNTER — Ambulatory Visit (INDEPENDENT_AMBULATORY_CARE_PROVIDER_SITE_OTHER): Payer: Medicare Other | Admitting: Internal Medicine

## 2022-06-25 ENCOUNTER — Encounter: Payer: Self-pay | Admitting: *Deleted

## 2022-06-25 ENCOUNTER — Ambulatory Visit (INDEPENDENT_AMBULATORY_CARE_PROVIDER_SITE_OTHER): Payer: Medicare Other | Admitting: *Deleted

## 2022-06-25 VITALS — BP 136/78 | HR 55 | Temp 97.7°F

## 2022-06-25 VITALS — BP 136/78 | HR 55 | Temp 97.7°F | Ht 69.0 in | Wt 179.0 lb

## 2022-06-25 DIAGNOSIS — L299 Pruritus, unspecified: Secondary | ICD-10-CM | POA: Diagnosis not present

## 2022-06-25 DIAGNOSIS — I482 Chronic atrial fibrillation, unspecified: Secondary | ICD-10-CM | POA: Diagnosis not present

## 2022-06-25 DIAGNOSIS — Z0000 Encounter for general adult medical examination without abnormal findings: Secondary | ICD-10-CM

## 2022-06-25 DIAGNOSIS — I25118 Atherosclerotic heart disease of native coronary artery with other forms of angina pectoris: Secondary | ICD-10-CM | POA: Diagnosis not present

## 2022-06-25 DIAGNOSIS — R21 Rash and other nonspecific skin eruption: Secondary | ICD-10-CM | POA: Diagnosis not present

## 2022-06-25 DIAGNOSIS — J9811 Atelectasis: Secondary | ICD-10-CM | POA: Diagnosis not present

## 2022-06-25 LAB — COMPREHENSIVE METABOLIC PANEL WITH GFR
ALT: 8 U/L (ref 0–53)
AST: 13 U/L (ref 0–37)
Albumin: 4.4 g/dL (ref 3.5–5.2)
Alkaline Phosphatase: 89 U/L (ref 39–117)
BUN: 27 mg/dL — ABNORMAL HIGH (ref 6–23)
CO2: 27 meq/L (ref 19–32)
Calcium: 9.3 mg/dL (ref 8.4–10.5)
Chloride: 101 meq/L (ref 96–112)
Creatinine, Ser: 1.23 mg/dL (ref 0.40–1.50)
GFR: 50.97 mL/min — ABNORMAL LOW
Glucose, Bld: 82 mg/dL (ref 70–99)
Potassium: 3.9 meq/L (ref 3.5–5.1)
Sodium: 138 meq/L (ref 135–145)
Total Bilirubin: 1 mg/dL (ref 0.2–1.2)
Total Protein: 7.6 g/dL (ref 6.0–8.3)

## 2022-06-25 LAB — CBC WITH DIFFERENTIAL/PLATELET
Basophils Absolute: 0.1 10*3/uL (ref 0.0–0.1)
Basophils Relative: 0.6 % (ref 0.0–3.0)
Eosinophils Absolute: 0.7 10*3/uL (ref 0.0–0.7)
Eosinophils Relative: 7.3 % — ABNORMAL HIGH (ref 0.0–5.0)
HCT: 40.3 % (ref 39.0–52.0)
Hemoglobin: 13.5 g/dL (ref 13.0–17.0)
Lymphocytes Relative: 18.2 % (ref 12.0–46.0)
Lymphs Abs: 1.7 10*3/uL (ref 0.7–4.0)
MCHC: 33.5 g/dL (ref 30.0–36.0)
MCV: 91.7 fl (ref 78.0–100.0)
Monocytes Absolute: 1.1 10*3/uL — ABNORMAL HIGH (ref 0.1–1.0)
Monocytes Relative: 11.9 % (ref 3.0–12.0)
Neutro Abs: 5.8 10*3/uL (ref 1.4–7.7)
Neutrophils Relative %: 62 % (ref 43.0–77.0)
Platelets: 251 10*3/uL (ref 150.0–400.0)
RBC: 4.4 Mil/uL (ref 4.22–5.81)
RDW: 14.2 % (ref 11.5–15.5)
WBC: 9.4 10*3/uL (ref 4.0–10.5)

## 2022-06-25 MED ORDER — LORATADINE 10 MG PO TABS: 10.0000 mg | ORAL_TABLET | Freq: Every day | ORAL | 3 refills | Status: AC

## 2022-06-25 MED ORDER — TRIAMCINOLONE ACETONIDE 0.1 % EX CREA: 1.0000 | TOPICAL_CREAM | Freq: Four times a day (QID) | CUTANEOUS | 1 refills | Status: DC

## 2022-06-25 MED ORDER — METHYLPREDNISOLONE 4 MG PO TBPK: ORAL_TABLET | ORAL | 0 refills | Status: DC

## 2022-06-25 MED ORDER — METHYLPREDNISOLONE ACETATE 80 MG/ML IJ SUSP
80.0000 mg | Freq: Once | INTRAMUSCULAR | Status: AC
  Administered 2022-06-25: 80 mg via INTRAMUSCULAR

## 2022-06-25 MED ORDER — HYDROXYZINE PAMOATE 25 MG PO CAPS: 25.0000 mg | ORAL_CAPSULE | Freq: Three times a day (TID) | ORAL | 1 refills | Status: DC | PRN

## 2022-06-25 NOTE — Progress Notes (Signed)
Subjective:  Patient ID: Jason Hawkins, male    DOB: Dec 21, 1927  Age: 88 y.o. MRN: [id number]  CC: itching (All over body and bleeding from scrathing)      HPI Jason Hawkins presents for itching (all over body and bleeding from scrathing) x 6 weeks. F/u on HTN, A fib. He is here w/Lisa  Outpatient Medications Prior to Visit  Medication Sig Dispense Refill   apixaban (ELIQUIS) 2.5 MG TABS tablet Take 1 tablet (2.5 mg total) by mouth 2 (two) times daily. 180 tablet 3   Cholecalciferol (VITAMIN D3) 50 MCG (2000 UT) capsule Take 1 capsule (2,000 Units total) by mouth daily. (Patient not taking: Reported on 06/25/2022) 100 capsule 3   losartan (COZAAR) 50 MG tablet Take 1.5 tablets (75 mg total) by mouth daily. (Patient not taking: Reported on 06/25/2022) 135 tablet 1   Multiple Vitamin (MULTIVITAMIN) capsule Take 1 capsule by mouth daily. (Patient not taking: Reported on 06/25/2022)     triamcinolone cream (KENALOG) 0.1 % Apply 1 application topically 2 (two) times daily. (Patient not taking: Reported on 06/25/2022) 80 g 1   No facility-administered medications prior to visit.    ROS: Review of Systems  Constitutional:  Negative for appetite change, fatigue and unexpected weight change.  HENT:  Negative for congestion, nosebleeds, sneezing, sore throat and trouble swallowing.   Eyes:  Negative for itching and visual disturbance.  Respiratory:  Negative for cough.   Cardiovascular:  Negative for chest pain, palpitations and leg swelling.  Gastrointestinal:  Negative for abdominal distention, blood in stool, diarrhea and nausea.  Genitourinary:  Negative for frequency and hematuria.  Musculoskeletal:  Negative for back pain, gait problem, joint swelling and neck pain.  Skin:  Positive for color change and rash.  Neurological:  Negative for dizziness, tremors, speech difficulty and weakness.  Psychiatric/Behavioral:  Negative for agitation, dysphoric mood and sleep disturbance. The  patient is not nervous/anxious.     Objective:  BP 136/78 (BP Location: Right Arm, Patient Position: Sitting, Cuff Size: Normal)   Pulse (!) 55   Temp 97.7 F (36.5 C) (Oral)   Ht '5\' 9"'$  (1.753 m)   Wt 179 lb (81.2 kg)   SpO2 100%   BMI 26.43 kg/m   BP Readings from Last 3 Encounters:  06/25/22 136/78  04/02/22 (!) 142/69  01/15/22 112/68    Wt Readings from Last 3 Encounters:  06/25/22 179 lb (81.2 kg)  04/02/22 178 lb 9.6 oz (81 kg)  01/15/22 177 lb (80.3 kg)    Physical Exam Constitutional:      General: He is not in acute distress.    Appearance: He is well-developed.     Comments: NAD  Eyes:     Conjunctiva/sclera: Conjunctivae normal.     Pupils: Pupils are equal, round, and reactive to light.  Neck:     Thyroid: No thyromegaly.     Vascular: No JVD.  Cardiovascular:     Rate and Rhythm: Normal rate and regular rhythm.     Heart sounds: Normal heart sounds. No murmur heard.    No friction rub. No gallop.  Pulmonary:     Effort: Pulmonary effort is normal. No respiratory distress.     Breath sounds: Normal breath sounds. No wheezing or rales.  Chest:     Chest wall: No tenderness.  Abdominal:     General: Bowel sounds are normal. There is no distension.     Palpations: Abdomen is soft. There is no mass.  Tenderness: There is no abdominal tenderness. There is no guarding or rebound.  Musculoskeletal:        General: No tenderness. Normal range of motion.     Cervical back: Normal range of motion.  Lymphadenopathy:     Cervical: No cervical adenopathy.  Skin:    General: Skin is warm and dry.     Findings: No rash.  Neurological:     Mental Status: He is alert and oriented to person, place, and time.     Cranial Nerves: No cranial nerve deficit.     Motor: No abnormal muscle tone.     Coordination: Coordination normal.     Gait: Gait normal.     Deep Tendon Reflexes: Reflexes are normal and symmetric.  Psychiatric:        Behavior: Behavior  normal.        Thought Content: Thought content normal.        Judgment: Judgment normal.   Cane Papular rash on chest, shoulders, back, confluent  Lab Results  Component Value Date   WBC 8.4 04/02/2022   HGB 13.5 04/02/2022   HCT 40.4 04/02/2022   PLT 193.0 04/02/2022   GLUCOSE 83 04/02/2022   CHOL 190 04/02/2022   TRIG 113.0 04/02/2022   HDL 53.30 04/02/2022   LDLCALC 114 (H) 04/02/2022   ALT 6 04/02/2022   AST 15 04/02/2022   NA 136 04/02/2022   K 4.5 04/02/2022   CL 100 04/02/2022   CREATININE 1.13 04/02/2022   BUN 16 04/02/2022   CO2 23 04/02/2022   TSH 1.96 04/02/2022   PSA 0.25 04/02/2022   INR 1.28 02/03/2016   HGBA1C 5.6 02/16/2016    US Renal  Result Date: 01/28/2017 CLINICAL DATA:  Recent UTI EXAM: RENAL / URINARY TRACT ULTRASOUND COMPLETE COMPARISON:  None. FINDINGS: Right Kidney: Length: 10 cm. Echogenicity within normal limits. No mass or hydronephrosis visualized. Left Kidney: Length: 10.9 cm. Echogenicity within normal limits. No mass or hydronephrosis visualized. Bladder: Appears normal for degree of bladder distention. Prostate is prominent indenting upon the inferior bladder. IMPRESSION: No acute abnormality noted.  Prominent prostate is seen. Electronically Signed   By: Inez Catalina M.D.   On: 01/28/2017 14:18    Assessment & Plan:   Problem List Items Addressed This Visit     Chronic atrial fibrillation ([place])    On Eliquis      Relevant Orders   DG Chest 2 View   Rash in adult     New - ?etiology Depo-medrol Medrol pack CXR Labs ordered Hydroxyzine prn Loratidine po Triamcinolone tid RTC 2 wks - may need a skin bx Stop Losartan      Relevant Orders   DG Chest 2 View   Pruritic condition - Primary    New Stop Losartan      Relevant Orders   Comprehensive metabolic panel   CBC with Differential/Platelet      Meds ordered this encounter  Medications   hydrOXYzine (VISTARIL) 25 MG capsule    Sig: Take 1 capsule (25 mg total)  by mouth every 8 (eight) hours as needed for itching.    Dispense:  60 capsule    Refill:  1   loratadine (CLARITIN) 10 MG tablet    Sig: Take 1 tablet (10 mg total) by mouth daily.    Dispense:  30 tablet    Refill:  3   triamcinolone cream (KENALOG) 0.1 %    Sig: Apply 1 Application topically 4 (four) times  daily.    Dispense:  450 g    Refill:  1   methylPREDNISolone (MEDROL DOSEPAK) 4 MG TBPK tablet    Sig: As directed    Dispense:  21 tablet    Refill:  0      Follow-up: Return in about 2 weeks (around 07/09/2022) for a follow-up visit.  Walker Kehr, MD

## 2022-06-25 NOTE — Addendum Note (Signed)
Addended by: Basil Dess on: 06/25/2022 03:03 PM   Modules accepted: Orders

## 2022-06-25 NOTE — Patient Instructions (Signed)
Health Maintenance, Male Adopting a healthy lifestyle and getting preventive care are important in promoting health and wellness. Ask your health care provider about: The right schedule for you to have regular tests and exams. Things you can do on your own to prevent diseases and keep yourself healthy. What should I know about diet, weight, and exercise? Eat a healthy diet  Eat a diet that includes plenty of vegetables, fruits, low-fat dairy products, and lean protein. Do not eat a lot of foods that are high in solid fats, added sugars, or sodium. Maintain a healthy weight Body mass index (BMI) is a measurement that can be used to identify possible weight problems. It estimates body fat based on height and weight. Your health care provider can help determine your BMI and help you achieve or maintain a healthy weight. Get regular exercise Get regular exercise. This is one of the most important things you can do for your health. Most adults should: Exercise for at least 150 minutes each week. The exercise should increase your heart rate and make you sweat (moderate-intensity exercise). Do strengthening exercises at least twice a week. This is in addition to the moderate-intensity exercise. Spend less time sitting. Even light physical activity can be beneficial. Watch cholesterol and blood lipids Have your blood tested for lipids and cholesterol at 86 years of age, then have this test every 5 years. You may need to have your cholesterol levels checked more often if: Your lipid or cholesterol levels are high. You are older than 86 years of age. You are at high risk for heart disease. What should I know about cancer screening? Many types of cancers can be detected early and may often be prevented. Depending on your health history and family history, you may need to have cancer screening at various ages. This may include screening for: Colorectal cancer. Prostate cancer. Skin cancer. Lung  cancer. What should I know about heart disease, diabetes, and high blood pressure? Blood pressure and heart disease High blood pressure causes heart disease and increases the risk of stroke. This is more likely to develop in people who have high blood pressure readings or are overweight. Talk with your health care provider about your target blood pressure readings. Have your blood pressure checked: Every 3-5 years if you are 18-39 years of age. Every year if you are 40 years old or older. If you are between the ages of 65 and 75 and are a current or former smoker, ask your health care provider if you should have a one-time screening for abdominal aortic aneurysm (AAA). Diabetes Have regular diabetes screenings. This checks your fasting blood sugar level. Have the screening done: Once every three years after age 45 if you are at a normal weight and have a low risk for diabetes. More often and at a younger age if you are overweight or have a high risk for diabetes. What should I know about preventing infection? Hepatitis B If you have a higher risk for hepatitis B, you should be screened for this virus. Talk with your health care provider to find out if you are at risk for hepatitis B infection. Hepatitis C Blood testing is recommended for: Everyone born from 1945 through 1965. Anyone with known risk factors for hepatitis C. Sexually transmitted infections (STIs) You should be screened each year for STIs, including gonorrhea and chlamydia, if: You are sexually active and are younger than 86 years of age. You are older than 86 years of age and your   health care provider tells you that you are at risk for this type of infection. Your sexual activity has changed since you were last screened, and you are at increased risk for chlamydia or gonorrhea. Ask your health care provider if you are at risk. Ask your health care provider about whether you are at high risk for HIV. Your health care provider  may recommend a prescription medicine to help prevent HIV infection. If you choose to take medicine to prevent HIV, you should first get tested for HIV. You should then be tested every 3 months for as long as you are taking the medicine. Follow these instructions at home: Alcohol use Do not drink alcohol if your health care provider tells you not to drink. If you drink alcohol: Limit how much you have to 0-2 drinks a day. Know how much alcohol is in your drink. In the [place] one drink equals one 12 oz bottle of beer (355 mL), one 5 oz glass of wine (148 mL), or one 1 oz glass of hard liquor (44 mL). Lifestyle Do not use any products that contain nicotine or tobacco. These products include cigarettes, chewing tobacco, and vaping devices, such as e-cigarettes. If you need help quitting, ask your health care provider. Do not use street drugs. Do not share needles. Ask your health care provider for help if you need support or information about quitting drugs. General instructions Schedule regular health, dental, and eye exams. Stay current with your vaccines. Tell your health care provider if: You often feel depressed. You have ever been abused or do not feel safe at home. Summary Adopting a healthy lifestyle and getting preventive care are important in promoting health and wellness. Follow your health care provider's instructions about healthy diet, exercising, and getting tested or screened for diseases. Follow your health care provider's instructions on monitoring your cholesterol and blood pressure. This information is not intended to replace advice given to you by your health care provider. Make sure you discuss any questions you have with your health care provider. Document Revised: 12/12/2020 Document Reviewed: 12/12/2020 Elsevier Patient Education  2023 Elsevier Inc.  

## 2022-06-25 NOTE — Assessment & Plan Note (Signed)
On Eliquis

## 2022-06-25 NOTE — Progress Notes (Cosign Needed Addendum)
Subjective:   Jason Hawkins is a 88 y.o. male who presents for Medicare Annual/Subsequent preventive examination.  Review of Systems    Defer to PCP Cardiac Risk Factors include: sedentary lifestyle;smoking/ tobacco exposure;advanced age (>20mn, >>37women);male gender    Please see problem list for additional risk factors Objective:    Today's Vitals   06/25/22 1420  PainSc: 0-No pain   There is no height or weight on file to calculate BMI.     06/25/2022    2:20 PM 06/13/2021    1:48 PM 03/24/2020   12:55 PM 03/08/2016    3:10 PM 02/25/2016    8:00 AM 02/20/2016    6:44 PM 02/13/2016    5:10 PM  Advanced Directives  Does Patient Have a Medical Advance Directive? Yes Yes Yes Yes Yes Yes Yes  Type of Advance Directive Living will Living will;Healthcare Power of [place] Living will;Healthcare Power of Attorney Living will;Healthcare Power of Attorney Living will;Healthcare Power of Attorney  Does patient want to make changes to medical advance directive?  No - Patient declined No - Patient declined No - Patient declined  No - Patient declined   Copy of [place] Chart?  No - copy requested No - copy requested No - copy requested No - copy requested No - copy requested No - copy requested    Current Medications (verified) Outpatient Encounter Medications as of 06/25/2022  Medication Sig   apixaban (ELIQUIS) 2.5 MG TABS tablet Take 1 tablet (2.5 mg total) by mouth 2 (two) times daily.   Cholecalciferol (VITAMIN D3) 50 MCG (2000 UT) capsule Take 1 capsule (2,000 Units total) by mouth daily. (Patient not taking: Reported on 06/25/2022)   loratadine (CLARITIN) 10 MG tablet Take 1 tablet (10 mg total) by mouth daily.   losartan (COZAAR) 50 MG tablet Take 1.5 tablets (75 mg total) by mouth daily. (Patient not taking: Reported on 06/25/2022)   Multiple Vitamin (MULTIVITAMIN) capsule  Take 1 capsule by mouth daily. (Patient not taking: Reported on 06/25/2022)   triamcinolone cream (KENALOG) 0.1 % Apply 1 application topically 2 (two) times daily. (Patient not taking: Reported on 06/25/2022)   No facility-administered encounter medications on file as of 06/25/2022.    Allergies (verified) Bee venom, Codeine, and Lisinopril   History: Past Medical History:  Diagnosis Date   Bradycardia    a. prior to STEMI 02/2016 -> improved with stenting.   Brain aneurysm    a. s/p coiling 01/2016.   CAD (coronary artery disease)    a. STEMI 02/2016 while on rehab s/p DES to Cx, EF 60-65%.   Chronic atrial fibrillation (HCC)    HTN (hypertension)    Hypercholesteremia    Lower extremity edema    Severe aortic stenosis    a. s/p aortic valve replacement using a pericardial tissue valve   Subarachnoid bleed (HCC)    a. due to brain aneurysm 01/2016.   Past Surgical History:  Procedure Laterality Date   AORTIC VALVE REPLACEMENT  08/30/2003   Dr. BCyndia Bent  CARDIAC CATHETERIZATION N/A 02/16/2016   Procedure: Left Heart Cath and Coronary Angiography;  Surgeon: Peter M JMartinique MD;  [facility];  Service: Cardiovascular;  Laterality: N/A;   CARDIAC CATHETERIZATION N/A 02/16/2016   Procedure: Coronary Stent Intervention;  Surgeon: Peter M JMartinique MD;  [facility];  Service: Cardiovascular;  Laterality: N/A;   IR GENERIC HISTORICAL  02/01/2016   IR TRANSCATH/EMBOLIZ 02/01/2016 [facility]   IR GENERIC HISTORICAL  02/01/2016   IR ANGIOGRAM FOLLOW UP STUDY 02/01/2016 [facility]   IR GENERIC HISTORICAL  02/01/2016   IR ANGIOGRAM FOLLOW UP STUDY 02/01/2016 [facility]   IR GENERIC HISTORICAL  02/01/2016   IR ANGIO INTRA EXTRACRAN SEL INTERNAL CAROTID BILAT MOD SED 02/01/2016 [facility]   IR GENERIC HISTORICAL  02/01/2016   IR NEURO EACH ADD'L AFTER BASIC UNI LEFT (MS) 02/01/2016 [facility]   RADIOLOGY WITH ANESTHESIA N/A 02/01/2016   Procedure:  RADIOLOGY WITH ANESTHESIA;  Surgeon: Consuella Lose, MD;  Location: [place];  Service: Radiology;  Laterality: N/A;   Family History  Problem Relation Age of Onset   Diabetes Mother    Heart disease Father    Heart attack Father    Heart attack Unknown    Diabetes Unknown    Cancer Son 8       prostate ca   Social History   Socioeconomic History   Marital status: Widowed    Spouse name: Not on file   Number of children: Not on file   Years of education: Not on file   Highest education level: Not on file  Occupational History   Not on file  Tobacco Use   Smoking status: Former   Smokeless tobacco: Never  Substance and Sexual Activity   Alcohol use: Yes    Alcohol/week: 14.0 standard drinks of alcohol    Types: 14 Cans of beer per week   Drug use: No   Sexual activity: Never  Other Topics Concern   Not on file  Social History Narrative   Not on file   Social Determinants of Health   Financial Resource Strain: Low Risk  (06/25/2022)   Overall Financial Resource Strain (CARDIA)    Difficulty of Paying Living Expenses: Not hard at all  Food Insecurity: No Food Insecurity (06/25/2022)   Hunger Vital Sign    Worried About Running Out of Food in the Last Year: Never true    Ran Out of Food in the Last Year: Never true  Transportation Needs: No Transportation Needs (06/25/2022)   PRAPARE - Hydrologist (Medical): No    Lack of Transportation (Non-Medical): No  Physical Activity: Inactive (06/25/2022)   Exercise Vital Sign    Days of Exercise per Week: 0 days    Minutes of Exercise per Session: 0 min  Stress: No Stress Concern Present (06/25/2022)   [place]    Feeling of Stress : Not at all  Social Connections: Socially Isolated (06/25/2022)   Social Connection and Isolation Panel [NHANES]    Frequency of Communication with Friends and Family: More than three times a week     Frequency of Social Gatherings with Friends and Family: More than three times a week    Attends Religious Services: Never    Marine scientist or Organizations: No    Attends Archivist Meetings: Never    Marital Status: Widowed    Tobacco Counseling Counseling given: Not Answered   Clinical Intake:  Pre-visit preparation completed: Yes  Pain : No/denies pain Pain Score: 0-No pain     Nutritional Status: BMI 25 -29 Overweight Nutritional Risks: None Diabetes: No  How often do you need to have someone help you when you read instructions, pamphlets, or other written materials from your doctor or pharmacy?: 1 - Never What is  the last grade level you completed in school?: 11th grade  Diabetic?no  Interpreter Needed?: No      Activities of Daily Living    06/25/2022    2:32 PM 06/25/2022    2:30 PM  In your present state of health, do you have any difficulty performing the following activities:  Hearing?  1  Vision?  0  Difficulty concentrating or making decisions?  1  Walking or climbing stairs?  0  Dressing or bathing?  1  Doing errands, shopping?  0  Preparing Food and eating ? N   Using the Toilet? N   In the past six months, have you accidently leaked urine? Y   Do you have problems with loss of bowel control? N   Managing your Medications? Y   Managing your Finances? Y   Housekeeping or managing your Housekeeping? Y     Patient Care Team: Plotnikov, Evie Lacks, MD as PCP - General (Internal Medicine) Josue Hector, MD as PCP - Cardiology (Cardiology) Josue Hector, MD as Consulting Physician (Cardiology) Laurence Aly, OD as Consulting Physician (Optometry)  Indicate any recent Medical Services you may have received from other than [facility] providers in the past year (date may be approximate).     Assessment:   This is a routine wellness examination for [place].  Hearing/Vision screen Hearing Screening - Comments:: Patient wears  hearing aides Vision Screening - Comments:: atient wears corrective lenses. Eye exams done by [facility].  Dietary issues and exercise activities discussed: Current Exercise Habits: The patient does not participate in regular exercise at present, Exercise limited by: None identified   Goals Addressed   None   Depression Screen    06/25/2022    2:27 PM 06/25/2022    1:31 PM 06/13/2021    2:04 PM 04/06/2021    2:33 PM 03/24/2020   12:53 PM 03/15/2016   12:40 PM 01/04/2015   10:11 AM  PHQ 2/9 Scores  PHQ - 2 Score 0 0 0 0 0 0 0  PHQ- 9 Score  3  2       Fall Risk    06/25/2022    1:32 PM 06/13/2021    1:51 PM 04/06/2021    2:33 PM 03/24/2020   12:55 PM 03/26/2019   11:09 AM  Fall Risk   Falls in the past year? 0 1 1 0 0  Number falls in past yr: 0 0 0 0 0  Injury with Fall? 0 0 1 0 0  Risk for fall due to : No Fall Risks Impaired balance/gait History of fall(s);Impaired balance/gait History of fall(s);Impaired balance/gait   Follow up Falls evaluation completed   Falls evaluation completed     FALL RISK PREVENTION PERTAINING TO THE HOME:  Any stairs in or around the home? Yes  If so, are there any without handrails? Yes  Home free of loose throw rugs in walkways, pet beds, electrical cords, etc? Yes  Adequate lighting in your home to reduce risk of falls? Yes   ASSISTIVE DEVICES UTILIZED TO PREVENT FALLS:  Life alert? No  Use of a cane, walker or w/c? Yes  Grab bars in the bathroom? Yes  Shower chair or bench in shower? Yes  Elevated toilet seat or a handicapped toilet? Yes   TIMED UP AND GO:  Was the test performed? Yes .  Length of time to ambulate 10 feet: 6 sec.   Gait slow and steady with assistive device  Cognitive Function:  06/25/2022    2:34 PM  6CIT Screen  What Year? 0 points  What month? 0 points  What time? 3 points  Count back from 20 4 points    Immunizations Immunization History  Administered Date(s) Administered   Fluad Quad(high  Dose 65+) 05/28/2022   Influenza-Unspecified 05/06/2014   Moderna Sars-Covid-2 Vaccination 10/22/2019, 11/19/2019, 06/07/2020   PFIZER Comirnaty(Gray Top)Covid-19 Tri-Sucrose Vaccine 12/16/2020   PNEUMOCOCCAL CONJUGATE-20 04/06/2021   Pfizer Covid-19 Vaccine Bivalent Booster 36yr & up 06/05/2021   Pneumococcal Polysaccharide-23 03/17/2013   Td 03/17/2008   Tdap 03/26/2019   Zoster, Live 05/12/2013    TDAP status: Due, Education has been provided regarding the importance of this vaccine. Advised may receive this vaccine at local pharmacy or Health Dept. Aware to provide a copy of the vaccination record if obtained from local pharmacy or Health Dept. Verbalized acceptance and understanding.  Flu Vaccine status: Up to date  Pneumococcal vaccine status: Up to date  Covid-19 vaccine status: Completed vaccines  Qualifies for Shingles Vaccine? Yes   Zostavax completed Yes   Shingrix Completed?: Yes  Screening Tests Health Maintenance  Topic Date Due   Zoster Vaccines- Shingrix (1 of 2) Never done   COVID-19 Vaccine (6 - Moderna risk series) 07/31/2021   Medicare Annual Wellness (AWV)  06/26/2023   Pneumonia Vaccine 88 Years old  Completed   INFLUENZA VACCINE  Completed   HPV VACCINES  Aged Out    Health Maintenance  Health Maintenance Due  Topic Date Due   Zoster Vaccines- Shingrix (1 of 2) Never done   COVID-19 Vaccine (6 - Moderna risk series) 07/31/2021    Colorectal cancer screening: No longer required.   Lung Cancer Screening: (Low Dose CT Chest recommended if Age 86-80years, 30 pack-year currently smoking OR have quit w/in 15years.) does not qualify.   Lung Cancer Screening Referral: n/a  Additional Screening:  Hepatitis C Screening: does not qualify; Completed n/a  Vision Screening: Recommended annual ophthalmology exams for early detection of glaucoma and other disorders of the eye. Is the patient up to date with their annual eye exam?  Yes  Who is the  provider or what is the name of the office in which the patient attends annual eye exams? [facility]  If pt is not established with a provider, would they like to be referred to a provider to establish care? No .   Dental Screening: Recommended annual dental exams for proper oral hygiene  Community Resource Referral / Chronic Care Management: CRR required this visit?  No   CCM required this visit?  No      Plan:     I have personally reviewed and noted the following in the patient's chart:   Medical and social history Use of alcohol, tobacco or illicit drugs  Current medications and supplements including opioid prescriptions. Patient is not currently taking opioid prescriptions. Functional ability and status Nutritional status Physical activity Advanced directives List of other physicians Hospitalizations, surgeries, and ER visits in previous 12 months Vitals Screenings to include cognitive, depression, and falls Referrals and appointments  In addition, I have reviewed and discussed with patient certain preventive protocols, quality metrics, and best practice recommendations. A written personalized care plan for preventive services as well as general preventive health recommendations were provided to patient.     MMylinda Latina CKress  06/25/2022    Nurse Notes:  Mr. KHarkin, Thank you for taking time to come for your Medicare Wellness Visit. I appreciate  your ongoing commitment to your health goals. Please review the following plan we discussed and let me know if I can assist you in the future.   These are the goals we discussed:  Goals       Client understands the importance of follow-up with providers by attending scheduled visits (pt-stated)      In the process of finding a new eye doctor and dentist here in [place].        This is a list of the screening recommended for you and due dates:  Health Maintenance  Topic Date Due   Zoster (Shingles)  Vaccine (1 of 2) Never done   COVID-19 Vaccine (6 - Moderna risk series) 07/31/2021   Medicare Annual Wellness Visit  06/26/2023   Pneumonia Vaccine  Completed   Flu Shot  Completed   HPV Vaccine  Aged Out      Medical screening examination/treatment/procedure(s) were performed by non-physician practitioner and as supervising physician I was immediately available for consultation/collaboration.  I agree with above. Lew Dawes, MD

## 2022-06-25 NOTE — Assessment & Plan Note (Addendum)
New Stop Losartan

## 2022-06-25 NOTE — Assessment & Plan Note (Addendum)
New - ?etiology Depo-medrol Medrol pack CXR Labs ordered Hydroxyzine prn Loratidine po Triamcinolone tid RTC 2 wks - may need a skin bx Stop Losartan

## 2022-07-09 NOTE — Progress Notes (Signed)
Patient ID: Jason Hawkins, male   DOB: 1927/10/31, 88 y.o.   MRN: [id number]    88 y.o. history of AVR and afib.  Had [facility] with coil embolization of artery on 02/01/16. Complicated by STEMI while in rehab. Coumadin changed to eliquis.  Cath with small 80% lesion in OM and occluded distal circumflex that was stented Last echo in July 2017 with EF 60-65% normal bioprosthetic valve with mean gradient 8 peak 17 mmHg   He still has a fatalistic attitude and does not really want to take any meds. When seen by NP 01/23/85 was only taking eliquis once/day BP elevated and started on Losartan 25 mg daily   Tested positive for COVID 04/17/21 RX with molnupiravir  He continues to wish he had died when his wife of 17 years past  Daughter in Sports coach [name] looks in on him daily  Says he cut back his beer intake Likes to feed the birds Memory getting worse with poor hearing   Seen by primary 06/25/22 for diffuse rash Losartan d/c ? Why. Rx with depomedrol, Hydroxyzine , Loratidine and triamcinolone  CXR NAD WBC normal but had elevated relative eosinophil count 7.3%  Now improved Having PT come to house Drinking a bit less beer as it does not taste good    ROS: Denies fever, malais, weight loss, blurry vision, decreased visual acuity, cough, sputum, SOB, hemoptysis, pleuritic pain, palpitaitons, heartburn, abdominal pain, melena, lower extremity edema, claudication, or rash.  All other systems reviewed and negative  General: Affect appropriate Crotchety elderly male  HEENT: normal Neck supple with no adenopathy JVP normal no bruits no thyromegaly Lungs clear with no wheezing and good diaphragmatic motion Heart:  E8/B1 click SEM  murmur, no rub, gallop or click PMI normal post sternotomy  Abdomen: benighn, BS positve, no tenderness, no AAA no bruit.  No HSM or HJR Distal pulses intact with no bruits Plus 2 bilateral  Edema with stasis  Skin:  improved rash on chest    Current Outpatient Medications   Medication Sig Dispense Refill   apixaban (ELIQUIS) 2.5 MG TABS tablet Take 1 tablet (2.5 mg total) by mouth 2 (two) times daily. 180 tablet 3   Cholecalciferol (VITAMIN D3) 50 MCG (2000 UT) capsule Take 1 capsule (2,000 Units total) by mouth daily. 100 capsule 3   hydrOXYzine (VISTARIL) 25 MG capsule Take 1 capsule (25 mg total) by mouth every 8 (eight) hours as needed for itching. 60 capsule 1   loratadine (CLARITIN) 10 MG tablet Take 1 tablet (10 mg total) by mouth daily. 30 tablet 3   Multiple Vitamin (MULTIVITAMIN) capsule Take 1 capsule by mouth daily.     predniSONE (DELTASONE) 10 MG tablet Prednisone 10 mg: take 4 tabs a day x 3 days; then 3 tabs a day x 4 days; then 2 tabs a day x 4 days, then 1 tab a day x 6 days, then stop. Take pc. 38 tablet 1   triamcinolone cream (KENALOG) 0.1 % Apply 1 application topically 2 (two) times daily. 80 g 1   triamcinolone cream (KENALOG) 0.1 % Apply 1 Application topically 4 (four) times daily. (Patient not taking: Reported on 07/19/2022) 450 g 1   No current facility-administered medications for this visit.    Allergies  Bee venom, Codeine, and Lisinopril  Electrocardiogram:    Afib rate 68 LAD no acute changes   Assessment and Plan AVR: normal valve clicks SBE  No echo needed  Edema  Venous disease refuses to take  diuretic. Discussed elevation and low sodium diet  PAF:  ECG with afib today rate fine on eliquis for stroke prophylaxis but only taking low dose daily He promised to take bid  HLD Previously on statin diet Rx only at this point/age  CAD:  Stable no angina  Stent to distal circumflex 02/16/16  SAH:  No residual neurologic deficits. Fortunate coiled aneurysm so hopefully risk of recurrence On blood thinners will be less  HTN:  started on losartan 01/23/21 improved  COVID:  Diagnosed 04/17/21 Rx with molnupiravir improved  Rash:  diffuse upper body with pruritus and elevated relative eosinophil count ARB d/c Rx as above    No testing    F/U in 6 months    Jenkins Rouge

## 2022-07-11 ENCOUNTER — Ambulatory Visit (INDEPENDENT_AMBULATORY_CARE_PROVIDER_SITE_OTHER): Payer: Medicare Other | Admitting: Internal Medicine

## 2022-07-11 ENCOUNTER — Encounter: Payer: Self-pay | Admitting: Internal Medicine

## 2022-07-11 VITALS — BP 162/78 | HR 60 | Temp 97.6°F | Ht 69.0 in | Wt 178.0 lb

## 2022-07-11 DIAGNOSIS — R269 Unspecified abnormalities of gait and mobility: Secondary | ICD-10-CM | POA: Diagnosis not present

## 2022-07-11 DIAGNOSIS — I25118 Atherosclerotic heart disease of native coronary artery with other forms of angina pectoris: Secondary | ICD-10-CM | POA: Diagnosis not present

## 2022-07-11 DIAGNOSIS — R21 Rash and other nonspecific skin eruption: Secondary | ICD-10-CM

## 2022-07-11 MED ORDER — PREDNISONE 10 MG PO TABS: ORAL_TABLET | ORAL | 1 refills | Status: DC

## 2022-07-11 NOTE — Progress Notes (Signed)
Subjective:  Patient ID: Jason Hawkins, male    DOB: 07-29-1928  Age: 88 y.o. MRN: [id number]  CC: Follow-up (Still having some itching , may need some physical therapy)   HPI Jason Hawkins presents for rash - much better and HTN f/u. We stopped Losartan. Here w/Jason Hawkins C/o weakness, unsteady gait  Outpatient Medications Prior to Visit  Medication Sig Dispense Refill   apixaban (ELIQUIS) 2.5 MG TABS tablet Take 1 tablet (2.5 mg total) by mouth 2 (two) times daily. 180 tablet 3   Cholecalciferol (VITAMIN D3) 50 MCG (2000 UT) capsule Take 1 capsule (2,000 Units total) by mouth daily. 100 capsule 3   hydrOXYzine (VISTARIL) 25 MG capsule Take 1 capsule (25 mg total) by mouth every 8 (eight) hours as needed for itching. 60 capsule 1   loratadine (CLARITIN) 10 MG tablet Take 1 tablet (10 mg total) by mouth daily. 30 tablet 3   Multiple Vitamin (MULTIVITAMIN) capsule Take 1 capsule by mouth daily.     triamcinolone cream (KENALOG) 0.1 % Apply 1 application topically 2 (two) times daily. 80 g 1   triamcinolone cream (KENALOG) 0.1 % Apply 1 Application topically 4 (four) times daily. 450 g 1   methylPREDNISolone (MEDROL DOSEPAK) 4 MG TBPK tablet As directed 21 tablet 0   losartan (COZAAR) 50 MG tablet Take 1.5 tablets (75 mg total) by mouth daily. (Patient not taking: Reported on 06/25/2022) 135 tablet 1   No facility-administered medications prior to visit.    ROS: Review of Systems  Constitutional:  Positive for fatigue. Negative for appetite change and unexpected weight change.  HENT:  Negative for congestion, nosebleeds, sneezing, sore throat and trouble swallowing.   Eyes:  Negative for itching and visual disturbance.  Respiratory:  Negative for cough.   Cardiovascular:  Negative for chest pain, palpitations and leg swelling.  Gastrointestinal:  Negative for abdominal distention, blood in stool, diarrhea and nausea.  Genitourinary:  Negative for frequency and hematuria.   Musculoskeletal:  Positive for arthralgias and gait problem. Negative for back pain, joint swelling and neck pain.  Skin:  Positive for color change and rash.  Neurological:  Negative for dizziness, tremors, speech difficulty and weakness.  Psychiatric/Behavioral:  Negative for agitation, dysphoric mood, sleep disturbance and suicidal ideas. The patient is not nervous/anxious.    Using a cane Ataxic  Objective:  BP (!) 162/78 (BP Location: Right Arm, Patient Position: Sitting, Cuff Size: Normal)   Pulse 60   Temp 97.6 F (36.4 C) (Oral)   Ht '5\' 9"'$  (1.753 m)   Wt 178 lb (80.7 kg)   SpO2 99%   BMI 26.29 kg/m   BP Readings from Last 3 Encounters:  07/11/22 (!) 162/78  06/25/22 136/78  06/25/22 136/78    Wt Readings from Last 3 Encounters:  07/11/22 178 lb (80.7 kg)  06/25/22 179 lb (81.2 kg)  04/02/22 178 lb 9.6 oz (81 kg)    Physical Exam Constitutional:      General: He is not in acute distress.    Appearance: He is well-developed. He is obese.     Comments: NAD  Eyes:     Conjunctiva/sclera: Conjunctivae normal.     Pupils: Pupils are equal, round, and reactive to light.  Neck:     Thyroid: No thyromegaly.     Vascular: No JVD.  Cardiovascular:     Rate and Rhythm: Normal rate and regular rhythm.     Heart sounds: Normal heart sounds. No murmur heard.    No  friction rub. No gallop.  Pulmonary:     Effort: Pulmonary effort is normal. No respiratory distress.     Breath sounds: Normal breath sounds. No wheezing or rales.  Chest:     Chest wall: No tenderness.  Abdominal:     General: Bowel sounds are normal. There is no distension.     Palpations: Abdomen is soft. There is no mass.     Tenderness: There is no abdominal tenderness. There is no guarding or rebound.  Musculoskeletal:        General: No tenderness. Normal range of motion.     Cervical back: Normal range of motion.  Lymphadenopathy:     Cervical: No cervical adenopathy.  Skin:    General: Skin  is warm and dry.     Findings: Erythema, lesion and rash present.  Neurological:     Mental Status: He is alert and oriented to person, place, and time.     Cranial Nerves: No cranial nerve deficit.     Motor: Weakness present. No abnormal muscle tone.     Coordination: Coordination normal.     Gait: Gait abnormal.     Deep Tendon Reflexes: Reflexes are normal and symmetric.  Psychiatric:        Behavior: Behavior normal.        Thought Content: Thought content normal.        Judgment: Judgment normal.   Rash is better Using a cane Stiff, ataxic    Lab Results  Component Value Date   WBC 9.4 06/25/2022   HGB 13.5 06/25/2022   HCT 40.3 06/25/2022   PLT 251.0 06/25/2022   GLUCOSE 82 06/25/2022   CHOL 190 04/02/2022   TRIG 113.0 04/02/2022   HDL 53.30 04/02/2022   LDLCALC 114 (H) 04/02/2022   ALT 8 06/25/2022   AST 13 06/25/2022   NA 138 06/25/2022   K 3.9 06/25/2022   CL 101 06/25/2022   CREATININE 1.23 06/25/2022   BUN 27 (H) 06/25/2022   CO2 27 06/25/2022   TSH 1.96 04/02/2022   PSA 0.25 04/02/2022   INR 1.28 02/03/2016   HGBA1C 5.6 02/16/2016    US Renal  Result Date: 01/28/2017 CLINICAL DATA:  Recent UTI EXAM: RENAL / URINARY TRACT ULTRASOUND COMPLETE COMPARISON:  None. FINDINGS: Right Kidney: Length: 10 cm. Echogenicity within normal limits. No mass or hydronephrosis visualized. Left Kidney: Length: 10.9 cm. Echogenicity within normal limits. No mass or hydronephrosis visualized. Bladder: Appears normal for degree of bladder distention. Prostate is prominent indenting upon the inferior bladder. IMPRESSION: No acute abnormality noted.  Prominent prostate is seen. Electronically Signed   By: Inez Catalina M.D.   On: 01/28/2017 14:18    Assessment & Plan:   Problem List Items Addressed This Visit     Gait disorder    Start home PT       Relevant Orders   Ambulatory referral to [place]   Rash in adult - Primary    Prednisone 10 mg: take 4 tabs a day x 3  days; then 3 tabs a day x 4 days; then 2 tabs a day x 4 days, then 1 tab a day x 6 days, then stop. Take pc.  CXR was ok Labs ok Hydroxyzine prn Loratidine po Triamcinolone tid RTC 4 wks - may need a skin bx to r/o systemic illness if not free of rash Stopped Losartan         Meds ordered this encounter  Medications   predniSONE (  DELTASONE) 10 MG tablet    Sig: Prednisone 10 mg: take 4 tabs a day x 3 days; then 3 tabs a day x 4 days; then 2 tabs a day x 4 days, then 1 tab a day x 6 days, then stop. Take pc.    Dispense:  38 tablet    Refill:  1      Follow-up: Return in about 4 weeks (around 08/08/2022) for a follow-up visit.  Walker Kehr, MD

## 2022-07-11 NOTE — Assessment & Plan Note (Addendum)
Prednisone 10 mg: take 4 tabs a day x 3 days; then 3 tabs a day x 4 days; then 2 tabs a day x 4 days, then 1 tab a day x 6 days, then stop. Take pc.  CXR was ok Labs ok Hydroxyzine prn Loratidine po Triamcinolone tid RTC 4 wks - may need a skin bx to r/o systemic illness if not free of rash Stopped Losartan

## 2022-07-11 NOTE — Assessment & Plan Note (Addendum)
Start home PT

## 2022-07-14 DIAGNOSIS — R21 Rash and other nonspecific skin eruption: Secondary | ICD-10-CM | POA: Diagnosis not present

## 2022-07-14 DIAGNOSIS — Z7901 Long term (current) use of anticoagulants: Secondary | ICD-10-CM | POA: Diagnosis not present

## 2022-07-14 DIAGNOSIS — I2510 Atherosclerotic heart disease of native coronary artery without angina pectoris: Secondary | ICD-10-CM | POA: Diagnosis not present

## 2022-07-14 DIAGNOSIS — Z6826 Body mass index (BMI) 26.0-26.9, adult: Secondary | ICD-10-CM | POA: Diagnosis not present

## 2022-07-14 DIAGNOSIS — Z7952 Long term (current) use of systemic steroids: Secondary | ICD-10-CM | POA: Diagnosis not present

## 2022-07-14 DIAGNOSIS — E669 Obesity, unspecified: Secondary | ICD-10-CM | POA: Diagnosis not present

## 2022-07-14 DIAGNOSIS — I4891 Unspecified atrial fibrillation: Secondary | ICD-10-CM | POA: Diagnosis not present

## 2022-07-14 DIAGNOSIS — I10 Essential (primary) hypertension: Secondary | ICD-10-CM | POA: Diagnosis not present

## 2022-07-14 DIAGNOSIS — M2550 Pain in unspecified joint: Secondary | ICD-10-CM | POA: Diagnosis not present

## 2022-07-19 ENCOUNTER — Ambulatory Visit: Payer: Medicare Other | Attending: Cardiovascular Disease | Admitting: Cardiovascular Disease

## 2022-07-19 ENCOUNTER — Encounter: Payer: Self-pay | Admitting: Cardiovascular Disease

## 2022-07-19 VITALS — BP 142/76 | HR 75 | Ht 69.0 in | Wt 182.2 lb

## 2022-07-19 DIAGNOSIS — Z7901 Long term (current) use of anticoagulants: Secondary | ICD-10-CM | POA: Diagnosis not present

## 2022-07-19 DIAGNOSIS — Z952 Presence of prosthetic heart valve: Secondary | ICD-10-CM | POA: Diagnosis not present

## 2022-07-19 DIAGNOSIS — I10 Essential (primary) hypertension: Secondary | ICD-10-CM | POA: Insufficient documentation

## 2022-07-19 DIAGNOSIS — I4819 Other persistent atrial fibrillation: Secondary | ICD-10-CM | POA: Insufficient documentation

## 2022-07-19 DIAGNOSIS — I25118 Atherosclerotic heart disease of native coronary artery with other forms of angina pectoris: Secondary | ICD-10-CM | POA: Diagnosis not present

## 2022-07-19 NOTE — Patient Instructions (Signed)
Medication Instructions:  Your physician recommends that you continue on your current medications as directed. Please refer to the Current Medication list given to you today.  *If you need a refill on your cardiac medications before your next appointment, please call your pharmacy*  Lab Work: If you have labs (blood work) drawn today and your tests are completely normal, you will receive your results only by: [place] (if you have MyChart) OR A paper copy in the mail If you have any lab test that is abnormal or we need to change your treatment, we will call you to review the results.  Testing/Procedures: None ordered today.  Follow-Up: At [facility], you and your health needs are our priority.  As part of our continuing mission to provide you with exceptional heart care, we have created designated Provider Care Teams.  These Care Teams include your primary Cardiologist (physician) and Advanced Practice Providers (APPs -  Physician Assistants and Nurse Practitioners) who all work together to provide you with the care you need, when you need it.  We recommend signing up for the patient portal called "MyChart".  Sign up information is provided on this After Visit Summary.  MyChart is used to connect with patients for Virtual Visits (Telemedicine).  Patients are able to view lab/test results, encounter notes, upcoming appointments, etc.  Non-urgent messages can be sent to your provider as well.   To learn more about what you can do with MyChart, go to [email or web address].    Your next appointment:   6 month(s)  The format for your next appointment:   In Person  Provider:   Jenkins Rouge, MD     Important Information About Sugar

## 2022-07-20 DIAGNOSIS — E669 Obesity, unspecified: Secondary | ICD-10-CM | POA: Diagnosis not present

## 2022-07-20 DIAGNOSIS — I4891 Unspecified atrial fibrillation: Secondary | ICD-10-CM | POA: Diagnosis not present

## 2022-07-20 DIAGNOSIS — R21 Rash and other nonspecific skin eruption: Secondary | ICD-10-CM | POA: Diagnosis not present

## 2022-07-20 DIAGNOSIS — I10 Essential (primary) hypertension: Secondary | ICD-10-CM | POA: Diagnosis not present

## 2022-07-20 DIAGNOSIS — I2510 Atherosclerotic heart disease of native coronary artery without angina pectoris: Secondary | ICD-10-CM | POA: Diagnosis not present

## 2022-07-20 DIAGNOSIS — M2550 Pain in unspecified joint: Secondary | ICD-10-CM | POA: Diagnosis not present

## 2022-07-25 DIAGNOSIS — I4891 Unspecified atrial fibrillation: Secondary | ICD-10-CM | POA: Diagnosis not present

## 2022-07-25 DIAGNOSIS — I2510 Atherosclerotic heart disease of native coronary artery without angina pectoris: Secondary | ICD-10-CM | POA: Diagnosis not present

## 2022-07-25 DIAGNOSIS — E669 Obesity, unspecified: Secondary | ICD-10-CM | POA: Diagnosis not present

## 2022-07-25 DIAGNOSIS — M2550 Pain in unspecified joint: Secondary | ICD-10-CM | POA: Diagnosis not present

## 2022-07-25 DIAGNOSIS — I10 Essential (primary) hypertension: Secondary | ICD-10-CM | POA: Diagnosis not present

## 2022-07-25 DIAGNOSIS — R21 Rash and other nonspecific skin eruption: Secondary | ICD-10-CM | POA: Diagnosis not present

## 2022-08-02 DIAGNOSIS — Z7901 Long term (current) use of anticoagulants: Secondary | ICD-10-CM | POA: Diagnosis not present

## 2022-08-02 DIAGNOSIS — E669 Obesity, unspecified: Secondary | ICD-10-CM

## 2022-08-02 DIAGNOSIS — I4891 Unspecified atrial fibrillation: Secondary | ICD-10-CM | POA: Diagnosis not present

## 2022-08-02 DIAGNOSIS — R21 Rash and other nonspecific skin eruption: Secondary | ICD-10-CM | POA: Diagnosis not present

## 2022-08-02 DIAGNOSIS — M2550 Pain in unspecified joint: Secondary | ICD-10-CM

## 2022-08-02 DIAGNOSIS — Z7952 Long term (current) use of systemic steroids: Secondary | ICD-10-CM

## 2022-08-02 DIAGNOSIS — I10 Essential (primary) hypertension: Secondary | ICD-10-CM | POA: Diagnosis not present

## 2022-08-02 DIAGNOSIS — I2510 Atherosclerotic heart disease of native coronary artery without angina pectoris: Secondary | ICD-10-CM | POA: Diagnosis not present

## 2022-08-02 DIAGNOSIS — Z6826 Body mass index (BMI) 26.0-26.9, adult: Secondary | ICD-10-CM

## 2022-08-08 DIAGNOSIS — I2510 Atherosclerotic heart disease of native coronary artery without angina pectoris: Secondary | ICD-10-CM | POA: Diagnosis not present

## 2022-08-08 DIAGNOSIS — E669 Obesity, unspecified: Secondary | ICD-10-CM | POA: Diagnosis not present

## 2022-08-08 DIAGNOSIS — I10 Essential (primary) hypertension: Secondary | ICD-10-CM | POA: Diagnosis not present

## 2022-08-08 DIAGNOSIS — R21 Rash and other nonspecific skin eruption: Secondary | ICD-10-CM | POA: Diagnosis not present

## 2022-08-08 DIAGNOSIS — M2550 Pain in unspecified joint: Secondary | ICD-10-CM | POA: Diagnosis not present

## 2022-08-08 DIAGNOSIS — I4891 Unspecified atrial fibrillation: Secondary | ICD-10-CM | POA: Diagnosis not present

## 2022-08-10 DIAGNOSIS — I2510 Atherosclerotic heart disease of native coronary artery without angina pectoris: Secondary | ICD-10-CM | POA: Diagnosis not present

## 2022-08-10 DIAGNOSIS — I4891 Unspecified atrial fibrillation: Secondary | ICD-10-CM | POA: Diagnosis not present

## 2022-08-10 DIAGNOSIS — E669 Obesity, unspecified: Secondary | ICD-10-CM | POA: Diagnosis not present

## 2022-08-10 DIAGNOSIS — I10 Essential (primary) hypertension: Secondary | ICD-10-CM | POA: Diagnosis not present

## 2022-08-10 DIAGNOSIS — M2550 Pain in unspecified joint: Secondary | ICD-10-CM | POA: Diagnosis not present

## 2022-08-10 DIAGNOSIS — R21 Rash and other nonspecific skin eruption: Secondary | ICD-10-CM | POA: Diagnosis not present

## 2022-08-13 DIAGNOSIS — Z7952 Long term (current) use of systemic steroids: Secondary | ICD-10-CM | POA: Diagnosis not present

## 2022-08-13 DIAGNOSIS — I4891 Unspecified atrial fibrillation: Secondary | ICD-10-CM | POA: Diagnosis not present

## 2022-08-13 DIAGNOSIS — Z6826 Body mass index (BMI) 26.0-26.9, adult: Secondary | ICD-10-CM | POA: Diagnosis not present

## 2022-08-13 DIAGNOSIS — E669 Obesity, unspecified: Secondary | ICD-10-CM | POA: Diagnosis not present

## 2022-08-13 DIAGNOSIS — M2550 Pain in unspecified joint: Secondary | ICD-10-CM | POA: Diagnosis not present

## 2022-08-13 DIAGNOSIS — I2510 Atherosclerotic heart disease of native coronary artery without angina pectoris: Secondary | ICD-10-CM | POA: Diagnosis not present

## 2022-08-13 DIAGNOSIS — I10 Essential (primary) hypertension: Secondary | ICD-10-CM | POA: Diagnosis not present

## 2022-08-13 DIAGNOSIS — R21 Rash and other nonspecific skin eruption: Secondary | ICD-10-CM | POA: Diagnosis not present

## 2022-08-13 DIAGNOSIS — Z7901 Long term (current) use of anticoagulants: Secondary | ICD-10-CM | POA: Diagnosis not present

## 2022-08-14 DIAGNOSIS — R21 Rash and other nonspecific skin eruption: Secondary | ICD-10-CM | POA: Diagnosis not present

## 2022-08-14 DIAGNOSIS — M2550 Pain in unspecified joint: Secondary | ICD-10-CM | POA: Diagnosis not present

## 2022-08-14 DIAGNOSIS — I4891 Unspecified atrial fibrillation: Secondary | ICD-10-CM | POA: Diagnosis not present

## 2022-08-14 DIAGNOSIS — I10 Essential (primary) hypertension: Secondary | ICD-10-CM | POA: Diagnosis not present

## 2022-08-14 DIAGNOSIS — E669 Obesity, unspecified: Secondary | ICD-10-CM | POA: Diagnosis not present

## 2022-08-14 DIAGNOSIS — I2510 Atherosclerotic heart disease of native coronary artery without angina pectoris: Secondary | ICD-10-CM | POA: Diagnosis not present

## 2022-08-15 ENCOUNTER — Ambulatory Visit (INDEPENDENT_AMBULATORY_CARE_PROVIDER_SITE_OTHER): Payer: Medicare Other | Admitting: Internal Medicine

## 2022-08-15 ENCOUNTER — Encounter: Payer: Self-pay | Admitting: Internal Medicine

## 2022-08-15 VITALS — BP 128/82 | HR 65 | Temp 98.4°F | Ht 69.0 in | Wt 182.0 lb

## 2022-08-15 DIAGNOSIS — R21 Rash and other nonspecific skin eruption: Secondary | ICD-10-CM | POA: Diagnosis not present

## 2022-08-15 DIAGNOSIS — K5900 Constipation, unspecified: Secondary | ICD-10-CM | POA: Insufficient documentation

## 2022-08-15 DIAGNOSIS — K5904 Chronic idiopathic constipation: Secondary | ICD-10-CM

## 2022-08-15 DIAGNOSIS — R269 Unspecified abnormalities of gait and mobility: Secondary | ICD-10-CM | POA: Diagnosis not present

## 2022-08-15 MED ORDER — METHYLPREDNISOLONE 4 MG PO TBPK: ORAL_TABLET | ORAL | 0 refills | Status: DC

## 2022-08-15 MED ORDER — POLYETHYLENE GLYCOL 3350 17 GM/SCOOP PO POWD: 17.0000 g | Freq: Two times a day (BID) | ORAL | 3 refills | Status: AC | PRN

## 2022-08-15 MED ORDER — CETIRIZINE HCL 10 MG PO TABS: 10.0000 mg | ORAL_TABLET | Freq: Every day | ORAL | 3 refills | Status: AC

## 2022-08-15 NOTE — Assessment & Plan Note (Signed)
Better w/PT 

## 2022-08-15 NOTE — Assessment & Plan Note (Signed)
Miralax prn 

## 2022-08-15 NOTE — Assessment & Plan Note (Signed)
Resolved. For prophylaxis - use Zyrtec, PRN Hydroxyzine Derm ref If re-occurred: Medrol pack

## 2022-08-15 NOTE — Progress Notes (Signed)
Subjective:  Patient ID: Jason Hawkins, male    DOB: 02/03/1928  Age: 89 y.o. MRN: [id number]  CC: No chief complaint on file.   HPI Jason Hawkins presents for rash - no rash now. C/o occasional relapses of itching F/u on HTN - off Rx C/o constipation x years Here w/dtr Laretta Alstrom   Outpatient Medications Prior to Visit  Medication Sig Dispense Refill   apixaban (ELIQUIS) 2.5 MG TABS tablet Take 1 tablet (2.5 mg total) by mouth 2 (two) times daily. 180 tablet 3   Cholecalciferol (VITAMIN D3) 50 MCG (2000 UT) capsule Take 1 capsule (2,000 Units total) by mouth daily. 100 capsule 3   hydrOXYzine (VISTARIL) 25 MG capsule Take 1 capsule (25 mg total) by mouth every 8 (eight) hours as needed for itching. 60 capsule 1   loratadine (CLARITIN) 10 MG tablet Take 1 tablet (10 mg total) by mouth daily. 30 tablet 3   Multiple Vitamin (MULTIVITAMIN) capsule Take 1 capsule by mouth daily.     predniSONE (DELTASONE) 10 MG tablet Prednisone 10 mg: take 4 tabs a day x 3 days; then 3 tabs a day x 4 days; then 2 tabs a day x 4 days, then 1 tab a day x 6 days, then stop. Take pc. 38 tablet 1   triamcinolone cream (KENALOG) 0.1 % Apply 1 application topically 2 (two) times daily. 80 g 1   triamcinolone cream (KENALOG) 0.1 % Apply 1 Application topically 4 (four) times daily. (Patient not taking: Reported on 07/19/2022) 450 g 1   No facility-administered medications prior to visit.    ROS: Review of Systems  Constitutional:  Negative for appetite change, fatigue and unexpected weight change.  HENT:  Negative for congestion, nosebleeds, sneezing, sore throat and trouble swallowing.   Eyes:  Negative for itching and visual disturbance.  Respiratory:  Negative for cough.   Cardiovascular:  Negative for chest pain, palpitations and leg swelling.  Gastrointestinal:  Negative for abdominal distention, blood in stool, diarrhea and nausea.  Genitourinary:  Negative for frequency and hematuria.  Musculoskeletal:   Negative for back pain, gait problem, joint swelling and neck pain.  Skin:  Negative for rash.  Neurological:  Negative for dizziness, tremors, speech difficulty and weakness.  Psychiatric/Behavioral:  Negative for agitation, dysphoric mood, sleep disturbance and suicidal ideas. The patient is not nervous/anxious.     Objective:  BP 128/82 (BP Location: Left Arm, Patient Position: Sitting, Cuff Size: Normal)   Pulse 65   Temp 98.4 F (36.9 C) (Oral)   Ht '5\' 9"'$  (1.753 m)   Wt 182 lb (82.6 kg)   SpO2 97%   BMI 26.88 kg/m   BP Readings from Last 3 Encounters:  08/15/22 128/82  07/19/22 (!) 142/76  07/11/22 (!) 162/78    Wt Readings from Last 3 Encounters:  08/15/22 182 lb (82.6 kg)  07/19/22 182 lb 3.2 oz (82.6 kg)  07/11/22 178 lb (80.7 kg)    Physical Exam Constitutional:      General: He is not in acute distress.    Appearance: He is well-developed. He is obese.     Comments: NAD  Eyes:     Conjunctiva/sclera: Conjunctivae normal.     Pupils: Pupils are equal, round, and reactive to light.  Neck:     Thyroid: No thyromegaly.     Vascular: No JVD.  Cardiovascular:     Rate and Rhythm: Normal rate and regular rhythm.     Heart sounds: Normal heart sounds. No murmur  heard.    No friction rub. No gallop.  Pulmonary:     Effort: Pulmonary effort is normal. No respiratory distress.     Breath sounds: Normal breath sounds. No wheezing or rales.  Chest:     Chest wall: No tenderness.  Abdominal:     General: Bowel sounds are normal. There is no distension.     Palpations: Abdomen is soft. There is no mass.     Tenderness: There is no abdominal tenderness. There is no guarding or rebound.  Musculoskeletal:        General: No tenderness. Normal range of motion.     Cervical back: Normal range of motion.  Lymphadenopathy:     Cervical: No cervical adenopathy.  Skin:    General: Skin is warm and dry.     Findings: No rash.  Neurological:     Mental Status: He is  alert and oriented to person, place, and time.     Cranial Nerves: No cranial nerve deficit.     Motor: No abnormal muscle tone.     Coordination: Coordination normal.     Gait: Gait normal.     Deep Tendon Reflexes: Reflexes are normal and symmetric.  Psychiatric:        Behavior: Behavior normal.        Thought Content: Thought content normal.        Judgment: Judgment normal.   No rash now  Lab Results  Component Value Date   WBC 9.4 06/25/2022   HGB 13.5 06/25/2022   HCT 40.3 06/25/2022   PLT 251.0 06/25/2022   GLUCOSE 82 06/25/2022   CHOL 190 04/02/2022   TRIG 113.0 04/02/2022   HDL 53.30 04/02/2022   LDLCALC 114 (H) 04/02/2022   ALT 8 06/25/2022   AST 13 06/25/2022   NA 138 06/25/2022   K 3.9 06/25/2022   CL 101 06/25/2022   CREATININE 1.23 06/25/2022   BUN 27 (H) 06/25/2022   CO2 27 06/25/2022   TSH 1.96 04/02/2022   PSA 0.25 04/02/2022   INR 1.28 02/03/2016   HGBA1C 5.6 02/16/2016    US Renal  Result Date: 01/28/2017 CLINICAL DATA:  Recent UTI EXAM: RENAL / URINARY TRACT ULTRASOUND COMPLETE COMPARISON:  None. FINDINGS: Right Kidney: Length: 10 cm. Echogenicity within normal limits. No mass or hydronephrosis visualized. Left Kidney: Length: 10.9 cm. Echogenicity within normal limits. No mass or hydronephrosis visualized. Bladder: Appears normal for degree of bladder distention. Prostate is prominent indenting upon the inferior bladder. IMPRESSION: No acute abnormality noted.  Prominent prostate is seen. Electronically Signed   By: Inez Catalina M.D.   On: 01/28/2017 14:18    Assessment & Plan:   Problem List Items Addressed This Visit       Musculoskeletal and Integument   Rash in adult - Primary    Resolved. For prophylaxis - use Zyrtec, PRN Hydroxyzine Derm ref If re-occurred: Medrol pack      Relevant Orders   Ambulatory referral to Dermatology   Ambulatory referral to Dermatology     Other   Gait disorder    Better w/PT      Constipation     Miralax prn      Relevant Orders   Ambulatory referral to Dermatology      Meds ordered this encounter  Medications   cetirizine (ZYRTEC) 10 MG tablet    Sig: Take 1 tablet (10 mg total) by mouth daily.    Dispense:  90 tablet    Refill:  3   methylPREDNISolone (MEDROL DOSEPAK) 4 MG TBPK tablet    Sig: As directed prn bad rash    Dispense:  21 tablet    Refill:  0   polyethylene glycol powder (GLYCOLAX/MIRALAX) 17 GM/SCOOP powder    Sig: Take 17 g by mouth 2 (two) times daily as needed.    Dispense:  500 g    Refill:  3      Follow-up: Return in about 4 months (around 12/14/2022) for a follow-up visit.  Walker Kehr, MD

## 2022-08-22 DIAGNOSIS — I10 Essential (primary) hypertension: Secondary | ICD-10-CM | POA: Diagnosis not present

## 2022-08-22 DIAGNOSIS — I4891 Unspecified atrial fibrillation: Secondary | ICD-10-CM | POA: Diagnosis not present

## 2022-08-22 DIAGNOSIS — I2510 Atherosclerotic heart disease of native coronary artery without angina pectoris: Secondary | ICD-10-CM | POA: Diagnosis not present

## 2022-08-22 DIAGNOSIS — M2550 Pain in unspecified joint: Secondary | ICD-10-CM | POA: Diagnosis not present

## 2022-08-22 DIAGNOSIS — R21 Rash and other nonspecific skin eruption: Secondary | ICD-10-CM | POA: Diagnosis not present

## 2022-08-22 DIAGNOSIS — E669 Obesity, unspecified: Secondary | ICD-10-CM | POA: Diagnosis not present

## 2022-09-07 DIAGNOSIS — E669 Obesity, unspecified: Secondary | ICD-10-CM | POA: Diagnosis not present

## 2022-09-07 DIAGNOSIS — I2510 Atherosclerotic heart disease of native coronary artery without angina pectoris: Secondary | ICD-10-CM | POA: Diagnosis not present

## 2022-09-07 DIAGNOSIS — I10 Essential (primary) hypertension: Secondary | ICD-10-CM | POA: Diagnosis not present

## 2022-09-07 DIAGNOSIS — R21 Rash and other nonspecific skin eruption: Secondary | ICD-10-CM | POA: Diagnosis not present

## 2022-09-07 DIAGNOSIS — I4891 Unspecified atrial fibrillation: Secondary | ICD-10-CM | POA: Diagnosis not present

## 2022-09-07 DIAGNOSIS — M2550 Pain in unspecified joint: Secondary | ICD-10-CM | POA: Diagnosis not present

## 2022-10-10 ENCOUNTER — Telehealth: Payer: Medicare Other | Admitting: Physician Assistant

## 2022-10-10 ENCOUNTER — Telehealth: Payer: Self-pay | Admitting: Cardiovascular Disease

## 2022-10-10 DIAGNOSIS — U071 COVID-19: Secondary | ICD-10-CM

## 2022-10-10 MED ORDER — SPACER/AERO-HOLDING CHAMBERS DEVI: 0 refills | Status: AC

## 2022-10-10 MED ORDER — BENZONATATE 100 MG PO CAPS: 100.0000 mg | ORAL_CAPSULE | Freq: Three times a day (TID) | ORAL | 0 refills | Status: DC | PRN

## 2022-10-10 MED ORDER — ALBUTEROL SULFATE HFA 108 (90 BASE) MCG/ACT IN AERS: 1.0000 | INHALATION_SPRAY | Freq: Four times a day (QID) | RESPIRATORY_TRACT | 0 refills | Status: AC | PRN

## 2022-10-10 MED ORDER — MOLNUPIRAVIR EUA 200MG CAPSULE: 4.0000 | ORAL_CAPSULE | Freq: Two times a day (BID) | ORAL | 0 refills | Status: AC

## 2022-10-10 NOTE — Patient Instructions (Signed)
Jason Hawkins, thank you for joining Mar Daring, PA-C for today's virtual visit.  While this provider is not your primary care provider (PCP), if your PCP is located in our provider database this encounter information will be shared with them immediately following your visit.   [facility] account gives you access to today's visit and all your visits, tests, and labs performed at [facility] " click here if you don't have a [facility] account or go to [email or web address]  Consent: (Patient) Jason Hawkins provided verbal consent for this virtual visit at the beginning of the encounter.  Current Medications:  Current Outpatient Medications:    albuterol (VENTOLIN HFA) 108 (90 Base) MCG/ACT inhaler, Inhale 1-2 puffs into the lungs every 6 (six) hours as needed., Disp: 8 g, Rfl: 0   apixaban (ELIQUIS) 2.5 MG TABS tablet, Take 1 tablet (2.5 mg total) by mouth 2 (two) times daily., Disp: 180 tablet, Rfl: 3   benzonatate (TESSALON) 100 MG capsule, Take 1 capsule (100 mg total) by mouth 3 (three) times daily as needed., Disp: 30 capsule, Rfl: 0   cetirizine (ZYRTEC) 10 MG tablet, Take 1 tablet (10 mg total) by mouth daily., Disp: 90 tablet, Rfl: 3   Cholecalciferol (VITAMIN D3) 50 MCG (2000 UT) capsule, Take 1 capsule (2,000 Units total) by mouth daily., Disp: 100 capsule, Rfl: 3   hydrOXYzine (VISTARIL) 25 MG capsule, Take 1 capsule (25 mg total) by mouth every 8 (eight) hours as needed for itching., Disp: 60 capsule, Rfl: 1   loratadine (CLARITIN) 10 MG tablet, Take 1 tablet (10 mg total) by mouth daily., Disp: 30 tablet, Rfl: 3   methylPREDNISolone (MEDROL DOSEPAK) 4 MG TBPK tablet, As directed prn bad rash, Disp: 21 tablet, Rfl: 0   molnupiravir EUA (LAGEVRIO) 200 mg CAPS capsule, Take 4 capsules (800 mg total) by mouth 2 (two) times daily for 5 days., Disp: 40 capsule, Rfl: 0   Multiple Vitamin (MULTIVITAMIN) capsule, Take 1 capsule by mouth daily.,  Disp: , Rfl:    polyethylene glycol powder (GLYCOLAX/MIRALAX) 17 GM/SCOOP powder, Take 17 g by mouth 2 (two) times daily as needed., Disp: 500 g, Rfl: 3   predniSONE (DELTASONE) 10 MG tablet, Prednisone 10 mg: take 4 tabs a day x 3 days; then 3 tabs a day x 4 days; then 2 tabs a day x 4 days, then 1 tab a day x 6 days, then stop. Take pc., Disp: 38 tablet, Rfl: 1   Spacer/Aero-Holding Chambers DEVI, Use with inhaler, Disp: 1 each, Rfl: 0   triamcinolone cream (KENALOG) 0.1 %, Apply 1 application topically 2 (two) times daily., Disp: 80 g, Rfl: 1   Medications ordered in this encounter:  Meds ordered this encounter  Medications   molnupiravir EUA (LAGEVRIO) 200 mg CAPS capsule    Sig: Take 4 capsules (800 mg total) by mouth 2 (two) times daily for 5 days.    Dispense:  40 capsule    Refill:  0    Order Specific Question:   Supervising Provider    Answer:   Chase Picket [id number]   benzonatate (TESSALON) 100 MG capsule    Sig: Take 1 capsule (100 mg total) by mouth 3 (three) times daily as needed.    Dispense:  30 capsule    Refill:  0    Order Specific Question:   Supervising Provider    Answer:   Chase Picket [id number]   albuterol (VENTOLIN HFA) 108 (90 Base) MCG/ACT  inhaler    Sig: Inhale 1-2 puffs into the lungs every 6 (six) hours as needed.    Dispense:  8 g    Refill:  0    Order Specific Question:   Supervising Provider    Answer:   Chase Picket [id number]   Spacer/Aero-Holding Dorise Bullion    Sig: Use with inhaler    Dispense:  1 each    Refill:  0    Order Specific Question:   Supervising Provider    Answer:   Chase Picket [id number]     *If you need refills on other medications prior to your next appointment, please contact your pharmacy*  Follow-Up: Call back or seek an in-person evaluation if the symptoms worsen or if the condition fails to improve as anticipated.  [place] [phone]  Care Instructions:  Molnupiravir  Capsules What is this medication? MOLNUPIRAVIR (MOL nue PIR a vir) treats mild to moderate COVID-19. It may help people who are at high risk of developing severe illness. This medication works by limiting the spread of the virus in your body. The FDA has allowed the emergency use of this medication. This medicine may be used for other purposes; ask your health care provider or pharmacist if you have questions. COMMON BRAND NAME(S): LAGEVRIO What should I tell my care team before I take this medication? They need to know if you have any of these conditions: Any allergies Any serious illness An unusual or allergic reaction to molnupiravir, other medications, foods, dyes, or preservatives Pregnant or trying to get pregnant Breast-feeding How should I use this medication? Take this medication by mouth with water. Take it as directed on the prescription label at the same time every day. Do not cut, crush, or chew this medication. Swallow the capsules whole. You can take it with or without food. If it upsets your stomach, take it with food. Take all of it unless your care team tells you to stop it early. Keep taking it even if you think you are better. Talk to your care team about the use of this medication in children. Special care may be needed. Overdosage: If you think you have taken too much of this medicine contact a poison control center or emergency room at once. NOTE: This medicine is only for you. Do not share this medicine with others. What if I miss a dose? If you miss a dose, take it as soon as you can unless it is more than 10 hours late. If it is more than 10 hours late, skip the missed dose. Take the next dose at the normal time. Do not take extra or 2 doses at the same time to make up for the missed dose. What may interact with this medication? Interactions have not been studied. This list may not describe all possible interactions. Give your health care provider a list of all the  medicines, herbs, non-prescription drugs, or dietary supplements you use. Also tell them if you smoke, drink alcohol, or use illegal drugs. Some items may interact with your medicine. What should I watch for while using this medication? Your condition will be monitored carefully while you are receiving this medication. Visit your care team for regular checkups. Tell your care team if your symptoms do not start to get better or if they get worse. Do not become pregnant while taking this medication. You may need a pregnancy test before starting this medication. Women must use a reliable form of  birth control while taking this medication and for 4 days after stopping the medication. Women should inform their care team if they wish to become pregnant or think they might be pregnant. Men should not father a child while taking this medication and for 3 months after stopping it. There is potential for serious harm to an unborn child. Talk to your care team for more information. Do not breast-feed an infant while taking this medication and for 4 days after stopping the medication. What side effects may I notice from receiving this medication? Side effects that you should report to your care team as soon as possible: Allergic reactions--skin rash, itching, hives, swelling of the face, lips, tongue, or throat Side effects that usually do not require medical attention (report these to your care team if they continue or are bothersome): Diarrhea Dizziness Nausea This list may not describe all possible side effects. Call your doctor for medical advice about side effects. You may report side effects to FDA at 1-800-FDA-1088. Where should I keep my medication? Keep out of the reach of children and pets. Store at room temperature between 20 and 25 degrees C (68 and 77 degrees F). Get rid of any unused medication after the expiration date. To get rid of medications that are no longer needed or have expired: Take the  medication to a medication take-back program. Check with your pharmacy or law enforcement to find a location. If you cannot return the medication, check the label or package insert to see if the medication should be thrown out in the garbage or flushed down the toilet. If you are not sure, ask your care team. If it is safe to put it in the trash, take the medication out of the container. Mix the medication with cat litter, dirt, coffee grounds, or other unwanted substance. Seal the mixture in a bag or container. Put it in the trash. NOTE: This sheet is a summary. It may not cover all possible information. If you have questions about this medicine, talk to your doctor, pharmacist, or health care provider.  2023 Elsevier/Gold Standard (2020-08-01 00:00:00)    Isolation Instructions: You are to isolate at home for 5 days from onset of your symptoms. If you must be around other household members who do not have symptoms, you need to make sure that both you and the family members are masking consistently with a high-quality mask.  After day 5 of isolation, if you have had no fever within 24 hours and you are feeling better, you can end isolation but need to mask for an additional 5 days.  After day 5 if you have a fever or are having significant symptoms, please isolate for full 10 days.  If you note any worsening of symptoms despite treatment, please seek an in-person evaluation ASAP. If you note any significant shortness of breath or any chest pain, please seek ER evaluation. Please do not delay care!   COVID-19: What to Do if You Are Sick If you test positive and are an older adult or someone who is at high risk of getting very sick from COVID-19, treatment may be available. Contact a healthcare provider right away after a positive test to determine if you are eligible, even if your symptoms are mild right now. You can also visit a Test to Treat location and, if eligible, receive a prescription from  a provider. Don't delay: Treatment must be started within the first few days to be effective. If you have a fever,  cough, or other symptoms, you might have COVID-19. Most people have mild illness and are able to recover at home. If you are sick: Keep track of your symptoms. If you have an emergency warning sign (including trouble breathing), call 911. Steps to help prevent the spread of COVID-19 if you are sick If you are sick with COVID-19 or think you might have COVID-19, follow the steps below to care for yourself and to help protect other people in your home and community. Stay home except to get medical care Stay home. Most people with COVID-19 have mild illness and can recover at home without medical care. Do not leave your home, except to get medical care. Do not visit public areas and do not go to places where you are unable to wear a mask. Take care of yourself. Get rest and stay hydrated. Take over-the-counter medicines, such as acetaminophen, to help you feel better. Stay in touch with your doctor. Call before you get medical care. Be sure to get care if you have trouble breathing, or have any other emergency warning signs, or if you think it is an emergency. Avoid public transportation, ride-sharing, or taxis if possible. Get tested If you have symptoms of COVID-19, get tested. While waiting for test results, stay away from others, including staying apart from those living in your household. Get tested as soon as possible after your symptoms start. Treatments may be available for people with COVID-19 who are at risk for becoming very sick. Don't delay: Treatment must be started early to be effective--some treatments must begin within 5 days of your first symptoms. Contact your healthcare provider right away if your test result is positive to determine if you are eligible. Self-tests are one of several options for testing for the virus that causes COVID-19 and may be more convenient than  laboratory-based tests and point-of-care tests. Ask your healthcare provider or your local health department if you need help interpreting your test results. You can visit your state, tribal, local, and territorial health department's website to look for the latest local information on testing sites. Separate yourself from other people As much as possible, stay in a specific room and away from other people and pets in your home. If possible, you should use a separate bathroom. If you need to be around other people or animals in or outside of the home, wear a well-fitting mask. Tell your close contacts that they may have been exposed to COVID-19. An infected person can spread COVID-19 starting 48 hours (or 2 days) before the person has any symptoms or tests positive. By letting your close contacts know they may have been exposed to COVID-19, you are helping to protect everyone. See COVID-19 and Animals if you have questions about pets. If you are diagnosed with COVID-19, someone from the health department may call you. Answer the call to slow the spread. Monitor your symptoms Symptoms of COVID-19 include fever, cough, or other symptoms. Follow care instructions from your healthcare provider and local health department. Your local health authorities may give instructions on checking your symptoms and reporting information. When to seek emergency medical attention Look for emergency warning signs* for COVID-19. If someone is showing any of these signs, seek emergency medical care immediately: Trouble breathing Persistent pain or pressure in the chest New confusion Inability to wake or stay awake Pale, gray, or blue-colored skin, lips, or nail beds, depending on skin tone *This list is not all possible symptoms. Please call your medical provider for  any other symptoms that are severe or concerning to you. Call 911 or call ahead to your local emergency facility: Notify the operator that you are seeking  care for someone who has or may have COVID-19. Call ahead before visiting your doctor Call ahead. Many medical visits for routine care are being postponed or done by phone or telemedicine. If you have a medical appointment that cannot be postponed, call your doctor's office, and tell them you have or may have COVID-19. This will help the office protect themselves and other patients. If you are sick, wear a well-fitting mask You should wear a mask if you must be around other people or animals, including pets (even at home). Wear a mask with the best fit, protection, and comfort for you. You don't need to wear the mask if you are alone. If you can't put on a mask (because of trouble breathing, for example), cover your coughs and sneezes in some other way. Try to stay at least 6 feet away from other people. This will help protect the people around you. Masks should not be placed on young children under age 74 years, anyone who has trouble breathing, or anyone who is not able to remove the mask without help. Cover your coughs and sneezes Cover your mouth and nose with a tissue when you cough or sneeze. Throw away used tissues in a lined trash can. Immediately wash your hands with soap and water for at least 20 seconds. If soap and water are not available, clean your hands with an alcohol-based hand sanitizer that contains at least 60% alcohol. Clean your hands often Wash your hands often with soap and water for at least 20 seconds. This is especially important after blowing your nose, coughing, or sneezing; going to the bathroom; and before eating or preparing food. Use hand sanitizer if soap and water are not available. Use an alcohol-based hand sanitizer with at least 60% alcohol, covering all surfaces of your hands and rubbing them together until they feel dry. Soap and water are the best option, especially if hands are visibly dirty. Avoid touching your eyes, nose, and mouth with unwashed  hands. Handwashing Tips Avoid sharing personal household items Do not share dishes, drinking glasses, cups, eating utensils, towels, or bedding with other people in your home. Wash these items thoroughly after using them with soap and water or put in the dishwasher. Clean surfaces in your home regularly Clean and disinfect high-touch surfaces (for example, doorknobs, tables, handles, light switches, and countertops) in your "sick room" and bathroom. In shared spaces, you should clean and disinfect surfaces and items after each use by the person who is ill. If you are sick and cannot clean, a caregiver or other person should only clean and disinfect the area around you (such as your bedroom and bathroom) on an as needed basis. Your caregiver/other person should wait as long as possible (at least several hours) and wear a mask before entering, cleaning, and disinfecting shared spaces that you use. Clean and disinfect areas that may have blood, stool, or body fluids on them. Use household cleaners and disinfectants. Clean visible dirty surfaces with household cleaners containing soap or detergent. Then, use a household disinfectant. Use a product from [employer] List N: Disinfectants for Coronavirus ([id number]). Be sure to follow the instructions on the label to ensure safe and effective use of the product. Many products recommend keeping the surface wet with a disinfectant for a certain period of time (look at "contact time"  on the product label). You may also need to wear personal protective equipment, such as gloves, depending on the directions on the product label. Immediately after disinfecting, wash your hands with soap and water for 20 seconds. For completed guidance on cleaning and disinfecting your home, visit Complete Disinfection Guidance. Take steps to improve ventilation at home Improve ventilation (air flow) at home to help prevent from spreading COVID-19 to other people in your  household. Clear out COVID-19 virus particles in the air by opening windows, using air filters, and turning on fans in your home. Use this interactive tool to learn how to improve air flow in your home. When you can be around others after being sick with COVID-19 Deciding when you can be around others is different for different situations. Find out when you can safely end home isolation. For any additional questions about your care, contact your healthcare provider or state or local health department. 10/25/2020 Content source: [facility] ([facility]), Division of Viral Diseases This information is not intended to replace advice given to you by your health care provider. Make sure you discuss any questions you have with your health care provider. Document Revised: 12/08/2020 Document Reviewed: 12/08/2020 Elsevier Patient Education  2022 [employer].       If you have been instructed to have an in-person evaluation today at a local Urgent Care facility, please use the link below. It will take you to a list of all of our available [facility], including address, phone number and hours of operation. Please do not delay care.  [facility]  If you or a family member do not have a primary care provider, use the link below to schedule a visit and establish care. When you choose a [facility] primary care physician or advanced practice provider, you gain a long-term partner in health. Find a Primary Care Provider  Learn more about 's in-office and virtual care options: [facility]

## 2022-10-10 NOTE — Progress Notes (Signed)
Virtual Visit Consent   Mead Chea, you are scheduled for a virtual visit with a [facility] provider today. Just as with appointments in the office, your consent must be obtained to participate. Your consent will be active for this visit and any virtual visit you may have with one of our providers in the next 365 days. If you have a MyChart account, a copy of this consent can be sent to you electronically.  As this is a virtual visit, video technology does not allow for your provider to perform a traditional examination. This may limit your provider's ability to fully assess your condition. If your provider identifies any concerns that need to be evaluated in person or the need to arrange testing (such as labs, EKG, etc.), we will make arrangements to do so. Although advances in technology are sophisticated, we cannot ensure that it will always work on either your end or our end. If the connection with a video visit is poor, the visit may have to be switched to a telephone visit. With either a video or telephone visit, we are not always able to ensure that we have a secure connection.  By engaging in this virtual visit, you consent to the provision of healthcare and authorize for your insurance to be billed (if applicable) for the services provided during this visit. Depending on your insurance coverage, you may receive a charge related to this service.  I need to obtain your verbal consent now. Are you willing to proceed with your visit today? Jason Hawkins has provided verbal consent on 10/10/2022 for a virtual visit (video or telephone). Mar Daring, PA-C  Date: 10/10/2022 2:23 PM  Virtual Visit via Video Note   I, Mar Daring, connected with  Jason Hawkins  ([id number], [birth year]) on 10/10/22 at  2:15 PM EST by a video-enabled telemedicine application and verified that I am speaking with the correct person using two identifiers.  Location:  Patient: Virtual Visit Location  Patient: Home Provider: Virtual Visit Location Provider: Home Office   I discussed the limitations of evaluation and management by telemedicine and the availability of in person appointments. The patient expressed understanding and agreed to proceed.    History of Present Illness: Jason Hawkins is a 89 y.o. who identifies as a male who was assigned male at birth, and is being seen today for Covid 85.  HPI: URI  This is a new problem. The current episode started yesterday (Tested positive on at home test, symptoms started yesterday). The problem has been gradually worsening. There has been no fever. Associated symptoms include congestion, coughing, rhinorrhea (and post nasal drainage), a sore throat and wheezing. Pertinent negatives include no diarrhea, ear pain, headaches, nausea, plugged ear sensation, sinus pain, sneezing, swollen glands or vomiting. Associated symptoms comments: myalgias. He has tried nothing for the symptoms. The treatment provided no relief.      Problems:  Patient Active Problem List   Diagnosis Date Noted   Constipation 08/15/2022   Gait disorder 07/11/2022   Pruritic condition 06/25/2022   Memory problem 04/02/2022   Otitis externa 08/21/2021   Rash in adult 08/21/2021   Otitis media 04/06/2021   Hip pain, chronic, right 12/19/2020   DNR (do not resuscitate) 03/26/2019   Noncompliance with medications 03/24/2018   Hematuria 05/08/2016   Acute sinusitis 03/13/2016   Pressure ulcer 02/28/2016   Bradycardia 02/24/2016   Status post coronary artery stent placement    E-coli UTI    Acute blood loss anemia  Cognitive deficit, post-stroke 02/16/2016   Gait disturbance, post-stroke 02/16/2016   Hypotension 02/16/2016   Hyponatremia 02/16/2016   Benign essential HTN    Vascular headache    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   Chronic atrial fibrillation (HCC)    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 communicating artery aneurysm ([place])    Well adult  exam 01/04/2016   History of prostate cancer 04/30/2014   Fatigue 04/30/2014   PVD (peripheral vascular disease) with claudication ([place]) 12/21/2013   Dyslipidemia 12/21/2013   Bee sting allergy 03/17/2013   Chronic venous insufficiency 03/17/2013   Herpes zoster 03/17/2013   Depression 04/13/2011   Long term current use of anticoagulant therapy 12/14/2010   Edema 12/27/2009   S/P AVR 12/27/2009    Allergies:  Allergies  Allergen Reactions   Bee Venom Anaphylaxis   Codeine Nausea Only   Lisinopril Other (See Comments)    Per patient bp went to zero   Medications:  Current Outpatient Medications:    albuterol (VENTOLIN HFA) 108 (90 Base) MCG/ACT inhaler, Inhale 1-2 puffs into the lungs every 6 (six) hours as needed., Disp: 8 g, Rfl: 0   apixaban (ELIQUIS) 2.5 MG TABS tablet, Take 1 tablet (2.5 mg total) by mouth 2 (two) times daily., Disp: 180 tablet, Rfl: 3   benzonatate (TESSALON) 100 MG capsule, Take 1 capsule (100 mg total) by mouth 3 (three) times daily as needed., Disp: 30 capsule, Rfl: 0   cetirizine (ZYRTEC) 10 MG tablet, Take 1 tablet (10 mg total) by mouth daily., Disp: 90 tablet, Rfl: 3   Cholecalciferol (VITAMIN D3) 50 MCG (2000 UT) capsule, Take 1 capsule (2,000 Units total) by mouth daily., Disp: 100 capsule, Rfl: 3   hydrOXYzine (VISTARIL) 25 MG capsule, Take 1 capsule (25 mg total) by mouth every 8 (eight) hours as needed for itching., Disp: 60 capsule, Rfl: 1   loratadine (CLARITIN) 10 MG tablet, Take 1 tablet (10 mg total) by mouth daily., Disp: 30 tablet, Rfl: 3   methylPREDNISolone (MEDROL DOSEPAK) 4 MG TBPK tablet, As directed prn bad rash, Disp: 21 tablet, Rfl: 0   molnupiravir EUA (LAGEVRIO) 200 mg CAPS capsule, Take 4 capsules (800 mg total) by mouth 2 (two) times daily for 5 days., Disp: 40 capsule, Rfl: 0   Multiple Vitamin (MULTIVITAMIN) capsule, Take 1 capsule by mouth daily., Disp: , Rfl:    polyethylene glycol powder (GLYCOLAX/MIRALAX) 17 GM/SCOOP powder,  Take 17 g by mouth 2 (two) times daily as needed., Disp: 500 g, Rfl: 3   predniSONE (DELTASONE) 10 MG tablet, Prednisone 10 mg: take 4 tabs a day x 3 days; then 3 tabs a day x 4 days; then 2 tabs a day x 4 days, then 1 tab a day x 6 days, then stop. Take pc., Disp: 38 tablet, Rfl: 1   Spacer/Aero-Holding Chambers DEVI, Use with inhaler, Disp: 1 each, Rfl: 0   triamcinolone cream (KENALOG) 0.1 %, Apply 1 application topically 2 (two) times daily., Disp: 80 g, Rfl: 1  Observations/Objective: Patient is well-developed, well-nourished in no acute distress.  Resting comfortably at home.  Head is normocephalic, atraumatic.  No labored breathing.  Speech is clear and coherent with logical content.  Patient is alert and oriented at baseline.    Assessment and Plan: 1. COVID-19 - molnupiravir EUA (LAGEVRIO) 200 mg CAPS capsule; Take 4 capsules (800 mg total) by mouth 2 (two) times daily for 5 days.  Dispense: 40 capsule; Refill: 0 - benzonatate (TESSALON) 100 MG capsule; Take 1  capsule (100 mg total) by mouth 3 (three) times daily as needed.  Dispense: 30 capsule; Refill: 0 - albuterol (VENTOLIN HFA) 108 (90 Base) MCG/ACT inhaler; Inhale 1-2 puffs into the lungs every 6 (six) hours as needed.  Dispense: 8 g; Refill: 0 - Spacer/Aero-Holding Dorise Bullion; Use with inhaler  Dispense: 1 each; Refill: 0  - Continue OTC symptomatic management of choice - Will send OTC vitamins and supplement information through AVS - Molnupiravir, Albuterol, and Tessalon perles prescribed - Patient enrolled in MyChart symptom monitoring - Push fluids - Rest as needed - Discussed return precautions and when to seek in-person evaluation, sent via AVS as well   Follow Up Instructions: I discussed the assessment and treatment plan with the patient. The patient was provided an opportunity to ask questions and all were answered. The patient agreed with the plan and demonstrated an understanding of the instructions.  A  copy of instructions were sent to the patient via MyChart unless otherwise noted below.    The patient was advised to call back or seek an in-person evaluation if the symptoms worsen or if the condition fails to improve as anticipated.  Time:  I spent 15 minutes with the patient via telehealth technology discussing the above problems/concerns.    Mar Daring, PA-C

## 2022-10-10 NOTE — Telephone Encounter (Signed)
Pt's son, [name] is calling to see if Dr. Johnsie Cancel will call the pt in Paxlovid because the pt tested positive for covid this morning. He asked that if it is ok, for it to be sent to -  [facility], [place]. [address] [place] [address]

## 2022-10-10 NOTE — Telephone Encounter (Signed)
Left message for patient's son to call back. Patient will need to call his PCP about taking this medication. Will let him know when he calls back.

## 2022-10-15 ENCOUNTER — Encounter: Payer: Self-pay | Admitting: Internal Medicine

## 2022-10-29 ENCOUNTER — Encounter: Payer: Self-pay | Admitting: Internal Medicine

## 2022-10-29 ENCOUNTER — Ambulatory Visit (INDEPENDENT_AMBULATORY_CARE_PROVIDER_SITE_OTHER): Payer: Medicare Other | Admitting: Internal Medicine

## 2022-10-29 VITALS — BP 100/68 | HR 40 | Temp 97.8°F | Ht 69.0 in | Wt 179.0 lb

## 2022-10-29 DIAGNOSIS — R269 Unspecified abnormalities of gait and mobility: Secondary | ICD-10-CM

## 2022-10-29 DIAGNOSIS — G9332 Myalgic encephalomyelitis/chronic fatigue syndrome: Secondary | ICD-10-CM | POA: Diagnosis not present

## 2022-10-29 DIAGNOSIS — R5382 Chronic fatigue, unspecified: Secondary | ICD-10-CM

## 2022-10-29 DIAGNOSIS — F339 Major depressive disorder, recurrent, unspecified: Secondary | ICD-10-CM

## 2022-10-29 DIAGNOSIS — U099 Post covid-19 condition, unspecified: Secondary | ICD-10-CM | POA: Diagnosis not present

## 2022-10-29 DIAGNOSIS — L299 Pruritus, unspecified: Secondary | ICD-10-CM

## 2022-10-29 LAB — COMPREHENSIVE METABOLIC PANEL
ALT: 10 U/L (ref 0–53)
AST: 18 U/L (ref 0–37)
Albumin: 4.2 g/dL (ref 3.5–5.2)
Alkaline Phosphatase: 62 U/L (ref 39–117)
BUN: 24 mg/dL — ABNORMAL HIGH (ref 6–23)
CO2: 26 mEq/L (ref 19–32)
Calcium: 9.4 mg/dL (ref 8.4–10.5)
Chloride: 104 mEq/L (ref 96–112)
Creatinine, Ser: 1.09 mg/dL (ref 0.40–1.50)
GFR: 58.78 mL/min — ABNORMAL LOW (ref >60.00)
Glucose, Bld: 84 mg/dL (ref 70–99)
Potassium: 4.2 mEq/L (ref 3.5–5.1)
Sodium: 139 mEq/L (ref 135–145)
Total Bilirubin: 1.1 mg/dL (ref 0.2–1.2)
Total Protein: 7.1 g/dL (ref 6.0–8.3)

## 2022-10-29 LAB — CBC WITH DIFFERENTIAL/PLATELET
Basophils Absolute: 0 10*3/uL (ref 0.0–0.1)
Basophils Relative: 0.5 % (ref 0.0–3.0)
Eosinophils Absolute: 0.3 10*3/uL (ref 0.0–0.7)
Eosinophils Relative: 3.1 % (ref 0.0–5.0)
HCT: 39.7 % (ref 39.0–52.0)
Hemoglobin: 13.2 g/dL (ref 13.0–17.0)
Lymphocytes Relative: 20.3 % (ref 12.0–46.0)
Lymphs Abs: 2 10*3/uL (ref 0.7–4.0)
MCHC: 33.3 g/dL (ref 30.0–36.0)
MCV: 92 fl (ref 78.0–100.0)
Monocytes Absolute: 1.2 10*3/uL — ABNORMAL HIGH (ref 0.1–1.0)
Monocytes Relative: 12.2 % — ABNORMAL HIGH (ref 3.0–12.0)
Neutro Abs: 6.2 10*3/uL (ref 1.4–7.7)
Neutrophils Relative %: 63.9 % (ref 43.0–77.0)
Platelets: 229 10*3/uL (ref 150.0–400.0)
RBC: 4.31 Mil/uL (ref 4.22–5.81)
RDW: 14.1 % (ref 11.5–15.5)
WBC: 9.7 10*3/uL (ref 4.0–10.5)

## 2022-10-29 MED ORDER — METHYLPREDNISOLONE 4 MG PO TBPK: ORAL_TABLET | ORAL | 0 refills | Status: DC

## 2022-10-29 MED ORDER — TRIAMCINOLONE ACETONIDE 0.1 % EX CREA: 1.0000 | TOPICAL_CREAM | Freq: Two times a day (BID) | CUTANEOUS | 1 refills | Status: AC

## 2022-10-29 MED ORDER — ESCITALOPRAM OXALATE 5 MG PO TABS: 5.0000 mg | ORAL_TABLET | Freq: Every morning | ORAL | 5 refills | Status: DC

## 2022-10-29 MED ORDER — HYDROXYZINE PAMOATE 25 MG PO CAPS: 25.0000 mg | ORAL_CAPSULE | Freq: Every evening | ORAL | 3 refills | Status: DC

## 2022-10-29 NOTE — Assessment & Plan Note (Signed)
Using a cane 

## 2022-10-29 NOTE — Assessment & Plan Note (Signed)
Will watch 

## 2022-10-29 NOTE — Progress Notes (Signed)
Subjective:  Patient ID: Jason Hawkins, male    DOB: 25-Oct-1927  Age: 89 y.o. MRN: [id number]  CC: Fatigue (Depression, Trouble sleeping, )   HPI Jason Hawkins presents for being depressed, sad after he came back from [facility]. C/o insomnia... C/o fatigue after COVID, depressed F/u of itching - worse here   Outpatient Medications Prior to Visit  Medication Sig Dispense Refill   albuterol (VENTOLIN HFA) 108 (90 Base) MCG/ACT inhaler Inhale 1-2 puffs into the lungs every 6 (six) hours as needed. 8 g 0   apixaban (ELIQUIS) 2.5 MG TABS tablet Take 1 tablet (2.5 mg total) by mouth 2 (two) times daily. 180 tablet 3   cetirizine (ZYRTEC) 10 MG tablet Take 1 tablet (10 mg total) by mouth daily. 90 tablet 3   Cholecalciferol (VITAMIN D3) 50 MCG (2000 UT) capsule Take 1 capsule (2,000 Units total) by mouth daily. 100 capsule 3   loratadine (CLARITIN) 10 MG tablet Take 1 tablet (10 mg total) by mouth daily. 30 tablet 3   Multiple Vitamin (MULTIVITAMIN) capsule Take 1 capsule by mouth daily.     polyethylene glycol powder (GLYCOLAX/MIRALAX) 17 GM/SCOOP powder Take 17 g by mouth 2 (two) times daily as needed. 500 g 3   Spacer/Aero-Holding Chambers DEVI Use with inhaler 1 each 0   hydrOXYzine (VISTARIL) 25 MG capsule Take 1 capsule (25 mg total) by mouth every 8 (eight) hours as needed for itching. 60 capsule 1   predniSONE (DELTASONE) 10 MG tablet Prednisone 10 mg: take 4 tabs a day x 3 days; then 3 tabs a day x 4 days; then 2 tabs a day x 4 days, then 1 tab a day x 6 days, then stop. Take pc. 38 tablet 1   triamcinolone cream (KENALOG) 0.1 % Apply 1 application topically 2 (two) times daily. 80 g 1   benzonatate (TESSALON) 100 MG capsule Take 1 capsule (100 mg total) by mouth 3 (three) times daily as needed. (Patient not taking: Reported on 10/29/2022) 30 capsule 0   methylPREDNISolone (MEDROL DOSEPAK) 4 MG TBPK tablet As directed prn bad rash (Patient not taking: Reported on 10/29/2022) 21 tablet 0   No  facility-administered medications prior to visit.    ROS: Review of Systems  Constitutional:  Positive for fatigue. Negative for appetite change and unexpected weight change.  HENT:  Negative for congestion, nosebleeds, sneezing, sore throat and trouble swallowing.   Eyes:  Negative for itching and visual disturbance.  Respiratory:  Negative for cough.   Cardiovascular:  Negative for chest pain, palpitations and leg swelling.  Gastrointestinal:  Negative for abdominal distention, blood in stool, diarrhea and nausea.  Genitourinary:  Negative for frequency and hematuria.  Musculoskeletal:  Negative for back pain, gait problem, joint swelling and neck pain.  Skin:  Negative for rash.  Neurological:  Positive for weakness. Negative for dizziness, tremors and speech difficulty.  Psychiatric/Behavioral:  Positive for decreased concentration and dysphoric mood. Negative for agitation, sleep disturbance and suicidal ideas. The patient is nervous/anxious.     Objective:  BP 100/68 (BP Location: Right Arm, Patient Position: Sitting, Cuff Size: Large)   Pulse (!) 40   Temp 97.8 F (36.6 C) (Oral)   Ht 5\' 9"  (1.753 m)   Wt 179 lb (81.2 kg)   SpO2 98%   BMI 26.43 kg/m   BP Readings from Last 3 Encounters:  10/29/22 100/68  08/15/22 128/82  07/19/22 (!) 142/76    Wt Readings from Last 3 Encounters:  10/29/22 179  lb (81.2 kg)  08/15/22 182 lb (82.6 kg)  07/19/22 182 lb 3.2 oz (82.6 kg)    Physical Exam Constitutional:      General: He is not in acute distress.    Appearance: He is well-developed. He is obese.     Comments: NAD  Eyes:     Conjunctiva/sclera: Conjunctivae normal.     Pupils: Pupils are equal, round, and reactive to light.  Neck:     Thyroid: No thyromegaly.     Vascular: No JVD.  Cardiovascular:     Rate and Rhythm: Normal rate and regular rhythm.     Heart sounds: Normal heart sounds. No murmur heard.    No friction rub. No gallop.  Pulmonary:     Effort:  Pulmonary effort is normal. No respiratory distress.     Breath sounds: Normal breath sounds. No wheezing or rales.  Chest:     Chest wall: No tenderness.  Abdominal:     General: Bowel sounds are normal. There is no distension.     Palpations: Abdomen is soft. There is no mass.     Tenderness: There is no abdominal tenderness. There is no guarding or rebound.  Musculoskeletal:        General: No tenderness. Normal range of motion.     Cervical back: Normal range of motion.  Lymphadenopathy:     Cervical: No cervical adenopathy.  Skin:    General: Skin is warm and dry.     Findings: No rash.  Neurological:     Mental Status: He is alert and oriented to person, place, and time.     Cranial Nerves: No cranial nerve deficit.     Motor: No abnormal muscle tone.     Coordination: Coordination normal.     Gait: Gait normal.     Deep Tendon Reflexes: Reflexes are normal and symmetric.  Psychiatric:        Behavior: Behavior normal.        Thought Content: Thought content normal.        Judgment: Judgment normal.   No rash Using a cane Sad  Lab Results  Component Value Date   WBC 9.4 06/25/2022   HGB 13.5 06/25/2022   HCT 40.3 06/25/2022   PLT 251.0 06/25/2022   GLUCOSE 82 06/25/2022   CHOL 190 04/02/2022   TRIG 113.0 04/02/2022   HDL 53.30 04/02/2022   LDLCALC 114 (H) 04/02/2022   ALT 8 06/25/2022   AST 13 06/25/2022   NA 138 06/25/2022   K 3.9 06/25/2022   CL 101 06/25/2022   CREATININE 1.23 06/25/2022   BUN 27 (H) 06/25/2022   CO2 27 06/25/2022   TSH 1.96 04/02/2022   PSA 0.25 04/02/2022   INR 1.28 02/03/2016   HGBA1C 5.6 02/16/2016    US Renal  Result Date: 01/28/2017 CLINICAL DATA:  Recent UTI EXAM: RENAL / URINARY TRACT ULTRASOUND COMPLETE COMPARISON:  None. FINDINGS: Right Kidney: Length: 10 cm. Echogenicity within normal limits. No mass or hydronephrosis visualized. Left Kidney: Length: 10.9 cm. Echogenicity within normal limits. No mass or hydronephrosis  visualized. Bladder: Appears normal for degree of bladder distention. Prostate is prominent indenting upon the inferior bladder. IMPRESSION: No acute abnormality noted.  Prominent prostate is seen. Electronically Signed   By: Inez Catalina M.D.   On: 01/28/2017 14:18    Assessment & Plan:   Problem List Items Addressed This Visit       Other   Pruritic condition    Hydroxyzine  25 mg qhs Skin bx if rash Medrol pack Labs       Post-COVID chronic fatigue    Will watch      Relevant Orders   CBC with Differential/Platelet   Comprehensive metabolic panel   TSH   Gait disorder    Using a cane      Fatigue - Primary    Multifactorial Hydroxyzine at HS        Relevant Orders   CBC with Differential/Platelet   Comprehensive metabolic panel   TSH   Depression    Worse Start Lexapro - q am      Relevant Medications   hydrOXYzine (VISTARIL) 25 MG capsule   escitalopram (LEXAPRO) 5 MG tablet   Other Relevant Orders   CBC with Differential/Platelet   Comprehensive metabolic panel   TSH      Meds ordered this encounter  Medications   hydrOXYzine (VISTARIL) 25 MG capsule    Sig: Take 1-2 capsules (25-50 mg total) by mouth at bedtime.    Dispense:  60 capsule    Refill:  3   triamcinolone cream (KENALOG) 0.1 %    Sig: Apply 1 Application topically 2 (two) times daily.    Dispense:  450 g    Refill:  1   escitalopram (LEXAPRO) 5 MG tablet    Sig: Take 1 tablet (5 mg total) by mouth in the morning.    Dispense:  30 tablet    Refill:  5   methylPREDNISolone (MEDROL DOSEPAK) 4 MG TBPK tablet    Sig: As directed    Dispense:  21 tablet    Refill:  0      Follow-up: Return in about 2 months (around 12/29/2022) for a follow-up visit.  Walker Kehr, MD

## 2022-10-29 NOTE — Assessment & Plan Note (Signed)
Multifactorial Hydroxyzine at HS

## 2022-10-29 NOTE — Assessment & Plan Note (Signed)
Hydroxyzine 25 mg qhs Skin bx if rash Medrol pack Labs

## 2022-10-29 NOTE — Assessment & Plan Note (Signed)
Worse Start Lexapro - q am

## 2022-10-30 LAB — TSH: TSH: 1.79 u[IU]/mL (ref 0.35–5.50)

## 2022-11-07 ENCOUNTER — Ambulatory Visit: Payer: Medicare Other | Admitting: Internal Medicine

## 2022-11-20 ENCOUNTER — Other Ambulatory Visit: Payer: Self-pay | Admitting: Internal Medicine

## 2022-12-12 DIAGNOSIS — L298 Other pruritus: Secondary | ICD-10-CM | POA: Diagnosis not present

## 2022-12-13 ENCOUNTER — Other Ambulatory Visit: Payer: Self-pay | Admitting: *Deleted

## 2022-12-13 MED ORDER — ESCITALOPRAM OXALATE 5 MG PO TABS: 5.0000 mg | ORAL_TABLET | Freq: Every morning | ORAL | 1 refills | Status: DC

## 2022-12-19 DIAGNOSIS — H25811 Combined forms of age-related cataract, right eye: Secondary | ICD-10-CM | POA: Diagnosis not present

## 2022-12-19 DIAGNOSIS — H25812 Combined forms of age-related cataract, left eye: Secondary | ICD-10-CM | POA: Diagnosis not present

## 2022-12-25 DIAGNOSIS — H25812 Combined forms of age-related cataract, left eye: Secondary | ICD-10-CM | POA: Diagnosis not present

## 2022-12-25 DIAGNOSIS — H25811 Combined forms of age-related cataract, right eye: Secondary | ICD-10-CM | POA: Diagnosis not present

## 2023-05-03 NOTE — Progress Notes (Unsigned)
Patient ID: Jason Hawkins, male   DOB: 13-Dec-1927, 89 y.o.   MRN: [id number]    89 y.o. history of AVR and afib.  Had [facility] with coil embolization of artery on 02/01/16. Complicated by STEMI while in rehab. Coumadin changed to eliquis.  Cath with small 80% lesion in OM and occluded distal circumflex that was stented Last echo in July 2017 with EF 60-65% normal bioprosthetic valve with mean gradient 8 peak 17 mmHg   He still has a fatalistic attitude and does not really want to take any meds. When seen by NP 01/23/21 was only taking eliquis once/day BP elevated and started on Losartan 25 mg daily   Tested positive for COVID 04/17/21 RX with molnupiravir  He continues to wish he had died when his wife of 61 years past  Daughter in Social worker [name] looks in on him daily  Says he cut back his beer intake Likes to feed the birds Memory getting worse with poor hearing   Seen by primary 06/25/22 for diffuse rash Losartan d/c ? Why. Rx with depomedrol, Hydroxyzine , Loratidine and triamcinolone  CXR NAD WBC normal but had elevated relative eosinophil count 7.3%  Rash is gone Drinking a bit less  ***   ROS: Denies fever, malais, weight loss, blurry vision, decreased visual acuity, cough, sputum, SOB, hemoptysis, pleuritic pain, palpitaitons, heartburn, abdominal pain, melena, lower extremity edema, claudication, or rash.  All other systems reviewed and negative  General: Affect appropriate Crotchety elderly male  HEENT: normal Neck supple with no adenopathy JVP normal no bruits no thyromegaly Lungs clear with no wheezing and good diaphragmatic motion Heart:  S1/S2 click SEM  murmur, no rub, gallop or click PMI normal post sternotomy  Abdomen: benighn, BS positve, no tenderness, no AAA no bruit.  No HSM or HJR Distal pulses intact with no bruits Plus 2 bilateral  Edema with stasis  Skin:  improved rash on chest    Current Outpatient Medications  Medication Sig Dispense Refill   albuterol  (VENTOLIN HFA) 108 (90 Base) MCG/ACT inhaler Inhale 1-2 puffs into the lungs every 6 (six) hours as needed. 8 g 0   apixaban (ELIQUIS) 2.5 MG TABS tablet Take 1 tablet (2.5 mg total) by mouth 2 (two) times daily. 180 tablet 3   benzonatate (TESSALON) 100 MG capsule Take 1 capsule (100 mg total) by mouth 3 (three) times daily as needed. (Patient not taking: Reported on 10/29/2022) 30 capsule 0   cetirizine (ZYRTEC) 10 MG tablet Take 1 tablet (10 mg total) by mouth daily. 90 tablet 3   Cholecalciferol (VITAMIN D3) 50 MCG (2000 UT) capsule Take 1 capsule (2,000 Units total) by mouth daily. 100 capsule 3   escitalopram (LEXAPRO) 5 MG tablet Take 1 tablet (5 mg total) by mouth in the morning. Follow-up appt due in August must see MD for refills 90 tablet 1   hydrOXYzine (VISTARIL) 25 MG capsule TAKE 1 OR 2 CAPSULES BY MOUTH AT BEDTIME AS NEEDED 180 capsule 1   loratadine (CLARITIN) 10 MG tablet Take 1 tablet (10 mg total) by mouth daily. 30 tablet 3   methylPREDNISolone (MEDROL DOSEPAK) 4 MG TBPK tablet As directed 21 tablet 0   Multiple Vitamin (MULTIVITAMIN) capsule Take 1 capsule by mouth daily.     polyethylene glycol powder (GLYCOLAX/MIRALAX) 17 GM/SCOOP powder Take 17 g by mouth 2 (two) times daily as needed. 500 g 3   Spacer/Aero-Holding Chambers DEVI Use with inhaler 1 each 0   triamcinolone cream (KENALOG) 0.1 %  Apply 1 Application topically 2 (two) times daily. 450 g 1   No current facility-administered medications for this visit.    Allergies  Bee venom, Codeine, and Lisinopril  Electrocardiogram:    Afib rate 68 LAD no acute changes   Assessment and Plan AVR: normal valve clicks SBE  No echo needed  Edema  Venous disease refuses to take diuretic. Discussed elevation and low sodium diet  PAF:  ECG with afib today rate fine on eliquis for stroke prophylaxis but only taking low dose daily He promised to take bid  HLD Previously on statin diet Rx only at this point/age  CAD:  Stable no  angina  Stent to distal circumflex 02/16/16  SAH:  No residual neurologic deficits. Fortunate coiled aneurysm so hopefully risk of recurrence On blood thinners will be less  HTN:  started on losartan 01/23/21 improved  COVID:  Diagnosed 04/17/21 Rx with molnupiravir improved  Rash:  diffuse upper body with pruritus and elevated relative eosinophil count ARB d/c Rx as above   Depression:  started on lexapro by primary. Has not been same for a long time since his wife died   No testing   F/U in 6 months    [name]

## 2023-05-07 ENCOUNTER — Ambulatory Visit (INDEPENDENT_AMBULATORY_CARE_PROVIDER_SITE_OTHER): Payer: Medicare Other | Admitting: Internal Medicine

## 2023-05-07 ENCOUNTER — Encounter: Payer: Self-pay | Admitting: Internal Medicine

## 2023-05-07 VITALS — BP 120/70 | HR 55 | Temp 98.2°F | Ht 69.0 in | Wt 178.0 lb

## 2023-05-07 DIAGNOSIS — I482 Chronic atrial fibrillation, unspecified: Secondary | ICD-10-CM | POA: Diagnosis not present

## 2023-05-07 DIAGNOSIS — Z91030 Bee allergy status: Secondary | ICD-10-CM | POA: Diagnosis not present

## 2023-05-07 DIAGNOSIS — E785 Hyperlipidemia, unspecified: Secondary | ICD-10-CM

## 2023-05-07 DIAGNOSIS — F339 Major depressive disorder, recurrent, unspecified: Secondary | ICD-10-CM

## 2023-05-07 MED ORDER — APIXABAN 2.5 MG PO TABS: 2.5000 mg | ORAL_TABLET | Freq: Two times a day (BID) | ORAL | 3 refills | Status: DC

## 2023-05-07 MED ORDER — METHYLPREDNISOLONE 4 MG PO TBPK: ORAL_TABLET | ORAL | 0 refills | Status: AC

## 2023-05-07 MED ORDER — EPINEPHRINE 0.3 MG/0.3ML IJ SOAJ: 0.3000 mg | INTRAMUSCULAR | 1 refills | Status: AC | PRN

## 2023-05-07 MED ORDER — ESCITALOPRAM OXALATE 5 MG PO TABS: 5.0000 mg | ORAL_TABLET | Freq: Every morning | ORAL | 1 refills | Status: DC

## 2023-05-07 NOTE — Assessment & Plan Note (Signed)
On Eliquis

## 2023-05-07 NOTE — Assessment & Plan Note (Signed)
Labs

## 2023-05-07 NOTE — Assessment & Plan Note (Signed)
Worse Start Lexapro - q am

## 2023-05-07 NOTE — Progress Notes (Signed)
Subjective:  Patient ID: Jason Hawkins, male    DOB: 14-Mar-1928  Age: 89 y.o. MRN: [id number]  CC: Medication Refill (Pt needs rx refill./Pt is experiencing weakness in legs and with walking. Pt is having rt shoulder pain.)   HPI Jason Hawkins presents for anticoagulation, anxiety, Afib anaphylaxis  Outpatient Medications Prior to Visit  Medication Sig Dispense Refill   albuterol (VENTOLIN HFA) 108 (90 Base) MCG/ACT inhaler Inhale 1-2 puffs into the lungs every 6 (six) hours as needed. 8 g 0   cetirizine (ZYRTEC) 10 MG tablet Take 1 tablet (10 mg total) by mouth daily. 90 tablet 3   Cholecalciferol (VITAMIN D3) 50 MCG (2000 UT) capsule Take 1 capsule (2,000 Units total) by mouth daily. 100 capsule 3   hydrOXYzine (VISTARIL) 25 MG capsule TAKE 1 OR 2 CAPSULES BY MOUTH AT BEDTIME AS NEEDED 180 capsule 1   loratadine (CLARITIN) 10 MG tablet Take 1 tablet (10 mg total) by mouth daily. 30 tablet 3   Multiple Vitamin (MULTIVITAMIN) capsule Take 1 capsule by mouth daily.     polyethylene glycol powder (GLYCOLAX/MIRALAX) 17 GM/SCOOP powder Take 17 g by mouth 2 (two) times daily as needed. 500 g 3   Spacer/Aero-Holding Chambers DEVI Use with inhaler 1 each 0   triamcinolone cream (KENALOG) 0.1 % Apply 1 Application topically 2 (two) times daily. 450 g 1   apixaban (ELIQUIS) 2.5 MG TABS tablet Take 1 tablet (2.5 mg total) by mouth 2 (two) times daily. 180 tablet 3   escitalopram (LEXAPRO) 5 MG tablet Take 1 tablet (5 mg total) by mouth in the morning. Follow-up appt due in August must see MD for refills 90 tablet 1   methylPREDNISolone (MEDROL DOSEPAK) 4 MG TBPK tablet As directed 21 tablet 0   benzonatate (TESSALON) 100 MG capsule Take 1 capsule (100 mg total) by mouth 3 (three) times daily as needed. (Patient not taking: Reported on 10/29/2022) 30 capsule 0   No facility-administered medications prior to visit.    ROS: Review of Systems  Constitutional:  Negative for appetite change,  fatigue and unexpected weight change.  HENT:  Positive for hearing loss. Negative for congestion, nosebleeds, sneezing, sore throat and trouble swallowing.   Eyes:  Negative for itching and visual disturbance.  Respiratory:  Negative for cough.   Cardiovascular:  Negative for chest pain, palpitations and leg swelling.  Gastrointestinal:  Negative for abdominal distention, blood in stool, diarrhea and nausea.  Genitourinary:  Negative for frequency and hematuria.  Musculoskeletal:  Negative for back pain, gait problem, joint swelling and neck pain.  Skin:  Negative for rash.  Neurological:  Negative for dizziness, tremors, speech difficulty and weakness.  Psychiatric/Behavioral:  Negative for agitation, dysphoric mood, sleep disturbance and suicidal ideas. The patient is not nervous/anxious.     Objective:  BP 120/70 (BP Location: Left Arm, Patient Position: Sitting, Cuff Size: Normal)   Pulse (!) 55   Temp 98.2 F (36.8 C) (Oral)   Ht 5\' 9"  (1.753 m)   Wt 178 lb (80.7 kg)   SpO2 97%   BMI 26.29 kg/m   BP Readings from Last 3 Encounters:  05/07/23 120/70  10/29/22 100/68  08/15/22 128/82    Wt Readings from Last 3 Encounters:  05/07/23 178 lb (80.7 kg)  10/29/22 179 lb (81.2 kg)  08/15/22 182 lb (82.6 kg)    Physical Exam Constitutional:      General: He is not in acute distress.    Appearance: Normal appearance. He is  well-developed.     Comments: NAD  Eyes:     Conjunctiva/sclera: Conjunctivae normal.     Pupils: Pupils are equal, round, and reactive to light.  Neck:     Thyroid: No thyromegaly.     Vascular: No JVD.  Cardiovascular:     Rate and Rhythm: Normal rate and regular rhythm.     Heart sounds: Normal heart sounds. No murmur heard.    No friction rub. No gallop.  Pulmonary:     Effort: Pulmonary effort is normal. No respiratory distress.     Breath sounds: Normal breath sounds. No wheezing or rales.  Chest:     Chest wall: No tenderness.  Abdominal:      General: Bowel sounds are normal. There is no distension.     Palpations: Abdomen is soft. There is no mass.     Tenderness: There is no abdominal tenderness. There is no guarding or rebound.  Musculoskeletal:        General: No tenderness. Normal range of motion.     Cervical back: Normal range of motion.  Lymphadenopathy:     Cervical: No cervical adenopathy.  Skin:    General: Skin is warm and dry.     Findings: No rash.  Neurological:     Mental Status: He is alert and oriented to person, place, and time.     Cranial Nerves: No cranial nerve deficit.     Motor: No abnormal muscle tone.     Coordination: Coordination normal.     Gait: Gait normal.     Deep Tendon Reflexes: Reflexes are normal and symmetric.  Psychiatric:        Behavior: Behavior normal.        Thought Content: Thought content normal.        Judgment: Judgment normal.     Lab Results  Component Value Date   WBC 9.7 10/29/2022   HGB 13.2 10/29/2022   HCT 39.7 10/29/2022   PLT 229.0 10/29/2022   GLUCOSE 84 10/29/2022   CHOL 190 04/02/2022   TRIG 113.0 04/02/2022   HDL 53.30 04/02/2022   LDLCALC 114 (H) 04/02/2022   ALT 10 10/29/2022   AST 18 10/29/2022   NA 139 10/29/2022   K 4.2 10/29/2022   CL 104 10/29/2022   CREATININE 1.09 10/29/2022   BUN 24 (H) 10/29/2022   CO2 26 10/29/2022   TSH 1.79 10/29/2022   PSA 0.25 04/02/2022   INR 1.28 02/03/2016   HGBA1C 5.6 02/16/2016    US Renal  Result Date: 01/28/2017 CLINICAL DATA:  Recent UTI EXAM: RENAL / URINARY TRACT ULTRASOUND COMPLETE COMPARISON:  None. FINDINGS: Right Kidney: Length: 10 cm. Echogenicity within normal limits. No mass or hydronephrosis visualized. Left Kidney: Length: 10.9 cm. Echogenicity within normal limits. No mass or hydronephrosis visualized. Bladder: Appears normal for degree of bladder distention. Prostate is prominent indenting upon the inferior bladder. IMPRESSION: No acute abnormality noted.  Prominent prostate is seen.  Electronically Signed   By: Alcide Clever M.D.   On: 01/28/2017 14:18    Assessment & Plan:   Problem List Items Addressed This Visit     Depression    Worse Start Lexapro - q am      Relevant Medications   escitalopram (LEXAPRO) 5 MG tablet   Other Relevant Orders   CBC with Differential/Platelet   Comprehensive metabolic panel   TSH   Bee sting allergy    Epi-pen See other meds      Dyslipidemia  Labs      Relevant Orders   CBC with Differential/Platelet   Comprehensive metabolic panel   TSH   Chronic atrial fibrillation (HCC) - Primary    On Eliquis      Relevant Medications   apixaban (ELIQUIS) 2.5 MG TABS tablet   EPINEPHrine (EPIPEN 2-PAK) 0.3 mg/0.3 mL IJ SOAJ injection   Other Relevant Orders   CBC with Differential/Platelet   Comprehensive metabolic panel   TSH      Meds ordered this encounter  Medications   escitalopram (LEXAPRO) 5 MG tablet    Sig: Take 1 tablet (5 mg total) by mouth in the morning. Follow-up appt due in August must see MD for refills    Dispense:  90 tablet    Refill:  1   apixaban (ELIQUIS) 2.5 MG TABS tablet    Sig: Take 1 tablet (2.5 mg total) by mouth 2 (two) times daily.    Dispense:  180 tablet    Refill:  3   methylPREDNISolone (MEDROL DOSEPAK) 4 MG TBPK tablet    Sig: As directed    Dispense:  21 tablet    Refill:  0   EPINEPHrine (EPIPEN 2-PAK) 0.3 mg/0.3 mL IJ SOAJ injection    Sig: Inject 0.3 mg into the muscle as needed for anaphylaxis.    Dispense:  1 each    Refill:  1      Follow-up: Return in about 6 months (around 11/05/2023) for a follow-up visit.  Sonda Primes, MD

## 2023-05-07 NOTE — Assessment & Plan Note (Signed)
Epi-pen See other meds

## 2023-05-08 ENCOUNTER — Ambulatory Visit: Payer: Medicare Other | Attending: Cardiovascular Disease | Admitting: Cardiovascular Disease

## 2023-05-08 ENCOUNTER — Encounter: Payer: Self-pay | Admitting: Cardiovascular Disease

## 2023-05-08 VITALS — BP 162/74 | HR 52 | Ht 69.0 in | Wt 175.0 lb

## 2023-05-08 DIAGNOSIS — I25118 Atherosclerotic heart disease of native coronary artery with other forms of angina pectoris: Secondary | ICD-10-CM | POA: Diagnosis not present

## 2023-05-08 DIAGNOSIS — I739 Peripheral vascular disease, unspecified: Secondary | ICD-10-CM | POA: Diagnosis not present

## 2023-05-08 DIAGNOSIS — I4819 Other persistent atrial fibrillation: Secondary | ICD-10-CM | POA: Insufficient documentation

## 2023-05-08 DIAGNOSIS — Z952 Presence of prosthetic heart valve: Secondary | ICD-10-CM | POA: Insufficient documentation

## 2023-05-08 NOTE — Patient Instructions (Signed)
Medication Instructions:  Your physician recommends that you continue on your current medications as directed. Please refer to the Current Medication list given to you today.  *If you need a refill on your cardiac medications before your next appointment, please call your pharmacy*  Lab Work: If you have labs (blood work) drawn today and your tests are completely normal, you will receive your results only by: MyChart Message (if you have MyChart) OR A paper copy in the mail If you have any lab test that is abnormal or we need to change your treatment, we will call you to review the results.  Testing/Procedures: None ordered today.  Follow-Up: At [facility], you and your health needs are our priority.  As part of our continuing mission to provide you with exceptional heart care, we have created designated Provider Care Teams.  These Care Teams include your primary Cardiologist (physician) and Advanced Practice Providers (APPs -  Physician Assistants and Nurse Practitioners) who all work together to provide you with the care you need, when you need it.  We recommend signing up for the patient portal called "MyChart".  Sign up information is provided on this After Visit Summary.  MyChart is used to connect with patients for Virtual Visits (Telemedicine).  Patients are able to view lab/test results, encounter notes, upcoming appointments, etc.  Non-urgent messages can be sent to your provider as well.   To learn more about what you can do with MyChart, go to [email or web address].    Your next appointment:   6 month(s)  Provider:   Peter Nishan, MD      

## 2023-06-07 ENCOUNTER — Other Ambulatory Visit: Payer: Self-pay | Admitting: Internal Medicine

## 2023-06-08 ENCOUNTER — Encounter: Payer: Self-pay | Admitting: Internal Medicine

## 2023-07-25 ENCOUNTER — Encounter: Payer: Self-pay | Admitting: Internal Medicine

## 2023-07-26 ENCOUNTER — Other Ambulatory Visit: Payer: Self-pay | Admitting: Internal Medicine

## 2023-07-26 MED ORDER — ESCITALOPRAM OXALATE 5 MG PO TABS: 5.0000 mg | ORAL_TABLET | Freq: Every morning | ORAL | 1 refills | Status: DC

## 2023-08-29 ENCOUNTER — Encounter: Payer: Self-pay | Admitting: Internal Medicine

## 2023-09-05 ENCOUNTER — Ambulatory Visit (INDEPENDENT_AMBULATORY_CARE_PROVIDER_SITE_OTHER): Payer: Medicare Other

## 2023-09-05 VITALS — BP 144/76 | HR 54 | Ht 69.0 in | Wt 180.0 lb

## 2023-09-05 DIAGNOSIS — Z0000 Encounter for general adult medical examination without abnormal findings: Secondary | ICD-10-CM

## 2023-09-05 NOTE — Progress Notes (Cosign Needed Addendum)
Subjective:   Jason Hawkins is a 89 y.o. male who presents for Medicare Annual/Subsequent preventive examination.  Visit Complete: In person  Patient Medicare AWV questionnaire was completed by the patient on 09/05/2023; I have confirmed that all information answered by patient is correct and no changes since this date.  Cardiac Risk Factors include: advanced age (>32men, >11 women);male gender;Other (see comment);hypertension, Risk factor comments: PVD, Hx of prostate cancer.     Objective:    Today's Vitals   09/04/22 1617  BP: (!) 144/76  Pulse: (!) 54  Weight: 180 lb (81.6 kg)  Height: 5\' 9"  (1.753 m)   Body mass index is 26.58 kg/m.     09/06/2023    4:21 PM 06/25/2022    2:20 PM 06/13/2021    1:48 PM 03/24/2020   12:55 PM 03/08/2016    3:10 PM 02/25/2016    8:00 AM 02/20/2016    6:44 PM  Advanced Directives  Does Patient Have a Medical Advance Directive? Yes Yes Yes Yes Yes Yes Yes  Type of Estate agent of [place];Living will Living will Living will;Healthcare Power of [employer] Power of [place];Living will Healthcare Power of [place];Living will Living will;Healthcare Power of Attorney Living will;Healthcare Power of Attorney  Does patient want to make changes to medical advance directive?   No - Patient declined No - Patient declined No - Patient declined  No - Patient declined  Copy of Healthcare Power of Attorney in Chart? No - copy requested  No - copy requested No - copy requested No - copy requested No - copy requested No - copy requested    Current Medications (verified) Outpatient Encounter Medications as of 09/05/2023  Medication Sig   albuterol (VENTOLIN HFA) 108 (90 Base) MCG/ACT inhaler Inhale 1-2 puffs into the lungs every 6 (six) hours as needed.   cetirizine (ZYRTEC) 10 MG tablet Take 1 tablet (10 mg total) by mouth daily.   Cholecalciferol (VITAMIN D3) 50 MCG (2000 UT) capsule Take 1 capsule (2,000 Units total) by mouth  daily.   ELIQUIS 2.5 MG TABS tablet TAKE 1 TABLET TWICE A DAY   EPINEPHrine (EPIPEN 2-PAK) 0.3 mg/0.3 mL IJ SOAJ injection Inject 0.3 mg into the muscle as needed for anaphylaxis.   escitalopram (LEXAPRO) 5 MG tablet Take 1 tablet (5 mg total) by mouth in the morning. Follow-up appt due in August must see MD for refills   hydrOXYzine (VISTARIL) 25 MG capsule TAKE 1 OR 2 CAPSULES BY MOUTH AT BEDTIME AS NEEDED   loratadine (CLARITIN) 10 MG tablet Take 1 tablet (10 mg total) by mouth daily.   methylPREDNISolone (MEDROL DOSEPAK) 4 MG TBPK tablet As directed   Multiple Vitamin (MULTIVITAMIN) capsule Take 1 capsule by mouth daily.   polyethylene glycol powder (GLYCOLAX/MIRALAX) 17 GM/SCOOP powder Take 17 g by mouth 2 (two) times daily as needed.   Spacer/Aero-Holding [place] Curt Use with inhaler   triamcinolone cream (KENALOG) 0.1 % Apply 1 Application topically 2 (two) times daily.   No facility-administered encounter medications on file as of 09/05/2023.    Allergies (verified) Bee venom, Codeine, and Lisinopril   History: Past Medical History:  Diagnosis Date   Bradycardia    a. prior to STEMI 02/2016 -> improved with stenting.   Brain aneurysm    a. s/p coiling 01/2016.   CAD (coronary artery disease)    a. STEMI 02/2016 while on rehab s/p DES to Cx, EF 60-65%.   Chronic atrial fibrillation (HCC)  HTN (hypertension)    Hypercholesteremia    Lower extremity edema    Severe aortic stenosis    a. s/p aortic valve replacement using a pericardial tissue valve   Subarachnoid bleed (HCC)    a. due to brain aneurysm 01/2016.   Past Surgical History:  Procedure Laterality Date   AORTIC VALVE REPLACEMENT  08/30/2003   Dr. Laneta Simmers   CARDIAC CATHETERIZATION N/A 02/16/2016   Procedure: Left Heart Cath and Coronary Angiography;  Surgeon: Peter M Swaziland, MD;  Location: [facility] INVASIVE CV LAB;  Service: Cardiovascular;  Laterality: N/A;   CARDIAC CATHETERIZATION N/A 02/16/2016   Procedure: Coronary  Stent Intervention;  Surgeon: Peter M Swaziland, MD;  Location: [facility] INVASIVE CV LAB;  Service: Cardiovascular;  Laterality: N/A;   IR GENERIC HISTORICAL  02/01/2016   IR TRANSCATH/EMBOLIZ 02/01/2016 [facility]INTER[facility] RAD   IR GENERIC HISTORICAL  02/01/2016   IR ANGIOGRAM FOLLOW UP STUDY 02/01/2016 [facility]   IR GENERIC HISTORICAL  02/01/2016   IR ANGIOGRAM FOLLOW UP STUDY 02/01/2016 [facility]   IR GENERIC HISTORICAL  02/01/2016   IR ANGIO INTRA EXTRACRAN SEL INTERNAL CAROTID BILAT MOD SED 02/01/2016 [facility]   IR GENERIC HISTORICAL  02/01/2016   IR NEURO EACH ADD'L AFTER BASIC UNI LEFT (MS) 02/01/2016 [facility]   RADIOLOGY WITH ANESTHESIA N/A 02/01/2016   Procedure: RADIOLOGY WITH ANESTHESIA;  Surgeon: Lisbeth Renshaw, MD;  Location: [facility] OR;  Service: Radiology;  Laterality: N/A;   Family History  Problem Relation Age of Onset   Diabetes Mother    Heart disease Father    Heart attack Father    Heart attack Unknown    Diabetes Unknown    Cancer Son 45       prostate ca   Social History   Socioeconomic History   Marital status: Widowed    Spouse name: Not on file   Number of children: Not on file   Years of education: Not on file   Highest education level: Associate degree: occupational, Scientist, product/process development, or vocational program  Occupational History   Not on file  Tobacco Use   Smoking status: Former   Smokeless tobacco: Never  Vaping Use   Vaping status: Never Used  Substance and Sexual Activity   Alcohol use: Yes    Alcohol/week: 14.0 standard drinks of alcohol    Types: 14 Cans of beer per week   Drug use: No   Sexual activity: Never  Other Topics Concern   Not on file  Social History Narrative   Not on file   Social Drivers of Health   Financial Resource Strain: Low Risk  (09/05/2023)   Overall Financial Resource Strain (CARDIA)    Difficulty of Paying Living Expenses: Not hard at all  Food Insecurity: No Food Insecurity (09/05/2023)   Hunger Vital Sign    Worried About  Running Out of Food in the Last Year: Never true    Ran Out of Food in the Last Year: Never true  Transportation Needs: No Transportation Needs (09/05/2023)   PRAPARE - Administrator, Civil Service (Medical): No    Lack of Transportation (Non-Medical): No  Physical Activity: Unknown (09/05/2023)   Exercise Vital Sign    Days of Exercise per Week: 0 days    Minutes of Exercise per Session: Not on file  Stress: Stress Concern Present (09/05/2023)   [employer] of Occupational Health - Occupational Stress Questionnaire    Feeling of Stress : To some extent  Social Connections: Moderately  Isolated (09/05/2023)   Social Connection and Isolation Panel [NHANES]    Frequency of Communication with Friends and Family: More than three times a week    Frequency of Social Gatherings with Friends and Family: More than three times a week    Attends Religious Services: 1 to 4 times per year    Active Member of [employer] or Organizations: No    Attends Banker Meetings: Not on file    Marital Status: Widowed    Tobacco Counseling Counseling given: Not Answered   Clinical Intake:  Pre-visit preparation completed: Yes  Pain : 0-10 Pain Location: Shoulder Pain Orientation:  (bilateral) Pain Descriptors / Indicators: Aching, Discomfort, Dull Pain Onset: More than a month ago Pain Frequency: Constant     Nutritional Risks: None  How often do you need to have someone help you when you read instructions, pamphlets, or other written materials from your doctor or pharmacy?: 4 - Often  Interpreter Needed?: No  Information entered by :: Jezlyn Westerfield, CMA   Activities of Daily Living    09/05/2023   11:24 AM  In your present state of health, do you have any difficulty performing the following activities:  Hearing? 1  Vision? 1  Difficulty concentrating or making decisions? 1  Walking or climbing stairs? 1  Dressing or bathing? 1  Doing errands, shopping? 1   Preparing Food and eating ? Y  Using the Toilet? Y  In the past six months, have you accidently leaked urine? Y  Do you have problems with loss of bowel control? Y  Managing your Medications? Y  Managing your Finances? Y  Housekeeping or managing your Housekeeping? Y    Patient Care Team: Plotnikov, Georgina Quint, MD as PCP - General (Internal Medicine) Wendall Stade, MD as PCP - Cardiology (Cardiology) Wendall Stade, MD as Consulting Physician (Cardiology) Ander Purpura, OD as Consulting Physician (Optometry)  Indicate any recent Medical Services you may have received from other than [facility] providers in the past year (date may be approximate).     Assessment:   This is a routine wellness examination for [employer].  Hearing/Vision screen Hearing Screening - Comments:: Wears hearing aides Vision Screening - Comments:: Wears eyeglasses   Goals Addressed   None   Depression Screen    09/06/2023    4:21 PM 05/07/2023    2:35 PM 10/29/2022    1:06 PM 07/11/2022    1:20 PM 06/25/2022    2:27 PM 06/25/2022    1:31 PM 06/13/2021    2:04 PM  PHQ 2/9 Scores  PHQ - 2 Score 1 0 0 0 0 0 0  PHQ- 9 Score 2 0  0  3     Fall Risk    09/05/2023   11:24 AM 05/07/2023    2:35 PM 10/29/2022    1:06 PM 07/11/2022    1:20 PM 06/25/2022    1:32 PM  Fall Risk   Falls in the past year? 1 1 0 0 0  Number falls in past yr: 0 0 0 0 0  Injury with Fall? 0 0 0 0 0  Risk for fall due to :  History of fall(s);Impaired balance/gait No Fall Risks No Fall Risks No Fall Risks  Follow up Falls evaluation completed;Falls prevention discussed Falls evaluation completed Falls evaluation completed Falls evaluation completed Falls evaluation completed    MEDICARE RISK AT HOME: Medicare Risk at Home Any stairs in or around the home?: (Patient-Rptd) Yes If so, are  there any without handrails?: (Patient-Rptd) No Home free of loose throw rugs in walkways, pet beds, electrical cords, etc?: (Patient-Rptd) No Adequate  lighting in your home to reduce risk of falls?: (Patient-Rptd) Yes Life alert?: (Patient-Rptd) No Use of a cane, walker or w/c?: (Patient-Rptd) Yes Grab bars in the bathroom?: (Patient-Rptd) Yes Shower chair or bench in shower?: (Patient-Rptd) Yes Elevated toilet seat or a handicapped toilet?: (Patient-Rptd) Yes  TIMED UP AND GO:  Was the test performed?  Yes  Length of time to ambulate 10 feet: 50 sec Gait slow and steady with assistive device    Cognitive Function:        09/05/2023    2:46 PM 06/25/2022    2:34 PM  6CIT Screen  What Year? 0 points 0 points  What month? 0 points 0 points  What time? 0 points 3 points  Count back from 20 0 points 4 points  Months in reverse 0 points --  Repeat phrase 0 points --  Total Score 0 points     Immunizations Immunization History  Administered Date(s) Administered   Fluad Quad(high Dose 65+) 05/28/2022   Influenza-Unspecified 05/06/2014   Moderna Sars-Covid-2 Vaccination 10/22/2019, 11/19/2019, 06/07/2020   PFIZER Comirnaty(Gray Top)Covid-19 Tri-Sucrose Vaccine 12/16/2020   PNEUMOCOCCAL CONJUGATE-20 04/06/2021   Pfizer Covid-19 Vaccine Bivalent Booster 65yrs & up 06/05/2021   Pfizer(Comirnaty)Fall Seasonal Vaccine 12 years and older 08/08/2022   Pneumococcal Polysaccharide-23 03/17/2013   Td 03/17/2008   Tdap 03/26/2019   Zoster, Live 05/12/2013    TDAP status: Up to date  Flu Vaccine status: Due, Education has been provided regarding the importance of this vaccine. Advised may receive this vaccine at local pharmacy or Health Dept. Aware to provide a copy of the vaccination record if obtained from local pharmacy or Health Dept. Verbalized acceptance and understanding.  Pneumococcal vaccine status: Up to date  Covid-19 vaccine status: Completed vaccines  Qualifies for Shingles Vaccine? Yes   Zostavax completed Yes   Shingrix Completed?: No.    Education has been provided regarding the importance of this vaccine. Patient  has been advised to call insurance company to determine out of pocket expense if they have not yet received this vaccine. Advised may also receive vaccine at local pharmacy or Health Dept. Verbalized acceptance and understanding.  Screening Tests Health Maintenance  Topic Date Due   COVID-19 Vaccine (7 - 2024-25 season) 04/07/2023   INFLUENZA VACCINE  11/04/2023 (Originally 03/07/2023)   Medicare Annual Wellness (AWV)  09/04/2024   DTaP/Tdap/Td (3 - Td or Tdap) 03/25/2029   Pneumonia Vaccine 98+ Years old  Completed   HPV VACCINES  Aged Out   Zoster Vaccines- Shingrix  Discontinued    Health Maintenance  Health Maintenance Due  Topic Date Due   COVID-19 Vaccine (7 - 2024-25 season) 04/07/2023    Colorectal cancer screening: No longer required.   Lung Cancer Screening: (Low Dose CT Chest recommended if Age 8-80 years, 20 pack-year currently smoking OR have quit w/in 15years.) does not qualify.   Lung Cancer Screening Referral: N/A  Additional Screening:  Hepatitis C Screening: does not qualify;  Vision Screening: Recommended annual ophthalmology exams for early detection of glaucoma and other disorders of the eye. Is the patient up to date with their annual eye exam?  Yes  Who is the provider or what is the name of the office in which the patient attends annual eye exams? Dr. Yetta [facility]re [facility]) If pt is not established with a provider, would they like to  be referred to a provider to establish care? No .   Dental Screening: Recommended annual dental exams for proper oral hygiene   Community Resource Referral / Chronic Care Management: CRR required this visit?  No   CCM required this visit?  No     Plan:     I have personally reviewed and noted the following in the patient's chart:   Medical and social history Use of alcohol, tobacco or illicit drugs  Current medications and supplements including opioid prescriptions. Patient is not currently taking opioid  prescriptions. Functional ability and status Nutritional status Physical activity Advanced directives List of other physicians Hospitalizations, surgeries, and ER visits in previous 12 months Vitals Screenings to include cognitive, depression, and falls Referrals and appointments  In addition, I have reviewed and discussed with patient certain preventive protocols, quality metrics, and best practice recommendations. A written personalized care plan for preventive services as well as general preventive health recommendations were provided to patient.     Keniyah Gelinas L Najmah Carradine, CMA   09/06/2023   After Visit Summary: (MyChart) Due to this being a telephonic visit, the after visit summary with patients personalized plan was offered to patient via MyChart   Nurse Notes: Patient is up to date on health maintenance and had no concerns to address today.   Medical screening examination/treatment/procedure(s) were performed by non-physician practitioner and as supervising physician I was immediately available for consultation/collaboration.  I agree with above. Jacinta Shoe, MD

## 2023-09-06 NOTE — Patient Instructions (Signed)
Jason Hawkins , Thank you for taking time to come for your Medicare Wellness Visit. I appreciate your ongoing commitment to your health goals. Please review the following plan we discussed and let me know if I can assist you in the future.   Referrals/Orders/Follow-Ups/Clinician Recommendations: Keep up the good work.  This is a list of the screening recommended for you and due dates:  Health Maintenance  Topic Date Due   COVID-19 Vaccine (7 - 2024-25 season) 04/07/2023   Flu Shot  11/04/2023*   Medicare Annual Wellness Visit  09/04/2024   DTaP/Tdap/Td vaccine (3 - Td or Tdap) 03/25/2029   Pneumonia Vaccine  Completed   HPV Vaccine  Aged Out   Zoster (Shingles) Vaccine  Discontinued  *Topic was postponed. The date shown is not the original due date.    Advanced directives: (Copy Requested) Please bring a copy of your health care power of attorney and living will to the office to be added to your chart at your convenience.  Next Medicare Annual Wellness Visit scheduled for next year: Yes

## 2023-09-12 ENCOUNTER — Encounter: Payer: Self-pay | Admitting: Internal Medicine

## 2023-09-12 ENCOUNTER — Other Ambulatory Visit: Payer: Self-pay | Admitting: Internal Medicine

## 2023-09-12 ENCOUNTER — Ambulatory Visit: Payer: Medicare Other | Admitting: Internal Medicine

## 2023-09-12 VITALS — BP 110/70 | HR 80 | Temp 97.9°F | Ht 69.0 in | Wt 179.0 lb

## 2023-09-12 DIAGNOSIS — Z7189 Other specified counseling: Secondary | ICD-10-CM | POA: Diagnosis not present

## 2023-09-12 DIAGNOSIS — H60502 Unspecified acute noninfective otitis externa, left ear: Secondary | ICD-10-CM

## 2023-09-12 DIAGNOSIS — Z23 Encounter for immunization: Secondary | ICD-10-CM | POA: Diagnosis not present

## 2023-09-12 DIAGNOSIS — H6121 Impacted cerumen, right ear: Secondary | ICD-10-CM | POA: Diagnosis not present

## 2023-09-12 MED ORDER — NEOMYCIN-POLYMYXIN-HC 3.5-10000-1 OT SOLN
3.0000 [drp] | Freq: Three times a day (TID) | OTIC | 0 refills | Status: AC
Start: 2023-09-12 — End: 2023-09-15

## 2023-09-12 NOTE — Progress Notes (Signed)
 Subjective:  Patient ID: Jason Hawkins, male    DOB: Jul 15, 1928  Age: 89 y.o. MRN: [id number]  CC: Cerumen Impaction (Pt seen audiologist and was told he he had a large amount of wax in his rt ear that needed to be removed before she will do a hearing test for the pt to get new hearing aids. )   HPI Jason Hawkins presents for ear wax, hearing loss We need to discuss end of life decisions  He is here w/[name] - his dtr in law  Outpatient Medications Prior to Visit  Medication Sig Dispense Refill   albuterol  (VENTOLIN  HFA) 108 (90 Base) MCG/ACT inhaler Inhale 1-2 puffs into the lungs every 6 (six) hours as needed. 8 g 0   cetirizine  (ZYRTEC ) 10 MG tablet Take 1 tablet (10 mg total) by mouth daily. 90 tablet 3   Cholecalciferol (VITAMIN D3) 50 MCG (2000 UT) capsule Take 1 capsule (2,000 Units total) by mouth daily. 100 capsule 3   ELIQUIS  2.5 MG TABS tablet TAKE 1 TABLET TWICE A DAY 180 tablet 3   EPINEPHrine  (EPIPEN  2-PAK) 0.3 mg/0.3 mL IJ SOAJ injection Inject 0.3 mg into the muscle as needed for anaphylaxis. 1 each 1   hydrOXYzine  (VISTARIL ) 25 MG capsule TAKE 1 OR 2 CAPSULES BY MOUTH AT BEDTIME AS NEEDED 180 capsule 1   loratadine  (CLARITIN ) 10 MG tablet Take 1 tablet (10 mg total) by mouth daily. 30 tablet 3   methylPREDNISolone  (MEDROL  DOSEPAK) 4 MG TBPK tablet As directed 21 tablet 0   Multiple Vitamin (MULTIVITAMIN) capsule Take 1 capsule by mouth daily.     polyethylene glycol powder (GLYCOLAX /MIRALAX ) 17 GM/SCOOP powder Take 17 g by mouth 2 (two) times daily as needed. 500 g 3   Spacer/Aero-Holding Chambers DEVI Use with inhaler 1 each 0   triamcinolone  cream (KENALOG ) 0.1 % Apply 1 Application topically 2 (two) times daily. 450 g 1   escitalopram  (LEXAPRO ) 5 MG tablet Take 1 tablet (5 mg total) by mouth in the morning. Follow-up appt due in August must see MD for refills 90 tablet 1   No facility-administered medications prior to visit.    ROS: Review of Systems   Constitutional:  Positive for fatigue. Negative for unexpected weight change.  HENT:  Positive for hearing loss. Negative for ear discharge, ear pain and rhinorrhea.   Musculoskeletal:  Positive for gait problem. Negative for arthralgias and back pain.  Psychiatric/Behavioral:  Positive for decreased concentration and dysphoric mood. Negative for confusion and suicidal ideas.     Objective:  BP 110/70 (BP Location: Left Arm, Patient Position: Sitting, Cuff Size: Normal)   Pulse 80   Temp 97.9 F (36.6 C) (Oral)   Ht 5' 9 (1.753 m)   Wt 179 lb (81.2 kg)   SpO2 98%   BMI 26.43 kg/m   BP Readings from Last 3 Encounters:  09/12/23 110/70  09/04/22 (!) 144/76  05/08/23 (!) 162/74    Wt Readings from Last 3 Encounters:  09/12/23 179 lb (81.2 kg)  09/04/22 180 lb (81.6 kg)  05/08/23 175 lb (79.4 kg)    Physical Exam Constitutional:      General: He is not in acute distress.    Appearance: Normal appearance. He is well-developed.     Comments: NAD  HENT:     Right Ear: There is impacted cerumen.     Left Ear: There is impacted cerumen.  Eyes:     Conjunctiva/sclera: Conjunctivae normal.     Pupils: Pupils  are equal, round, and reactive to light.  Neck:     Thyroid : No thyromegaly.     Vascular: No JVD.  Cardiovascular:     Rate and Rhythm: Normal rate and regular rhythm.     Heart sounds: Normal heart sounds. No murmur heard.    No friction rub. No gallop.  Pulmonary:     Effort: Pulmonary effort is normal. No respiratory distress.     Breath sounds: Normal breath sounds. No wheezing or rales.  Chest:     Chest wall: No tenderness.  Abdominal:     General: Bowel sounds are normal. There is no distension.     Palpations: Abdomen is soft. There is no mass.     Tenderness: There is no abdominal tenderness. There is no guarding or rebound.  Musculoskeletal:        General: No tenderness. Normal range of motion.     Cervical back: Normal range of motion.   Lymphadenopathy:     Cervical: No cervical adenopathy.  Skin:    General: Skin is warm and dry.     Findings: No rash.  Neurological:     Mental Status: He is alert and oriented to person, place, and time.     Cranial Nerves: No cranial nerve deficit.     Motor: No abnormal muscle tone.     Coordination: Coordination normal.     Gait: Gait abnormal.     Deep Tendon Reflexes: Reflexes are normal and symmetric.  Psychiatric:        Mood and Affect: Mood normal.        Behavior: Behavior normal.        Thought Content: Thought content normal.        Judgment: Judgment normal.       Procedure Note :     Procedure :  Ear irrigation right and left ears   Indication:  Cerumen impaction right and left ears   Risks, including pain, dizziness, eardrum perforation, bleeding, infection and others as well as benefits were explained to the patient in detail. Verbal consent was obtained and the patient agreed to proceed.    We used The Elephant Ear Irrigation Device filled with lukewarm water for irrigation. A large amount wax was recovered from both ears. Procedure has also required manual wax removal/instrumentation with an ear wax curette and ear forceps on the right and left ears.   Tolerated well. Complications: None.   Postprocedure instructions :  Call if problems.  Lab Results  Component Value Date   WBC 9.7 10/29/2022   HGB 13.2 10/29/2022   HCT 39.7 10/29/2022   PLT 229.0 10/29/2022   GLUCOSE 84 10/29/2022   CHOL 190 04/02/2022   TRIG 113.0 04/02/2022   HDL 53.30 04/02/2022   LDLCALC 114 (H) 04/02/2022   ALT 10 10/29/2022   AST 18 10/29/2022   NA 139 10/29/2022   K 4.2 10/29/2022   CL 104 10/29/2022   CREATININE 1.09 10/29/2022   BUN 24 (H) 10/29/2022   CO2 26 10/29/2022   TSH 1.79 10/29/2022   PSA 0.25 04/02/2022   INR 1.28 02/03/2016   HGBA1C 5.6 02/16/2016    US  Renal Result Date: 01/28/2017 CLINICAL DATA:  Recent UTI EXAM: RENAL / URINARY TRACT  ULTRASOUND COMPLETE COMPARISON:  None. FINDINGS: Right Kidney: Length: 10 cm. Echogenicity within normal limits. No mass or hydronephrosis visualized. Left Kidney: Length: 10.9 cm. Echogenicity within normal limits. No mass or hydronephrosis visualized. Bladder: Appears normal for degree of  bladder distention. Prostate is prominent indenting upon the inferior bladder. IMPRESSION: No acute abnormality noted.  Prominent prostate is seen. Electronically Signed   By: Oneil Devonshire M.D.   On: 01/28/2017 14:18    Assessment & Plan:   Problem List Items Addressed This Visit     DNR (do not resuscitate) discussion   DNR status was confirmed Pt refused all vaccines      Otitis externa    If sx's after irrigation: Rx - Cortisporin otic gtt      Other Visit Diagnoses       Impacted cerumen of right ear    -  Primary   Relevant Orders   Ear Lavage     Need for influenza vaccination       Relevant Orders   Flu Vaccine Trivalent High Dose (Fluad) (Completed)         Meds ordered this encounter  Medications   neomycin -polymyxin-hydrocortisone (CORTISPORIN) OTIC solution    Sig: Place 3 drops into both ears 3 (three) times daily for 3 days.    Dispense:  10 mL    Refill:  0      Follow-up: Return for a follow-up visit.  Marolyn Noel, MD

## 2023-09-12 NOTE — Assessment & Plan Note (Addendum)
DNR status was confirmed Pt refused all vaccines

## 2023-09-12 NOTE — Progress Notes (Signed)
 PRE-PROCEDURE EXAM: Rt TM cannot be visualized due to total occlusion/impaction of the ear canal.  PROCEDURE INDICATION: remove wax to visualize ear drum & relieve discomfort  CONSENT:  Verbal   PROCEDURE NOTE:  RIGHT EAR:  The CMA irrigated the ear canal with warm water to remove the wax.  POST- PROCEDURE EXAM: RT TM successfully visualized and found to have no erythema.  Right ear canal with scant remaining cerumen.  Patient tolerated the procedure well and had relief of their symptoms after the procedure.

## 2023-09-20 NOTE — Assessment & Plan Note (Signed)
  If sx's after irrigation: Rx - Cortisporin otic gtt

## 2023-10-07 NOTE — Progress Notes (Deleted)
 Patient ID: Jason Hawkins, Hawkins   DOB: [birth year], 89 y.o.   MRN: [id number]    89 y.o. history of AVR and afib.  Had [facility] with coil embolization of artery on 02/01/16. Complicated by STEMI while in rehab. Coumadin changed to eliquis.  Cath with small 80% lesion in OM and occluded distal circumflex that was stented Last echo in July 2017 with EF 60-65% normal bioprosthetic valve with mean gradient 8 peak 17 mmHg   He still has a fatalistic attitude and does not really want to take any meds. When seen by NP 01/23/21 was only taking eliquis once/day BP elevated and started on Losartan 25 mg daily   Tested positive for COVID 04/17/21 RX with molnupiravir  He continues to wish he had died when his wife of 61 years past  Daughter in Social worker Jason Hawkins looks in on him daily  Says he cut back his beer intake Likes to feed the birds Memory getting worse with poor hearing   Seen by primary 06/25/22 for diffuse rash Losartan d/c ? Why. Rx with depomedrol, Hydroxyzine , Loratidine and triamcinolone  CXR NAD WBC normal but had elevated relative eosinophil count 7.3%  Rash is gone Drinking a bit less ? If rash was Pemphigoid  Daughter still has issues getting him up and about   ***   ROS: Denies fever, malais, weight loss, blurry vision, decreased visual acuity, cough, sputum, SOB, hemoptysis, pleuritic pain, palpitaitons, heartburn, abdominal pain, melena, lower extremity edema, claudication, or rash.  All other systems reviewed and negative  General: Affect appropriate Jason Hawkins  HEENT: normal Neck supple with no adenopathy JVP normal no bruits no thyromegaly Lungs clear with no wheezing and good diaphragmatic motion Heart:  S1/S2 click SEM  murmur, no rub, gallop or click PMI normal post sternotomy  Abdomen: benighn, BS positve, no tenderness, no AAA no bruit.  No HSM or HJR Distal pulses intact with no bruits Plus 2 bilateral  Edema with stasis  Skin:  improved rash on chest     Current Outpatient Medications  Medication Sig Dispense Refill   albuterol (VENTOLIN HFA) 108 (90 Base) MCG/ACT inhaler Inhale 1-2 puffs into the lungs every 6 (six) hours as needed. 8 g 0   cetirizine (ZYRTEC) 10 MG tablet Take 1 tablet (10 mg total) by mouth daily. 90 tablet 3   Cholecalciferol (VITAMIN D3) 50 MCG (2000 UT) capsule Take 1 capsule (2,000 Units total) by mouth daily. 100 capsule 3   ELIQUIS 2.5 MG TABS tablet TAKE 1 TABLET TWICE A DAY 180 tablet 3   EPINEPHrine (EPIPEN 2-PAK) 0.3 mg/0.3 mL IJ SOAJ injection Inject 0.3 mg into the muscle as needed for anaphylaxis. 1 each 1   escitalopram (LEXAPRO) 5 MG tablet TAKE 1 TABLET IN THE MORNING (FOLLOW UP APPOINTMENT DUE IN AUGUST , MUST SEE MD FOR REFILLS) 90 tablet 3   hydrOXYzine (VISTARIL) 25 MG capsule TAKE 1 OR 2 CAPSULES BY MOUTH AT BEDTIME AS NEEDED 180 capsule 1   loratadine (CLARITIN) 10 MG tablet Take 1 tablet (10 mg total) by mouth daily. 30 tablet 3   methylPREDNISolone (MEDROL DOSEPAK) 4 MG TBPK tablet As directed 21 tablet 0   Multiple Vitamin (MULTIVITAMIN) capsule Take 1 capsule by mouth daily.     polyethylene glycol powder (GLYCOLAX/MIRALAX) 17 GM/SCOOP powder Take 17 g by mouth 2 (two) times daily as needed. 500 g 3   Spacer/Aero-Holding Chambers DEVI Use with inhaler 1 each 0   triamcinolone cream (KENALOG) 0.1 %  Apply 1 Application topically 2 (two) times daily. 450 g 1   No current facility-administered medications for this visit.    Allergies  Bee venom, Codeine, and Lisinopril  Electrocardiogram:    Afib rate 61 LAD no acute changes   Assessment and Plan AVR: normal valve clicks SBE  No echo needed  Edema  Venous disease refuses to take diuretic. Discussed elevation and low sodium diet  PAF:  ECG with afib today rate fine on eliquis for stroke prophylaxis but only taking low dose daily He promised to take bid  HLD Previously on statin diet Rx only at this point/age  CAD:  Stable no angina  Stent  to distal circumflex 02/16/16  SAH:  No residual neurologic deficits. Fortunate coiled aneurysm so hopefully risk of recurrence On blood thinners will be less  HTN:  started on losartan 01/23/21 improved  COVID:  Diagnosed 04/17/21 Rx with molnupiravir improved  Rash:  diffuse upper body with pruritus and elevated relative eosinophil count ARB d/c Rx as above   Depression:  started on lexapro by primary. Has not been same for a long time since his wife died   No testing   F/U in 6 months    Jason Hawkins

## 2023-10-14 ENCOUNTER — Ambulatory Visit: Payer: Medicare Other | Admitting: Cardiovascular Disease

## 2024-01-03 ENCOUNTER — Encounter: Payer: Self-pay | Admitting: Internal Medicine

## 2024-01-10 ENCOUNTER — Other Ambulatory Visit: Payer: Self-pay | Admitting: Internal Medicine

## 2024-01-10 DIAGNOSIS — R413 Other amnesia: Secondary | ICD-10-CM

## 2024-01-10 DIAGNOSIS — R269 Unspecified abnormalities of gait and mobility: Secondary | ICD-10-CM

## 2024-01-10 DIAGNOSIS — R5382 Chronic fatigue, unspecified: Secondary | ICD-10-CM

## 2024-01-29 DIAGNOSIS — R269 Unspecified abnormalities of gait and mobility: Secondary | ICD-10-CM | POA: Diagnosis not present

## 2024-01-29 DIAGNOSIS — Z602 Problems related to living alone: Secondary | ICD-10-CM | POA: Diagnosis not present

## 2024-01-29 DIAGNOSIS — G8929 Other chronic pain: Secondary | ICD-10-CM | POA: Diagnosis not present

## 2024-01-29 DIAGNOSIS — I69315 Cognitive social or emotional deficit following cerebral infarction: Secondary | ICD-10-CM | POA: Diagnosis not present

## 2024-01-29 DIAGNOSIS — G441 Vascular headache, not elsewhere classified: Secondary | ICD-10-CM | POA: Diagnosis not present

## 2024-01-29 DIAGNOSIS — Z955 Presence of coronary angioplasty implant and graft: Secondary | ICD-10-CM | POA: Diagnosis not present

## 2024-01-29 DIAGNOSIS — Z91148 Patient's other noncompliance with medication regimen for other reason: Secondary | ICD-10-CM | POA: Diagnosis not present

## 2024-01-29 DIAGNOSIS — Z66 Do not resuscitate: Secondary | ICD-10-CM | POA: Diagnosis not present

## 2024-01-29 DIAGNOSIS — I482 Chronic atrial fibrillation, unspecified: Secondary | ICD-10-CM | POA: Diagnosis not present

## 2024-01-29 DIAGNOSIS — Z5982 Transportation insecurity: Secondary | ICD-10-CM | POA: Diagnosis not present

## 2024-01-29 DIAGNOSIS — Z7901 Long term (current) use of anticoagulants: Secondary | ICD-10-CM | POA: Diagnosis not present

## 2024-01-29 DIAGNOSIS — I739 Peripheral vascular disease, unspecified: Secondary | ICD-10-CM | POA: Diagnosis not present

## 2024-01-29 DIAGNOSIS — R5382 Chronic fatigue, unspecified: Secondary | ICD-10-CM | POA: Diagnosis not present

## 2024-01-29 DIAGNOSIS — R413 Other amnesia: Secondary | ICD-10-CM | POA: Diagnosis not present

## 2024-01-29 DIAGNOSIS — M25551 Pain in right hip: Secondary | ICD-10-CM | POA: Diagnosis not present

## 2024-01-29 DIAGNOSIS — L299 Pruritus, unspecified: Secondary | ICD-10-CM | POA: Diagnosis not present

## 2024-01-29 DIAGNOSIS — E785 Hyperlipidemia, unspecified: Secondary | ICD-10-CM | POA: Diagnosis not present

## 2024-01-29 DIAGNOSIS — Z952 Presence of prosthetic heart valve: Secondary | ICD-10-CM | POA: Diagnosis not present

## 2024-01-29 DIAGNOSIS — H6090 Unspecified otitis externa, unspecified ear: Secondary | ICD-10-CM | POA: Diagnosis not present

## 2024-01-29 DIAGNOSIS — U099 Post covid-19 condition, unspecified: Secondary | ICD-10-CM | POA: Diagnosis not present

## 2024-01-29 DIAGNOSIS — I872 Venous insufficiency (chronic) (peripheral): Secondary | ICD-10-CM | POA: Diagnosis not present

## 2024-01-29 DIAGNOSIS — R319 Hematuria, unspecified: Secondary | ICD-10-CM | POA: Diagnosis not present

## 2024-01-29 DIAGNOSIS — I10 Essential (primary) hypertension: Secondary | ICD-10-CM | POA: Diagnosis not present

## 2024-01-29 DIAGNOSIS — F32A Depression, unspecified: Secondary | ICD-10-CM | POA: Diagnosis not present

## 2024-01-29 DIAGNOSIS — H6690 Otitis media, unspecified, unspecified ear: Secondary | ICD-10-CM | POA: Diagnosis not present

## 2024-01-31 ENCOUNTER — Telehealth: Payer: Self-pay

## 2024-01-31 DIAGNOSIS — R5382 Chronic fatigue, unspecified: Secondary | ICD-10-CM | POA: Diagnosis not present

## 2024-01-31 DIAGNOSIS — I69315 Cognitive social or emotional deficit following cerebral infarction: Secondary | ICD-10-CM | POA: Diagnosis not present

## 2024-01-31 DIAGNOSIS — I10 Essential (primary) hypertension: Secondary | ICD-10-CM | POA: Diagnosis not present

## 2024-01-31 DIAGNOSIS — L299 Pruritus, unspecified: Secondary | ICD-10-CM | POA: Diagnosis not present

## 2024-01-31 DIAGNOSIS — I482 Chronic atrial fibrillation, unspecified: Secondary | ICD-10-CM | POA: Diagnosis not present

## 2024-01-31 DIAGNOSIS — U099 Post covid-19 condition, unspecified: Secondary | ICD-10-CM | POA: Diagnosis not present

## 2024-01-31 NOTE — Telephone Encounter (Unsigned)
 Copied from CRM [id number]. Topic: Clinical - Home Health Verbal Orders >> Jan 31, 2024  2:20 PM Robinson DEL wrote: Caller/Agency: Stephanie/[facility] Callback Number: [phone] Secure Line Service Requested: Occupational Therapy Frequency: 1 week 6 Any new concerns about the patient? No

## 2024-02-03 ENCOUNTER — Encounter: Payer: Self-pay | Admitting: Internal Medicine

## 2024-02-03 ENCOUNTER — Telehealth (INDEPENDENT_AMBULATORY_CARE_PROVIDER_SITE_OTHER): Admitting: Internal Medicine

## 2024-02-03 DIAGNOSIS — F339 Major depressive disorder, recurrent, unspecified: Secondary | ICD-10-CM

## 2024-02-03 DIAGNOSIS — I69398 Other sequelae of cerebral infarction: Secondary | ICD-10-CM | POA: Diagnosis not present

## 2024-02-03 DIAGNOSIS — R269 Unspecified abnormalities of gait and mobility: Secondary | ICD-10-CM

## 2024-02-03 DIAGNOSIS — R5382 Chronic fatigue, unspecified: Secondary | ICD-10-CM

## 2024-02-03 MED ORDER — ESCITALOPRAM OXALATE 5 MG PO TABS: 10.0000 mg | ORAL_TABLET | Freq: Every day | ORAL | 1 refills | Status: AC

## 2024-02-03 NOTE — Assessment & Plan Note (Addendum)
 Worse PT/OT at home - [facility]

## 2024-02-03 NOTE — Assessment & Plan Note (Signed)
 Less depressed on Lexapro  Increase Lexapro  to 10 mg/d

## 2024-02-03 NOTE — Assessment & Plan Note (Signed)
 Check OTC urine test

## 2024-02-03 NOTE — Progress Notes (Signed)
 Virtual Visit via Video Note  I connected with Jason Hawkins on 02/03/24 at  2:40 PM EDT by a video enabled telemedicine application and verified that I am speaking with the correct person using two identifiers.   I discussed the limitations of evaluation and management by telemedicine and the availability of in person appointments. The patient expressed understanding and agreed to proceed.  I was located at our [facility] office. The patient was at home. Leesa and [name] were present in the visit.  Chief Complaint  Patient presents with   Medical Management of Chronic Issues     History of Present Illness:  C/o being weak and tired Less depressed on Lexapro   Review of Systems  Constitutional:  Positive for malaise/fatigue.  Respiratory:  Positive for shortness of breath.   Genitourinary:  Negative for frequency and urgency.  Musculoskeletal:  Positive for falls and joint pain. Negative for back pain.  Neurological:  Negative for seizures.  Psychiatric/Behavioral:  Positive for depression. Negative for hallucinations, memory loss and suicidal ideas.    Incontinent  Observations/Objective: The patient is tired after shower  Assessment and Plan:  Problem List Items Addressed This Visit     Depression   Less depressed on Lexapro  Increase Lexapro  to 10 mg/d      Relevant Medications   escitalopram  (LEXAPRO ) 5 MG tablet   Fatigue   Check OTC urine test      Gait disturbance, post-stroke - Primary   Worse PT/OT at home [facility]        Meds ordered this encounter  Medications   escitalopram  (LEXAPRO ) 5 MG tablet    Sig: Take 2 tablets (10 mg total) by mouth daily.    Dispense:  180 tablet    Refill:  1     Follow Up Instructions:    I discussed the assessment and treatment plan with the patient. The patient was provided an opportunity to ask questions and all were answered. The patient agreed with the plan and demonstrated an understanding of the  instructions.   The patient was advised to call back or seek an in-person evaluation if the symptoms worsen or if the condition fails to improve as anticipated.  I provided face-to-face time during this encounter. We were at different locations.   Marolyn Noel, MD

## 2024-02-05 NOTE — Telephone Encounter (Signed)
 Okay.  Thanks.

## 2024-02-06 DIAGNOSIS — U099 Post covid-19 condition, unspecified: Secondary | ICD-10-CM | POA: Diagnosis not present

## 2024-02-06 DIAGNOSIS — I482 Chronic atrial fibrillation, unspecified: Secondary | ICD-10-CM | POA: Diagnosis not present

## 2024-02-06 DIAGNOSIS — L299 Pruritus, unspecified: Secondary | ICD-10-CM | POA: Diagnosis not present

## 2024-02-06 DIAGNOSIS — R5382 Chronic fatigue, unspecified: Secondary | ICD-10-CM | POA: Diagnosis not present

## 2024-02-06 DIAGNOSIS — I10 Essential (primary) hypertension: Secondary | ICD-10-CM | POA: Diagnosis not present

## 2024-02-06 DIAGNOSIS — I69315 Cognitive social or emotional deficit following cerebral infarction: Secondary | ICD-10-CM | POA: Diagnosis not present

## 2024-02-09 ENCOUNTER — Encounter: Payer: Self-pay | Admitting: Internal Medicine

## 2024-02-10 DIAGNOSIS — R5382 Chronic fatigue, unspecified: Secondary | ICD-10-CM | POA: Diagnosis not present

## 2024-02-10 DIAGNOSIS — I482 Chronic atrial fibrillation, unspecified: Secondary | ICD-10-CM | POA: Diagnosis not present

## 2024-02-10 DIAGNOSIS — I69315 Cognitive social or emotional deficit following cerebral infarction: Secondary | ICD-10-CM | POA: Diagnosis not present

## 2024-02-10 DIAGNOSIS — I10 Essential (primary) hypertension: Secondary | ICD-10-CM | POA: Diagnosis not present

## 2024-02-10 DIAGNOSIS — U099 Post covid-19 condition, unspecified: Secondary | ICD-10-CM | POA: Diagnosis not present

## 2024-02-10 DIAGNOSIS — L299 Pruritus, unspecified: Secondary | ICD-10-CM | POA: Diagnosis not present

## 2024-02-10 NOTE — Telephone Encounter (Signed)
 LDVM for Jason Hawkins/[facility] with Verbal ok for Service Requested: Occupational Therapy Frequency: 1 week 6

## 2024-02-11 ENCOUNTER — Telehealth: Payer: Self-pay

## 2024-02-11 DIAGNOSIS — I10 Essential (primary) hypertension: Secondary | ICD-10-CM | POA: Diagnosis not present

## 2024-02-11 DIAGNOSIS — R5382 Chronic fatigue, unspecified: Secondary | ICD-10-CM | POA: Diagnosis not present

## 2024-02-11 DIAGNOSIS — I482 Chronic atrial fibrillation, unspecified: Secondary | ICD-10-CM | POA: Diagnosis not present

## 2024-02-11 DIAGNOSIS — I69315 Cognitive social or emotional deficit following cerebral infarction: Secondary | ICD-10-CM | POA: Diagnosis not present

## 2024-02-11 DIAGNOSIS — U099 Post covid-19 condition, unspecified: Secondary | ICD-10-CM | POA: Diagnosis not present

## 2024-02-11 DIAGNOSIS — L299 Pruritus, unspecified: Secondary | ICD-10-CM | POA: Diagnosis not present

## 2024-02-12 MED ORDER — APIXABAN 2.5 MG PO TABS: 2.5000 mg | ORAL_TABLET | Freq: Two times a day (BID) | ORAL | 3 refills | Status: DC

## 2024-02-13 NOTE — Telephone Encounter (Signed)
 Found form regarding lexapro  PA, placed on [facility] desk

## 2024-02-13 NOTE — Telephone Encounter (Signed)
 Copied from CRM [id number]. Topic: Clinical - Medication Prior Auth >> Feb 13, 2024 10:09 AM Ivette P wrote: Reason for CRM: [name] called in about the medication apixaban  (ELIQUIS ) 2.5 MG TABS tablet,  pharmacy has been trying to contact provider and had unsuccessful attempts and have cancelled the refill request.   Annitta is requesting to speak to nurse about situation, and is very upset.   Called [facility] and spoke to Morgan who said the print off was sent  and nurse was with pt.    [name] callback [phone]

## 2024-02-14 ENCOUNTER — Other Ambulatory Visit: Payer: Self-pay

## 2024-02-14 DIAGNOSIS — Z7901 Long term (current) use of anticoagulants: Secondary | ICD-10-CM

## 2024-02-14 MED ORDER — APIXABAN 2.5 MG PO TABS: 2.5000 mg | ORAL_TABLET | Freq: Two times a day (BID) | ORAL | 3 refills | Status: AC

## 2024-02-14 NOTE — Telephone Encounter (Signed)
 Copied from CRM [id number]. Topic: General - Other >> Feb 14, 2024  9:27 AM Berneda FALCON wrote: Reason for CRM: Daughter Annitta is calling in to return a phone call. From the notes, I let her know the medication was called in. She requests that we take a look at the MyChart message she sent to help gain better understanding of the situation and that we may need to adjust his chart to ensure it all matches.

## 2024-02-14 NOTE — Telephone Encounter (Signed)
 Pts medication has been re-sent to pharmacy.

## 2024-02-17 DIAGNOSIS — I739 Peripheral vascular disease, unspecified: Secondary | ICD-10-CM | POA: Diagnosis not present

## 2024-02-17 DIAGNOSIS — I10 Essential (primary) hypertension: Secondary | ICD-10-CM | POA: Diagnosis not present

## 2024-02-17 DIAGNOSIS — L299 Pruritus, unspecified: Secondary | ICD-10-CM | POA: Diagnosis not present

## 2024-02-17 DIAGNOSIS — I69315 Cognitive social or emotional deficit following cerebral infarction: Secondary | ICD-10-CM | POA: Diagnosis not present

## 2024-02-17 DIAGNOSIS — U099 Post covid-19 condition, unspecified: Secondary | ICD-10-CM | POA: Diagnosis not present

## 2024-02-17 DIAGNOSIS — R269 Unspecified abnormalities of gait and mobility: Secondary | ICD-10-CM | POA: Diagnosis not present

## 2024-02-17 DIAGNOSIS — G8929 Other chronic pain: Secondary | ICD-10-CM | POA: Diagnosis not present

## 2024-02-17 DIAGNOSIS — F32A Depression, unspecified: Secondary | ICD-10-CM | POA: Diagnosis not present

## 2024-02-17 DIAGNOSIS — I872 Venous insufficiency (chronic) (peripheral): Secondary | ICD-10-CM | POA: Diagnosis not present

## 2024-02-17 DIAGNOSIS — M25551 Pain in right hip: Secondary | ICD-10-CM | POA: Diagnosis not present

## 2024-02-17 DIAGNOSIS — I482 Chronic atrial fibrillation, unspecified: Secondary | ICD-10-CM | POA: Diagnosis not present

## 2024-02-17 DIAGNOSIS — R5382 Chronic fatigue, unspecified: Secondary | ICD-10-CM | POA: Diagnosis not present

## 2024-02-18 ENCOUNTER — Other Ambulatory Visit (HOSPITAL_COMMUNITY): Payer: Self-pay

## 2024-02-18 ENCOUNTER — Telehealth: Payer: Self-pay

## 2024-02-18 NOTE — Telephone Encounter (Signed)
 Pharmacy Patient Advocate Encounter   Received notification from Patient Advice Request messages that prior authorization for Escitalopram  5mg  tabs is required/requested.   Insurance verification completed.   The patient is insured through [facility] .   Per test claim: Refill too soon. PA is not needed at this time. Medication was filled 02/17/24. Next eligible fill date is 03/21/24.   Could the Escitalopram  5mg  be changed to Escitalopram  10mg  Take 1 tablet by mouth once daily?

## 2024-02-26 DIAGNOSIS — R5382 Chronic fatigue, unspecified: Secondary | ICD-10-CM | POA: Diagnosis not present

## 2024-02-26 DIAGNOSIS — I482 Chronic atrial fibrillation, unspecified: Secondary | ICD-10-CM | POA: Diagnosis not present

## 2024-02-26 DIAGNOSIS — U099 Post covid-19 condition, unspecified: Secondary | ICD-10-CM | POA: Diagnosis not present

## 2024-02-26 DIAGNOSIS — L299 Pruritus, unspecified: Secondary | ICD-10-CM | POA: Diagnosis not present

## 2024-02-26 DIAGNOSIS — I10 Essential (primary) hypertension: Secondary | ICD-10-CM | POA: Diagnosis not present

## 2024-02-26 DIAGNOSIS — I69315 Cognitive social or emotional deficit following cerebral infarction: Secondary | ICD-10-CM | POA: Diagnosis not present

## 2024-02-27 DIAGNOSIS — I69315 Cognitive social or emotional deficit following cerebral infarction: Secondary | ICD-10-CM | POA: Diagnosis not present

## 2024-02-27 DIAGNOSIS — I482 Chronic atrial fibrillation, unspecified: Secondary | ICD-10-CM | POA: Diagnosis not present

## 2024-02-27 DIAGNOSIS — U099 Post covid-19 condition, unspecified: Secondary | ICD-10-CM | POA: Diagnosis not present

## 2024-02-27 DIAGNOSIS — L299 Pruritus, unspecified: Secondary | ICD-10-CM | POA: Diagnosis not present

## 2024-02-27 DIAGNOSIS — R5382 Chronic fatigue, unspecified: Secondary | ICD-10-CM | POA: Diagnosis not present

## 2024-02-27 DIAGNOSIS — I10 Essential (primary) hypertension: Secondary | ICD-10-CM | POA: Diagnosis not present

## 2024-02-28 DIAGNOSIS — G8929 Other chronic pain: Secondary | ICD-10-CM | POA: Diagnosis not present

## 2024-02-28 DIAGNOSIS — Z91148 Patient's other noncompliance with medication regimen for other reason: Secondary | ICD-10-CM | POA: Diagnosis not present

## 2024-02-28 DIAGNOSIS — Z7901 Long term (current) use of anticoagulants: Secondary | ICD-10-CM | POA: Diagnosis not present

## 2024-02-28 DIAGNOSIS — Z952 Presence of prosthetic heart valve: Secondary | ICD-10-CM | POA: Diagnosis not present

## 2024-02-28 DIAGNOSIS — G441 Vascular headache, not elsewhere classified: Secondary | ICD-10-CM | POA: Diagnosis not present

## 2024-02-28 DIAGNOSIS — U099 Post covid-19 condition, unspecified: Secondary | ICD-10-CM | POA: Diagnosis not present

## 2024-02-28 DIAGNOSIS — I872 Venous insufficiency (chronic) (peripheral): Secondary | ICD-10-CM | POA: Diagnosis not present

## 2024-02-28 DIAGNOSIS — Z602 Problems related to living alone: Secondary | ICD-10-CM | POA: Diagnosis not present

## 2024-02-28 DIAGNOSIS — L299 Pruritus, unspecified: Secondary | ICD-10-CM | POA: Diagnosis not present

## 2024-02-28 DIAGNOSIS — R413 Other amnesia: Secondary | ICD-10-CM | POA: Diagnosis not present

## 2024-02-28 DIAGNOSIS — R269 Unspecified abnormalities of gait and mobility: Secondary | ICD-10-CM | POA: Diagnosis not present

## 2024-02-28 DIAGNOSIS — F32A Depression, unspecified: Secondary | ICD-10-CM | POA: Diagnosis not present

## 2024-02-28 DIAGNOSIS — E785 Hyperlipidemia, unspecified: Secondary | ICD-10-CM | POA: Diagnosis not present

## 2024-02-28 DIAGNOSIS — H6090 Unspecified otitis externa, unspecified ear: Secondary | ICD-10-CM | POA: Diagnosis not present

## 2024-02-28 DIAGNOSIS — R5382 Chronic fatigue, unspecified: Secondary | ICD-10-CM | POA: Diagnosis not present

## 2024-02-28 DIAGNOSIS — H6690 Otitis media, unspecified, unspecified ear: Secondary | ICD-10-CM | POA: Diagnosis not present

## 2024-02-28 DIAGNOSIS — I739 Peripheral vascular disease, unspecified: Secondary | ICD-10-CM | POA: Diagnosis not present

## 2024-02-28 DIAGNOSIS — Z5982 Transportation insecurity: Secondary | ICD-10-CM | POA: Diagnosis not present

## 2024-02-28 DIAGNOSIS — I69315 Cognitive social or emotional deficit following cerebral infarction: Secondary | ICD-10-CM | POA: Diagnosis not present

## 2024-02-28 DIAGNOSIS — M25551 Pain in right hip: Secondary | ICD-10-CM | POA: Diagnosis not present

## 2024-02-28 DIAGNOSIS — Z955 Presence of coronary angioplasty implant and graft: Secondary | ICD-10-CM | POA: Diagnosis not present

## 2024-02-28 DIAGNOSIS — Z66 Do not resuscitate: Secondary | ICD-10-CM | POA: Diagnosis not present

## 2024-02-28 DIAGNOSIS — R319 Hematuria, unspecified: Secondary | ICD-10-CM | POA: Diagnosis not present

## 2024-02-28 DIAGNOSIS — I482 Chronic atrial fibrillation, unspecified: Secondary | ICD-10-CM | POA: Diagnosis not present

## 2024-02-28 DIAGNOSIS — I10 Essential (primary) hypertension: Secondary | ICD-10-CM | POA: Diagnosis not present

## 2024-03-17 DIAGNOSIS — I69315 Cognitive social or emotional deficit following cerebral infarction: Secondary | ICD-10-CM | POA: Diagnosis not present

## 2024-03-17 DIAGNOSIS — R5382 Chronic fatigue, unspecified: Secondary | ICD-10-CM | POA: Diagnosis not present

## 2024-03-17 DIAGNOSIS — I482 Chronic atrial fibrillation, unspecified: Secondary | ICD-10-CM | POA: Diagnosis not present

## 2024-03-17 DIAGNOSIS — I10 Essential (primary) hypertension: Secondary | ICD-10-CM | POA: Diagnosis not present

## 2024-03-17 DIAGNOSIS — U099 Post covid-19 condition, unspecified: Secondary | ICD-10-CM | POA: Diagnosis not present

## 2024-03-17 DIAGNOSIS — L299 Pruritus, unspecified: Secondary | ICD-10-CM | POA: Diagnosis not present

## 2024-03-18 DIAGNOSIS — R5382 Chronic fatigue, unspecified: Secondary | ICD-10-CM | POA: Diagnosis not present

## 2024-03-18 DIAGNOSIS — U099 Post covid-19 condition, unspecified: Secondary | ICD-10-CM | POA: Diagnosis not present

## 2024-03-18 DIAGNOSIS — I10 Essential (primary) hypertension: Secondary | ICD-10-CM | POA: Diagnosis not present

## 2024-03-18 DIAGNOSIS — I69315 Cognitive social or emotional deficit following cerebral infarction: Secondary | ICD-10-CM | POA: Diagnosis not present

## 2024-03-18 DIAGNOSIS — I482 Chronic atrial fibrillation, unspecified: Secondary | ICD-10-CM | POA: Diagnosis not present

## 2024-03-18 DIAGNOSIS — L299 Pruritus, unspecified: Secondary | ICD-10-CM | POA: Diagnosis not present

## 2024-03-26 DIAGNOSIS — I482 Chronic atrial fibrillation, unspecified: Secondary | ICD-10-CM | POA: Diagnosis not present

## 2024-03-26 DIAGNOSIS — R5382 Chronic fatigue, unspecified: Secondary | ICD-10-CM | POA: Diagnosis not present

## 2024-03-26 DIAGNOSIS — I69315 Cognitive social or emotional deficit following cerebral infarction: Secondary | ICD-10-CM | POA: Diagnosis not present

## 2024-03-26 DIAGNOSIS — L299 Pruritus, unspecified: Secondary | ICD-10-CM | POA: Diagnosis not present

## 2024-03-26 DIAGNOSIS — U099 Post covid-19 condition, unspecified: Secondary | ICD-10-CM | POA: Diagnosis not present

## 2024-03-26 DIAGNOSIS — I10 Essential (primary) hypertension: Secondary | ICD-10-CM | POA: Diagnosis not present

## 2024-03-29 DIAGNOSIS — G8929 Other chronic pain: Secondary | ICD-10-CM | POA: Diagnosis not present

## 2024-03-29 DIAGNOSIS — Z7901 Long term (current) use of anticoagulants: Secondary | ICD-10-CM | POA: Diagnosis not present

## 2024-03-29 DIAGNOSIS — E785 Hyperlipidemia, unspecified: Secondary | ICD-10-CM | POA: Diagnosis not present

## 2024-03-29 DIAGNOSIS — Z952 Presence of prosthetic heart valve: Secondary | ICD-10-CM | POA: Diagnosis not present

## 2024-03-29 DIAGNOSIS — I69315 Cognitive social or emotional deficit following cerebral infarction: Secondary | ICD-10-CM | POA: Diagnosis not present

## 2024-03-29 DIAGNOSIS — Z8546 Personal history of malignant neoplasm of prostate: Secondary | ICD-10-CM | POA: Diagnosis not present

## 2024-03-29 DIAGNOSIS — Z9181 History of falling: Secondary | ICD-10-CM | POA: Diagnosis not present

## 2024-03-29 DIAGNOSIS — R413 Other amnesia: Secondary | ICD-10-CM | POA: Diagnosis not present

## 2024-03-29 DIAGNOSIS — Z5982 Transportation insecurity: Secondary | ICD-10-CM | POA: Diagnosis not present

## 2024-03-29 DIAGNOSIS — H6090 Unspecified otitis externa, unspecified ear: Secondary | ICD-10-CM | POA: Diagnosis not present

## 2024-03-29 DIAGNOSIS — M25551 Pain in right hip: Secondary | ICD-10-CM | POA: Diagnosis not present

## 2024-03-29 DIAGNOSIS — R319 Hematuria, unspecified: Secondary | ICD-10-CM | POA: Diagnosis not present

## 2024-03-29 DIAGNOSIS — F32A Depression, unspecified: Secondary | ICD-10-CM | POA: Diagnosis not present

## 2024-03-29 DIAGNOSIS — R5382 Chronic fatigue, unspecified: Secondary | ICD-10-CM | POA: Diagnosis not present

## 2024-03-29 DIAGNOSIS — H6690 Otitis media, unspecified, unspecified ear: Secondary | ICD-10-CM | POA: Diagnosis not present

## 2024-03-29 DIAGNOSIS — Z955 Presence of coronary angioplasty implant and graft: Secondary | ICD-10-CM | POA: Diagnosis not present

## 2024-03-29 DIAGNOSIS — L299 Pruritus, unspecified: Secondary | ICD-10-CM | POA: Diagnosis not present

## 2024-03-29 DIAGNOSIS — I10 Essential (primary) hypertension: Secondary | ICD-10-CM | POA: Diagnosis not present

## 2024-03-29 DIAGNOSIS — I482 Chronic atrial fibrillation, unspecified: Secondary | ICD-10-CM | POA: Diagnosis not present

## 2024-03-29 DIAGNOSIS — I872 Venous insufficiency (chronic) (peripheral): Secondary | ICD-10-CM | POA: Diagnosis not present

## 2024-03-29 DIAGNOSIS — Z602 Problems related to living alone: Secondary | ICD-10-CM | POA: Diagnosis not present

## 2024-03-29 DIAGNOSIS — U099 Post covid-19 condition, unspecified: Secondary | ICD-10-CM | POA: Diagnosis not present

## 2024-03-29 DIAGNOSIS — G441 Vascular headache, not elsewhere classified: Secondary | ICD-10-CM | POA: Diagnosis not present

## 2024-03-29 DIAGNOSIS — I739 Peripheral vascular disease, unspecified: Secondary | ICD-10-CM | POA: Diagnosis not present

## 2024-04-01 DIAGNOSIS — I69315 Cognitive social or emotional deficit following cerebral infarction: Secondary | ICD-10-CM | POA: Diagnosis not present

## 2024-04-01 DIAGNOSIS — I10 Essential (primary) hypertension: Secondary | ICD-10-CM | POA: Diagnosis not present

## 2024-04-01 DIAGNOSIS — R5382 Chronic fatigue, unspecified: Secondary | ICD-10-CM | POA: Diagnosis not present

## 2024-04-01 DIAGNOSIS — I482 Chronic atrial fibrillation, unspecified: Secondary | ICD-10-CM | POA: Diagnosis not present

## 2024-04-01 DIAGNOSIS — U099 Post covid-19 condition, unspecified: Secondary | ICD-10-CM | POA: Diagnosis not present

## 2024-04-01 DIAGNOSIS — L299 Pruritus, unspecified: Secondary | ICD-10-CM | POA: Diagnosis not present

## 2024-04-07 DIAGNOSIS — F32A Depression, unspecified: Secondary | ICD-10-CM | POA: Diagnosis not present

## 2024-04-07 DIAGNOSIS — U099 Post covid-19 condition, unspecified: Secondary | ICD-10-CM | POA: Diagnosis not present

## 2024-04-07 DIAGNOSIS — G441 Vascular headache, not elsewhere classified: Secondary | ICD-10-CM | POA: Diagnosis not present

## 2024-04-07 DIAGNOSIS — I872 Venous insufficiency (chronic) (peripheral): Secondary | ICD-10-CM | POA: Diagnosis not present

## 2024-04-07 DIAGNOSIS — I69315 Cognitive social or emotional deficit following cerebral infarction: Secondary | ICD-10-CM | POA: Diagnosis not present

## 2024-04-07 DIAGNOSIS — I739 Peripheral vascular disease, unspecified: Secondary | ICD-10-CM | POA: Diagnosis not present

## 2024-04-07 DIAGNOSIS — G8929 Other chronic pain: Secondary | ICD-10-CM | POA: Diagnosis not present

## 2024-04-07 DIAGNOSIS — I482 Chronic atrial fibrillation, unspecified: Secondary | ICD-10-CM | POA: Diagnosis not present

## 2024-04-07 DIAGNOSIS — M25551 Pain in right hip: Secondary | ICD-10-CM | POA: Diagnosis not present

## 2024-04-07 DIAGNOSIS — R5382 Chronic fatigue, unspecified: Secondary | ICD-10-CM | POA: Diagnosis not present

## 2024-04-07 DIAGNOSIS — I10 Essential (primary) hypertension: Secondary | ICD-10-CM | POA: Diagnosis not present

## 2024-04-07 DIAGNOSIS — L299 Pruritus, unspecified: Secondary | ICD-10-CM | POA: Diagnosis not present

## 2024-04-08 DIAGNOSIS — I69315 Cognitive social or emotional deficit following cerebral infarction: Secondary | ICD-10-CM | POA: Diagnosis not present

## 2024-04-08 DIAGNOSIS — I482 Chronic atrial fibrillation, unspecified: Secondary | ICD-10-CM | POA: Diagnosis not present

## 2024-04-08 DIAGNOSIS — I10 Essential (primary) hypertension: Secondary | ICD-10-CM | POA: Diagnosis not present

## 2024-04-08 DIAGNOSIS — L299 Pruritus, unspecified: Secondary | ICD-10-CM | POA: Diagnosis not present

## 2024-04-08 DIAGNOSIS — U099 Post covid-19 condition, unspecified: Secondary | ICD-10-CM | POA: Diagnosis not present

## 2024-04-08 DIAGNOSIS — R5382 Chronic fatigue, unspecified: Secondary | ICD-10-CM | POA: Diagnosis not present

## 2024-04-15 DIAGNOSIS — I482 Chronic atrial fibrillation, unspecified: Secondary | ICD-10-CM | POA: Diagnosis not present

## 2024-04-15 DIAGNOSIS — U099 Post covid-19 condition, unspecified: Secondary | ICD-10-CM | POA: Diagnosis not present

## 2024-04-15 DIAGNOSIS — I69315 Cognitive social or emotional deficit following cerebral infarction: Secondary | ICD-10-CM | POA: Diagnosis not present

## 2024-04-15 DIAGNOSIS — I10 Essential (primary) hypertension: Secondary | ICD-10-CM | POA: Diagnosis not present

## 2024-04-15 DIAGNOSIS — L299 Pruritus, unspecified: Secondary | ICD-10-CM | POA: Diagnosis not present

## 2024-04-15 DIAGNOSIS — R5382 Chronic fatigue, unspecified: Secondary | ICD-10-CM | POA: Diagnosis not present

## 2024-04-20 DIAGNOSIS — I10 Essential (primary) hypertension: Secondary | ICD-10-CM | POA: Diagnosis not present

## 2024-04-20 DIAGNOSIS — L299 Pruritus, unspecified: Secondary | ICD-10-CM | POA: Diagnosis not present

## 2024-04-20 DIAGNOSIS — U099 Post covid-19 condition, unspecified: Secondary | ICD-10-CM | POA: Diagnosis not present

## 2024-04-20 DIAGNOSIS — R5382 Chronic fatigue, unspecified: Secondary | ICD-10-CM | POA: Diagnosis not present

## 2024-04-20 DIAGNOSIS — I482 Chronic atrial fibrillation, unspecified: Secondary | ICD-10-CM | POA: Diagnosis not present

## 2024-04-20 DIAGNOSIS — I69315 Cognitive social or emotional deficit following cerebral infarction: Secondary | ICD-10-CM | POA: Diagnosis not present

## 2024-05-11 ENCOUNTER — Other Ambulatory Visit (HOSPITAL_COMMUNITY): Payer: Self-pay

## 2024-05-11 DIAGNOSIS — F0154 Vascular dementia, unspecified severity, with anxiety: Secondary | ICD-10-CM | POA: Diagnosis not present

## 2024-05-11 DIAGNOSIS — Z8546 Personal history of malignant neoplasm of prostate: Secondary | ICD-10-CM | POA: Diagnosis not present

## 2024-05-11 DIAGNOSIS — I10 Essential (primary) hypertension: Secondary | ICD-10-CM | POA: Diagnosis not present

## 2024-05-11 DIAGNOSIS — I482 Chronic atrial fibrillation, unspecified: Secondary | ICD-10-CM | POA: Diagnosis not present

## 2024-05-11 DIAGNOSIS — Z8673 Personal history of transient ischemic attack (TIA), and cerebral infarction without residual deficits: Secondary | ICD-10-CM | POA: Diagnosis not present

## 2024-05-11 DIAGNOSIS — F01511 Vascular dementia, unspecified severity, with agitation: Secondary | ICD-10-CM | POA: Diagnosis not present

## 2024-05-11 DIAGNOSIS — Z7901 Long term (current) use of anticoagulants: Secondary | ICD-10-CM | POA: Diagnosis not present

## 2024-05-11 DIAGNOSIS — Z952 Presence of prosthetic heart valve: Secondary | ICD-10-CM | POA: Diagnosis not present

## 2024-05-11 DIAGNOSIS — R634 Abnormal weight loss: Secondary | ICD-10-CM | POA: Diagnosis not present

## 2024-05-11 MED ORDER — SENNOSIDES 8.6 MG PO TABS
1.0000 | ORAL_TABLET | Freq: Two times a day (BID) | ORAL | 0 refills | Status: AC | PRN
  Filled 2024-05-11: qty 30, 4d supply, fill #0

## 2024-05-11 MED ORDER — MORPHINE SULFATE (CONCENTRATE) 10 MG /0.5 ML PO SOLN
5.0000 mg | ORAL | 0 refills | Status: AC | PRN
  Filled 2024-05-11: qty 30, 20d supply, fill #0

## 2024-05-11 MED ORDER — HALOPERIDOL 1 MG PO TABS
1.0000 mg | ORAL_TABLET | ORAL | 0 refills | Status: AC | PRN
  Filled 2024-05-11: qty 5, 1d supply, fill #0

## 2024-05-11 MED ORDER — LORAZEPAM 0.5 MG PO TABS
0.5000 mg | ORAL_TABLET | Freq: Three times a day (TID) | ORAL | 0 refills | Status: AC | PRN
  Filled 2024-05-11: qty 6, 2d supply, fill #0

## 2024-05-13 DIAGNOSIS — F01511 Vascular dementia, unspecified severity, with agitation: Secondary | ICD-10-CM | POA: Diagnosis not present

## 2024-05-13 DIAGNOSIS — Z7901 Long term (current) use of anticoagulants: Secondary | ICD-10-CM | POA: Diagnosis not present

## 2024-05-13 DIAGNOSIS — Z8673 Personal history of transient ischemic attack (TIA), and cerebral infarction without residual deficits: Secondary | ICD-10-CM | POA: Diagnosis not present

## 2024-05-13 DIAGNOSIS — I482 Chronic atrial fibrillation, unspecified: Secondary | ICD-10-CM | POA: Diagnosis not present

## 2024-05-13 DIAGNOSIS — I10 Essential (primary) hypertension: Secondary | ICD-10-CM | POA: Diagnosis not present

## 2024-05-13 DIAGNOSIS — Z952 Presence of prosthetic heart valve: Secondary | ICD-10-CM | POA: Diagnosis not present

## 2024-05-14 ENCOUNTER — Other Ambulatory Visit (HOSPITAL_COMMUNITY): Payer: Self-pay

## 2024-05-20 DIAGNOSIS — F01511 Vascular dementia, unspecified severity, with agitation: Secondary | ICD-10-CM | POA: Diagnosis not present

## 2024-05-20 DIAGNOSIS — I482 Chronic atrial fibrillation, unspecified: Secondary | ICD-10-CM | POA: Diagnosis not present

## 2024-05-20 DIAGNOSIS — Z952 Presence of prosthetic heart valve: Secondary | ICD-10-CM | POA: Diagnosis not present

## 2024-05-20 DIAGNOSIS — I10 Essential (primary) hypertension: Secondary | ICD-10-CM | POA: Diagnosis not present

## 2024-05-20 DIAGNOSIS — Z7901 Long term (current) use of anticoagulants: Secondary | ICD-10-CM | POA: Diagnosis not present

## 2024-05-20 DIAGNOSIS — Z8673 Personal history of transient ischemic attack (TIA), and cerebral infarction without residual deficits: Secondary | ICD-10-CM | POA: Diagnosis not present

## 2024-05-26 DIAGNOSIS — Z8673 Personal history of transient ischemic attack (TIA), and cerebral infarction without residual deficits: Secondary | ICD-10-CM | POA: Diagnosis not present

## 2024-05-26 DIAGNOSIS — I10 Essential (primary) hypertension: Secondary | ICD-10-CM | POA: Diagnosis not present

## 2024-05-26 DIAGNOSIS — F01511 Vascular dementia, unspecified severity, with agitation: Secondary | ICD-10-CM | POA: Diagnosis not present

## 2024-05-26 DIAGNOSIS — Z952 Presence of prosthetic heart valve: Secondary | ICD-10-CM | POA: Diagnosis not present

## 2024-05-26 DIAGNOSIS — I482 Chronic atrial fibrillation, unspecified: Secondary | ICD-10-CM | POA: Diagnosis not present

## 2024-05-26 DIAGNOSIS — Z7901 Long term (current) use of anticoagulants: Secondary | ICD-10-CM | POA: Diagnosis not present

## 2024-06-04 DIAGNOSIS — I10 Essential (primary) hypertension: Secondary | ICD-10-CM | POA: Diagnosis not present

## 2024-06-04 DIAGNOSIS — Z7901 Long term (current) use of anticoagulants: Secondary | ICD-10-CM | POA: Diagnosis not present

## 2024-06-04 DIAGNOSIS — I482 Chronic atrial fibrillation, unspecified: Secondary | ICD-10-CM | POA: Diagnosis not present

## 2024-06-04 DIAGNOSIS — F01511 Vascular dementia, unspecified severity, with agitation: Secondary | ICD-10-CM | POA: Diagnosis not present

## 2024-06-04 DIAGNOSIS — Z952 Presence of prosthetic heart valve: Secondary | ICD-10-CM | POA: Diagnosis not present

## 2024-06-04 DIAGNOSIS — Z8673 Personal history of transient ischemic attack (TIA), and cerebral infarction without residual deficits: Secondary | ICD-10-CM | POA: Diagnosis not present

## 2024-06-06 DIAGNOSIS — I10 Essential (primary) hypertension: Secondary | ICD-10-CM | POA: Diagnosis not present

## 2024-06-06 DIAGNOSIS — Z8673 Personal history of transient ischemic attack (TIA), and cerebral infarction without residual deficits: Secondary | ICD-10-CM | POA: Diagnosis not present

## 2024-06-06 DIAGNOSIS — R634 Abnormal weight loss: Secondary | ICD-10-CM | POA: Diagnosis not present

## 2024-06-06 DIAGNOSIS — Z952 Presence of prosthetic heart valve: Secondary | ICD-10-CM | POA: Diagnosis not present

## 2024-06-06 DIAGNOSIS — F0154 Vascular dementia, unspecified severity, with anxiety: Secondary | ICD-10-CM | POA: Diagnosis not present

## 2024-06-06 DIAGNOSIS — Z7901 Long term (current) use of anticoagulants: Secondary | ICD-10-CM | POA: Diagnosis not present

## 2024-06-06 DIAGNOSIS — Z8546 Personal history of malignant neoplasm of prostate: Secondary | ICD-10-CM | POA: Diagnosis not present

## 2024-06-06 DIAGNOSIS — F01511 Vascular dementia, unspecified severity, with agitation: Secondary | ICD-10-CM | POA: Diagnosis not present

## 2024-06-06 DIAGNOSIS — I482 Chronic atrial fibrillation, unspecified: Secondary | ICD-10-CM | POA: Diagnosis not present

## 2024-06-11 DIAGNOSIS — Z8673 Personal history of transient ischemic attack (TIA), and cerebral infarction without residual deficits: Secondary | ICD-10-CM | POA: Diagnosis not present

## 2024-06-11 DIAGNOSIS — I482 Chronic atrial fibrillation, unspecified: Secondary | ICD-10-CM | POA: Diagnosis not present

## 2024-06-11 DIAGNOSIS — I10 Essential (primary) hypertension: Secondary | ICD-10-CM | POA: Diagnosis not present

## 2024-06-11 DIAGNOSIS — Z7901 Long term (current) use of anticoagulants: Secondary | ICD-10-CM | POA: Diagnosis not present

## 2024-06-11 DIAGNOSIS — F01511 Vascular dementia, unspecified severity, with agitation: Secondary | ICD-10-CM | POA: Diagnosis not present

## 2024-06-11 DIAGNOSIS — Z952 Presence of prosthetic heart valve: Secondary | ICD-10-CM | POA: Diagnosis not present

## 2024-06-17 DIAGNOSIS — Z8673 Personal history of transient ischemic attack (TIA), and cerebral infarction without residual deficits: Secondary | ICD-10-CM | POA: Diagnosis not present

## 2024-06-17 DIAGNOSIS — I482 Chronic atrial fibrillation, unspecified: Secondary | ICD-10-CM | POA: Diagnosis not present

## 2024-06-17 DIAGNOSIS — Z952 Presence of prosthetic heart valve: Secondary | ICD-10-CM | POA: Diagnosis not present

## 2024-06-17 DIAGNOSIS — Z7901 Long term (current) use of anticoagulants: Secondary | ICD-10-CM | POA: Diagnosis not present

## 2024-06-17 DIAGNOSIS — I10 Essential (primary) hypertension: Secondary | ICD-10-CM | POA: Diagnosis not present

## 2024-06-17 DIAGNOSIS — F01511 Vascular dementia, unspecified severity, with agitation: Secondary | ICD-10-CM | POA: Diagnosis not present

## 2024-06-18 DIAGNOSIS — I10 Essential (primary) hypertension: Secondary | ICD-10-CM | POA: Diagnosis not present

## 2024-06-18 DIAGNOSIS — Z952 Presence of prosthetic heart valve: Secondary | ICD-10-CM | POA: Diagnosis not present

## 2024-06-18 DIAGNOSIS — F01511 Vascular dementia, unspecified severity, with agitation: Secondary | ICD-10-CM | POA: Diagnosis not present

## 2024-06-18 DIAGNOSIS — I482 Chronic atrial fibrillation, unspecified: Secondary | ICD-10-CM | POA: Diagnosis not present

## 2024-06-18 DIAGNOSIS — Z7901 Long term (current) use of anticoagulants: Secondary | ICD-10-CM | POA: Diagnosis not present

## 2024-06-18 DIAGNOSIS — Z8673 Personal history of transient ischemic attack (TIA), and cerebral infarction without residual deficits: Secondary | ICD-10-CM | POA: Diagnosis not present

## 2024-06-25 DIAGNOSIS — I482 Chronic atrial fibrillation, unspecified: Secondary | ICD-10-CM | POA: Diagnosis not present

## 2024-06-25 DIAGNOSIS — Z8673 Personal history of transient ischemic attack (TIA), and cerebral infarction without residual deficits: Secondary | ICD-10-CM | POA: Diagnosis not present

## 2024-06-25 DIAGNOSIS — Z7901 Long term (current) use of anticoagulants: Secondary | ICD-10-CM | POA: Diagnosis not present

## 2024-06-25 DIAGNOSIS — I10 Essential (primary) hypertension: Secondary | ICD-10-CM | POA: Diagnosis not present

## 2024-06-25 DIAGNOSIS — Z952 Presence of prosthetic heart valve: Secondary | ICD-10-CM | POA: Diagnosis not present

## 2024-06-25 DIAGNOSIS — F01511 Vascular dementia, unspecified severity, with agitation: Secondary | ICD-10-CM | POA: Diagnosis not present

## 2024-06-29 ENCOUNTER — Other Ambulatory Visit (HOSPITAL_BASED_OUTPATIENT_CLINIC_OR_DEPARTMENT_OTHER): Payer: Self-pay

## 2024-06-29 MED ORDER — CHLORASEPTIC 1.4 % MT LIQD: OROMUCOSAL | 0 refills | Status: AC

## 2024-06-30 ENCOUNTER — Other Ambulatory Visit (HOSPITAL_BASED_OUTPATIENT_CLINIC_OR_DEPARTMENT_OTHER): Payer: Self-pay

## 2024-06-30 DIAGNOSIS — Z7901 Long term (current) use of anticoagulants: Secondary | ICD-10-CM | POA: Diagnosis not present

## 2024-06-30 DIAGNOSIS — Z952 Presence of prosthetic heart valve: Secondary | ICD-10-CM | POA: Diagnosis not present

## 2024-06-30 DIAGNOSIS — I10 Essential (primary) hypertension: Secondary | ICD-10-CM | POA: Diagnosis not present

## 2024-06-30 DIAGNOSIS — F01511 Vascular dementia, unspecified severity, with agitation: Secondary | ICD-10-CM | POA: Diagnosis not present

## 2024-06-30 DIAGNOSIS — I482 Chronic atrial fibrillation, unspecified: Secondary | ICD-10-CM | POA: Diagnosis not present

## 2024-06-30 DIAGNOSIS — Z8673 Personal history of transient ischemic attack (TIA), and cerebral infarction without residual deficits: Secondary | ICD-10-CM | POA: Diagnosis not present

## 2024-07-01 ENCOUNTER — Emergency Department (HOSPITAL_COMMUNITY)
Admission: EM | Admit: 2024-07-01 | Discharge: 2024-07-01 | Disposition: A | Discharge status: Home / Self Care | Attending: Emergency Medicine | Admitting: Emergency Medicine

## 2024-07-01 ENCOUNTER — Other Ambulatory Visit: Payer: Self-pay

## 2024-07-01 ENCOUNTER — Encounter (HOSPITAL_COMMUNITY): Payer: Self-pay | Admitting: Emergency Medicine

## 2024-07-01 DIAGNOSIS — I482 Chronic atrial fibrillation, unspecified: Secondary | ICD-10-CM | POA: Diagnosis not present

## 2024-07-01 DIAGNOSIS — W19XXXA Unspecified fall, initial encounter: Secondary | ICD-10-CM | POA: Insufficient documentation

## 2024-07-01 DIAGNOSIS — F01511 Vascular dementia, unspecified severity, with agitation: Secondary | ICD-10-CM | POA: Diagnosis not present

## 2024-07-01 DIAGNOSIS — I10 Essential (primary) hypertension: Secondary | ICD-10-CM | POA: Diagnosis not present

## 2024-07-01 DIAGNOSIS — F0390 Unspecified dementia without behavioral disturbance: Secondary | ICD-10-CM | POA: Insufficient documentation

## 2024-07-01 DIAGNOSIS — Z952 Presence of prosthetic heart valve: Secondary | ICD-10-CM | POA: Diagnosis not present

## 2024-07-01 DIAGNOSIS — Z7901 Long term (current) use of anticoagulants: Secondary | ICD-10-CM | POA: Insufficient documentation

## 2024-07-01 DIAGNOSIS — S01311A Laceration without foreign body of right ear, initial encounter: Secondary | ICD-10-CM | POA: Insufficient documentation

## 2024-07-01 DIAGNOSIS — Z8673 Personal history of transient ischemic attack (TIA), and cerebral infarction without residual deficits: Secondary | ICD-10-CM | POA: Diagnosis not present

## 2024-07-01 MED ORDER — LIDOCAINE-EPINEPHRINE (PF) 2 %-1:200000 IJ SOLN
10.0000 mL | Freq: Once | INTRAMUSCULAR | Status: AC
  Administered 2024-07-01: 10 mL
  Filled 2024-07-01: qty 20

## 2024-07-01 NOTE — ED Provider Notes (Signed)
 [place] EMERGENCY DEPARTMENT AT [facility] Provider Note   CSN: [id number] Arrival date & time: 07/01/24  1100     Patient presents with: Jason Hawkins   Jason Hawkins is a 89 y.o. male.   HPI 89 year old male on Eliquis , on hospice care, presents today after fall last night.  Patient has been treated for bronchitis and is on antibiotics.  He may have some increased weakness.  His underlying dementia has had some increasing components.  He has cameras in place at home and his son is on the property.  His son is able to come fairly quickly after seeing that patient on the ground.  He was able to help him get back up.  Main injury seems to be a laceration to the right ear.  They were trying to clean that at home.  Patient is very hard of hearing.     Prior to Admission medications   Medication Sig Start Date End Date Taking? Authorizing Provider  albuterol  (VENTOLIN  HFA) 108 (90 Base) MCG/ACT inhaler Inhale 1-2 puffs into the lungs every 6 (six) hours as needed. 10/10/22   Jason Hawkins HERO, PA-C  apixaban  (ELIQUIS ) 2.5 MG TABS tablet Take 1 tablet (2.5 mg total) by mouth 2 (two) times daily. 02/14/24   Hawkins, Jason GAILS, MD  cetirizine  (ZYRTEC ) 10 MG tablet Take 1 tablet (10 mg total) by mouth daily. 08/15/22   Hawkins, Jason V, MD  Cholecalciferol (VITAMIN D3) 50 MCG (2000 UT) capsule Take 1 capsule (2,000 Units total) by mouth daily. 12/19/20   Hawkins, Jason V, MD  EPINEPHrine  (EPIPEN  2-PAK) 0.3 mg/0.3 mL IJ SOAJ injection Inject 0.3 mg into the muscle as needed for anaphylaxis. 05/07/23   Hawkins, Jason GAILS, MD  escitalopram  (LEXAPRO ) 5 MG tablet Take 2 tablets (10 mg total) by mouth daily. 02/03/24   Hawkins, Jason V, MD  haloperidol  (HALDOL ) 1 MG tablet Take 1 tablet (1 mg total) by mouth every 4 (four) hours as needed for agitation/nausea. 05/11/24     hydrOXYzine  (VISTARIL ) 25 MG capsule TAKE 1 OR 2 CAPSULES BY MOUTH AT BEDTIME AS NEEDED 11/20/22   Hawkins,  Jason GAILS, MD  loratadine  (CLARITIN ) 10 MG tablet Take 1 tablet (10 mg total) by mouth daily. 06/25/22   Hawkins, Jason V, MD  LORazepam  (ATIVAN ) 0.5 MG tablet Take 1 tablet (0.5 mg total) by mouth every 8 (eight) hours as needed for severe anxiety/shortness of breath not relieved by morphine . 05/11/24     methylPREDNISolone  (MEDROL  DOSEPAK) 4 MG TBPK tablet As directed 05/07/23   Hawkins, Jason V, MD  Morphine  Sulfate (MORPHINE  CONCENTRATE) 10 mg / 0.5 ml concentrated solution Take 0.25 mLs (5 mg total) by mouth every 4 (four) hours as needed for pain/shortness of breath. 05/11/24     Multiple Vitamin (MULTIVITAMIN) capsule Take 1 capsule by mouth daily.    [provider]  phenol (CHLORASEPTIC) 1.4 % LIQD Spray 1 spray into mouth every two hours as needed 06/29/24     polyethylene glycol powder (GLYCOLAX /MIRALAX ) 17 GM/SCOOP powder Take 17 g by mouth 2 (two) times daily as needed. 08/15/22   Hawkins, Jason V, MD  senna (SENOKOT) 8.6 MG tablet Take 1-4 tablets (8.6-34.4 mg total) by mouth 2 (two) times daily as needed for constipation. 05/11/24     Spacer/Aero-Holding Chambers DEVI Use with inhaler 10/10/22   Jason Hawkins HERO, PA-C  triamcinolone  cream (KENALOG ) 0.1 % Apply 1 Application topically 2 (two) times daily. 10/29/22   Hawkins, Jason V, MD  Allergies: Bee venom, Codeine, and Lisinopril     Review of Systems  Updated Vital Signs BP (!) 201/94 (BP Location: Right Arm)   Pulse 68   Temp 97.9 F (36.6 C) (Oral)   Resp 18   Ht 1.753 m (5' 9)   Wt 81 kg   SpO2 100%   BMI 26.37 kg/m   Physical Exam Vitals reviewed.  Constitutional:      General: He is not in acute distress.    Appearance: He is ill-appearing.  HENT:     Head: Normocephalic.     Comments: Right ear with laceration    Ears:     Comments: Laceration of right ear extending right face between tragus and antitragus    Nose: Nose normal.     Mouth/Throat:     Mouth: Mucous membranes are  moist.     Pharynx: Oropharynx is clear.  Eyes:     Pupils: Pupils are equal, round, and reactive to light.  Cardiovascular:     Rate and Rhythm: Normal rate.     Pulses: Normal pulses.  Pulmonary:     Effort: Pulmonary effort is normal.  Abdominal:     Palpations: Abdomen is soft.  Musculoskeletal:        General: Normal range of motion.     Cervical back: Normal range of motion.  Skin:    General: Skin is warm and dry.     Capillary Refill: Capillary refill takes less than 2 seconds.  Neurological:     Mental Status: He is alert.     (all labs ordered are listed, but only abnormal results are displayed) Labs Reviewed - No data to display  EKG: None  Radiology: No results found.   Procedures   Medications Ordered in the ED  lidocaine -EPINEPHrine  (XYLOCAINE  W/EPI) 2 %-1:200000 (PF) injection 10 mL (has no administration in time range)                                    Medical Decision Making  89 year old male with fall last night.  Presents today with ear laceration.  He is on hospice care for generalized demise.  Family friend, who has healthcare power of attorney is at bedside.  Discussed in depth goals of care and plan.  Patient does not want acute interventions.  Therefore, will not obtain any CT scans or lab work.  Here he is hypertensive but this has decreased here in the ED without intervention. Patient has significant laceration running between tracking of intertrigo.  This is cleaned and repaired with glue and Steri-Strips.  Please see note Jason Ruby, PA-C Patient has hospice at home. We did discuss that he can return at any time.  Patient appears stable for discharge     Final diagnoses:  Fall, initial encounter  Complex laceration of right ear, initial encounter    ED Discharge Orders     None          Jason Houston, MD 07/01/24 1259

## 2024-07-01 NOTE — Discharge Instructions (Addendum)
 You were seen and evaluated today for a fall.  A laceration of your right ear was repaired with Steri-Strips and glue.  You may use a dressing if there is any bleeding.  You can return anytime for reevaluation if ongoing problems such as increased bleeding, redness, swelling, or pain or other robs that you wish to address.

## 2024-07-01 NOTE — Progress Notes (Signed)
   This pt is currently enrolled under hospice care with [facility]. We plan to follow up on the pt after they return home this evening from ED. Thank you for assistance and help with this matter today.   Magdalena Berber RN [phone]

## 2024-07-01 NOTE — ED Triage Notes (Signed)
 Pt arrives with family friend who reports fall last night. Pts head landed beside nightstand and bed. Hospice nurse cleaned right ear up and sent over. Pt started on abx for bronchitis today. Pt extremely hard of hearing. Pt on hospice and family reports worsening AMS past few days. Hx of dementia.

## 2024-07-01 NOTE — ED Provider Notes (Signed)
 12:54 PM .Laceration Repair  Date/Time: 07/01/2024 12:55 PM  Performed by: Desiderio Chew, PA-C Authorized by: Desiderio Chew, PA-C   Consent:    Consent obtained:  Verbal   Consent given by:  Healthcare agent and patient   Risks discussed:  Pain and poor wound healing   Alternatives discussed:  No treatment Universal protocol:    Patient identity confirmed:  Provided demographic data and arm band Anesthesia:    Anesthesia method:  Local infiltration   Local anesthetic:  Lidocaine  2% WITH epi Laceration details:    Location:  Ear   Ear location:  R ear   Length (cm):  1.5 Exploration:    Imaging outcome: foreign body not noted     Wound exploration: wound explored through full range of motion and entire depth of wound visualized     Wound extent: no foreign body   Treatment:    Area cleansed with:  Shur-Clens   Amount of cleaning:  Standard   Debridement:  Minimal Skin repair:    Repair method:  Tissue adhesive Post-procedure details:    Procedure completion:  Tolerated well, no immediate complications Comments:     There is an abrasion anterior to the right ear on the face.  There was abraded and devitalized tissue from this abrasion/skin tear which was debrided in the anterior ear.  There is a deeper laceration measuring about 1.5 cm in the external auditory canal inferiorly.  This approximates well.  Given age and status, will perform closure with Dermabond.  Wound cleaned as best as possible.  Dermabond applied, no immediate complications.     Desiderio Chew, PA-C 07/01/24 1304    Levander Houston, MD 07/01/24 [phone]

## 2024-07-01 NOTE — Progress Notes (Signed)
   This pt is currently active with [facility]. The pt fell last night. His son found him this morning and unsure how long he had been laying on floor with him calling out for his son. He has been sick and nurse with hospice started on antibiotic yesterday (Azithromycin 250mg  X 5 days) for bronchitis. He has been more confuse dover past week. It appeared to her that he hit his right cheek and head on his night stand. The pt was having a great deal of pain noted and he would only allow the nurse to clean his ear minimally. He was on Eliquis  at home but this was stopped on 05/26/24 due to risk vs reward and pt getting weaker and possibly could fall. The son felt he needed to be seen at hospital.   He has in home for pain morphine  20mg /41ml that he can take PRN pain.   He has DNR in home.  This is a related hospitalization to hospice diagnosis which is vascular dementia   Magdalena Berber RN [phone]
